# Patient Record
Sex: Male | Born: 1945
Health system: Southern US, Community
[De-identification: ages and names within clinical notes are randomized; demographics above are authoritative.]

## PROBLEM LIST (undated history)

## (undated) DIAGNOSIS — E119 Type 2 diabetes mellitus without complications: Secondary | ICD-10-CM

## (undated) DIAGNOSIS — J449 Chronic obstructive pulmonary disease, unspecified: Secondary | ICD-10-CM

## (undated) DIAGNOSIS — E1142 Type 2 diabetes mellitus with diabetic polyneuropathy: Secondary | ICD-10-CM

## (undated) DIAGNOSIS — Z959 Presence of cardiac and vascular implant and graft, unspecified: Secondary | ICD-10-CM

## (undated) DIAGNOSIS — I209 Angina pectoris, unspecified: Secondary | ICD-10-CM

## (undated) DIAGNOSIS — I340 Nonrheumatic mitral (valve) insufficiency: Secondary | ICD-10-CM

## (undated) DIAGNOSIS — I739 Peripheral vascular disease, unspecified: Secondary | ICD-10-CM

## (undated) DIAGNOSIS — R011 Cardiac murmur, unspecified: Secondary | ICD-10-CM

## (undated) DIAGNOSIS — I219 Acute myocardial infarction, unspecified: Secondary | ICD-10-CM

## (undated) DIAGNOSIS — H269 Unspecified cataract: Secondary | ICD-10-CM

## (undated) DIAGNOSIS — E785 Hyperlipidemia, unspecified: Secondary | ICD-10-CM

## (undated) DIAGNOSIS — I2510 Atherosclerotic heart disease of native coronary artery without angina pectoris: Secondary | ICD-10-CM

## (undated) DIAGNOSIS — K219 Gastro-esophageal reflux disease without esophagitis: Secondary | ICD-10-CM

## (undated) DIAGNOSIS — N183 Chronic kidney disease, stage 3 (moderate): Secondary | ICD-10-CM

## (undated) DIAGNOSIS — I10 Essential (primary) hypertension: Secondary | ICD-10-CM

## (undated) DIAGNOSIS — B029 Zoster without complications: Secondary | ICD-10-CM

## (undated) HISTORY — DX: Nonrheumatic mitral (valve) insufficiency: I34.0

## (undated) HISTORY — PX: ANTERIOR CERVICAL DECOMP/DISCECTOMY FUSION: SHX1161

## (undated) HISTORY — DX: Type 2 diabetes mellitus with diabetic polyneuropathy: E11.42

## (undated) HISTORY — DX: Atherosclerotic heart disease of native coronary artery without angina pectoris: I25.10

## (undated) HISTORY — DX: Chronic obstructive pulmonary disease, unspecified: J44.9

## (undated) HISTORY — PX: CHOLECYSTECTOMY: SHX55

## (undated) HISTORY — PX: BACK SURGERY: SHX140

## (undated) HISTORY — DX: Zoster without complications: B02.9

## (undated) HISTORY — DX: Unspecified cataract: H26.9

## (undated) HISTORY — DX: Peripheral vascular disease, unspecified: I73.9

## (undated) HISTORY — DX: Hyperlipidemia, unspecified: E78.5

## (undated) HISTORY — DX: Chronic kidney disease, stage 3 (moderate): N18.3

---

## 1999-08-12 ENCOUNTER — Inpatient Hospital Stay (HOSPITAL_COMMUNITY): Admission: RE | Admit: 1999-08-12 | Discharge: 1999-08-15 | Payer: Self-pay | Admitting: Neurosurgery

## 1999-10-19 ENCOUNTER — Encounter: Admission: RE | Admit: 1999-10-19 | Discharge: 1999-10-19 | Payer: Self-pay | Admitting: Neurosurgery

## 1999-12-29 ENCOUNTER — Encounter: Payer: Self-pay | Admitting: Internal Medicine

## 1999-12-29 ENCOUNTER — Encounter: Admission: RE | Admit: 1999-12-29 | Discharge: 1999-12-29 | Payer: Self-pay | Admitting: Internal Medicine

## 2000-05-23 ENCOUNTER — Encounter: Admission: RE | Admit: 2000-05-23 | Discharge: 2000-05-23 | Payer: Self-pay | Admitting: Neurosurgery

## 2000-09-01 ENCOUNTER — Encounter: Admission: RE | Admit: 2000-09-01 | Discharge: 2000-09-01 | Payer: Self-pay | Admitting: Neurosurgery

## 2000-12-05 HISTORY — PX: LUMBAR FUSION: SHX111

## 2002-11-19 ENCOUNTER — Encounter: Payer: Self-pay | Admitting: Unknown Physician Specialty

## 2002-11-19 ENCOUNTER — Ambulatory Visit (HOSPITAL_COMMUNITY): Admission: RE | Admit: 2002-11-19 | Discharge: 2002-11-19 | Payer: Self-pay | Admitting: Unknown Physician Specialty

## 2002-12-05 HISTORY — PX: POSTERIOR FUSION CERVICAL SPINE: SUR628

## 2003-02-06 ENCOUNTER — Inpatient Hospital Stay (HOSPITAL_COMMUNITY): Admission: RE | Admit: 2003-02-06 | Discharge: 2003-02-08 | Payer: Self-pay | Admitting: Neurosurgery

## 2003-02-06 ENCOUNTER — Encounter: Payer: Self-pay | Admitting: General Surgery

## 2003-03-10 ENCOUNTER — Inpatient Hospital Stay (HOSPITAL_COMMUNITY): Admission: RE | Admit: 2003-03-10 | Discharge: 2003-04-02 | Payer: Self-pay | Admitting: Neurosurgery

## 2003-03-30 ENCOUNTER — Encounter: Payer: Self-pay | Admitting: Gastroenterology

## 2003-04-08 ENCOUNTER — Ambulatory Visit (HOSPITAL_COMMUNITY): Admission: RE | Admit: 2003-04-08 | Discharge: 2003-04-08 | Payer: Self-pay | Admitting: Neurosurgery

## 2003-05-07 ENCOUNTER — Ambulatory Visit (HOSPITAL_COMMUNITY): Admission: RE | Admit: 2003-05-07 | Discharge: 2003-05-07 | Payer: Self-pay | Admitting: Gastroenterology

## 2003-05-07 ENCOUNTER — Encounter: Payer: Self-pay | Admitting: Gastroenterology

## 2003-08-07 ENCOUNTER — Ambulatory Visit (HOSPITAL_COMMUNITY): Admission: RE | Admit: 2003-08-07 | Discharge: 2003-08-07 | Payer: Self-pay | Admitting: Gastroenterology

## 2003-08-07 ENCOUNTER — Encounter: Payer: Self-pay | Admitting: Gastroenterology

## 2005-03-14 ENCOUNTER — Encounter: Admission: RE | Admit: 2005-03-14 | Discharge: 2005-06-12 | Payer: Self-pay | Admitting: *Deleted

## 2006-02-06 ENCOUNTER — Emergency Department (HOSPITAL_COMMUNITY): Admission: EM | Admit: 2006-02-06 | Discharge: 2006-02-06 | Payer: Self-pay | Admitting: Emergency Medicine

## 2008-12-05 DIAGNOSIS — I219 Acute myocardial infarction, unspecified: Secondary | ICD-10-CM

## 2008-12-05 HISTORY — DX: Acute myocardial infarction, unspecified: I21.9

## 2009-04-04 HISTORY — PX: CORONARY ANGIOPLASTY WITH STENT PLACEMENT: SHX49

## 2009-04-11 ENCOUNTER — Ambulatory Visit: Payer: Self-pay | Admitting: Cardiology

## 2009-04-11 ENCOUNTER — Inpatient Hospital Stay (HOSPITAL_COMMUNITY): Admission: EM | Admit: 2009-04-11 | Discharge: 2009-04-14 | Payer: Self-pay | Admitting: Cardiology

## 2009-04-14 ENCOUNTER — Encounter: Payer: Self-pay | Admitting: Cardiology

## 2009-04-14 ENCOUNTER — Ambulatory Visit: Payer: Self-pay | Admitting: Vascular Surgery

## 2009-04-29 DIAGNOSIS — J449 Chronic obstructive pulmonary disease, unspecified: Secondary | ICD-10-CM

## 2009-04-29 DIAGNOSIS — J4489 Other specified chronic obstructive pulmonary disease: Secondary | ICD-10-CM | POA: Insufficient documentation

## 2009-04-30 ENCOUNTER — Ambulatory Visit: Payer: Self-pay | Admitting: Cardiovascular Disease

## 2009-04-30 ENCOUNTER — Encounter: Payer: Self-pay | Admitting: Nurse Practitioner

## 2009-04-30 DIAGNOSIS — Z789 Other specified health status: Secondary | ICD-10-CM

## 2009-04-30 DIAGNOSIS — F17200 Nicotine dependence, unspecified, uncomplicated: Secondary | ICD-10-CM

## 2009-04-30 DIAGNOSIS — I214 Non-ST elevation (NSTEMI) myocardial infarction: Secondary | ICD-10-CM | POA: Insufficient documentation

## 2009-05-06 ENCOUNTER — Ambulatory Visit: Payer: Self-pay | Admitting: Cardiology

## 2009-07-28 ENCOUNTER — Ambulatory Visit: Payer: Self-pay | Admitting: Cardiology

## 2009-08-04 ENCOUNTER — Ambulatory Visit: Payer: Self-pay | Admitting: Cardiology

## 2009-10-21 ENCOUNTER — Encounter (INDEPENDENT_AMBULATORY_CARE_PROVIDER_SITE_OTHER): Payer: Self-pay | Admitting: *Deleted

## 2009-11-06 ENCOUNTER — Encounter (INDEPENDENT_AMBULATORY_CARE_PROVIDER_SITE_OTHER): Payer: Self-pay | Admitting: *Deleted

## 2009-12-01 ENCOUNTER — Ambulatory Visit: Payer: Self-pay | Admitting: Cardiology

## 2009-12-11 ENCOUNTER — Ambulatory Visit: Payer: Self-pay | Admitting: Cardiology

## 2009-12-17 ENCOUNTER — Ambulatory Visit: Payer: Self-pay | Admitting: Cardiology

## 2009-12-22 ENCOUNTER — Encounter: Payer: Self-pay | Admitting: Cardiology

## 2009-12-22 LAB — CONVERTED CEMR LAB
ALT: 22 units/L (ref 0–53)
AST: 17 units/L (ref 0–37)
Albumin: 4.1 g/dL (ref 3.5–5.2)
Alkaline Phosphatase: 55 units/L (ref 39–117)
Cholesterol: 133 mg/dL (ref 0–200)
HDL: 35.9 mg/dL — ABNORMAL LOW (ref >39.00)
Total CHOL/HDL Ratio: 4
Triglycerides: 94 mg/dL (ref 0.0–149.0)

## 2010-01-05 HISTORY — PX: OTHER SURGICAL HISTORY: SHX169

## 2010-01-07 ENCOUNTER — Ambulatory Visit: Payer: Self-pay | Admitting: Cardiology

## 2010-01-07 ENCOUNTER — Encounter: Payer: Self-pay | Admitting: Cardiology

## 2010-03-02 ENCOUNTER — Encounter (INDEPENDENT_AMBULATORY_CARE_PROVIDER_SITE_OTHER): Payer: Self-pay | Admitting: *Deleted

## 2010-03-15 ENCOUNTER — Encounter (INDEPENDENT_AMBULATORY_CARE_PROVIDER_SITE_OTHER): Payer: Self-pay | Admitting: *Deleted

## 2010-06-15 ENCOUNTER — Encounter: Payer: Self-pay | Admitting: Cardiology

## 2010-11-04 HISTORY — PX: CARDIOVASCULAR STRESS TEST: SHX262

## 2010-11-08 ENCOUNTER — Ambulatory Visit: Payer: Self-pay | Admitting: Family Medicine

## 2010-11-08 DIAGNOSIS — J209 Acute bronchitis, unspecified: Secondary | ICD-10-CM | POA: Insufficient documentation

## 2010-11-08 DIAGNOSIS — M79609 Pain in unspecified limb: Secondary | ICD-10-CM | POA: Insufficient documentation

## 2010-11-09 ENCOUNTER — Encounter: Payer: Self-pay | Admitting: Gastroenterology

## 2010-11-09 ENCOUNTER — Telehealth (INDEPENDENT_AMBULATORY_CARE_PROVIDER_SITE_OTHER): Payer: Self-pay | Admitting: *Deleted

## 2010-11-09 ENCOUNTER — Encounter: Payer: Self-pay | Admitting: Family Medicine

## 2010-11-15 ENCOUNTER — Telehealth: Payer: Self-pay | Admitting: Cardiology

## 2010-11-15 ENCOUNTER — Telehealth: Payer: Self-pay | Admitting: Family Medicine

## 2010-11-19 ENCOUNTER — Ambulatory Visit: Payer: Self-pay | Admitting: Cardiology

## 2010-11-19 ENCOUNTER — Encounter: Payer: Self-pay | Admitting: Cardiology

## 2010-11-25 ENCOUNTER — Telehealth (INDEPENDENT_AMBULATORY_CARE_PROVIDER_SITE_OTHER): Payer: Self-pay | Admitting: Radiology

## 2010-11-30 ENCOUNTER — Encounter: Payer: Self-pay | Admitting: Cardiology

## 2010-11-30 ENCOUNTER — Encounter (HOSPITAL_COMMUNITY)
Admission: RE | Admit: 2010-11-30 | Discharge: 2011-01-04 | Payer: Self-pay | Source: Home / Self Care | Attending: Cardiology | Admitting: Cardiology

## 2010-11-30 ENCOUNTER — Ambulatory Visit: Payer: Self-pay

## 2010-12-05 HISTORY — PX: COLONOSCOPY: SHX174

## 2010-12-07 ENCOUNTER — Telehealth: Payer: Self-pay | Admitting: Family Medicine

## 2010-12-10 LAB — CONVERTED CEMR LAB
ALT: 15 units/L (ref 0–53)
Albumin: 4.2 g/dL (ref 3.5–5.2)
Cholesterol: 192 mg/dL (ref 0–200)
Eosinophils Relative: 1.9 % (ref 0.0–5.0)
GFR calc non Af Amer: 69.92 mL/min (ref >60.00)
HCT: 40.6 % (ref 39.0–52.0)
Hemoglobin: 13.8 g/dL (ref 13.0–17.0)
Hgb A1c MFr Bld: 8.5 % — ABNORMAL HIGH (ref 4.6–6.5)
LDL Cholesterol: 124 mg/dL — ABNORMAL HIGH (ref 0–99)
Lymphs Abs: 2 10*3/uL (ref 0.7–4.0)
Monocytes Relative: 7.1 % (ref 3.0–12.0)
Neutro Abs: 5 10*3/uL (ref 1.4–7.7)
Potassium: 4.9 meq/L (ref 3.5–5.1)
Sodium: 138 meq/L (ref 135–145)
Total Bilirubin: 1 mg/dL (ref 0.3–1.2)
VLDL: 33.6 mg/dL (ref 0.0–40.0)
WBC: 7.8 10*3/uL (ref 4.5–10.5)

## 2010-12-13 ENCOUNTER — Encounter (INDEPENDENT_AMBULATORY_CARE_PROVIDER_SITE_OTHER): Payer: Self-pay

## 2010-12-14 ENCOUNTER — Ambulatory Visit
Admission: RE | Admit: 2010-12-14 | Discharge: 2010-12-14 | Payer: Self-pay | Source: Home / Self Care | Attending: Gastroenterology | Admitting: Gastroenterology

## 2010-12-17 ENCOUNTER — Encounter: Payer: Self-pay | Admitting: Family Medicine

## 2010-12-28 ENCOUNTER — Ambulatory Visit
Admission: RE | Admit: 2010-12-28 | Discharge: 2010-12-28 | Payer: Self-pay | Source: Home / Self Care | Attending: Gastroenterology | Admitting: Gastroenterology

## 2010-12-28 ENCOUNTER — Encounter: Payer: Self-pay | Admitting: Gastroenterology

## 2010-12-28 LAB — HM COLONOSCOPY

## 2010-12-29 LAB — GLUCOSE, CAPILLARY
Glucose-Capillary: 154 mg/dL — ABNORMAL HIGH (ref 70–99)
Glucose-Capillary: 156 mg/dL — ABNORMAL HIGH (ref 70–99)

## 2011-01-04 NOTE — Progress Notes (Signed)
  Phone Note Other Incoming   Summary of Call: Please notify pt that his urine protein test yesterday was normal.  Also, tell him that I saw his labs from 10/14/10 and I recommend he try another cholesterol med since he couldn't tolerate his zocor.  I'll send rx for Welchol to his pharmacy and he should take it every day. Thanks Initial call taken by: Michell Heinrich M.D.,  November 09, 2010 1:39 PM  Follow-up for Phone Call        Will notify pt through a letter. Only have work number. Follow-up by: Josph Macho RMA,  November 09, 2010 2:05 PM

## 2011-01-04 NOTE — Miscellaneous (Signed)
  Clinical Lists Changes  Observations: Added new observation of RS STUDY: TRACER - study completion 01/07/10  (03/02/2010 12:15)      Research Study Name: TRACER - study completion 01/07/10

## 2011-01-04 NOTE — Letter (Signed)
Summary: Generic Letter  [facility] [address]   [place], [place] [address]   Phone: [phone]  Fax: [phone]     11/09/2010   Derrick [name] [address] [place], [place]  [address]  [place]  Dear [name],  We have tried to contact you but have been unsucessful. We would like you to know that your urine protein test yesterday was normal. Dr Milinda Cave would also like you to know that he saw your labs from 10/05/10 and would recommend you try another cholesterol medication since you couldn't tolerate the Zocor. He sent in an RX for Welchol to your pharmacy. You will need to take this everyday. I'm also sending you some information about the Welchol along with a savings card.  If you have any questions please don't hesitate to contact us at [phone]. Also please contact us with a current number to contact you.       Sincerely,   Josph Macho RMA

## 2011-01-04 NOTE — Letter (Signed)
Summary: lipid reminder  [facility], Main Office  [address]   [place], [place] [address]   Phone: [phone]  Fax: [phone]        June 15, 2010 MRN: [id number]    KIRT CHEW [address] [place], [place]  [address]    Dear Mr. COSENS,  Our records indicate it is time to check your cholesterol.  Please call our office to schedule an appt for labwork.  Please remember it is a fasting lab.      Sincerely,  Meredith Staggers, RN Willa Rough, MD This letter has been electronically signed by your physician.

## 2011-01-04 NOTE — Assessment & Plan Note (Signed)
Summary: New pt Medicare/dt   Vital Signs:  Patient profile:   65 year old male Height:      66 inches (167.64 cm) Weight:      128.75 pounds (58.52 kg) BMI:     20.86 O2 Sat:      95 % on Room air Temp:     98.7 degrees F (37.06 degrees C) oral Pulse rate:   89 / minute BP sitting:   111 / 74  (right arm) Cuff size:   regular  Vitals Entered By: Josph Macho RMA (November 08, 2010 1:01 PM)  O2 Flow:  Room air CC: Establish new patient/ CF Is Patient Diabetic? Yes   History of Present Illness: 65 y/o WM, here to establish care.   Last primary care f/u visit was nearly a year ago per his report.  His MD closed his office about 32mo ago (Dr. Day in [place], [place]) and he has not had some of his meds for months.  Of note, he self d/c'd his simvastatin due to myalgias.  These resolved with d/c of med. He's not checking glucoses at all but wife says he has a glucometer. Denies CP, SOB, or LE edema.  Had been on TRACER study drug with [facility] cardiology but now is finished with this.  Stopped plavix sometime after last cardiology f/u due to cost.  Has about 3 wks of URI, cough, describes "double sickening".  Some mild tightness in chest but no wheezing.  No fevers.  He feels like he's just not getting any better.  Still smoking 1 ppd of marlboro but wants to quit.  He was strongly encouraged to do so today while here.  Has chronic burning/aching pain in all of his toes for at least the last 5 yrs.  Apparently w/u at [facility] was unrevealing and he says 1/2 of a vicodin tab every evening takes the pain away.    Has a history of cervical and lumbar DDD and has had multiple surgeries on both areas, with stabilization hardware in c-spine.  Denies any neck, arm, leg, or back pain.  Med list reviewed: not taking naproxen, plavix, or simvastatin.  Lisinopril 2.5mg  is not on the list --his former primary MD added this since last update.   Medications Prior to Update: 1)  Metformin Hcl 500 Mg  Tabs (Metformin Hcl) .... Two Times A Day 2)  Glipizide 10 Mg Tabs (Glipizide) .... Daily 3)  Naprosyn 250 Mg Tabs (Naproxen) .... As Needed 4)  Vicodin 5-500 Mg Tabs (Hydrocodone-Acetaminophen) .... As Needed 5)  Tracer Study Drug 6)  Simvastatin 40 Mg Tabs (Simvastatin) .... Daily 7)  Plavix 75 Mg Tabs (Clopidogrel Bisulfate) .... Daily 8)  Aspirin 325 Mg  Tabs (Aspirin) .... Daily 9)  Nitrostat 0.4 Mg Subl (Nitroglycerin) .... As Needed  Current Medications (verified): 1)  Metformin Hcl 500 Mg Tabs (Metformin Hcl) .... Two Times A Day 2)  Glipizide 10 Mg Tabs (Glipizide) .... Daily 3)  Vicodin 5-500 Mg Tabs (Hydrocodone-Acetaminophen) .[name].. 1-2 Q 4 Hours As Needed 4)  Aspirin 325 Mg  Tabs (Aspirin) .... Daily 5)  Nitrostat 0.4 Mg Subl (Nitroglycerin) .... As Needed 6)  Lisinopril 2.5 Mg Tabs (Lisinopril) .... Once Daily 7)  Zithromax Z-Pak 250 Mg Tabs (Azithromycin) .... As Directed  Allergies (verified): No Known Drug Allergies  Past History:  Past Surgical History: L-spine surgery 2000 (Dr. Lovell Sheehan) C-spine surgery 2004 (Dr. Lovell Sheehan) --transient dysphagia requiring tube feeding after this procedure.  Family History: Reviewed  history from 04/29/2009 and no changes required. Family History of Coronary Artery Disease:   Social History: Reviewed history from 04/29/2009 and no changes required. Tobacco Use - Yes.  Alcohol Use - no Drug Use - no Married, has 3 grown children.  Works at [employer] in [place], [place].  Review of Systems  The patient denies anorexia, fever, weight loss, weight gain, vision loss, decreased hearing, hoarseness, chest pain, syncope, dyspnea on exertion, peripheral edema, prolonged cough, headaches, hemoptysis, abdominal pain, melena, hematochezia, severe indigestion/heartburn, hematuria, incontinence, genital sores, muscle weakness, suspicious skin lesions, transient blindness, difficulty walking, depression, unusual weight change,  abnormal bleeding, enlarged lymph nodes, angioedema, breast masses, and testicular masses.    Physical Exam  General:  VS: noted, all normal. Gen: Alert, well appearing, oriented x 4. HEENT: Scalp without lesions or hair loss.  Ears: EACs clear, normal epithelium.  TMs with good light reflex and landmarks bilaterally.  Eyes: no injection, icteris, swelling, or exudate.  EOMI, PERRLA. Nose: no drainage but mild mucosal injection noted.   Mouth: lips without lesion/swelling.  Oral mucosa pink and moist.  Dentition intact and without obvious caries or gingival swelling.  Oropharynx without erythema, exudate, or swelling.  Chest: symmetric expansion, with nonlabored respirations.  Clear and equal breath sounds in all lung fields.   Chest: symmetric expansion, with nonlabored respirations.  Clear and equal breath sounds in all lung fields.   Some post-exhalation coughing is noted.  CV: RRR, no m/r/g.  Peripheral pulses 2+/symmetric.    Impression & Recommendations:  Problem # 1:  BRONCHITIS, ACUTE (ICD-466.0) New patient. Has prolonged URI symptoms plus some bronchospasm, ongoing tobacco dependence. Encouraged smoking cessation, discussed nicotine replacement options today. Rx'd zithromax and gave ventolin HFA sample, 1-2 puffs q4h as needed.  His updated medication list for this problem includes:    Zithromax Z-pak 250 Mg Tabs (Azithromycin) .[name]... As directed  Problem # 2:  CORONARY ATHEROSCLEROSIS NATIVE CORONARY ARTERY (ICD-414.01) Doing well. Continue ASA 81-325mg  once daily. He'll f/u with his cardiologist soon and they'll decide on any need to restart plavix.  The following medications were removed from the medication list:    Plavix 75 Mg Tabs (Clopidogrel bisulfate) .[name]... Daily His updated medication list for this problem includes:    Aspirin 325 Mg Tabs (Aspirin) .[name]... Daily    Nitrostat 0.4 Mg Subl (Nitroglycerin) .[name]... As needed    Lisinopril 2.5 Mg Tabs (Lisinopril) .....  Once daily  Orders: T-Comprehensive Metabolic Panel [id number]) T-Lipid Profile [id number]) T-CBC w/Diff [id number]) T- Hemoglobin A1C ([id number])  Problem # 3:  DIABETES MELLITUS (ICD-250.00) Discussed need to monitor glucoses, recommended at least once daily fasting checks, bring in log of these to next visit.   In the meantime, will repeat urine protein-creatinine ratio today and gave order for him to get fasting lipids, CBC, CMET, and HbA1c at [facility] at his earliest convenience.  If lipids in need of med treatment, in light of his myalgias with simvastatin, will consider welchol. He is due for routine diabetic retinopathy screening so he'll call his eye MD to make appt. We did discuss the need for colon cancer screening, so we'll facilitate this referral. Of note, pt declined flu vaccine today.  His updated medication list for this problem includes:    Metformin Hcl 500 Mg Tabs (Metformin hcl) .[name]..[name] Two times a day    Glipizide 10 Mg Tabs (Glipizide) .[name]... Daily    Aspirin 325 Mg Tabs (Aspirin) .[name]... Daily    Lisinopril 2.5  Mg Tabs (Lisinopril) ..... Once daily  Orders: T-Urine Microalbumin w/creat. ratio [id number]) T-Comprehensive Metabolic Panel [id number]) T-Lipid Profile [id number]) T-CBC w/Diff [id number]) T- Hemoglobin A1C ([id number])  Problem # 4:  PAIN IN SOFT TISSUES OF LIMB (ICD-729.5) Suspicous for diabetic neuropathy.   Per pt report, w/u unrevealing at [facility].  Since 1/2 of vicodin 5/500 once daily is very effective for him we'll continue this.  Complete Medication List: 1)  Metformin Hcl 500 Mg Tabs (Metformin hcl) .... Two times a day 2)  Glipizide 10 Mg Tabs (Glipizide) .... Daily 3)  Vicodin 5-500 Mg Tabs (Hydrocodone-acetaminophen) .[name].. 1-2 q 4 hours as needed 4)  Aspirin 325 Mg Tabs (Aspirin) .... Daily 5)  Nitrostat 0.4 Mg Subl (Nitroglycerin) .... As needed 6)  Lisinopril 2.5 Mg Tabs (Lisinopril) .... Once daily 7)  Zithromax  Z-pak 250 Mg Tabs (Azithromycin) .... As directed  Patient Instructions: 1)  Please schedule a follow-up appointment in 4 months .  2)  Check glucose at least once daily in the morning before eating. Prescriptions: LISINOPRIL 2.5 MG TABS (LISINOPRIL) once daily  #30 x 5   Entered and Authorized by:   Michell Heinrich M.D.   Signed by:   Michell Heinrich M.D. on 11/08/2010   Method used:   Electronically to        [facility] [address] (retail)       [address]       [place], [place]  [address]       Ph: [phone]       Fax: [phone]   RxID:   [id number] GLIPIZIDE 10 MG TABS (GLIPIZIDE) daily  #30 x 5   Entered and Authorized by:   Michell Heinrich M.D.   Signed by:   Michell Heinrich M.D. on 11/08/2010   Method used:   Electronically to        [facility] [address] (retail)       [address]       [place], [place]  [address]       Ph: [phone]       Fax: [phone]   RxID:   [id number] METFORMIN HCL 500 MG TABS (METFORMIN HCL) two times a day  #60 x 5   Entered and Authorized by:   Michell Heinrich M.D.   Signed by:   Michell Heinrich M.D. on 11/08/2010   Method used:   Electronically to        [facility] [address] (retail)       [address]       [place], [place]  [address]       Ph: [phone]       Fax: [phone]   RxID:   [id number] ZITHROMAX Z-PAK 250 MG TABS (AZITHROMYCIN) as directed  #1 pack x 0   Entered and Authorized by:   Michell Heinrich M.D.   Signed by:   Michell Heinrich M.D. on 11/08/2010   Method used:   Faxed to ...       [facility] [address] (retail)       [address]       [place], [place]  [address]       Ph: [phone]  Fax: [phone]   RxID:   [id number] VICODIN 5-500 MG TABS (HYDROCODONE-ACETAMINOPHEN) as needed  #60 x 1   Entered and Authorized by:   Michell Heinrich M.D.   Signed by:   Michell Heinrich M.D. on 11/08/2010   Method used:   Print then Give to Patient   RxID:   [id number]    Orders Added: 1)  T-Urine Microalbumin w/creat. ratio [[id number]] 2)  T-Comprehensive Metabolic Panel [[id number]] 3)  T-Lipid Profile [[id number]] 4)  T-CBC w/Diff [[id number]] 5)  T- Hemoglobin A1C [83036-23375]     Appended Document: New pt Medicare/dt    Clinical Lists Changes  Problems: Added new problem of SCREENING, COLON CANCER (ICD-V76.51) Orders: Added new Referral order of Gastroenterology Referral (GI) - Signed Added new Service order of New Patient Level IV ([address]) - Signed

## 2011-01-04 NOTE — Assessment & Plan Note (Signed)
Summary: PER CHECK OUT/SF   Visit Type:  Follow-up Primary Provider:  Alfredia Client, MD  CC:  CAD.  History of Present Illness: Patient is seen for followup of coronary artery disease.  He had a non-STEMI May, 2010.  He is done well since then.  He has very slight chest discomfort which does not sound like angina.  Unfortunately he continues to smoke.  He said he has cut back to one half pack per day.  I formally counseled him about this and again incurred to stop smoking.    Current Medications (verified): 1)  Metformin Hcl 500 Mg Tabs (Metformin Hcl) .... Two Times A Day 2)  Glipizide 10 Mg Tabs (Glipizide) .... Daily 3)  Naprosyn 250 Mg Tabs (Naproxen) .... As Needed 4)  Vicodin 5-500 Mg Tabs (Hydrocodone-Acetaminophen) .... As Needed 5)  Tracer Study Drug 6)  Simvastatin 40 Mg Tabs (Simvastatin) .... Daily 7)  Plavix 75 Mg Tabs (Clopidogrel Bisulfate) .... Daily 8)  Aspirin 325 Mg  Tabs (Aspirin) .... Daily 9)  Nitrostat 0.4 Mg Subl (Nitroglycerin) .... As Needed  Allergies (verified): No Known Drug Allergies  Past History:  Past Medical History: Last updated: 04/30/2009 HYPERLIPIDEMIA (ICD-272.4) TOBACCO ABUSE (ICD-305.1) CORONARY ATHEROSCLEROSIS NATIVE CORONARY ARTERY (ICD-414.01)      A. S/P NSTEMI 04/11/2009      B. S/P CATH/PCI/DES (XIENCE) TO THE LAD. COPD (ICD-496) DIABETES MELLITUS (ICD-250.00)  Review of Systems       Patient denies fever, chills, headache, sweats, rash, change in vision, change in hearing, shortness of breath, nausea vomiting, urinary symptoms, musculoskeletal problems.  All other systems are reviewed and are negative.  Vital Signs:  Patient profile:   65 year old male Height:      66 inches Weight:      135 pounds BMI:     21.87 Pulse rate:   75 / minute BP sitting:   142 / 72  (left arm) Cuff size:   regular  Vitals Entered By: Hardin Negus, RMA (December 11, 2009 9:30 AM)  Physical Exam  General:  patient is stable in  general. Eyes:  no xanthelasma. Neck:  no jugular venous distention. Lungs:  lungs reveal a few end expiratory wheezes. Heart:  cardiac exam reveals S1 and S2.  No clicks or significant murmurs. Abdomen:  abdomen is soft. Extremities:  no peripheral edema. Psych:  patient is oriented to person time and place.  Affect is normal.   Impression & Recommendations:  Problem # 1:  HYPERLIPIDEMIA (ICD-272.4)  His updated medication list for this problem includes:    Simvastatin 40 Mg Tabs (Simvastatin) .[name]... Daily Lipids are treated.  No change in therapy.  We'll be sure that he has followup labs.  Problem # 2:  TOBACCO ABUSE (ICD-305.1) I counseled the patient to stop smoking.  He says he is trying.  Problem # 3:  CORONARY ATHEROSCLEROSIS NATIVE CORONARY ARTERY (ICD-414.01)  His updated medication list for this problem includes:    Plavix 75 Mg Tabs (Clopidogrel bisulfate) .[name]... Daily    Aspirin 325 Mg Tabs (Aspirin) .[name]... Daily    Nitrostat 0.4 Mg Subl (Nitroglycerin) .[name]... As needed  Orders: EKG w/ Interpretation (93[address])  Patient is not having significant ischemic pain.  EKG is reviewed by me.  It is done today.  There is normal sinus rhythm with no acute changes.Follow up will be in several months to reconsider the ongoing use of Plavix at that time.  Patient Instructions: 1)  Your physician recommends that  you return for a FASTING lipid and liver profile: next week, dx 414.01 2)  Follow up in June

## 2011-01-04 NOTE — Letter (Signed)
Summary: Pre Visit Letter Revised  [facility]  [address] [place], [place] [address]   Phone: [phone]  Fax: [phone]        11/09/2010 MRN: [id number]  Derrick [name] [address] [place], [place]  [address]  [place]             Procedure Date:  12-28-10 8:30am           Dr Russella Dar - Recall Colon   Welcome to the Gastroenterology Division at [facility].    You are scheduled to see a nurse for your pre-procedure visit on 12-14-09 at 1pm on the 3rd floor at [facility], [address]  We ask that you try to arrive at our office 15 minutes prior to your appointment time to allow for check-in.  Please take a minute to review the attached form.  If you answer "Yes" to one or more of the questions on the first page, we ask that you call the person listed at your earliest opportunity.  If you answer "No" to all of the questions, please complete the rest of the form and bring it to your appointment.    Your nurse visit will consist of discussing your medical and surgical history, your immediate family medical history, and your medications.   If you are unable to list all of your medications on the form, please bring the medication bottles to your appointment and we will list them.  We will need to be aware of both prescribed and over the counter drugs.  We will need to know exact dosage information as well.    Please be prepared to read and sign documents such as consent forms, a financial agreement, and acknowledgement forms.  If necessary, and with your consent, a friend or relative is welcome to sit-in on the nurse visit with you.  Please bring your insurance card so that we may make a copy of it.  If your insurance requires a referral to see a specialist, please bring your referral form from your primary care physician.  No co-pay is required for this nurse visit.     If you cannot keep your appointment, please call [phone] to cancel or reschedule prior to  your appointment date.  This allows [place] the opportunity to schedule an appointment for another patient in need of care.    Thank you for choosing [facility] for your medical needs.  We appreciate the opportunity to care for you.  Please visit [place] at our website  to learn more about our practice.  Sincerely, The Gastroenterology Division

## 2011-01-04 NOTE — Miscellaneous (Signed)
  Clinical Lists Changes  Medications: Added new medication of WELCHOL 625 MG TABS (COLESEVELAM HCL) 3 tabs by mouth two times a day with a meal - Signed Rx of WELCHOL 625 MG TABS (COLESEVELAM HCL) 3 tabs by mouth two times a day with a meal;  #180 x 6;  Signed;  Entered by: Michell Heinrich M.D.;  Authorized by: Michell Heinrich M.D.;  Method used: Electronically to [facility] [address], [place], [place], [place]  [address], Ph: [phone], Fax: [phone]    Prescriptions: WELCHOL 625 MG TABS (COLESEVELAM HCL) 3 tabs by mouth two times a day with a meal  #180 x 6   Entered and Authorized by:   Michell Heinrich M.D.   Signed by:   Michell Heinrich M.D. on 11/09/2010   Method used:   Electronically to        [facility] [address] (retail)       [address]       [place], [place]  [address]       Ph: [phone]       Fax: [phone]   RxID:   [id number]

## 2011-01-04 NOTE — Letter (Signed)
Summary: Custom - Lipid  [facility], Main Office  [address]   [place], [place] [address]   Phone: [phone]  Fax: [phone]     December 22, 2009 MRN: [id number]   Derrick [name] [address] [place], [place]  [address]   Dear Derrick [name],  We have reviewed your cholesterol results.  They are as follows:     Total Cholesterol:    133 (Desirable: less than 200)       HDL  Cholesterol:     35.90  (Desirable: greater than 40 for men and 50 for women)       LDL Cholesterol:       78  (Desirable: less than 100 for low risk and less than 70 for moderate to high risk)       Triglycerides:       94.0  (Desirable: less than 150)  Our recommendations include:  Looks good, continue current medications   Call our office at the number listed above if you have any questions.  Lowering your LDL cholesterol is important, but it is only one of a large number of "risk factors" that may indicate that you are at risk for heart disease, stroke or other complications of hardening of the arteries.  Other risk factors include:   A.  Cigarette Smoking* B.  High Blood Pressure* C.  Obesity* D.   Low HDL Cholesterol (see yours above)* E.   Diabetes Mellitus (higher risk if your is uncontrolled) F.  Family history of premature heart disease G.  Previous history of stroke or cardiovascular disease    *These are risk factors YOU HAVE CONTROL OVER.  For more information, visit .  There is now evidence that lowering the TOTAL CHOLESTEROL AND LDL CHOLESTEROL can reduce the risk of heart disease.  The American Heart Association recommends the following guidelines for the treatment of elevated cholesterol:  1.  If there is now current heart disease and less than two risk factors, TOTAL CHOLESTEROL should be less than 200 and LDL CHOLESTEROL should be less than 100. 2.  If there is current heart disease or two or more risk factors, TOTAL CHOLESTEROL should be less than 200 and LDL  CHOLESTEROL should be less than 70.  A diet low in cholesterol, saturated fat, and calories is the cornerstone of treatment for elevated cholesterol.  Cessation of smoking and exercise are also important in the management of elevated cholesterol and preventing vascular disease.  Studies have shown that 30 to 60 minutes of physical activity most days can help lower blood pressure, lower cholesterol, and keep your weight at a healthy level.  Drug therapy is used when cholesterol levels do not respond to therapeutic lifestyle changes (smoking cessation, diet, and exercise) and remains unacceptably high.  If medication is started, it is important to have you levels checked periodically to evaluate the need for further treatment options.  Thank you,    [facility] Team

## 2011-01-04 NOTE — Miscellaneous (Signed)
  Clinical Lists Changes  Observations: Added new observation of PAST SURG HX: L-spine surgery 2000 (Dr. Lovell Sheehan) C-spine surgery 2004 (Dr. Lovell Sheehan) --transient dysphagia requiring tube feeding after this procedure. 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 Lovell Sheehan) C-spine surgery 2004 (Dr. Lovell Sheehan) --transient dysphagia requiring tube feeding after this procedure. 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

## 2011-01-04 NOTE — Letter (Signed)
Summary: Appointment - Reminder 2  [facility], Main Office  [address]   [place], [place] [address]   Phone: [phone]  Fax: [phone]     March 15, 2010 MRN: [id number]   Derrick [name] [address] [place], [place]  [address]   Dear [name],  Our records indicate that it is time to schedule a follow-up appointment with Dr. Myrtis Ser. It is very important that we reach you to schedule this appointment. We look forward to participating in your health care needs. Please contact us at the number listed above at your earliest convenience to schedule your appointment.  If you are unable to make an appointment at this time, give [place] a call so we can update our records.     Sincerely,   Glass blower/designer

## 2011-01-05 ENCOUNTER — Telehealth: Payer: Self-pay | Admitting: Family Medicine

## 2011-01-06 NOTE — Medication Information (Signed)
Summary: [employer]  [employer]   Imported By: Lester [place] 12/02/2010 08:00:21  _____________________________________________________________________  External Attachment:    Type:   Image     Comment:   External Document

## 2011-01-06 NOTE — Progress Notes (Signed)
Summary: tal to nurse (chest pain)  Phone Note From Other Clinic Call back at Home Phone [phone]   Caller: nurse Erie Noe Summary of Call: Per Erie Noe from [place] [facility]. States pt has been having chest pain off and on since Thanksgiving. Pt is at home now and called and wanted to see Primary Care dr. PCP feels pt needs to see cardiologist. pain was in center of chest but now is to the side. Please call pt and discuss what is going on with pt and to see if he needs to be seen or sent to ER.  Initial call taken by: Edman Circle,  November 15, 2010 9:30 AM  Follow-up for Phone Call        only have work # listed for pt, they state he is gone to lunch, will attempt to call him back later Meredith Staggers, RN  November 15, 2010 11:32 AM   pt home # was added have left mess there for pt to call back, have also contacted his work again and they state he is gone for the day Meredith Staggers, RN  November 15, 2010 5:01 PM   Additional Follow-up for Phone Call Additional follow up Details #1::        spoke w/pts wife appt sch for Fri w/Dr Boykin Nearing, RN  November 16, 2010 9:44 AM

## 2011-01-06 NOTE — Progress Notes (Signed)
Summary: HGB A1C results  Phone Note Other Incoming   Summary of Call: Please notify pt that his HbA1c was up (8.5%).  I recommend he increase his metformin to 1000 mg twice daily.  Finish up the 500 mg tabs by taking 2 tabs two times a day.  I'll send eRx for the 1000 mg tabs to his pharmacy.  Continue taking all other meds.  F/u as scheduled (should be 3-4 months from now) Initial call taken by: Michell Heinrich M.D.,  December 07, 2010 8:21 AM  Follow-up for Phone Call        I spoke to pt and notified to begin taking 1000mg  two times a day.  Pt agreeable.  Pt states that he feels "run down" and "dizzy-headed" for about one hour after taking metformin.  Pt takes ASA, but no other meds with metformin.  Unsure if pt is taking on full or empty stomach.  Pt would like to know if there is anything he can do to prevent these symptoms after taking metformin.  Please advise. Follow-up by: Francee Piccolo CMA Duncan Dull),  December 07, 2010 10:57 AM  Additional Follow-up for Phone Call Additional follow up Details #1::        Try taking with food every time, and he's still having the symptoms or if the increased dose worsens the symptoms, then he should go 2-3 days without metformin at all.  If the symptoms go away when he stops it, we'll rx a different med altogether. Additional Follow-up by: Michell Heinrich M.D.,  December 07, 2010 11:30 AM    Additional Follow-up for Phone Call Additional follow up Details #2::    pt notified of above and is agreeable. Follow-up by: Francee Piccolo CMA Duncan Dull),  December 07, 2010 2:24 PM

## 2011-01-06 NOTE — Procedures (Signed)
Summary: Colonoscopy  Patient: Derrick [name] Note: All result statuses are Final unless otherwise noted.  Tests: (1) Colonoscopy (COL)   COL Colonoscopy           DONE     [facility]     [address]     [place], [place]  [address]           COLONOSCOPY PROCEDURE REPORT     PATIENT:  Derrick, [name]  MR#:  [id number]     BIRTHDATE:  [birth year], 64 yrs. old  GENDER:  male     ENDOSCOPIST:  Judie Petit T. Russella Dar, MD, [facility]           PROCEDURE DATE:  12/28/2010     PROCEDURE:  Colonoscopy 84696     ASA CLASS:  Class II     INDICATIONS:  1) Routine Risk Screening     MEDICATIONS:   Fentanyl 50 mcg IV, Versed 5 mg IV     DESCRIPTION OF PROCEDURE:   After the risks benefits and     alternatives of the procedure were thoroughly explained, informed     consent was obtained.  Digital rectal exam was performed and     revealed no abnormalities.   The LB [id number] endoscope     was introduced through the anus and advanced to the cecum, which     was identified by both the appendix and ileocecal valve, without     limitations.  The quality of the prep was excellent, using     MoviPrep.  The instrument was then slowly withdrawn as the colon     was fully examined.     <<PROCEDUREIMAGES>>     FINDINGS:  A normal appearing cecum, ileocecal valve, and     appendiceal orifice were identified. The ascending, hepatic     flexure, transverse, splenic flexure, descending, sigmoid colon,     and rectum appeared unremarkable.   Retroflexed views in the     rectum revealed no abnormalities. The time to cecum =  2  minutes.     The scope was then withdrawn (time =  9.67  min) from the patient     and the procedure completed.     COMPLICATIONS:  None           ENDOSCOPIC IMPRESSION:     1) Normal colon           RECOMMENDATIONS:     1) Continue current colorectal screening for "routine risk"     patients with a colonoscopy in 10 years.           Venita Lick. Russella Dar, MD, [facility]         CC: Nicoletta Ba, MD           n.     Rosalie DoctorVenita Lick. Tiffney Haughton at 12/28/2010 08:53 AM           Ronny Flurry, [id number]  Note: An exclamation mark (!) indicates a result that was not dispersed into the flowsheet. Document Creation Date: 12/28/2010 8:54 AM _______________________________________________________________________  (1) Order result status: Final Collection or observation date-time: 12/28/2010 08:50 Requested date-time:  Receipt date-time:  Reported date-time:  Referring Physician:   Ordering Physician: Claudette Head [phone]) Specimen Source:  Source: Launa Grill Order Number: [id number] Lab site:   Appended Document: Colonoscopy    Clinical Lists Changes  Observations: Added new observation of COLONNXTDUE: 12/2020 (12/28/2010 15:09)

## 2011-01-06 NOTE — Progress Notes (Signed)
Summary: Chest Pain  ---- Converted from flag ---- ---- 11/15/2010 9:31 AM, Elizebeth Brooking McGowen M.D. wrote: Needs to f/u with Dr. Myrtis Ser ASAP.  He also has the option of coming in to see me in the meantime, and remind him that he can ALWAYS go to the ED if his chest pain is getting worse.  If his wife wants me to I'll call and talk to him.  Let me know.  ---- 11/15/2010 9:20 AM, Georga Bora wrote: Patient has had chest pains on and off since Thanksgiving. ------------------------------  Phone Note Other Incoming   Summary of Call: I called [facility] to schedule patient appointment. They will call patient today. Patient's wife was informed that Cardiology will call her home to speak with her husband (Patient). Initial call taken by: Georga Bora,  November 15, 2010 11:01 AM

## 2011-01-06 NOTE — Progress Notes (Signed)
Summary: nuc pre-procedure  Phone Note Outgoing Call   Call placed by: Domenic Polite, CNMT,  November 25, 2010 2:57 PM Call placed to: Patient Reason for Call: Confirm/change Appt Summary of Call: Reviewed information on Myoview Information Sheet (see scanned document for further details).  Spoke with patient.      Nuclear Med Background Indications for Stress Test: Evaluation for Ischemia, Stent Patency   History: COPD, Heart Catheterization, Myocardial Infarction, Stents  History Comments: 5/10 MI -nstemi /cath /stent LAD  Symptoms: Chest Pain    Nuclear Pre-Procedure Cardiac Risk Factors: Lipids, NIDDM, Smoker Height (in): 66

## 2011-01-06 NOTE — Assessment & Plan Note (Signed)
Summary: per spouse call pt having CP off and on/lg   Visit Type:  Follow-up Primary Provider:  Michell Heinrich M.D.  CC:  CAD.  History of Present Illness: the patient is seen for follow up of coronary artery disease.  I saw him last January, 2011.  He received daily drug-eluting stent to the LAD in May, 2010.  He is well.  He has some localized left anterior chest pain that is random.  This is not like his original severe sharp anterior chest pain with his MI.  He is active.  Is not having shortness of breath.  Current Medications (verified): 1)  Metformin Hcl 500 Mg Tabs (Metformin Hcl) .... Two Times A Day 2)  Glipizide 10 Mg Tabs (Glipizide) .... Daily 3)  Vicodin 5-500 Mg Tabs (Hydrocodone-Acetaminophen) .[name].. 1-2 Q 4 Hours As Needed 4)  Aspirin 325 Mg  Tabs (Aspirin) .... Daily 5)  Nitrostat 0.4 Mg Subl (Nitroglycerin) .... As Needed 6)  Lisinopril 2.5 Mg Tabs (Lisinopril) .... Once Daily 7)  Welchol 625 Mg Tabs (Colesevelam Hcl) .... 3 Tabs By Mouth Two Times A Day With A Meal  Allergies (verified): 1)  ! Simvastatin  Past History:  Past Medical History: HYPERLIPIDEMIA (ICD-272.4) TOBACCO ABUSE (ICD-305.1) CORONARY ATHEROSCLEROSIS NATIVE CORONARY ARTERY (ICD-414.01)      A. S/P NSTEMI 04/11/2009      B. S/P CATH/PCI/DES (XIENCE) TO THE LAD. COPD (ICD-496) DIABETES MELLITUS (ICD-250.00) Hip and calf pain with simvastatin..... improved when he stopped  Review of Systems       Patient denies fever, chills, headache, sweats, rash, change in vision, change in hearing, chest pain, cough, nausea vomiting, urinary symptoms.  All other systems are reviewed and are negative  Vital Signs:  Patient profile:   65 year old male Height:      66 inches Weight:      129 pounds BMI:     20.90 Pulse rate:   62 / minute BP sitting:   106 / 82  (left arm) Cuff size:   regular  Vitals Entered By: Hardin Negus, RMA (November 19, 2010 11:45 AM)  Physical Exam  General:   Patient is stable today. Head:  head is atraumatic. Eyes:  no xanthelasma. Neck:  no jugular venous distention. Chest Wall:  no chest wall tenderness. Lungs:  lungs are clear.  Respiratory effort is nonlabored. Heart:  cardiac exam reveals S1-S2.  No clicks or significant murmurs. Abdomen:  abdomen is soft. Msk:  no musculoskeletal deformities. Extremities:  no peripheral edema. Skin:  no skin rashes. Psych:  patient is oriented to person time and place.  Affect is normal.  He is here with his wife today.   Impression & Recommendations:  Problem # 1:  CORONARY ATHEROSCLEROSIS NATIVE CORONARY ARTERY (ICD-414.01)  His updated medication list for this problem includes:    Aspirin 325 Mg Tabs (Aspirin) .[name]... Daily    Nitrostat 0.4 Mg Subl (Nitroglycerin) .[name]... As needed    Lisinopril 2.5 Mg Tabs (Lisinopril) ..... Once daily  Orders: EKG w/ Interpretation (93000) Nuclear Stress Test (Nuc Stress Test) The patient has had some chest discomfort.  I am not completely convinced that it is ischemic in origin but he had an intervention in May, 2010.  We will proceed with an exercise nuclear test. EKG is done today and reviewed by me.  There is normal sinus rhythm.  There no acute changes.  Problem # 2:  PAIN IN SOFT TISSUES OF LIMB (ICD-729.5) Is thought that this  was from simvastatin.  This is improved.  It is appropriate to try a different statin carefully  Problem # 3:  HYPERLIPIDEMIA (ICD-272.4)  The following medications were removed from the medication list:    Welchol 625 Mg Tabs (Colesevelam hcl) .Marland KitchenMarland KitchenMarland KitchenMarland Kitchen 3 tabs by mouth two times a day with a meal His updated medication list for this problem includes:    Pravachol 20 Mg Tabs (Pravastatin sodium) .[name]... Take 1 tablet every evening The patient had symptoms from simvastatin.  I feel it is appropriate to give him a trial of Pravachol to see how he feels.  If he does not do well with others statins, then other therapies can be  considered.  Orders: TLB-BMP (Basic Metabolic Panel-BMET) (80048-METABOL) TLB-CBC Platelet - w/Differential (85025-CBCD) TLB-Lipid Panel (80061-LIPID) TLB-A1C / Hgb A1C (Glycohemoglobin) (83036-A1C) TLB-Hepatic/Liver Function Pnl (80076-HEPATIC)  Problem # 4:  TOBACCO ABUSE (ICD-305.1) Patient continues to smoke.  He is counseled to stop.  Patient Instructions: 1)  Your physician recommends that you schedule a follow-up appointment in: 8 weeks 2)  Your physician has requested that you have a dobutamine myoview.  For further information please visit [email or web address]  Please follow instruction sheet, as given. 3)  Your physician has requested that you have an exercise stress myoview.  For further information please visit [email or web address]  Please follow instruction sheet, as given. Prescriptions: PRAVACHOL 20 MG TABS (PRAVASTATIN SODIUM) take 1 tablet every evening  #90 x 3   Entered by:   Ledon Snare, RN   Authorized by:   Talitha Givens, MD, [facility]   Signed by:   Ledon Snare, RN on 11/19/2010   Method used:   Electronically to        [facility]  [address] (retail)       [address]       [place], [place]  [address]       Ph: [phone]       Fax: [phone]   RxID:   [id number]   Appended Document: per spouse call pt having CP off and on/lg He should not be taking Welchol while he is trying Pravachol.  Appended Document: per spouse call pt having CP off and on/lg pt has stopped his Welchol

## 2011-01-06 NOTE — Letter (Signed)
Summary: Moviprep Instructions  [facility]  [address]   [place], [place] [address]   Phone: [phone]  Fax: [phone]       Derrick [name]    02/04/1946    MRN: [id number]        Procedure Day [name]: Tuesday, 12-28-10     Arrival Time: 7:30 a.m.     Procedure Time: 8:30 a.m.     Location of Procedure:                    x  [facility] (4th Floor)                        PREPARATION FOR COLONOSCOPY WITH MOVIPREP   Starting 5 days prior to your procedure 12-23-10 do not eat nuts, seeds, popcorn, corn, beans, peas,  salads, or any raw vegetables.  Do not take any fiber supplements (e.g. Metamucil, Citrucel, and Benefiber).  THE DAY BEFORE YOUR PROCEDURE         DATE: 12-27-10  DAY: Monday  1.  Drink clear liquids the entire day-NO SOLID FOOD  2.  Do not drink anything colored red or purple.  Avoid juices with pulp.  No orange juice.  3.  Drink at least 64 oz. (8 glasses) of fluid/clear liquids during the day to prevent dehydration and help the prep work efficiently.  CLEAR LIQUIDS INCLUDE: Water Jello Ice Popsicles Tea (sugar ok, no milk/cream) Powdered fruit flavored drinks Coffee (sugar ok, no milk/cream) Gatorade Juice: apple, white grape, white cranberry  Lemonade Clear bullion, consomm, broth Carbonated beverages (any kind) Strained chicken noodle soup Hard Candy                             4.  In the morning, mix first dose of MoviPrep solution:    Empty 1 Pouch A and 1 Pouch B into the disposable container    Add lukewarm drinking water to the top line of the container. Mix to dissolve    Refrigerate (mixed solution should be used within 24 hrs)  5.  Begin drinking the prep at 5:00 p.m. The MoviPrep container is divided by 4 marks.   Every 15 minutes drink the solution down to the next mark (approximately 8 oz) until the full liter is complete.   6.  Follow completed prep with 16 oz of clear liquid of your choice  (Nothing red or purple).  Continue to drink clear liquids until bedtime.  7.  Before going to bed, mix second dose of MoviPrep solution:    Empty 1 Pouch A and 1 Pouch B into the disposable container    Add lukewarm drinking water to the top line of the container. Mix to dissolve    Refrigerate  THE DAY OF YOUR PROCEDURE      DATE: 12-28-10 DAY: Tuesday  Beginning at 3:30 a.m. (5 hours before procedure):         1. Every 15 minutes, drink the solution down to the next mark (approx 8 oz) until the full liter is complete.  2. Follow completed prep with 16 oz. of clear liquid of your choice.    3. You may drink clear liquids until 6:30 a.m. (2 HOURS BEFORE PROCEDURE).   MEDICATION INSTRUCTIONS  Unless otherwise instructed, you should take regular prescription medications with a small sip of water   as early as possible the morning of your  procedure.        OTHER INSTRUCTIONS  You will need a responsible adult at least 66 years of age to accompany you and drive you home.   This person must remain in the waiting room during your procedure.  Wear loose fitting clothing that is easily removed.  Leave jewelry and other valuables at home.  However, you may wish to bring a book to read or  an iPod/MP3 player to listen to music as you wait for your procedure to start.  Remove all body piercing jewelry and leave at home.  Total time from sign-in until discharge is approximately 2-3 hours.  You should go home directly after your procedure and rest.  You can resume normal activities the  day after your procedure.  The day of your procedure you should not:   Drive   Make legal decisions   Operate machinery   Drink alcohol   Return to work  You will receive specific instructions about eating, activities and medications before you leave.    The above instructions have been reviewed and explained to me by   Doristine Church RN II  December 14, 2010 1:19 PM     I fully  understand and can verbalize these instructions _____________________________ Date _________

## 2011-01-06 NOTE — Assessment & Plan Note (Signed)
Summary: Cardiology Nuclear Testing  Nuclear Med Background Indications for Stress Test: Evaluation for Ischemia, Stent Patency   History: COPD, Heart Catheterization, Myocardial Infarction, Stents  History Comments: 5/10 NSTEMI>stent-LAD, with residual moderate CAD  Symptoms: Chest Pain, Chest Pressure, DOE  Symptoms Comments: Last episode of CP:2 days ago.   Nuclear Pre-Procedure Cardiac Risk Factors: Family History - CAD, Lipids, NIDDM, Smoker Caffeine/Decaff Intake: none NPO After: 8:00 PM Lungs: Clear.  O2 Sat 98% on RA. IV 0.9% NS with Angio Cath: 22g     IV Site: R Forearm IV Started by: Cathlyn Parsons, RN Chest Size (in) 32     Height (in): 66 Weight (lb): 128 BMI: 20.73  Nuclear Med Study 1 or 2 day study:  1 day     Stress Test Type:  Treadmill/Lexiscan Reading MD:  Willa Rough, MD     Referring MD:  Zackery Barefoot, MD Resting Radionuclide:  Technetium 3m Tetrofosmin     Resting Radionuclide Dose:  11 mCi  Stress Radionuclide:  Technetium 19m Tetrofosmin     Stress Radionuclide Dose:  33 mCi   Stress Protocol Exercise Time (min):  2:00 min     Max HR:  114 bpm     Predicted Max HR:  155 bpm  Max Systolic BP: 124 mm Hg     Percent Max HR:  73.55 %Rate Pressure Product:  [address]  Lexiscan: 0.4 mg   Stress Test Technologist:  Rea College, CMA-N     Nuclear Technologist:  Doyne Keel, CNMT  Rest Procedure  Myocardial perfusion imaging was performed at rest 45 minutes following the intravenous administration of Technetium 3m Tetrofosmin.  Stress Procedure  The patient received IV Lexiscan 0.4 mg over 15-seconds with concurrent low level exercise and then Technetium 25m Tetrofosmin was injected at 30-seconds.  There were nonspecific ST-T wave changes and occasional PVC's with infusion.  Quantitative spect images were obtained after a 45 minute delay.  QPS Raw Data Images:  Patient motion noted; appropriate software correction applied. Stress Images:  Normal  homogeneous uptake in all areas of the myocardium. Rest Images:  Normal homogeneous uptake in all areas of the myocardium. Subtraction (SDS):  No evidence of ischemia. Transient Ischemic Dilatation:  1.15  (Normal <1.22)  Lung/Heart Ratio:  0.26  (Normal <0.45)  Quantitative Gated Spect Images QGS EDV:  86 ml QGS ESV:  35 ml QGS EF:  60 % QGS cine images:  Normal motion  Findings Normal nuclear study      Overall Impression  Exercise Capacity: Lexiscan with no exercise. BP Response: Normal blood pressure response. Clinical Symptoms: SOB ECG Impression: No significant ST segment change suggestive of ischemia. Overall Impression: Normal stress nuclear study.  Appended Document: Cardiology Nuclear Testing Good  Appended Document: Cardiology Nuclear Testing left message of normal results

## 2011-01-06 NOTE — Miscellaneous (Signed)
Summary: [facility]  Clinical Lists Changes  Medications: Added new medication of MOVIPREP 100 GM  SOLR (PEG-KCL-NACL-NASULF-NA ASC-C) As per prep instructions. - Signed Rx of MOVIPREP 100 GM  SOLR (PEG-KCL-NACL-NASULF-NA ASC-C) As per prep instructions.;  #1 x 0;  Signed;  Entered by: Doristine Church RN II;  Authorized by: Meryl Dare MD [facility];  Method used: Electronically to [facility] [address], [place], [place], [place]  [address], Ph: [phone], Fax: [phone] Observations: Added new observation of ALLERGY REV: Done (12/14/2010 12:48)    Prescriptions: MOVIPREP 100 GM  SOLR (PEG-KCL-NACL-NASULF-NA ASC-C) As per prep instructions.  #1 x 0   Entered by:   Doristine Church RN II   Authorized by:   Meryl Dare MD [facility]   Signed by:   Doristine Church RN II on 12/14/2010   Method used:   Electronically to        [facility] (retail)       [address]       [place], [place]  [address]       Ph: [phone]       Fax: [phone]   RxID:   [id number]

## 2011-01-06 NOTE — Letter (Signed)
Summary: Diabetic Instructions  [facility]  [address]   [place], [place] [address]   Phone: [phone]  Fax: [phone]    Derrick [name] 1946/03/02 MRN: [id number]   x    ORAL DIABETIC MEDICATION INSTRUCTIONS  The day before your procedure:   Take your diabetic pill as you do normally  The day of your procedure:   Do not take your diabetic pill    We will check your blood sugar levels during the admission process and again in Recovery before discharging you home  ________________________________________________________________________       These instructions were reviewed with me by Doristine Church, RN on 12-14-10 @ [phone]

## 2011-01-12 NOTE — Progress Notes (Signed)
Summary: Metformin Rx  Phone Note Refill Request Call back at Home Phone [phone] Message from:  Patient on January 05, 2011 2:53 PM  Refills Requested: Medication #1:  METFORMIN HCL 500 MG TABS Take 2 tabs two times a day   Dosage confirmed as above?Dosage Confirmed   Brand Name Necessary? No   Supply Requested: 1 month  Method Requested: Electronic Initial call taken by: Lannette Donath,  January 05, 2011 2:55 PM  Follow-up for Phone Call        New rx for 1000mg  metformin tabs, take 1 two times a day ---eRx sent to pharmacy today. Follow-up by: Michell Heinrich M.D.,  January 06, 2011 8:42 AM  Additional Follow-up for Phone Call Additional follow up Details #1::        Pt advised Additional Follow-up by: Lannette Donath,  January 06, 2011 10:34 AM    New/Updated Medications: METFORMIN HCL 1000 MG TABS (METFORMIN HCL) 1 tab by mouth bid Prescriptions: METFORMIN HCL 1000 MG TABS (METFORMIN HCL) 1 tab by mouth bid  #60 x 6   Entered and Authorized by:   Michell Heinrich M.D.   Signed by:   Michell Heinrich M.D. on 01/06/2011   Method used:   Electronically to        [facility] [address] (retail)       [address]       [place], [place]  [address]       Ph: [phone]       Fax: [phone]   RxID:   [id number]

## 2011-01-25 ENCOUNTER — Encounter: Payer: Self-pay | Admitting: Cardiology

## 2011-01-25 DIAGNOSIS — I2510 Atherosclerotic heart disease of native coronary artery without angina pectoris: Secondary | ICD-10-CM | POA: Insufficient documentation

## 2011-01-26 ENCOUNTER — Ambulatory Visit (INDEPENDENT_AMBULATORY_CARE_PROVIDER_SITE_OTHER): Payer: Medicare Other | Admitting: Cardiology

## 2011-01-26 ENCOUNTER — Encounter: Payer: Self-pay | Admitting: Cardiology

## 2011-01-26 DIAGNOSIS — I2510 Atherosclerotic heart disease of native coronary artery without angina pectoris: Secondary | ICD-10-CM

## 2011-01-26 DIAGNOSIS — E782 Mixed hyperlipidemia: Secondary | ICD-10-CM

## 2011-02-01 NOTE — Assessment & Plan Note (Signed)
Summary: rov/sl/kl   Visit Type:  Follow-up Primary Provider:  Michell Heinrich M.D.  CC:  CAD.  History of Present Illness: The patient is seen for follow up of coronary artery disease.  We are pleased that he stop smoking.  He is using patches.  He seems motivated.  Is not having any further significant chest pain.  I saw him last November 19, 2010.  Decision that day was made to proceed with nuclear exercise testing.  This has been done and is normal.  Current Medications (verified): 1)  Metformin Hcl 1000 Mg Tabs (Metformin Hcl) .[name].. 1 Tab By Mouth Bid 2)  Glipizide 10 Mg Tabs (Glipizide) .... Daily 3)  Vicodin 5-500 Mg Tabs (Hydrocodone-Acetaminophen) .[name].. 1-2 Q 4 Hours As Needed 4)  Aspirin 325 Mg  Tabs (Aspirin) .... Daily 5)  Nitrostat 0.4 Mg Subl (Nitroglycerin) .... As Needed 6)  Lisinopril 2.5 Mg Tabs (Lisinopril) .... Once Daily 7)  Pravachol 20 Mg Tabs (Pravastatin Sodium) .... Take 1 Tablet Every Evening  Allergies (verified): 1)  ! Simvastatin  Past History:  Past Medical History: HYPERLIPIDEMIA (ICD-272.4) TOBACCO ABUSE (ICD-305.1)  stopped while using patches February, 2012 CAD   NSTEMI  May, 2010... DES to the LAD  /  nuclear December, 2011.. EF 60%.... no scar or ischemia EF 60%   nuclear December, 2011..( no echo data as of February, 2012) COPD (ICD-496) DIABETES MELLITUS (ICD-250.00) Hip and calf pain with simvastatin..... improved when he stopped  Review of Systems       Patient denies fever, chills, headache, sweats, rash, change in vision, change in hearing, chest pain, cough, nausea vomiting, urinary symptoms.  All other systems are reviewed and are negative.  Vital Signs:  Patient profile:   65 year old male Height:      66 inches Weight:      131 pounds BMI:     21.22 Pulse rate:   64 / minute BP sitting:   110 / 80  (left arm)  Vitals Entered By: Laurance Flatten CMA (January 26, 2011 8:25 AM)  Physical Exam  General:  patient is  quite stable. Eyes:    No xanthelasma. Neck:  no jugular venous extension. Lungs:  lungs are clear.  Respiratory effort is not labored. Heart:  cardiac exam reveals an S1-S2.  No clicks or significant murmurs. Abdomen:  abdomen is soft. Extremities:  no peripheral edema. Psych:  patient is oriented to person time and place.  Affect is normal.   Impression & Recommendations:  Problem # 1:  CAD (ICD-414.00) Coronary disease is stable.  Nuclear exercise study November 30, 2010 showed no scar or ischemia.  Ejection fraction was normal.  No further workup needed.  Problem # 2:  HYPERLIPIDEMIA (ICD-272.4) I'm pleased to see that the patient is tolerating low-dose Pravachol.  He seemed not to tolerate simvastatin.  I reminded him that Lipitor is now generic.  At some point this could be considered for more effective in lowering of his LDL.  He will review this with his primary physician.  Problem # 3:  TOBACCO ABUSE (ICD-305.1) The patient stop smoking January 06, 2011.  He is using patches.  Problem # 4:  DIABETES MELLITUS (ICD-250.00) He and his wife asked me questions about his diabetes.  I encouraged him to check his morning glucose and gather information about his levels before meals and review this with his primary physician.  Patient Instructions: 1)  Your physician recommends that you schedule a follow-up appointment  in: 1 year with Dr. Myrtis Ser 2)  Your physician recommends that you continue on your current medications as directed. Please refer to the Current Medication list given to you today.

## 2011-02-01 NOTE — Miscellaneous (Signed)
  Clinical Lists Changes  Problems: Removed problem of CHEST PAIN UNSPECIFIED (ICD-786.50) Removed problem of CORONARY ATHEROSCLEROSIS NATIVE CORONARY ARTERY (ICD-414.01) Added new problem of CAD (ICD-414.00) Observations: Added new observation of PAST MED HX: HYPERLIPIDEMIA (ICD-272.4) TOBACCO ABUSE (ICD-305.1) CAD   NSTEMI  May, 2010... DES to the LAD  /  nuclear December, 2011.. EF 60%.... no scar or ischemia EF 60%   nuclear December, 2011..( no echo data as of February, 2012) COPD (ICD-496) DIABETES MELLITUS (ICD-250.00) Hip and calf pain with simvastatin..... improved when he stopped   (01/25/2011 8:22) Added new observation of PRIMARY MD: Michell Heinrich M.D. (01/25/2011 8:22)       Past History:  Past Medical History: HYPERLIPIDEMIA (ICD-272.4) TOBACCO ABUSE (ICD-305.1) CAD   NSTEMI  May, 2010... DES to the LAD  /  nuclear December, 2011.. EF 60%.... no scar or ischemia EF 60%   nuclear December, 2011..( no echo data as of February, 2012) COPD (ICD-496) DIABETES MELLITUS (ICD-250.00) Hip and calf pain with simvastatin..... improved when he stopped

## 2011-02-03 HISTORY — PX: OTHER SURGICAL HISTORY: SHX169

## 2011-02-27 ENCOUNTER — Encounter: Payer: Self-pay | Admitting: Family Medicine

## 2011-03-09 ENCOUNTER — Encounter: Payer: Self-pay | Admitting: Family Medicine

## 2011-03-09 ENCOUNTER — Ambulatory Visit (INDEPENDENT_AMBULATORY_CARE_PROVIDER_SITE_OTHER): Payer: Medicare Other | Admitting: Family Medicine

## 2011-03-09 DIAGNOSIS — E785 Hyperlipidemia, unspecified: Secondary | ICD-10-CM

## 2011-03-09 DIAGNOSIS — E119 Type 2 diabetes mellitus without complications: Secondary | ICD-10-CM

## 2011-03-09 DIAGNOSIS — L570 Actinic keratosis: Secondary | ICD-10-CM

## 2011-03-09 DIAGNOSIS — Z23 Encounter for immunization: Secondary | ICD-10-CM

## 2011-03-09 MED ORDER — PRAVASTATIN SODIUM 20 MG PO TABS: 20.0000 mg | ORAL_TABLET | Freq: Every evening | ORAL | Status: DC

## 2011-03-09 MED ORDER — TETANUS-DIPHTH-ACELL PERTUSSIS 5-2.5-18.5 LF-MCG/0.5 IM SUSP
0.5000 mL | Freq: Once | INTRAMUSCULAR | Status: AC
  Administered 2011-03-09: 0.5 mL via INTRAMUSCULAR

## 2011-03-09 MED ORDER — METFORMIN HCL 500 MG PO TABS: 1000.0000 mg | ORAL_TABLET | Freq: Two times a day (BID) | ORAL | Status: DC

## 2011-03-09 MED ORDER — HYDROCODONE-ACETAMINOPHEN 5-500 MG PO TABS: 1.0000 | ORAL_TABLET | ORAL | Status: DC | PRN

## 2011-03-09 MED ORDER — DICLOFENAC SODIUM 3 % TD GEL: TRANSDERMAL | Status: DC

## 2011-03-09 NOTE — Progress Notes (Signed)
Subjective:    Patient ID: Derrick [name], male    DOB: 21-Apr-1946, 65 y.o.   MRN: [id number] Of note, PATIENT HAS QUIT SMOKING (NO CIGS IN OVER !)  Diabetes He presents for his follow-up diabetic visit. He has type 2 diabetes mellitus. There are no hypoglycemic associated symptoms. Pertinent negatives for hypoglycemia include no headaches. Associated symptoms include chest pain (Still with mild central NONEXERTIONAL CP, usually gets better when he keeps busy.  Stress testing neg .  No cough or dyspnea.). Pertinent negatives for diabetes include no fatigue and no weakness. Diabetic complications include peripheral neuropathy (has chronic/constant burning pain in all toes). Current diabetic treatment includes oral agent (monotherapy). He is compliant with treatment all of the time. Weight trend: up 3-5 lbs in last few months. He is following a generally unhealthy diet. He has not had a previous visit with a dietician. He never participates in exercise. There is no compliance with monitoring of blood glucose. An ACE inhibitor/angiotensin II receptor blocker is being taken. He does not see a podiatrist.Eye exam is current (last week: no D.R ([name] in [place])).  Hyperlipidemia This is a chronic problem. The problem is uncontrolled. Recent lipid tests were reviewed and are high. Exacerbating diseases include diabetes. Associated symptoms include chest pain (Still with mild central NONEXERTIONAL CP, usually gets better when he keeps busy.  Stress testing neg .  No cough or dyspnea.). Current antihyperlipidemic treatment includes statins (Pt. changed to pravastatin about 42mo ago b/c of intolerance to zocor and LDL up/HDL low.). There are no compliance problems (Tolerating pravastatin fine.  However, he is NOT eating a low chol/low fat diet.).    Also c/o spot on nose for > 48mo, says it was actually bigger at one point ?years? Ago and his MD at that time did cryotherapy to it.  It shrunk but never  completely went away.  It does not bleed or hurt.  Past Medical History  Diagnosis Date  . Hyperlipemia   . COPD (chronic obstructive pulmonary disease)   . Diabetes mellitus   . Coronary atherosclerosis of native coronary vessel     S/P NSTEMI 04/11/2009--DEL stent to LAD.  Nuclear stress test 11/2010 NEG, EF normal   Past Surgical History  Procedure Date  . Spine surgery     L-Spine 2000, C-Spine 2004  . Cryotherapy to ak lesion on nose 2/11  . Colonoscopy 12/2010    Normal screening colonoscopy; rpt 10 yrs   Outpatient Prescriptions Prior to Visit  Medication Sig Dispense Refill  . aspirin 81 MG EC tablet Take 81 mg by mouth daily.        [name] glipiZIDE (GLUCOTROL) 10 MG tablet Take 10 mg by mouth daily.        [name] HYDROcodone-acetaminophen (VICODIN) 5-500 MG per tablet Take 1-2 tablets by mouth every 4 (four) hours as needed.        [name] lisinopril (PRINIVIL,ZESTRIL) 2.5 MG tablet Take 2.5 mg by mouth daily.        . metFORMIN (GLUCOPHAGE) 500 MG tablet Take 1,000 mg by mouth 2 (two) times daily.        . nitroGLYCERIN (NITROSTAT) 0.4 MG SL tablet Place 0.4 mg under the tongue every 5 (five) minutes as needed.        . pravastatin (PRAVACHOL) 20 MG tablet Take 20 mg by mouth every evening.         Allergies  Allergen Reactions  . Simvastatin  Review of Systems  Constitutional: Negative for fever and fatigue.  HENT: Negative for congestion and sore throat.   Eyes: Negative for visual disturbance.  Respiratory: Negative for cough.   Cardiovascular: Positive for chest pain (Still with mild central NONEXERTIONAL CP, usually gets better when he keeps busy.  Stress testing neg .  No cough or dyspnea.).  Gastrointestinal: Negative for nausea and abdominal pain.  Genitourinary: Negative for dysuria.  Musculoskeletal: Negative for back pain and joint swelling.  Skin: Negative for rash.  Neurological: Negative for weakness and headaches.  Hematological: Negative for adenopathy.        Objective:   Physical Exam Blood pressure 117/75, pulse 73, temperature 97.6 F (36.4 C), temperature source Oral, height 5\' 6"  (1.676 m), weight 132 lb 12.8 oz (60.238 kg), SpO2 98.00%. Gen: Alert, well appearing, oriented x 4. Chest: symmetric expansion, nonlabored respirations.  Clear and equal breath sounds in all lung fields.   CV: RRR, no m/r/g.  Peripheral pulses 2+ and symmetric. EXT: no clubbing, cyanosis, or edema.  SKIN: bridge of nose with 2mm crusty pink, irregular papule.  No surrounding erythema, no tenderness.  No drainage. Diabetic foot exam done today: see charting for results (normal).  LABS:  Lab Results  Component Value Date   HGBA1C 8.5* 11/19/2010         Assessment & Plan:   DIABETES MELLITUS Needs to start monitoring!--Check fasting qAM and one 2h PP each day and bring to next visit in 17mo. Check HbA1c today to see how our last dose increase of metformin did (from 500bid to 1000 bid). Urine microalb is UTD, eye exam UTD, foot exam done today.  Referred for dietitian today for MNT--he has never been and needs to start doing dietary management for his medical issues. F/u 3 mo.  HYPERLIPIDEMIA Check FLP ASAP (he's not fasting today). Referred for MNT. Continue pravastatin, adjust as necessary based on labs.  Actinic keratosis On bridge of nose.  Discussed options, will do trial of solaraze for 90 days.  Therapeutic expectations and side effect profile of medication discussed today.  Patient's questions answered.     F/u 3 months.

## 2011-03-09 NOTE — Assessment & Plan Note (Signed)
On bridge of nose.  Discussed options, will do trial of solaraze for 90 days.  Therapeutic expectations and side effect profile of medication discussed today.  Patient's questions answered.

## 2011-03-09 NOTE — Assessment & Plan Note (Signed)
Check FLP ASAP (he's not fasting today). Referred for MNT. Continue pravastatin, adjust as necessary based on labs.

## 2011-03-09 NOTE — Patient Instructions (Signed)
Make appt for lab work here ASAP (fasting).

## 2011-03-09 NOTE — Assessment & Plan Note (Signed)
Needs to start monitoring!--Check fasting qAM and one 2h PP each day and bring to next visit in 51mo. Check HbA1c today to see how our last dose increase of metformin did (from 500bid to 1000 bid). Urine microalb is UTD, eye exam UTD, foot exam done today.  Referred for dietitian today for MNT--he has never been and needs to start doing dietary management for his medical issues. F/u 3 mo.

## 2011-03-10 ENCOUNTER — Encounter: Payer: Self-pay | Admitting: Family Medicine

## 2011-03-10 ENCOUNTER — Other Ambulatory Visit (INDEPENDENT_AMBULATORY_CARE_PROVIDER_SITE_OTHER): Payer: Medicare Other | Admitting: Family Medicine

## 2011-03-10 ENCOUNTER — Other Ambulatory Visit (INDEPENDENT_AMBULATORY_CARE_PROVIDER_SITE_OTHER): Payer: Medicare Other

## 2011-03-10 DIAGNOSIS — E785 Hyperlipidemia, unspecified: Secondary | ICD-10-CM

## 2011-03-10 DIAGNOSIS — E119 Type 2 diabetes mellitus without complications: Secondary | ICD-10-CM

## 2011-03-10 LAB — LIPID PANEL
HDL: 37.8 mg/dL — ABNORMAL LOW (ref >39.00)
Total CHOL/HDL Ratio: 6
Triglycerides: 134 mg/dL (ref 0.0–149.0)
VLDL: 26.8 mg/dL (ref 0.0–40.0)

## 2011-03-10 LAB — COMPREHENSIVE METABOLIC PANEL
ALT: 28 U/L (ref 0–53)
AST: 22 U/L (ref 0–37)
Alkaline Phosphatase: 58 U/L (ref 39–117)
BUN: 14 mg/dL (ref 6–23)
Calcium: 9.1 mg/dL (ref 8.4–10.5)
Chloride: 102 mEq/L (ref 96–112)
Creatinine, Ser: 1.2 mg/dL (ref 0.4–1.5)

## 2011-03-10 LAB — HEMOGLOBIN A1C: Hgb A1c MFr Bld: 7.9 % — ABNORMAL HIGH (ref 4.6–6.5)

## 2011-03-10 LAB — LDL CHOLESTEROL, DIRECT: Direct LDL: 140.4 mg/dL

## 2011-03-14 ENCOUNTER — Telehealth: Payer: Self-pay | Admitting: *Deleted

## 2011-03-14 ENCOUNTER — Other Ambulatory Visit: Payer: Self-pay | Admitting: Family Medicine

## 2011-03-14 MED ORDER — PIOGLITAZONE HCL 15 MG PO TABS: 15.0000 mg | ORAL_TABLET | Freq: Every day | ORAL | Status: DC

## 2011-03-14 MED ORDER — ATORVASTATIN CALCIUM 10 MG PO TABS: 10.0000 mg | ORAL_TABLET | Freq: Every day | ORAL | Status: DC

## 2011-03-14 NOTE — Telephone Encounter (Signed)
Notified wife of lab results and new prescriptions.  She voices good understanding and is agreeable.

## 2011-03-14 NOTE — Telephone Encounter (Signed)
Message copied by Francee Piccolo on Mon Mar 14, 2011  1:58 PM ------      Message from: Nicoletta Ba      Created: Mon Mar 14, 2011 10:16 AM       Pls notify: his HbA1c was improved, but I want to add another diabetes pill.  I'll do eRx for actos 15mg , 1 tab qd.        Also, his pravachol is not helping his "bad" cholesterol.  In fact, it got worse since switching to this med, and I recommend---as per Dr. Myrtis Ser' suggestion---that we switch to trial of generic lipitor.  I'll eRx 10mg  lipitor to take 1 per day INSTEAD of his pravachol.      We'll recheck cholesterol and HbA1c at next o/v.  Thx.

## 2011-03-15 ENCOUNTER — Telehealth: Payer: Self-pay | Admitting: *Deleted

## 2011-03-15 ENCOUNTER — Other Ambulatory Visit: Payer: Self-pay | Admitting: Family Medicine

## 2011-03-15 LAB — GLUCOSE, CAPILLARY
Glucose-Capillary: 125 mg/dL — ABNORMAL HIGH (ref 70–99)
Glucose-Capillary: 132 mg/dL — ABNORMAL HIGH (ref 70–99)
Glucose-Capillary: 151 mg/dL — ABNORMAL HIGH (ref 70–99)
Glucose-Capillary: 154 mg/dL — ABNORMAL HIGH (ref 70–99)
Glucose-Capillary: 156 mg/dL — ABNORMAL HIGH (ref 70–99)
Glucose-Capillary: 172 mg/dL — ABNORMAL HIGH (ref 70–99)
Glucose-Capillary: 184 mg/dL — ABNORMAL HIGH (ref 70–99)

## 2011-03-15 LAB — BASIC METABOLIC PANEL
Calcium: 9.2 mg/dL (ref 8.4–10.5)
GFR calc Af Amer: >60 mL/min (ref >60)
GFR calc non Af Amer: >60 mL/min (ref >60)
Sodium: 139 mEq/L (ref 135–145)

## 2011-03-15 LAB — CARDIAC PANEL(CRET KIN+CKTOT+MB+TROPI)
CK, MB: 1 ng/mL (ref 0.3–4.0)
CK, MB: 1.2 ng/mL (ref 0.3–4.0)
CK, MB: 2 ng/mL (ref 0.3–4.0)
Relative Index: INVALID (ref 0.0–2.5)
Relative Index: INVALID (ref 0.0–2.5)
Relative Index: INVALID (ref 0.0–2.5)
Total CK: 56 U/L (ref 7–232)
Total CK: 68 U/L (ref 7–232)
Troponin I: 0.67 ng/mL (ref 0.00–0.06)

## 2011-03-15 LAB — DIFFERENTIAL
Basophils Absolute: 0 10*3/uL (ref 0.0–0.1)
Basophils Relative: 0 % (ref 0–1)
Eosinophils Absolute: 0.1 10*3/uL (ref 0.0–0.7)
Monocytes Absolute: 0.4 10*3/uL (ref 0.1–1.0)
Neutro Abs: 6.8 10*3/uL (ref 1.7–7.7)
Neutrophils Relative %: 84 % — ABNORMAL HIGH (ref 43–77)

## 2011-03-15 LAB — CBC
Hemoglobin: 13.3 g/dL (ref 13.0–17.0)
Hemoglobin: 14 g/dL (ref 13.0–17.0)
MCHC: 34.6 g/dL (ref 30.0–36.0)
MCHC: 35.2 g/dL (ref 30.0–36.0)
MCV: 91.9 fL (ref 78.0–100.0)
Platelets: 179 10*3/uL (ref 150–400)
RDW: 13.3 % (ref 11.5–15.5)
RDW: 13.5 % (ref 11.5–15.5)
RDW: 13.5 % (ref 11.5–15.5)

## 2011-03-15 LAB — TSH: TSH: 2 u[IU]/mL (ref 0.350–4.500)

## 2011-03-15 LAB — LIPID PANEL
Cholesterol: 204 mg/dL — ABNORMAL HIGH (ref 0–200)
LDL Cholesterol: 124 mg/dL — ABNORMAL HIGH (ref 0–99)
Total CHOL/HDL Ratio: 7.3 RATIO

## 2011-03-15 LAB — HEPARIN LEVEL (UNFRACTIONATED)
Heparin Unfractionated: 0.36 IU/mL (ref 0.30–0.70)
Heparin Unfractionated: 0.49 IU/mL (ref 0.30–0.70)

## 2011-03-15 MED ORDER — SITAGLIPTIN PHOS-METFORMIN HCL 50-1000 MG PO TABS: 1.0000 | ORAL_TABLET | Freq: Two times a day (BID) | ORAL | Status: DC

## 2011-03-15 NOTE — Telephone Encounter (Addendum)
PC from wife, Steward Drone, stating meds are too expensive.  Copay of $45 on Actos and Lipitor.  Per Martie Lee at pharmacy lipitor is unpreferred.  I spoke to [place] and [facility] who initiated an exception for atorvastatin to get a cheaper co-pay.  They will fax decision within 24 hours.

## 2011-03-15 NOTE — Telephone Encounter (Signed)
Pt wife notified of below.  We will supply samples of Januvia 100, once daily.  He will continue his metformin 100, twice daily that he has just picked up from pharmacy.  Steward Drone states that the patient has never taken Lovastatin.  Advised her we will call this in to [facility] as it is one of their $4 medications.  Pt and wife agreeable to plan.

## 2011-03-15 NOTE — Telephone Encounter (Signed)
I sent eRx for janumet 50/1000 generic.  Tell patient this is metformin and a new med for diabetes combined----one pill/one copay---so if this ends up being something they can afford he needs to STOP his metformin that he is currently taking.

## 2011-03-16 MED ORDER — LOVASTATIN 20 MG PO TABS: 20.0000 mg | ORAL_TABLET | Freq: Every day | ORAL | Status: DC

## 2011-03-16 NOTE — Telephone Encounter (Signed)
Lovastatin 20mg  qd, #30, RF x 5--eRx'd to [facility] pharmacy today.

## 2011-03-28 ENCOUNTER — Ambulatory Visit: Payer: Medicare Other | Admitting: Dietician

## 2011-04-04 ENCOUNTER — Ambulatory Visit: Payer: Medicare Other | Admitting: Dietician

## 2011-04-19 NOTE — Discharge Summary (Signed)
Derrick [name], Derrick [name]               ACCOUNT NO.:  [id number]   MEDICAL RECORD NO.:  [id number]          PATIENT TYPE:  INP   LOCATION:  2507                         FACILITY:  [facility]   PHYSICIAN:  Everardo Beals. Juanda Chance, MD, FACCDATE OF BIRTH:  08-08-1946   DATE OF ADMISSION:  04/11/2009  DATE OF DISCHARGE:  04/14/2009                               DISCHARGE SUMMARY   PRIMARY CARDIOLOGIST:  Luis Abed, MD, [facility].   PRIMARY CARE PHYSICIAN:  Molly Maduro Day, MD.   DISCHARGE DIAGNOSIS:  Non-ST elevation myocardial infarction.   SECONDARY DIAGNOSES:  1. Chronic obstructive pulmonary disease.  2. Ongoing tobacco disorder.  3. Diabetes mellitus type 2.  4. History of back surgery.   ALLERGIES:  NKDA.   PROCEDURES PERFORMED DURING THIS HOSPITALIZATION:  1. EKG performed Apr 11, 2009, showing a normal sinus rhythm with      inverted anterior T-waves, compatible with anterior ischemia.  2. Chest x-ray completed Apr 11, 2009, showing chronic obstructive      pulmonary disease.  No acute cardiopulmonary disease.  3. EKG showing sinus bradycardia with a rate of 56 bpm, symmetrical      deep T-wave inversion in V1 through V4, minimal T-wave inversion in      V5 and T-wave flattening in V6 as well as I and aVL.  PR 146,      prolonged QT 484, QRS 92.  EKG performed on Apr 13, 2009, showing      sinus bradycardia at 58 bpm.  No significant changes from tracing      completed on Apr 12, 2009.  4. Cardiac catheterization performed on Apr 13, 2009, revealing 90%      proximal and osteal stenosis in the left anterior descending, 40%      narrowing in the distal left anterior descending, 80% narrowing in      the distal circumflex artery and 70-80% narrowing in the proximal      to mid right coronary artery with normal left ventricular function.      Successful percutaneous coronary intervention of lesion in the      proximal and ostium of left anterior descending using XIENCE-drug      eluting stent and  IVUS guidance with improvement in center      narrowing from 90% to 0.  5. EKG performed Apr 14, 2009, with T-wave flattening in V1, V6, I,      and aVL, as well as in inferior leads, minimal T-wave inversion in      V2 and V5 with a deeper T-wave inversion in V3 and V4.  Otherwise,      sinus bradycardia with 56 bpm and no other significant changes from      prior tracing.  6. Left groin ultrasound.  No evidence of pseudoaneurysm or      arteriovenous fistula.  Left femoral artery demonstrates some P      waveform in the proximal midportions, appears to occlude in the mid-      distal portion with vasoconstriction via collateral.   HISTORY OF PRESENT ILLNESS:  Derrick [name] is a 65 year old  male with  known history of diabetes and surgery of cervical spine.  He has no  previously documented history of CAD.  Over the past several weeks, he  has noticed significant chest discomfort with activity.  Over the past  several days, this has become more frequent with increased severity with  exertion.  Resolution of symptoms after 10-30 minutes of rest.  He  originally went to [facility] [place]  [place] and was noted to have abnormal EKG and elevated troponin.  It  was thought he had acute coronary syndrome and was transferred to [facility] for further evaluation.  His chest pain has been  intermittent and definitely exertional.  No nausea or vomiting.  No  diaphoreses.  It is a dull, pressure tightness sensation across his left  chest.  He reports pain in left arm at times as well.   HOSPITAL COURSE:  The patient was admitted and underwent procedures as  described above.  He tolerated them well without significant  complications.  The patient enrolled in TRACER clinical trial on Apr 13, 2009, given all risks and benefits of study participation prior to  signing informed consent form.  The patient received a copy of signed  consent form prior to any  studies related to procedures being completed.  The patient received a smoking cessation education and information from  [facility] and began phase I cardiac rehab while inpatient.  Received referral for phase II in [place].  Postcath, the patient noted to  have left groin bruit, however, no evidence of pseudoaneurysm or AV  fistula on left groin ultrasound.  Vital signs remained stable during  brief hospital course.  Most recent vital signs on the date of  discharge, temperature 97.6 degrees Fahrenheit, BP 116/72, pulse 70,  respirations 18, O2 saturation 100% on room air.  The patient will be  discharged on home medications with instructions to restart metformin on  Apr 15, 2009, secondary to concern regarding potential renal toxicity  postcath.  The patient will receive new medication list including  Plavix, aspirin, and simvastatin as well as nitroglycerin with new  prescriptions at the time of discharge.  Note, no beta-blocker secondary  to the patient's history of COPD and minimal wheezing on exam.  The  patient will also receive his followup instructions and postcath  instructions at the time of discharge.  At that time, he should have no  questions or concerns that have not been addressed.   DISCHARGE LABORATORY DATA:  WBC 8.1, HGB 14.0, HCT 40.5, PLT count 179.  Sodium 139, potassium 4.5, chloride 102, CO2 of 28, BUN 10, and  creatinine 1.17.  Glucose 215, calcium 9.2, troponin downtrending from  peak of 0.72 to 0.22 on date of discharge and total cholesterol 204,  triglycerides 262, HDL 28, LDL 124, VLDL 52, cholesterol/HDL ratio 7.3,  TSH 2.0.   FOLLOWUP PLANS AND APPOINTMENTS:  1. The patient is instructed to follow up with Dr. Morrie Sheldon in 1-2 weeks.  2. Followup with Nicolasa Ducking, ANP, at [facility] on Apr 30, 2009 at 3:45 p.m.   DISCHARGE MEDICATIONS:  1. Metformin 500 mg p.o. b.i.d. (restart Apr 15, 2009).  2. Glipizide 10 mg p.o. daily.  3. Naprosyn  as previously prescribed p.r.n.  4. Vicodin 5/500 mg p.o. b.i.d. p.r.n. for pain.  5. TRACER study medication p.o. daily.  6. Home inhalers, as previously prescribed.  7. Simvastatin 40 mg p.o.  daily.  8. Enteric-coated aspirin 325 mg p.o. daily.  9. Plavix 75 mg p.o. daily.  10.Nitroglycerin 0.4 mg sublingual p.r.n. for chest pain.   Duration of discharge encounter including physician time was 40 minutes.      Jarrett Ables, PAC      Bruce R. Juanda Chance, MD, [facility]  Electronically Signed    MS/MEDQ  D:  04/14/2009  T:  04/15/2009  Job:  [id number]   cc:   Alfredia Client, MD  Luis Abed, MD, [facility]  Nicolasa Ducking, ANP

## 2011-04-19 NOTE — Cardiovascular Report (Signed)
NAMEOMARE, BILOTTA               ACCOUNT NO.:  [id number]   MEDICAL RECORD NO.:  [id number]          PATIENT TYPE:  INP   LOCATION:  2507                         FACILITY:  [facility]   PHYSICIAN:  Everardo Beals. Juanda Chance, MD, FACCDATE OF BIRTH:  04-Mar-1946   DATE OF PROCEDURE:  04/13/2009  DATE OF DISCHARGE:                            CARDIAC CATHETERIZATION   PROCEDURE:  Cardiac catheterization and percutaneous coronary  intervention.   CLINICAL HISTORY:  [name] is 65 year old and works at [employer]  He  has diabetes but no prior known history of heart disease.  He recently  came to [facility] in [place] with chest pain and was found to have  anterior T-wave changes and positive enzymes consistent with a non-ST-  elevation MI.  He was seen by Dr. Myrtis Ser and transferred to [place] for  evaluation with angiography.   PROCEDURE IN DETAIL:  The procedure was performed via the right femoral  artery using an arterial sheath and 6-French preformed coronary  catheters.  A front wall arterial puncture was performed, and Omnipaque  contrast was used.  The patient was given 600 mg of Plavix load and then  previously been given 4 chewable aspirin.  He was also enrolled in the  tracer trial.   We used a Q-3.5 guiding catheter with side holes.  We passed a Prowater  wire down the LAD across the lesion without difficulty.  We predilated  with a 2.25 x 20-mm balloon performing 2 inflations up to 8 atmospheres  for 30 seconds.  We then passed an Atlantis catheter across the lesion  at automatic pullback.  It appeared that there may be some thrombus in  the lesion, so we next went in with a Fetch catheter and performed 2  runs and aspiration of a small amount of thrombus.  We then decided to  deploy a 2.75 x 23-mm Xience stent positioning the proximal edge of the  stent just outside the ostium of the LAD.  We deployed this with one  inflation of 8 atmospheres for 30 seconds.  We postdilated with a 3.25 x  15-mm [place] apex performing 3 inflations up to 17 atmospheres for 30  seconds.  We then performed a final IVUS run.  Final diagnostics were  then performed through the guiding catheter.  The patient tolerated the  procedure well and left the laboratory in satisfactory condition.   RESULTS:  The left main coronary artery:  The left main coronary artery  is free of disease.   Left anterior descending artery:  The left anterior descending artery  gave rise to a septal perforator and diagonal branch and then several  small septal perforators and diagonal branches.  The LAD was diffusely  diseased in the proximal portion crossing the first small diagonal  branch and septal perforator.  There was a 90% focal stenosis near the  ostium.   The circumflex artery:  The circumflex artery gave rise to a ramus  branch, a marginal branch, an atrial branch, and posterolateral branch.  There was 80% narrowing in the distal AV circumflex after the marginal  branch.  The right coronary artery:  The right coronary artery was a small  caliber vessel that was diffusely diseased in its proximal and  midportion.  There was an area of long 70% stenosis and irregularity  extending from the proximal to mid vessel to the mid to distal vessel.   The left ventriculogram:  The left ventriculogram performed in the RAO  projection showed good wall motion with no areas of hypokinesis.   Following stenting the lesion in the proximal and ostium of the LAD,  stenosis improved from 90% to 0%.   The IVUS run post intervention showed fairly good expansion of the  minimal lumen narrowing with a little less than 3 despite using a  posterior balloon of 3.25.  There was good apposition.  Proximal edge of  the stent was extended about 3 mm across the circumflex.   CONCLUSION:  1. Non-ST-elevation myocardial infarction with coronary artery disease      with 90% proximal and ostial stenosis in the LAD, 40% narrowing in       the distal LAD, 80% narrowing in the distal circumflex artery, and      70-80% narrowing in the proximal to mid right coronary artery with      normal LV function.  2. Successful PCI of the lesion in the proximal and ostium of the LAD      using a Xience drug-eluting stent and IVUS guidance with      improvement in center narrowing from 90% to 0%.   DISPOSITION:  The patient returned to [facility] room for further  observation.  He will require intensive secondary risk factor  modification.  He may be able to go home tomorrow.      Bruce Elvera Lennox Juanda Chance, MD, [facility]  Electronically Signed     BRB/MEDQ  D:  04/13/2009  T:  04/14/2009  Job:  [id number]   cc:   Luis Abed, MD, [facility]  Alfredia Client, MD

## 2011-04-19 NOTE — H&P (Signed)
NAME:  Derrick [name], Derrick [name] NO.:  [id number]   MEDICAL RECORD NO.:  [id number]          PATIENT TYPE:  INP   LOCATION:  3736                         FACILITY:  [facility]   PHYSICIAN:  Derrick Abed, MD, FACCDATE OF BIRTH:  [birth year]   DATE OF ADMISSION:  04/11/2009  DATE OF DISCHARGE:                              HISTORY & PHYSICAL   Derrick [name] has diabetes and has had surgery and problems with his  cervical spine.  He has not previously had any documented coronary  disease.  He is on glipizide.  Over the past several weeks he has  noticed significant chest discomfort when trying to do certain  activities.  Over the past few days this has become recurrent and each  time he tries to do a physical activity he has significant chest pain  and has to sit down for anywhere from 10-30 minutes.  He went to the  [facility] in [place].  He was seen  there by Dr. Florian Buff.  It was noted that his EKG was abnormal and he had  an elevated troponin.  It was felt that he did have acute coronary  syndrome and he was transferred to [place] for further evaluation.   The chest pain has been intermittent as noted but it is definitely  exertional.  There is no nausea or vomiting.  There is no diaphoresis.  It is a dull, pressure tightness sensation that he has across the left  chest.  He does feel it in his left arm at times.   ALLERGIES:  No known drug allergies.   MEDICATIONS:  Glipizide and Vicodin.   OTHER MEDICAL PROBLEMS:  See the complete list below.   SOCIAL HISTORY:  The patient does smoke one pack per day.  He does not  drink alcohol or use other drugs.   FAMILY HISTORY:  There is a family history of coronary artery disease.   REVIEW OF SYSTEMS:  At this time he has no fevers or chills.  There is  no skin rash.  He has no headaches.  There has been no change in his  vision or his hearing.  He does not have a cough.  He has no GI or GU  symptoms.   His musculoskeletal symptoms are under control with his  discomfort from his spine disease.  He has no swelling.  All other  systems are reviewed and are negative.   PHYSICAL EXAMINATION:  VITAL SIGNS:  Blood pressure is 147/85.  Pulse is  80.  Respirations 18.  GENERAL:  The patient is oriented to person, time and place.  Affect is  normal.  HEENT:  Reveals no xanthelasma.  He has normal extraocular motion.  NECK:  There are no carotid bruits.  There is no jugular venous  distention.  LUNGS:  Clear.  Respiratory effort is not labored.  CARDIAC:  Reveals S1-S2.  There are no clicks or significant murmurs.  ABDOMEN:  The abdomen is soft.  There are no masses or bruits.  He has  no peripheral edema.  MUSCULOSKELETAL:  There are  no major musculoskeletal deformities.   His first labs show that his CPK is 90 with an MB of 2.6 and a troponin  of 0.47.  Hemoglobin is 14.6.  Platelet count is 204,000.  INR is 1.0.  I do not have chest x-ray results at this time.  EKG reveals normal  sinus rhythm.  He has inverted anterior T-waves.  This is compatible  with anterior ischemia.   PROBLEMS:  1. Diabetes.  2. History of back surgery.  3. Smoking history.  4. Intermittent chest discomfort and now presentation with abnormal      EKG and elevated troponin.   The patient's presentation is compatible with an acute coronary  syndrome.  He will be treated with aspirin, beta-blocker, IV heparin and  a statin.  Because we are proceeding to catheterization I have chosen  not to start Plavix at this time.  We will obtain a smoking cessation  consult.  I have discussed all this with the patient and he is in  agreement.  He will undergo cardiac catheterization for further  evaluation.      Derrick Abed, MD, [facility]  Electronically Signed     JDK/MEDQ  D:  04/11/2009  T:  04/11/2009  Job:  [id number]   cc:   Dr Day  Dr Myrtis Ser

## 2011-04-22 NOTE — Op Note (Signed)
NAME:  Derrick [name], Derrick [name]                         ACCOUNT NO.:  [id number]   MEDICAL RECORD NO.:  [id number]                   PATIENT TYPE:  INP   LOCATION:  3004                                 FACILITY:  [facility]   PHYSICIAN:  Cristi Loron, M.D.            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   PROCEDURES:  1. C4 and C5 anterior corpectomy using microdissection.  2. C3-4, C4-5, and C5-6 anterior interbody iliac crest strut graft allograft     arthrodesis.  3. Anterior cervical plating, C3 to C6, using Primer titanium plate and     screws.   SURGEON:  Cristi Loron, M.D.   ASSISTANT:  Clydene Fake, M.D.   ANESTHESIA:  General endotracheal.   ESTIMATED BLOOD LOSS:  300 mL.   SPECIMENS:  None.   DRAINS:  None.   BRIEF HISTORY:  The patient is a 65 year old white male who has suffered  from neck and right shoulder pain.  He was worked up with a cervical MRI,  which demonstrated ossification of the posterior longitudinal ligament at C3-  4, C4-5, C5-6, and to a lesser extent at C6-7.  I discussed the various  treatment options with him and recommended that he consider surgery.  The  patient weighed the risks, benefits, and alternatives of surgery and decided  to proceed with a cervical decompression and fusion.   DESCRIPTION OF PROCEDURE:  The patient was brought to the operating room by  the anesthesia team.  General endotracheal anesthesia was induced.  The  patient remained in supine position and a roll was placed underneath his  shoulders to  place his neck in slight extension.  His anterior cervical  region was then prepared with Betadine scrub and Betadine solution, and  sterile drapes were applied.  I then injected the area to be incised with  Marcaine with epinephrine solution.  I then used a scalpel to make a  transverse incision in the patient's left anterior neck.  I then used the  Metzenbaum scissors to divide the patient's platysma muscle and then  dissected medial to the sternocleidomastoid muscle, jugular vein, and  carotid artery.  I bluntly dissected down toward the anterior cervical spine  by identifying the esophagus and retracting it medially.  I cleared the soft  tissue off the anterior cervical spine using Kitner swabs and then inserted  a bent spinal needle into an exposed interspace to confirm our location.   We then used electrocautery to detach the medial border of the longus colli  muscle bilaterally at C3-4, C4-5,  and C5-6 intervertebral disk space and  inserted the Caspar self-retaining retractor for exposure.  We then incised  the C3-4, C4-5, and C5-6 intervertebral disk and performed a partial  diskectomy using the pituitary forceps.   We then brought the operating microscope into the field and under its  magnification and illumination completed the decompression/microdissection.  We used a high-speed drill to perform a C4 and C5 vertebral corpectomy,  i.e., drill away the vertebral body until we encountered the posterior  longitudinal ligament.  We carefully dissected through the posterior  longitudinal ligament with the micro-nerve hook and arachnoid knife and then  incised the posterior longitudinal ligament with a ___ knife.  We then  removed the ligament.  We began at C3-4 and decompressed the C3-4 interspace  and thecal sac and worked our way caudally.  There was clearly ossification  of the posterior longitudinal ligament with a more or less continuous ridge  of bone running down the center of  the dura.  We carefully dissected the  spondylosis from the dura and removed it with the Kerrison punch,  decompressing the thecal sac from C3-4 all the way down to C5-6.  At the  caudal aspect of the decompression, the dura was thinned and in the process  of removing the ossification from the dura, a durotomy was created.  This  was closed using two interrupted 6-0 Prolene stitches, and at this point we  had anesthesia Valsalva the patient and there was no evidence of a CSF  fistula.  We then completed the decompression by undercutting the vertebral  end plates and removing spondylosis from the C6 as well as the C3 vertebral  body.  We then performed foraminotomies about the bilateral C4, C5, and C6  nerve roots.  The worst was C5 on the right, but we got a good decompression  on each nerve root.   Having completed the decompression, we now turned our attention to the  arthrodesis.  Because of the prior durotomy, we obtained some Tisseel and  placed it in the epidural space to guard against a CSF leak.  We then  prepared the vertebral end plates at C6 and C3 by carefully using the Karlin  curettes to clear all the soft tissue from the vertebral end plates.  We  then prepared the vertebral end plates with a high-speed drill, slightly  decorticating it but being careful not to weaken the structure of the bone  over too far or getting to the clearly cancellous bone.  We then obtained  the iliac crest tricortical allograft bone graft and fashioned it to the  appropriate dimensions.  We then placed distraction screws at C3 and C6,  distracted the corpectomy site, and then placed the bone graft into the  corpectomy site.  Unfortunately, the bone graft was a little too small and  we had to prepare a second bone graft, which we tapped into place and it fit  nicely and it had a good, snug fit with good bone apposition to the C3 and  C6 vertebral bodies.  We now turned our attention to the  anterior spinal instrumentation.  We  obtained the appropriate-length Premier anterior cervical plate and laid it  along the anterior aspect of the vertebral bodies from C3 to C5.  We drilled  holes at C3 and two at C5, tapped the holes, and then secured the plate to  the intervertebral bodies by placing two 14 mm screws at C6 and two 13 mm  screws at C3.  We then drilled another hole in the bone graft and secured  the bone graft to the plate by placing a 13 mm screw through the bone graft.  We then obtained the intraoperative radiograph, which demonstrated good  position of the upper plate, screws, and interbody graft.  We could not see  the lower screws very well because of the patient's body habitus, but they  looked good in vivo.  We secured these screws to the plate by sliding the  locking mechanism over a screw and securing the locking mechanism with the  other screws.  We then obtained stringent hemostasis using bipolar  electrocautery and Gelfoam.  We copiously irrigated the wound out with  bacitracin solution and removed the solution and then removed the Caspar  self-retaining retractor.  We inspected the esophagus for any damage.  There  was none apparent.  We then reapproximated the patient's platysma muscle  with interrupted 3-0 Vicryl suture, the subcutaneous tissue with interrupted  3-0 Vicryl suture, and the skin with Steri-Strips and Benzoin.  The wound  was then coated with bacitracin ointment, a sterile dressing was applied,  the drapes were removed.  The patient was subsequently extubated by the  anesthesia team and transported to the postanesthesia care unit in stable  condition.  All sponge, instrument, and needle counts were correct at the  end of this case.                                               Cristi Loron, M.D.    JDJ/MEDQ  D:  02/06/2003  T:  02/07/2003  Job:  [id number]

## 2011-04-22 NOTE — Op Note (Signed)
NAME:  Derrick [name], Derrick [name]                         ACCOUNT NO.:  [id number]   MEDICAL RECORD NO.:  [id number]                   PATIENT TYPE:  INP   LOCATION:  3305                                 FACILITY:  [facility]   PHYSICIAN:  Cristi Loron, M.D.            DATE OF BIRTH:  07/27/1946   DATE OF PROCEDURE:  03/10/2003  DATE OF DISCHARGE:                                 OPERATIVE REPORT   PREOPERATIVE DIAGNOSIS:  Cervical pseudoarthrosis.   POSTOPERATIVE DIAGNOSIS:  Cervical pseudoarthrosis.   PROCEDURES:  1. Exploration of cervical fusion.  2. Removal of anterior cervical instrumentation.  3. C3 corpectomy.  4. Anterior interbody fusion, C2 to C6, with insertion of Cornerstone PSR     interbody prosthesis.  5. Anterior cervical instrumentation, C2 to C6, with Codman titanium plate     and screws.   SURGEON:  Cristi Loron, M.D.   ASSISTANT:  Hilda Lias, M.D.   ANESTHESIA:  General endotracheal.   ESTIMATED BLOOD LOSS:  250 mL.   SPECIMENS:  None.   DRAINS:  None.   COMPLICATIONS:  None.   BRIEF HISTORY:  The patient is a 65 year old white male on whom I performed  a C4 and C5 corpectomy, fusion, and plating approximately one month ago.  He  did relatively well but on a postoperative follow-up visit we obtained a  cervical x-ray, which demonstrated that the bone graft had telescoped into  the C3 vertebral body and that the screws of the anterior cervical plate had  pulled out somewhat.  I discussed the various treatment options with him and  recommended he undergo a revision of his fusion.  The patient is aware of  the risks, benefits, and alternatives of surgery and decided to proceed with  the operation.   DESCRIPTION OF PROCEDURE:  The patient was brought to the operating room by  the anesthesia team.  General endotracheal anesthesia was induced, and the  patient was carefully intubated.  His anterior cervical region was then  shaved and prepared with  Betadine scrub and Betadine solution.  Sterile  drapes were applied.  I then injected the area to be incised with Marcaine  with epinephrine solution.  I used a scalpel to make a transverse incision  through previous surgical incision.  I used the Metzenbaum scissors to  divide the platysma muscle and then to dissect medial to the  sternocleidomastoid muscle, jugular vein, and carotid artery, down toward  the anterior cervical spine.  There was a whole lot of scarring there, and  the tissues were carefully dissected.  I identified the patient's esophagus  and carefully retracted it medially.  We cleared the soft tissue from the  anterior cervical spine using Kitner swabs and exposed the anterior cervical  plate.  We then inserted the Caspar self-retaining retractor for exposure  and then unlocked the locking mechanism on the Premier plate and then  removed the screws,  the plate, and then the interbody bone graft.  We then  inspected the prior corpectomy site.  The vertebral body of C6 looked fine  but at C3, the bone graft had telescoped into the C3 vertebral body.  We  inspected the vertebral body.  It became clear that there was not enough of  the vertebral body left to accommodate more screws, not to mention the fact  that the vertebral body was soft.  We therefore used the high-speed drill to  complete the C3 corpectomy and then perform diskectomy at C2-3.   We then carefully cleared the soft tissue/intervertebral disk from the C2  vertebral end plate using the Karlin curettes and pituitary forceps, being  very careful not to disrupt the cortical margins.   We then obtained and assembled a 54 mm Cornerstone PSR interbody prosthesis  and then placed it into the corpectomy site from C2 down to C6 after  anesthesia gave some gentle retraction on the patient's head.  The  prosthesis was put in under fluoroscopic guidance and it in well with a  good, snug fit.   We now turned our  attention to the anterior spinal instrumentation.  We  cleared the soft tissue from the anterior cervical spine using  electrocautery and the osteophyte tool and then selected the appropriate  length Codman anterior cervical plate, laid it along the anterior aspect of  the vertebral bodies from C2 down to C6, drilled two holes at C2, two at C6,  tapped the holes, and then secured the plate to the vertebral bodies by  placing two 15 mm screws at C2 and two at C6.  We had good screw purchase at  both levels.  We then obtained a final intraoperative radiograph that  demonstrated good position of the plate, screws, and interbody prosthesis  from C2 to C6.  We then achieved stringent hemostasis using bipolar  electrocautery and Gelfoam.  We then copiously irrigated the wound out with  bacitracin solution and removed the solution and then removed the McCullough  self-retaining retractor.  We inspected the esophagus for any damage.  There  was none apparent.  We then reapproximated the patient's platysma muscle  with interrupted 3-0 Vicryl suture and the skin with interrupted 3-0 Vicryl  suture.  The wound was then covered with bacitracin ointment.  A sterile  dressing was applied, the drapes were removed, and the patient was  subsequently extubated by the anesthesia team and transported to the  postanesthesia care unit in stable condition.  All sponge, instrument, and  needle counts were correct at the end of this case.                                                Cristi Loron, M.D.    JDJ/MEDQ  D:  03/10/2003  T:  03/11/2003  Job:  [id number]

## 2011-04-22 NOTE — Op Note (Signed)
   NAME:  MARKELLE, NAJARIAN                         ACCOUNT NO.:  [id number]   MEDICAL RECORD NO.:  [id number]                   PATIENT TYPE:  INP   LOCATION:  3021                                 FACILITY:  [facility]   PHYSICIAN:  Cristi Loron, M.D.            DATE OF BIRTH:  Sep 28, 1946   DATE OF PROCEDURE:  03/10/2003  DATE OF DISCHARGE:  04/02/2003                                 OPERATIVE REPORT   This note is to clarify the patient's situation.  The surgery of March 10, 2003, was to explore and revise the cervical fusion.  During that surgery,  it became apparent that the patient would need additional posterior  stabilization, instrumentation, and fusion. Therefore, a third surgery was  planned/staged for March 12, 2003.                                               Cristi Loron, M.D.    JDJ/MEDQ  D:  04/04/2003  T:  04/04/2003  Job:  [id number]

## 2011-04-22 NOTE — Consult Note (Signed)
NAME:  TAKEEM, KROTZER                         ACCOUNT NO.:  [id number]   MEDICAL RECORD NO.:  [id number]                   PATIENT TYPE:  INP   LOCATION:  3305                                 FACILITY:  [facility]   PHYSICIAN:  Jonelle Sidle, M.D. [facility]        DATE OF BIRTH:  04/10/1946   DATE OF CONSULTATION:  03/14/2003  DATE OF DISCHARGE:                                   CONSULTATION   REASON FOR CONSULTATION:  Bradycardia.   HISTORY OF PRESENT ILLNESS:  The patient is a pleasant 65 year old gentleman  with a history of L5-S1 fusion as well as C4 and C5 corpectomy, fusion, and  plating.  He is recently status post exploration of cervical fusion with  removal of anterior cervical instrumentation, C3 corpectomy, anterior  interbody fusion, C2 to C6, with insertion of a cornerstone TSR interbody  prosthesis and anterior cervical instrumentation from C2 to C6 with a Codman  titanium plate and screws.  Subsequent to this, he underwent addition  posterior cervical fusion with instrumentation and bone scaffolding.  This  was done under general anesthesia with minor blood loss of 150 ml.  The  procedure was performed on seventh day of this month.  He has been  recuperating reasonably well.  Telemetry, over the last 24 to 48 hours has  shown episodic bradycardia with rates down into the 30s also associated with  significant sinus pauses of 2 to 4 seconds.  These episodes seem to occur  mostly either during the evening or when the patient is more sedated.  He is  in a C collar and has had some swelling in the cervical area. He denies any  chest discomfort.   In terms of past medical history he has no known history of hypertension,  diabetes mellitus, coronary artery disease, or myocardial infarction.  We  are asked to evaluate further.   ALLERGIES:  No known drug allergies.   CURRENT MEDICATIONS:  Morphine PCA pump, Colace, Percocet, Tylenol, Valium,  Zofran, Ambien, Benadryl, Reglan,  Compazine, and Toradol.   PAST MEDICAL HISTORY:  Significant lumbar and cervical disk disease as  outline with multiple surgeries over the last four years.   No clear history of coronary artery disease, myocardial infarction.  No  known history of dysrhythmia, conduction system disease, or syncope.   SOCIAL HISTORY:  The patient is married and has three children.  He is  Production designer, theatre/television/film of an Furniture conservator/restorer and lives in [place].  He has a 35+-  year history of tobacco use.  He denies alcohol use.   FAMILY HISTORY:  Noncontributory for premature coronary artery disease.  The  patient's father underwent bypass grafting in his 9s.  There is no history  of conduction system disease.   REVIEW OF SYSTEMS:  As discussed in History of Present Illness.   PHYSICAL EXAMINATION:  VITAL SIGNS:  Blood pressure presently 104/60, heart  rate in the 70s and regular,  respirations 22.  The patient is afebrile.  T-  max over the last 24 hours was 100.9 degrees.  Oxygen saturation 92% on 2  liters nasal cannula.  GENERAL:  Well-nourished male in a cervical collar in no acute distress.  He  is somewhat somnolent.  HEENT:  Conjunctivae and lids normal. Oropharynx clear.  NECK:  Not examined.  LUNGS: Clear to auscultation.  CARDIAC:  Regular rate and rhythm without significant murmur or gallop.  ABDOMEN:  Soft, nontender without organomegaly or bruits.  EXTREMITIES:  No edema.  Peripheral pulses 2+.  SKIN:  No ulcer changes noted.  MUSCULOSKELETAL:  No kyphosis noted.  NEUROPSYCHIATRIC:  The patient was alert and oriented x 3.   LABORATORY DATA:  WBC 6.0, hemoglobin 12.6, hematocrit 204.  Initial CK 562  with MB 2.1, relative index 0.4, troponin I 0.01.   A 12-lead electrocardiogram today shows sinus bradycardia at 49 beats per  minute with sinus arrhythmia and nonspecific T wave flattening.  Motion  artifact is also noted.   IMPRESSION:  1. Episodic sinus bradycardia with significant sinus  pauses of 2 to 4     seconds in a 65 year old male with no prior known cardiac history or     history of dysrhythmia.  He has had no syncope and has essentially been     asymptomatic.  He had one episode where he felt his vision whitening     during bradycardia.  Given the recent cervical surgery, one wonders if     cervical swelling with impingement of the vagus nerve could be a     potential explanation.  Will need to follow telemetry closely.  He is not     on any arteriovenous nodal blocking drugs.  He has been on a number of     sedatives.  2. Recent cervical surgery as outlined.   RECOMMENDATIONS:  1. Make sure that electrolytes are within normal limits.  2. Continue telemetry and observation.  3. Will ask electrophysiology to evaluate formally, particularly if episodes     continue despite improvement in post surgical status/swelling.                                               Jonelle Sidle, M.D. [facility]    SGM/MEDQ  D:  03/14/2003  T:  03/14/2003  Job:  [id number]

## 2011-04-22 NOTE — Discharge Summary (Signed)
NAME:  Derrick [name], Derrick [name]                         ACCOUNT NO.:  [id number]   MEDICAL RECORD NO.:  [id number]                   PATIENT TYPE:  INP   LOCATION:  3021                                 FACILITY:  [facility]   PHYSICIAN:  Cristi Loron, M.D.            DATE OF BIRTH:  [birth year]   DATE OF ADMISSION:  03/10/2003  DATE OF DISCHARGE:  04/02/2003                                 DISCHARGE SUMMARY   For full details of this admission, please refer to History and Physical.   BRIEF HISTORY:  The patient is a 65 year old white male who was presented  with neck and arm pain.  He underwent a corpectomy at C4 and C5 about a  month prior to this admission.  On a postop visit, he was found to have  collapsed his graft into the C3 vertebral body. I recommended a redo  anterior surgery with a staged posterior procedure. The patient and his wife  weighed the risks, benefits, and alternatives to surgery and decided to  proceed with the operations.   HOSPITAL COURSE:  Admitted the patient to [facility] 03/10/2003 with  diagnosis of a cervical pseudoarthrosis.  On day of admission, I performed  an exploration of his anterior cervical fusion, removal of the  instrumentation, C3 corpectomy, and the redo fusion from C2 to C6 had  anterior cervical plating.  Surgery went well without complications.  (For  full details, refer to the operative note in place.)   Postoperative Course:  The patient's postoperative course was initially  unremarkable. I discussed the posterior surgery with the patient and advised  recommended he undergo a posterior stabilization with instrumentation.  The  patient weighed the risks, benefits, and alternatives to this surgery and  decided to proceed with the operation.  On 03/12/2003, I performed a C2 to C6  posterior cervical instrumentation and fusion.  That surgery went well.  (For full details of this operation, please refer to typed operative note.)   The  remainder of the patient's postoperative course was as follows.  He  neurologically did well.  He did have some persistent trouble with  dysphagia.  We had a swallowing evaluation.  They recommended placement of  feeding tube as he was aspirating.  We did this.  He remained in hospital  until 03/25/2003.  His swallowing slowly improved but not to the point where  he could take p.o.'s, and he was, therefore, discharged to home with an oral  feeding tube and followup with speech pathologist for recheck.   The other event of significance during this hospitalization was that he did  have some profound bradycardia during several occasions.  We had the  cardiologist see him.  They followed him along and had cardiology consult.  They followed him along as well.  His bradycardia resolved, and they did not  think they needed to do anything more about this.   Because  of the patient's persistent dysphagia, we did have a GI consult, and  the patient was seen by Dr. Russella Dar.  He made some recommendations.  He  recommended the patient go home with a feeding tube and arranged for  followup.   By March 25, 2003, the patient was neurologically normal.  He had good motor  strength.  His wounds were healing well without signs of infection.  He was  afebrile.  His vital signs were stable, and he was requesting discharge  home.  He was, therefore, discharged to home with an enteral feeding tube  and arrangements to follow up with speech pathologist and Dr. Russella Dar as well  as to follow up with me in two weeks.   FINAL DIAGNOSES:  1. Pseudoarthrosis.  2. Dysphagia.  3. Bradycardia.   PROCEDURES:  1. Exploration of cervical fusion and removal of anterior instrumentation.  2. C4 corpectomy, anterior interbody fusion C2 to C6 with insertion of     Cornerstone peak interbody prosthesis, anterior cervical instrumentation     C3 to C6 with carbon titanium plate and screws, 03/10/2003.  3. C2-3, C3-4, C4-5, C5-6  posterior cervical fusion; C2 to C6 posterior     cervical instrumentation with Vitoss bone scaffolding on 03/12/2003.   DISCHARGE INSTRUCTIONS:  The patient was instructed to follow up with me in  two weeks.   DISCHARGE PRESCRIPTIONS:  1. Valium 5 mg, #50, 1 p.o. q.6h. p.r.n. pain, 1 refill.  2. Lortab elixir 1 pint p.r.n. for pain, 1 refill.  3. Protonix 40 mg 1 p.o. b.i.d., 1 refill.  4. Reglan solution 16 ounce, 1 refill.  5. Elavil 25 mg, #30, 1 refill.                                               Cristi Loron, M.D.    JDJ/MEDQ  D:  05/22/2003  T:  05/23/2003  Job:  [id number]   cc:   Jonelle Sidle, M.D. [facility] T. Russella Dar, M.D. [facility]    cc:   Jonelle Sidle, M.D. [facility] T. Russella Dar, M.D. [facility]

## 2011-04-22 NOTE — Op Note (Signed)
NAME:  Derrick [name], Derrick [name]                         ACCOUNT NO.:  [id number]   MEDICAL RECORD NO.:  [id number]                   PATIENT TYPE:  INP   LOCATION:  3305                                 FACILITY:  [facility]   PHYSICIAN:  Cristi Loron, M.D.            DATE OF BIRTH:  11-13-1946   DATE OF PROCEDURE:  03/12/2003  DATE OF DISCHARGE:                                 OPERATIVE REPORT   BRIEF HISTORY:  The patient is a 65 year old white male whom I performed a  C4 and C5 corpectomy fusion and plating.  He developed a pseudoarthrosis and  instrumentation failure.  I subsequently revised his surgery and performed a  C3 corpectomy with fusion from C2 to C6 with anterior cervical plating.  I  recommended that he undergo a posterior stabilization and fusion procedure  because I felt his neck could be unstable.  I described the surgery to him  and discussed the risks of surgery.  The patient weighed the risks,  benefits, and alternatives of surgery and decided to proceed with the  posterior surgery.   PREOPERATIVE DIAGNOSIS:  Cervical instability.   POSTOPERATIVE DIAGNOSIS:  Cervical instability.   PROCEDURE:  C2-C3, C3-C4, C4-C5, C5-C6 posterior cervical fusion; C2 to C6  posterior cervical instrumentation; with bone scaffolding.   SURGEON:  Cristi Loron, M.D.   ASSISTANT:  Clydene Fake, M.D.   ANESTHESIA:  General endotracheal anesthesia.   ESTIMATED BLOOD LOSS:  150 cc.   SPECIMENS:  None.   DRAINS:  None.   COMPLICATIONS:  None.   DESCRIPTION OF PROCEDURE:  The patient was brought to the operating room by  the anesthesia team.  General endotracheal anesthesia was induced.  The  Mayfield three point headrest was applied to the patient's calvarium and the  patient was carefully turned to the prone position.  His suboccipital region  was then shaved and his neck was prepared with Betadine scrub and Betadine  solution.  Sterile drapes were applied.  I then  injected the area to be  incised with Marcaine with epinephrine solution.  I used a scalpel to make a  linear midline incision from approximately C2 down to C6.  I used  electrocautery to dissect down to the cervical fascia, divide the fascia  bilaterally, performed a bilateral subperiosteal dissection, stripped the  paraspinous musculature from the spinous process of lamina from C2 down to  C6 and inserted the cerebellar retractor for exposure.  I then, under  fluoroscopic guidance, used the awl to decorticate the screw entry sites  from C2 to C6.  This was done under fluoroscopic guidance.  I then used the  14 mm drill and aimed approximately 20 degrees lateral and 20 degrees in a  step out direction from the midline of the lateral masses from C3 down to C6  and then tapped this trajectory and then placed a 3.5 by 16 mm lateral mass  screw from C3 to C6 bilaterally.  At C2, a similar procedure was performed,  but the screw was aimed 20 degrees cephalad and 20 degrees medial.  Then,  this was tapped with a 4 mm tap and a 4 by 16 mm variable angle titanium  screw was placed at C2 bilaterally under fluoroscopic guidance.  We then  contoured the length of rod and connected the unilateral screws and  tightened the locking caps appropriately completing the instrumentation.   We then used the high speed drill to remove the synovium and to corticate  the bilateral facet joints at C2-C3, C3-C4, C4-C5, and C5-C6, and then we  filled in the decorticated facets as well as the decorticated lateral masses  with bone scaffolding and laid more bone scaffolding over the decorticated  lateral masses completing the arthrodesis.  We then obtained stringent  hemostasis using bipolar electrocautery and then irrigated the wound out  with antibiotic solution.  We then removed the cerebellar retractors and  reapproximated the cervical fascia with interrupted 0 Vicryl suture, the  subcutaneous tissue with  interrupted 2-0 Vicryl suture, and the skin with  Steri-Strips and Benzoin.  The wound was then coated with Bacitracin  ointment.  A sterile dressing was applied, the drapes were removed.  The  patient was subsequently returned to the supine position where the Mayfield  three point headrest was removed and the patient was subsequently extubated  by the anesthesia team and transported to the post anesthesia care unit in  stable condition.  All sponge, instrument and needle counts were correct at  the end of the case.                                               Cristi Loron, M.D.    JDJ/MEDQ  D:  03/12/2003  T:  03/13/2003  Job:  [id number]

## 2011-04-22 NOTE — Op Note (Signed)
   NAME:  Derrick [name], Derrick [name]                         ACCOUNT NO.:  [id number]   MEDICAL RECORD NO.:  [id number]                   PATIENT TYPE:  INP   LOCATION:  3021                                 FACILITY:  [facility]   PHYSICIAN:  Cristi Loron, M.D.            DATE OF BIRTH:  1946-11-22   DATE OF PROCEDURE:  03/12/2003  DATE OF DISCHARGE:  04/02/2003                                 OPERATIVE REPORT   ADDENDUM:  This is to clarify the situation on March 12, 2003.  This  procedure is a planned staged procedure which was planned on March 09, 2003,  and done on March 12, 2003.                                               Cristi Loron, M.D.    JDJ/MEDQ  D:  04/04/2003  T:  04/04/2003  Job:  [id number]

## 2011-04-22 NOTE — Op Note (Signed)
   NAME:  DENMAN, PICHARDO                         ACCOUNT NO.:  [id number]   MEDICAL RECORD NO.:  [id number]                   PATIENT TYPE:  INP   LOCATION:  3021                                 FACILITY:  [facility]   PHYSICIAN:  Saheed Carrington DICTATOR                    DATE OF BIRTH:  [birth year]   DATE OF PROCEDURE:  DATE OF DISCHARGE:                                 OPERATIVE REPORT   ADDENDUM   This is to clarify that the patient's posterior cervical                                               Skylie Hiott DICTATOR    DD/MEDQ  D:  04/01/2003  T:  04/02/2003  Job:  [id number]

## 2011-04-22 NOTE — Discharge Summary (Signed)
   NAME:  FRANCISCOJAVIER, WRONSKI                         ACCOUNT NO.:  [id number]   MEDICAL RECORD NO.:  [id number]                   PATIENT TYPE:  INP   LOCATION:  3004                                 FACILITY:  [facility]   PHYSICIAN:  Stefani Dama, M.D.               DATE OF BIRTH:  06-10-1946   DATE OF ADMISSION:  02/06/2003  DATE OF DISCHARGE:  02/08/2003                                 DISCHARGE SUMMARY   ADMISSION DIAGNOSES:  1. Cervical spondylosis C3-4, C4-5 and C6-7 secondary to ossification of the     posterior longitudinal ligament.  2. Cervical radiculopathy.  3. Myelopathy.   DISCHARGE AND FINAL DIAGNOSES:  1. Cervical spondylosis C3-4, C4-5, and C-7 secondary to ossification of the     posterior longitudinal ligament.  2. Cervical radiculopathy.  3. Myelopathy.   OPERATION:  Cervical corpectomy C4 and C5, anterior reconstruction with  iliac crest bone graft, cervical plating C3 to C6  with Premier Titanium plate and screws on 02/06/03.   CONDITION ON DISCHARGE:  Improving.   HOSPITAL COURSE:  Mr. Emelio Schneller is a 65 year old individual who has had  significant neck, shoulder and arm pain.  He was found to have severe  spondylitic disease with evidence of spinal cord compression.  He was  advised regarding surgical decompression.  The decompression was performed  without incident, however, at the time of surgery he was found to have some  drainage of cerebral spinal fluid, this was closed primarily.  Postoperatively there was some fullness in the incision and the patient  complained of some headache.  She was observed in the hospital for a full 24  hours postoperatively.  His incision has subsequently remained dry, is  clean, he is not having any further headaches, he has not had any fever.  Neurologically he is intact.   DISPOSITION:  He has been advised regarding postoperative activities and is  discharged home at this time with a prescription for Vicodin #40  without  refills, Valium 5 mg #20 without refills.  He will be seen in the office in  two week's time for further follow up.                                                Stefani Dama, M.D.    Merla Riches  D:  02/08/2003  T:  02/10/2003  Job:  [id number]

## 2011-06-09 ENCOUNTER — Telehealth: Payer: Self-pay | Admitting: *Deleted

## 2011-06-09 ENCOUNTER — Ambulatory Visit: Payer: Medicare Other | Admitting: Family Medicine

## 2011-06-09 ENCOUNTER — Ambulatory Visit: Payer: Medicare Other | Admitting: Internal Medicine

## 2011-06-09 NOTE — Telephone Encounter (Signed)
Office Message from Date: 06/08/2011 12:00:00 AM Time of Call: [phone] Faxed To: [place] [facility] Fax Number: [phone] Facility: N/A Patient: [name], Derrick DOB: Jun 02, 1946 Phone: [phone] Provider: Other Message: See info below Regarding Appointment: Yes Appt Date: 06/09/2011 Appt Time: 8:30:00 AM Provider: Other Reason: Details: no money for the co pay Outcome: Instructed patient to call back on the next business day. Message Taken by: Crissie Figures, CSR [facility]  [address] [place], [place] [address]  P: [phone]  F: [phone]    Appointment cancelled

## 2011-07-19 ENCOUNTER — Other Ambulatory Visit: Payer: Self-pay | Admitting: Family Medicine

## 2011-07-19 NOTE — Telephone Encounter (Signed)
Last seen 03/09/11, cancelled followup for July 2012.  Must have OV for more refills.

## 2011-08-18 ENCOUNTER — Other Ambulatory Visit: Payer: Self-pay | Admitting: Family Medicine

## 2011-08-18 NOTE — Telephone Encounter (Signed)
I have attempted to contact this patient by phone with the following results: left message to return my call on answering machine (home).  

## 2011-08-24 NOTE — Telephone Encounter (Signed)
No return call from patient.  Pt will need to call office for appt before more refills can be given.  Follow up ended.

## 2011-09-14 ENCOUNTER — Other Ambulatory Visit: Payer: Self-pay | Admitting: Family Medicine

## 2011-09-14 NOTE — Telephone Encounter (Signed)
I did a one time RF for #30 tabs.  He must f/u in office before any further RFs of this med--final warning.

## 2011-09-14 NOTE — Telephone Encounter (Signed)
I have attempted to contact this patient by phone with the following results: left message to return my call on answering machine (home).  

## 2011-09-14 NOTE — Telephone Encounter (Signed)
Last seen on 03/09/11 and advised to follow up in 3 months.  Cancelled appt on 7/5 stating no money for co-pay.  No follow up appt in computer.  I have tried contacting patient on 9/13 to advise no further refills on meds until he has office visit.  Pt did not return my call.  Is this OK to refill?

## 2011-09-16 NOTE — Telephone Encounter (Signed)
I have attempted to contact this patient by phone with the following results: left message to return my call on answering machine (home).  

## 2011-09-16 NOTE — Telephone Encounter (Signed)
RC from wife.  appt scheduled for 10/22 @8 :30am.

## 2011-09-20 ENCOUNTER — Other Ambulatory Visit: Payer: Self-pay | Admitting: Family Medicine

## 2011-09-20 DIAGNOSIS — E119 Type 2 diabetes mellitus without complications: Secondary | ICD-10-CM

## 2011-09-20 DIAGNOSIS — E785 Hyperlipidemia, unspecified: Secondary | ICD-10-CM

## 2011-09-20 NOTE — Telephone Encounter (Signed)
Pt has follow up appt on 09/26/11.  30 day supply given.  No more refills if pt does not keep that appt.

## 2011-09-26 ENCOUNTER — Ambulatory Visit (INDEPENDENT_AMBULATORY_CARE_PROVIDER_SITE_OTHER): Payer: Medicare Other | Admitting: Family Medicine

## 2011-09-26 ENCOUNTER — Encounter: Payer: Self-pay | Admitting: Family Medicine

## 2011-09-26 DIAGNOSIS — E119 Type 2 diabetes mellitus without complications: Secondary | ICD-10-CM

## 2011-09-26 DIAGNOSIS — IMO0002 Reserved for concepts with insufficient information to code with codable children: Secondary | ICD-10-CM

## 2011-09-26 DIAGNOSIS — S46911A Strain of unspecified muscle, fascia and tendon at shoulder and upper arm level, right arm, initial encounter: Secondary | ICD-10-CM

## 2011-09-26 DIAGNOSIS — E785 Hyperlipidemia, unspecified: Secondary | ICD-10-CM

## 2011-09-26 DIAGNOSIS — L570 Actinic keratosis: Secondary | ICD-10-CM

## 2011-09-26 LAB — MICROALBUMIN / CREATININE URINE RATIO
Creatinine,U: 33.1 mg/dL
Microalb Creat Ratio: 1.2 mg/g (ref 0.0–30.0)
Microalb, Ur: 0.4 mg/dL (ref 0.0–1.9)

## 2011-09-26 LAB — LIPID PANEL
Cholesterol: 172 mg/dL (ref 0–200)
LDL Cholesterol: 88 mg/dL (ref 0–99)

## 2011-09-26 MED ORDER — DICLOFENAC SODIUM 75 MG PO TBEC: DELAYED_RELEASE_TABLET | ORAL | Status: DC

## 2011-09-26 NOTE — Assessment & Plan Note (Signed)
Muscular strain, possibly mild rotator cuff tendinopathy. Recommended diclofenac 75mg  bid x 10d, then bid prn. Home stretches discussed.

## 2011-09-26 NOTE — Assessment & Plan Note (Signed)
FLP today. Continue statin. 

## 2011-09-26 NOTE — Progress Notes (Signed)
OFFICE VISIT  09/26/2011   CC:  Chief Complaint  Patient presents with  . Follow-up    Diabetes, HTN, Shoulder pain x 2 months, place on nose     HPI:  presents   Patient is a 65 y.o. Caucasian male who for DM2 and hyperlipidemia f/u.  Also wants to discuss lesion on nose and right shoulder pain. Not monitoring glucoses at all, says he can't get in the habit. Did not go to MNT.  Says he eats whatever he wants. Janumet and actos were not started/filled b/c of cost.  He took the [place] samples we gave him for a month or so and did not fill rx when he ran out b/c of cost.  He's been on metformin and glipizide daily. He has been taking his ACE-I, statin, and ASA.  He did not fill the solaraze gel I rx'd for his nasal AK, too expensive. He asks that I freeze the lesion like had been done by his prior MD in the past.  Also has > 67mo hx of right shoulder pain.  Recalls reaching over/back behind her and felt a "pull".  No neck pain.  No tingling in arm, no weakness. Aches in upper back, right shoulder region at night.  Past Medical History  Diagnosis Date  . Hyperlipidemia   . COPD (chronic obstructive pulmonary disease)   . Diabetes mellitus   . Coronary atherosclerosis of native coronary vessel     S/P NSTEMI 04/11/2009--DEL stent to LAD.  Nuclear stress test 11/2010 NEG, EF normal    Past Surgical History  Procedure Date  . Spine surgery     L-Spine 2000, C-Spine 2004  . Cryotherapy to ak lesion on nose 2/11  . Colonoscopy 12/2010    Normal screening colonoscopy; rpt 10 yrs  . Eye exam,diabetic 02/2011    Normal diabetic retinopathy screening exam at [facility] in [place], .  . Cardiovascular stress test 11/2010    No ischemia/normal EF  . Coronary stent placement 04/2009    DES to LAD    Outpatient Prescriptions Prior to Visit  Medication Sig Dispense Refill  . aspirin 81 MG EC tablet Take 81 mg by mouth daily.        [name] glipiZIDE (GLUCOTROL) 10 MG tablet Take 10 mg by  mouth daily.        [name] HYDROcodone-acetaminophen (VICODIN) 5-500 MG per tablet TAKE ONE TO TWO TABLETS BY MOUTH EVERY 4 HOURS AS NEEDED  30 tablet  0  . lisinopril (PRINIVIL,ZESTRIL) 2.5 MG tablet TAKE ONE TABLET BY MOUTH EVERY DAY  30 tablet  0  . lovastatin (MEVACOR) 20 MG tablet TAKE ONE TABLET BY MOUTH AT BEDTIME  30 tablet  0  . metFORMIN (GLUCOPHAGE) 1000 MG tablet TAKE ONE TABLET BY MOUTH TWICE DAILY  60 tablet  0  . nitroGLYCERIN (NITROSTAT) 0.4 MG SL tablet Place 0.4 mg under the tongue every 5 (five) minutes as needed.        . Diclofenac Sodium (SOLARAZE) 3 % GEL Apply pea sized portion to lesion on nose twice daily for 90 days  100 g  5  . sitaGLIPtan-metformin (JANUMET) 50-1000 MG per tablet Take 1 tablet by mouth 2 (two) times daily with a meal.  60 tablet  5    Allergies  Allergen Reactions  . Simvastatin Other (See Comments)    Muscle pain    ROS As per HPI  PE: Blood pressure 112/76, pulse 65, temperature 97.7 F (36.5 C), temperature  source Oral, height 5\' 6"  (1.676 m), weight 133 lb (60.328 kg), SpO2 94.00%. Gen: Alert, well appearing.  Patient is oriented to person, place, time, and situation. Nose: bridge of nose with 2-52mm pink, mildly crusty papular lesion.   Chest: symmetric expansion, nonlabored respirations.  Clear and equal breath sounds in all lung fields.   CV: RRR, no m/r/g.   LUNGS: CTA bilat, nonlabored. Right shoulder: pain with IR and abduction, TTP in right upper trapezius extending somewhat into right subacromial area. Strength 5/5 prox and dist.  DTRs 1+ in biceps, triceps, and brachioradialis bilat.  LABS:  None today  IMPRESSION AND PLAN:  Right shoulder strain Muscular strain, possibly mild rotator cuff tendinopathy. Recommended diclofenac 75mg  bid x 10d, then bid prn. Home stretches discussed.  Actinic keratosis Cryotherapy done today x 15 seconds to entire lesion.    DIABETES MELLITUS Noncompliant with monitoring, diet, and  exercise. Financial difficulties make med adjustments very difficult. Check HbA1c today, urine microalb/cr, FLP. Continue current meds.  HYPERLIPIDEMIA FLP today. Continue statin.   Pt declined flu and pneumovax vaccines today.  FOLLOW UP: Return in about 4 months (around 01/27/2012) for f/u DM and hyperlipidemia.

## 2011-09-26 NOTE — Assessment & Plan Note (Signed)
Noncompliant with monitoring, diet, and exercise. Financial difficulties make med adjustments very difficult. Check HbA1c today, urine microalb/cr, FLP. Continue current meds.

## 2011-09-26 NOTE — Assessment & Plan Note (Signed)
Cryotherapy done today x 15 seconds to entire lesion.

## 2011-09-30 ENCOUNTER — Telehealth: Payer: Self-pay | Admitting: Family Medicine

## 2011-09-30 MED ORDER — GLIPIZIDE 5 MG PO TABS: ORAL_TABLET | ORAL | Status: DC

## 2011-09-30 NOTE — Telephone Encounter (Signed)
Pt returning call documented in "result note".  Conversation documented in result note.

## 2011-09-30 NOTE — Progress Notes (Signed)
Pt notified of results.  Pt voices understanding and is agreeable with plan. [facility] [place].

## 2011-09-30 NOTE — Telephone Encounter (Signed)
Please contact patient with lab results.

## 2011-09-30 NOTE — Telephone Encounter (Signed)
Orders only encounter--PM 

## 2011-10-21 ENCOUNTER — Other Ambulatory Visit: Payer: Self-pay | Admitting: Family Medicine

## 2011-10-21 ENCOUNTER — Encounter: Payer: Self-pay | Admitting: Family Medicine

## 2011-10-21 DIAGNOSIS — E1142 Type 2 diabetes mellitus with diabetic polyneuropathy: Secondary | ICD-10-CM

## 2011-10-21 NOTE — Telephone Encounter (Signed)
Last seen on 10/22, follow up 4 months scheduled for 01/26/12.  Refills sent through that time for metformin, lisinopril and lovastatin.  Please advise for hydrocodone.

## 2011-10-21 NOTE — Telephone Encounter (Signed)
Hydrocodone RX faxed.

## 2011-12-06 DIAGNOSIS — N183 Chronic kidney disease, stage 3 unspecified: Secondary | ICD-10-CM

## 2011-12-06 HISTORY — DX: Chronic kidney disease, stage 3 unspecified: N18.30

## 2012-01-26 ENCOUNTER — Ambulatory Visit: Payer: Medicare Other | Admitting: Family Medicine

## 2012-02-21 ENCOUNTER — Other Ambulatory Visit: Payer: Self-pay | Admitting: Family Medicine

## 2012-02-21 NOTE — Telephone Encounter (Signed)
eScribe request for refill on Metformin Last seen on 09/26/11 Follow up cancelled on 01/26/12 RX sent for 30 day supply.  Message left on home answering machine to return call.

## 2012-02-22 NOTE — Telephone Encounter (Signed)
RC from pt.  appt scheduled for 02/27/12.

## 2012-02-27 ENCOUNTER — Ambulatory Visit: Payer: Medicare Other | Admitting: Family Medicine

## 2012-03-06 ENCOUNTER — Ambulatory Visit: Payer: Medicare Other | Admitting: Family Medicine

## 2012-03-08 ENCOUNTER — Other Ambulatory Visit: Payer: Self-pay | Admitting: Family Medicine

## 2012-03-08 NOTE — Telephone Encounter (Signed)
eScribe request for refill on lisinopril Last filled 10/21/11, 30 x 3  eScribe request for refill on lovastatin Last filled 10/21/11, 30 x 3  eScribe request for refill on Metformin Last filled 02/22/12, 30 x 0  Last seen on 09/26/11 Follow up needed 01/2012, scheduled for 03/14/12 RX sent for 30 days only.  Pt needs appt.

## 2012-03-14 ENCOUNTER — Encounter: Payer: Self-pay | Admitting: Family Medicine

## 2012-03-14 ENCOUNTER — Ambulatory Visit (INDEPENDENT_AMBULATORY_CARE_PROVIDER_SITE_OTHER): Payer: Medicare Other | Admitting: Family Medicine

## 2012-03-14 VITALS — BP 110/70 | HR 64 | Temp 98.0°F | Ht 66.0 in | Wt 132.0 lb

## 2012-03-14 DIAGNOSIS — S46911A Strain of unspecified muscle, fascia and tendon at shoulder and upper arm level, right arm, initial encounter: Secondary | ICD-10-CM

## 2012-03-14 DIAGNOSIS — E785 Hyperlipidemia, unspecified: Secondary | ICD-10-CM

## 2012-03-14 DIAGNOSIS — Z125 Encounter for screening for malignant neoplasm of prostate: Secondary | ICD-10-CM | POA: Insufficient documentation

## 2012-03-14 DIAGNOSIS — IMO0002 Reserved for concepts with insufficient information to code with codable children: Secondary | ICD-10-CM

## 2012-03-14 DIAGNOSIS — E119 Type 2 diabetes mellitus without complications: Secondary | ICD-10-CM

## 2012-03-14 LAB — COMPREHENSIVE METABOLIC PANEL
Albumin: 4.7 g/dL (ref 3.5–5.2)
BUN: 18 mg/dL (ref 6–23)
CO2: 26 mEq/L (ref 19–32)
Calcium: 9.8 mg/dL (ref 8.4–10.5)
Chloride: 104 mEq/L (ref 96–112)
GFR: 55.19 mL/min — ABNORMAL LOW (ref >60.00)
Glucose, Bld: 139 mg/dL — ABNORMAL HIGH (ref 70–99)
Potassium: 5.1 mEq/L (ref 3.5–5.1)
Sodium: 144 mEq/L (ref 135–145)
Total Protein: 7.4 g/dL (ref 6.0–8.3)

## 2012-03-14 LAB — LIPID PANEL: HDL: 35.3 mg/dL — ABNORMAL LOW (ref >39.00)

## 2012-03-14 LAB — PSA: PSA: 0.51 ng/mL (ref 0.10–4.00)

## 2012-03-14 MED ORDER — GLIPIZIDE 5 MG PO TABS: ORAL_TABLET | ORAL | Status: DC

## 2012-03-14 MED ORDER — METFORMIN HCL 1000 MG PO TABS: 1000.0000 mg | ORAL_TABLET | Freq: Two times a day (BID) | ORAL | Status: DC

## 2012-03-14 MED ORDER — LOVASTATIN 20 MG PO TABS: 20.0000 mg | ORAL_TABLET | Freq: Every day | ORAL | Status: DC

## 2012-03-14 MED ORDER — LISINOPRIL 2.5 MG PO TABS: 2.5000 mg | ORAL_TABLET | Freq: Every day | ORAL | Status: DC

## 2012-03-14 NOTE — Assessment & Plan Note (Signed)
Noncompliant with monitoring and diet as per his usual.  Takes meds fine. Check HbA1c today, foot exam normal today but he does have painful sx's of peripheral neuropathy. Due for D.R screening exam: he'll call and make appt with his opthalmologist. Urine microalb/cr UTD. Continue current meds for now.  If HbA1c rising again he'll either have to start aggressive diabetic diet or insulin.

## 2012-03-14 NOTE — Assessment & Plan Note (Signed)
Mild but responsive to diclofenac.  However he couldn't tolerate the nausea and diarrhea that diclofenac was causing. Will try topical tx: compounded diclofenac, bupivicaine, baclofen, and gabapentin through dermatop.

## 2012-03-14 NOTE — Progress Notes (Signed)
OFFICE VISIT  03/14/2012   CC:  Chief Complaint  Patient presents with  . Follow-up    DM, HTN, Hyperlipidemia     HPI:    Patient is a 66 y.o. Caucasian male who presents for 6 mo f/u DM 2. Noncompliant with diet and monitoring as per his usual. Compliant with all meds. Denies polyuria or polydipsia. He is active in his yard, also works part-time at [employer].  Right shoulder still painful most days, esp with reaching behind him or overhead repetitively.  Also hurts when he lies on right shoulder in bed, often has to get up out of bed.  No neck pain.  No paresthesias.  No arm weakness.  He responded well to the diclofenac rx'd last visit BUT says he stopped it b/c he had nausea and diarrhea on the med.  Has chronic feeling of "irritating pain" in all of his toes that goes away with 1/2 of a vicodin qd, rarely takes a whole one.    ROS: no cp, no sob, no vision changes, no palpitations.  Past Medical History  Diagnosis Date  . Hyperlipidemia   . COPD (chronic obstructive pulmonary disease)   . Diabetes mellitus   . Coronary atherosclerosis of native coronary vessel     S/P NSTEMI 04/11/2009--DEL stent to LAD.  Nuclear stress test 11/2010 NEG, EF normal  . Diabetic peripheral neuropathy     Painful; 1 vicodin bid very helpful (pt resistant to alternative tx's)    Past Surgical History  Procedure Date  . Spine surgery     L-Spine 2000, C-Spine 2004  . Cryotherapy to ak lesion on nose 2/11  . Colonoscopy 12/2010    Normal screening colonoscopy; rpt 10 yrs  . Eye exam,diabetic 02/2011    Normal diabetic retinopathy screening exam at [facility] in [place], [place].  . Cardiovascular stress test 11/2010    No ischemia/normal EF  . Coronary stent placement 04/2009    DES to LAD    Outpatient Prescriptions Prior to Visit  Medication Sig Dispense Refill  . aspirin 81 MG EC tablet Take 81 mg by mouth daily.        [name] HYDROcodone-acetaminophen (VICODIN) 5-500 MG per  tablet 1 tab po bid for neuropathic pain in feet  60 tablet  3  . nitroGLYCERIN (NITROSTAT) 0.4 MG SL tablet Place 0.4 mg under the tongue every 5 (five) minutes as needed.        . diclofenac (VOLTAREN) 75 MG EC tablet 1 tab po bid x 10d, then 1 tab po bid prn shoulder pain  60 tablet  1  . glipiZIDE (GLUCOTROL) 5 MG tablet 2 tabs po qAM and 1 tab po with evening meal  270 tablet  1  . lisinopril (PRINIVIL,ZESTRIL) 2.5 MG tablet TAKE ONE TABLET BY MOUTH EVERY DAY  30 tablet  0  . lovastatin (MEVACOR) 20 MG tablet TAKE ONE TABLET BY MOUTH EVERY DAY AT BEDTIME  30 tablet  0  . metFORMIN (GLUCOPHAGE) 1000 MG tablet TAKE ONE TABLET BY MOUTH TWICE DAILY  60 tablet  0  *Also takes omeprazole 20mg  qd (OTC)  Allergies  Allergen Reactions  . Simvastatin Other (See Comments)    Muscle pain    ROS As per HPI  PE: Blood pressure 110/70, pulse 64, temperature 98 F (36.7 C), temperature source Temporal, height 5\' 6"  (1.676 m), weight 132 lb (59.875 kg). Gen: Alert, well appearing.  Patient is oriented to person, place, time, and situation.  Neck - No masses or thyromegaly or limitation in range of motion.  Carotids 2+ bilat, without bruit. CV: RRR, no m/r/g.   LUNGS: CTA bilat, nonlabored resps, good aeration in all lung fields. EXT: no clubbing, cyanosis, or edema.  FEET: DP and PT pulses 2+ bilat, no edema or skin breakdown or calluses.  No rash.  Sensation intact to monofilament testing in all areas. Right shoulder: mild TTP in subacromial region, otherwise nontender.  +Impingement pain and pain with IR and aBduction, negative drop sign testing.    LABS:  None today  IMPRESSION AND PLAN:  DIABETES MELLITUS Noncompliant with monitoring and diet as per his usual.  Takes meds fine. Check HbA1c today, foot exam normal today but he does have painful sx's of peripheral neuropathy. Due for D.R screening exam: he'll call and make appt with his opthalmologist. Urine microalb/cr UTD. Continue  current meds for now.  If HbA1c rising again he'll either have to start aggressive diabetic diet or insulin.  Right shoulder strain Mild but responsive to diclofenac.  However he couldn't tolerate the nausea and diarrhea that diclofenac was causing. Will try topical tx: compounded diclofenac, bupivicaine, baclofen, and gabapentin through dermatop.  HYPERLIPIDEMIA Repeat FLP today.  If similar to last check will need increase in atorvastatin.  Prostate cancer screening Says he has not had this before. DRE today normal. PSA ordered.     FOLLOW UP: Return in about 4 months (around 07/14/2012) for f/u DM, shoulder strain.

## 2012-03-14 NOTE — Assessment & Plan Note (Signed)
Says he has not had this before. DRE today normal. PSA ordered.

## 2012-03-14 NOTE — Assessment & Plan Note (Signed)
Repeat FLP today.  If similar to last check will need increase in atorvastatin.

## 2012-03-16 LAB — HEMOGLOBIN A1C: Hgb A1c MFr Bld: 8.1 % — ABNORMAL HIGH (ref 4.6–6.5)

## 2012-04-02 ENCOUNTER — Telehealth: Payer: Self-pay | Admitting: Family Medicine

## 2012-04-02 NOTE — Telephone Encounter (Signed)
Pt notified and is agreeable. 

## 2012-04-02 NOTE — Telephone Encounter (Signed)
Please advise referral?  

## 2012-04-02 NOTE — Telephone Encounter (Signed)
OK, ordered medical nutrition therapy. Needs office f/u and recheck of labs in 3-4 mo.

## 2012-04-12 ENCOUNTER — Encounter: Payer: Self-pay | Admitting: Family Medicine

## 2012-04-12 ENCOUNTER — Ambulatory Visit (INDEPENDENT_AMBULATORY_CARE_PROVIDER_SITE_OTHER): Payer: Medicare Other | Admitting: Family Medicine

## 2012-04-12 VITALS — Ht 66.0 in | Wt 132.6 lb

## 2012-04-12 DIAGNOSIS — E119 Type 2 diabetes mellitus without complications: Secondary | ICD-10-CM

## 2012-04-12 NOTE — Progress Notes (Signed)
Medical Nutrition Therapy:  Appt start time: 1530 end time:  1630.  Assessment:  Primary concerns today: Blood sugar control.  Derrick [name] was accompanied by his wife Derrick [name] today.  He was diagnosed with DM in 2006, but has never been referred to RD for MNT or DSMT.   Usual eating pattern includes 3 meals and 3-4 snacks per day.  He is not checking blood glucose at home, although he has a meter and knows how to use it.  Derrick [name] has had his legs amputated b/c of complications of DM, so he does know the potential consequences of poor glycemic control, although he did not have an understanding of how hyperglycemia increases risk of such complications until today's explanation.   Derrick [name] seems highly motivated to help her husband make the recommended changes.    Everyday foods include ~48 oz diet Dr Reino Kent, coffee w/ Stevia & 1 tbsp [employer]) flavored creamer, 2 c Honey Nut Cheerios, 2 c 2% milk.  Avoided foods include zucchini, non-fried fish.    24-hr recall: B (8 AM)-   3 canned biscuits w/ 1 tsp jelly on each, 16 oz Diet Dr Reino Kent Snk ( AM)-   none L (1:30 PM)-  2 oz BBQ tenderloin pork roast, 1/2 c pinto beans, 1/2 c mashed potatoes, 1/2 c mac & cheese, Diet Dr Reino Kent, 1 c choc ice cream Snk (6 PM)-  1/2 c Choc refrigerator cake (brownie & pudding, whipped crm) Snk (6:30)-  2 bags pork skins, Diet Dr Reino Kent  Snk ( PM)-  none  Usual physical activity includes "not much lately" b/c of right hip pain, for which he plans to follow up with an orthopedist.    Progress Towards Goal(s):  In progress.   Nutritional Diagnosis:  NI-5.8.2 Excessive carbohydrate intake As related to glycemia.  As evidenced by yesterday's intake of exclusively starch breakfast, 3 starches for lunch, and two desserts.    Intervention:  Nutrition education.  Monitoring/Evaluation:  Dietary intake, exercise, FBG, and body weight in 5 weeks.

## 2012-04-12 NOTE — Patient Instructions (Addendum)
-   Usually you can tell when your blood sugar drops, but we don't always have good clues about when it's high until it is extremely high.   - Greens:  Try them sauteed in olive oil and garlic.   - Continue your usual three meals a day plus snacks.   - Eat at least 3 meals and 1-2 snacks per day.  Aim for no more than 5 hours between eating. - Limit starch foods to two per meal and one per snack.    - There are basically 3 types of carbohydrate:  Starch, sugar, and fiber.  Only starch & sugar raise your blood sugar.    - Starch foods:  Bread, potatoes, corn, pasta, rice, cereal, muffins, rolls, biscuits, pretzels, crackers, all bakery foods.    - One starch portion (also called EXCHANGES) =    - 1 slice of bread or its equivalent    - 1/2 cup of a scoopable carb food   - 1 ounce (28 grams) of a starchy snack food (i.e., pretzels)    - 15 grams of total carbohydrate on the food label  - Check your fasting blood sugars daily (before you eat breakfast).   - Record your sugars each day.  On days you have an especially high or low blood sugar, figure out why it's high or low.   - Check out the websites [place].com, [employer] website too.   - Also Google "Lafonda Mosses." - If you find that you are having hypoglycemic events, be sure you make an appointment with Dr. Marvel Plan to follow up.

## 2012-04-24 DIAGNOSIS — H269 Unspecified cataract: Secondary | ICD-10-CM

## 2012-04-24 HISTORY — DX: Unspecified cataract: H26.9

## 2012-04-25 ENCOUNTER — Encounter: Payer: Self-pay | Admitting: Family Medicine

## 2012-05-01 ENCOUNTER — Other Ambulatory Visit: Payer: Self-pay | Admitting: *Deleted

## 2012-05-01 NOTE — Telephone Encounter (Signed)
Voicemail from patient requesting a return call.  RC to patient.  Pt is not at home, but pt wife says she thinks the call was for glucose strips and lancets, but pt has several meters from mail order companies.  Advised I will call patient back in the morning to clarify which meter he would like supplies for.  She is agreeable.

## 2012-05-02 ENCOUNTER — Telehealth: Payer: Self-pay | Admitting: Family Medicine

## 2012-05-02 MED ORDER — PRODIGY LANCETS 28G MISC: Status: DC

## 2012-05-02 MED ORDER — PRODIGY AUTOCODE BLOOD GLUCOSE VI STRP: ORAL_STRIP | Status: DC

## 2012-05-02 NOTE — Telephone Encounter (Signed)
Call questions to be answered in 5/28 refill encounter.

## 2012-05-02 NOTE — Telephone Encounter (Signed)
Verified with patient type of meter, quantity and pharmacy.  RX sent.

## 2012-05-09 ENCOUNTER — Encounter: Payer: Self-pay | Admitting: Family Medicine

## 2012-05-09 ENCOUNTER — Ambulatory Visit (INDEPENDENT_AMBULATORY_CARE_PROVIDER_SITE_OTHER): Payer: Medicare Other | Admitting: Family Medicine

## 2012-05-09 VITALS — BP 109/73 | HR 71 | Temp 97.8°F | Ht 66.0 in | Wt 132.0 lb

## 2012-05-09 DIAGNOSIS — L03019 Cellulitis of unspecified finger: Secondary | ICD-10-CM

## 2012-05-09 DIAGNOSIS — L03012 Cellulitis of left finger: Secondary | ICD-10-CM | POA: Insufficient documentation

## 2012-05-09 MED ORDER — CEPHALEXIN 500 MG PO CAPS: ORAL_CAPSULE | ORAL | Status: DC

## 2012-05-09 NOTE — Progress Notes (Signed)
OFFICE NOTE  05/09/2012  CC:  Chief Complaint  Patient presents with  . infected finger    X 2 weeks- left pointer finger     HPI: Patient is a 66 y.o. Caucasian male who is here for left hand index finger pain, swelling, redness around nail border medially.  Onset spontaneously 2 wks ago, no injury.   No fever or malaise. He is left handed, has been doing some brush painting lately with left hand and says left wrist is sore with flexion/extension.  Pertinent PMH:  Past Medical History  Diagnosis Date  . Hyperlipidemia   . COPD (chronic obstructive pulmonary disease)   . Diabetes mellitus   . Coronary atherosclerosis of native coronary vessel     S/P NSTEMI 04/11/2009--DEL stent to LAD.  Nuclear stress test 11/2010 NEG, EF normal  . Diabetic peripheral neuropathy     Painful; 1 vicodin bid very helpful (pt resistant to alternative tx's)  . Early cataracts, bilateral 04/24/12    [name] in [facility] [place], [place]    MEDS:  Outpatient Prescriptions Prior to Visit  Medication Sig Dispense Refill  . aspirin 81 MG EC tablet Take 81 mg by mouth daily.        [name] glipiZIDE (GLUCOTROL) 5 MG tablet 10 mg. 2 tabs po qAM and 1 tab po with evening meal      . HYDROcodone-acetaminophen (VICODIN) 5-500 MG per tablet 1 tab po bid for neuropathic pain in feet  60 tablet  3  . lisinopril (PRINIVIL,ZESTRIL) 2.5 MG tablet Take 1 tablet (2.5 mg total) by mouth daily.  90 tablet  0  . lovastatin (MEVACOR) 20 MG tablet Take 1 tablet (20 mg total) by mouth at bedtime.  90 tablet  0  . metFORMIN (GLUCOPHAGE) 1000 MG tablet Take 1 tablet (1,000 mg total) by mouth 2 (two) times daily with a meal.  180 tablet  0  . nitroGLYCERIN (NITROSTAT) 0.4 MG SL tablet Place 0.4 mg under the tongue every 5 (five) minutes as needed.        [name] omeprazole (PRILOSEC) 20 MG capsule Take 20 mg by mouth daily.      [name] PRODIGY AUTOCODE TEST test strip Use as instructed to check blood sugar.  DX 250.00  100 each  3  . PRODIGY  LANCETS 28G MISC Use as directed to check blood sugar. DX 250.00  100 each  3    PE: Blood pressure 109/73, pulse 71, temperature 97.8 F (36.6 C), temperature source Temporal, height 5\' 6"  (1.676 m), weight 132 lb (59.875 kg), SpO2 99.00%. Gen: Alert, well appearing.  Patient is oriented to person, place, time, and situation. Left hand index finger with mild erythema, swelling, and tenderness at the medial edge of the nail/border. No streaking.  ROM of digits intact.  Nail appears normal.  IMPRESSION AND PLAN:  Paronychia of finger, left Left hand index finger with mild paronychium. Conservative mgmt first: hot soaks 20 min qd-bid, tylenol for pain, and I'll get him started on keflex 500mg  tid x 10d. If worsening or if not improved in 5-7d then he'll return for incision/drainage.      FOLLOW UP: prn

## 2012-05-09 NOTE — Assessment & Plan Note (Signed)
Left hand index finger with mild paronychium. Conservative mgmt first: hot soaks 20 min qd-bid, tylenol for pain, and I'll get him started on keflex 500mg  tid x 10d. If worsening or if not improved in 5-7d then he'll return for incision/drainage.

## 2012-05-09 NOTE — Patient Instructions (Signed)
Soak your finger in hot water for 20 minutes 1-2 times per day.   Take [phone] mg tylenol every 6 hours as needed for pain. Call or return in 5-7d if not improving, or call or return anytime if your finger is getting worse or you develop temp > 100.5 or start to feel ill/sick.

## 2012-05-14 ENCOUNTER — Other Ambulatory Visit: Payer: Self-pay | Admitting: Family Medicine

## 2012-05-14 ENCOUNTER — Encounter: Payer: Self-pay | Admitting: Family Medicine

## 2012-05-14 ENCOUNTER — Ambulatory Visit (INDEPENDENT_AMBULATORY_CARE_PROVIDER_SITE_OTHER): Payer: Medicare Other | Admitting: Family Medicine

## 2012-05-14 VITALS — Ht 66.0 in | Wt 131.2 lb

## 2012-05-14 DIAGNOSIS — E119 Type 2 diabetes mellitus without complications: Secondary | ICD-10-CM

## 2012-05-14 NOTE — Telephone Encounter (Signed)
eScribe request for refill on hydrocodone Last filled 10/21/11, #60 x 3 Last seen on 03/14/12 Follow up in 4 months on 07/17/12 Please advise refills.

## 2012-05-14 NOTE — Patient Instructions (Addendum)
-   If your fasting blood glucose is above 150, figure out what you last ate, what time it was, and how much.  Make a mental note about the effect of that food on your blood sugar.   - Bananas will raise your blood sugar more than a lot of other fruits.  It's probably a good idea to limit portions to 1/2 banana or junior size.  If you eat a banana sandwich, it's best to include some peanut butter for a source of protein and fat.  The best peanut butter is natural, the one with oil on top.   - As a lower-glycemic starch than rice or white potatoes, try some roasted root veg's such as carrots, parsnips, sweet potatoes, beets, onion, and garlic.   - Continue with the changes you have made, including limiting starch to two portions per meal, and recording daily fasting blood sugars.   FROM LAST APPT:  - Check out the websites [place].com, [employer] website too.  - Also Google "Lafonda Mosses and Sugar."  - If you continue to have a hypoglycemic event more than once a week, make an appointment with Dr. Marvel Plan to follow up.

## 2012-05-14 NOTE — Progress Notes (Signed)
Medical Nutrition Therapy:  Appt start time: 0900 end time:  1000.  Assessment:  Primary concerns today: Blood sugar control.  Derrick [name] was accompanied by his wife Derrick [name] again today.  They were enthusiastic re dietary changes they've made, including a lot more veg's and greens, reduced intake of sweets, and limiting starches to two per meal.  They have been grilling more as well (d/t broken stove, but getting a new one this week).  Derrick [name] is now checking FBG daily, and brought his log book with him today.  FBG have been 91-158, with most in 100-teens to 130s.  Derrick [name] has had two hypoglycemic events, and started skipping AM dose of both metformin and glypizide when FBG is in the 90s, but has consistent with med's otherwise.  He follows up with Dr. Marvel Plan in Aug, and said he will go in sooner if hypoglycemic events continue.     Progress Towards Goal(s):  Some progress.   Nutritional Diagnosis: Progress noted on NI-5.8.2 Excessive carbohydrate intake As related to glycemia.  As evidenced by improved glcemic control as reflected in daily FBG.    Intervention:  Nutrition education.  Monitoring/Evaluation:  Dietary intake, exercise, FBG, and body weight in 5 weeks.

## 2012-05-17 ENCOUNTER — Other Ambulatory Visit: Payer: Self-pay | Admitting: *Deleted

## 2012-05-17 NOTE — Telephone Encounter (Signed)
Faxed

## 2012-05-17 NOTE — Telephone Encounter (Signed)
Encounter opened in error.  Refill encounter opened on 05/14/12.  Waiting approval.

## 2012-06-18 ENCOUNTER — Ambulatory Visit: Payer: Medicare Other | Admitting: Family Medicine

## 2012-07-10 ENCOUNTER — Other Ambulatory Visit: Payer: Self-pay | Admitting: Family Medicine

## 2012-07-10 NOTE — Telephone Encounter (Signed)
eScribe request for refill on Lisinopril Last filled - 03/14/12, #90 x 0 RX sent  eScribe request for refill on metformin Last filled - 03/14/12, #90 x 0 RX sent  eScribe request for refill on lovastatin Last filled - 03/14/12, #90 x 0 Last seen on - 03/14/12 Follow up - 07/26/12 RX sent  Pt has been following up with nutritionist.  RX's sent.

## 2012-07-17 ENCOUNTER — Ambulatory Visit: Payer: Medicare Other | Admitting: Family Medicine

## 2012-07-26 ENCOUNTER — Ambulatory Visit (INDEPENDENT_AMBULATORY_CARE_PROVIDER_SITE_OTHER): Payer: Medicare Other | Admitting: Family Medicine

## 2012-07-26 ENCOUNTER — Encounter: Payer: Self-pay | Admitting: Family Medicine

## 2012-07-26 VITALS — BP 110/70 | HR 71 | Ht 66.0 in | Wt 132.0 lb

## 2012-07-26 DIAGNOSIS — IMO0002 Reserved for concepts with insufficient information to code with codable children: Secondary | ICD-10-CM

## 2012-07-26 DIAGNOSIS — Z125 Encounter for screening for malignant neoplasm of prostate: Secondary | ICD-10-CM

## 2012-07-26 DIAGNOSIS — E119 Type 2 diabetes mellitus without complications: Secondary | ICD-10-CM

## 2012-07-26 DIAGNOSIS — E785 Hyperlipidemia, unspecified: Secondary | ICD-10-CM

## 2012-07-26 DIAGNOSIS — I2510 Atherosclerotic heart disease of native coronary artery without angina pectoris: Secondary | ICD-10-CM

## 2012-07-26 DIAGNOSIS — S46911A Strain of unspecified muscle, fascia and tendon at shoulder and upper arm level, right arm, initial encounter: Secondary | ICD-10-CM

## 2012-07-26 LAB — COMPREHENSIVE METABOLIC PANEL
Alkaline Phosphatase: 47 U/L (ref 39–117)
BUN: 14 mg/dL (ref 6–23)
CO2: 28 mEq/L (ref 19–32)
GFR: 59.63 mL/min — ABNORMAL LOW (ref >60.00)
Glucose, Bld: 139 mg/dL — ABNORMAL HIGH (ref 70–99)
Total Bilirubin: 1.2 mg/dL (ref 0.3–1.2)
Total Protein: 7.3 g/dL (ref 6.0–8.3)

## 2012-07-26 MED ORDER — LOVASTATIN 40 MG PO TABS: 40.0000 mg | ORAL_TABLET | Freq: Every day | ORAL | Status: DC

## 2012-07-26 MED ORDER — NITROGLYCERIN 0.4 MG SL SUBL: 0.4000 mg | SUBLINGUAL_TABLET | SUBLINGUAL | Status: DC | PRN

## 2012-07-26 MED ORDER — METFORMIN HCL 1000 MG PO TABS: ORAL_TABLET | ORAL | Status: DC

## 2012-07-26 NOTE — Progress Notes (Signed)
OFFICE VISIT  07/29/2012   CC:  Chief Complaint  Patient presents with  . Follow-up    DM-has log; shoulder strain-"feeling fine"     HPI:    Patient is a 66 y.o. white male who presents for 4 mo DM 2 f/u and f/u shoulder strain. Has been on compounded anti-inflammitory cream.  Says shoulder is doing all right now and he isn't using cream anymore. Last visit his A1c was up but we were unable to get him to call us back to discuss results/plan. At any rate, he has seen a nutritionist since last visit and has made significant changes in his diet.  No change in activity level. Monitoring glucoses better now: fasting glucose daily is high 80s up to 150s, avg probably 120s. Cholesterol 4 mo ago was not at goal of HDL >40 and LDL <70 (hx of CAD). Discussed increasing mevacor today (had myalgias with atorv in the past). Denies any recent problems with CP or SOB.  Says he accidentally washed his nitroglycerin tabs in washing machine, needs new rx.  Past Medical History  Diagnosis Date  . Hyperlipidemia   . COPD (chronic obstructive pulmonary disease)   . Diabetes mellitus   . Coronary atherosclerosis of native coronary vessel     S/P NSTEMI 04/11/2009--DEL stent to LAD.  Nuclear stress test 11/2010 NEG, EF normal  . Diabetic peripheral neuropathy     Painful; 1 vicodin bid very helpful (pt resistant to alternative tx's)  . Early cataracts, bilateral 04/24/12    [name] in [place], [place]  . Herpes zoster     Two separate outbreaks--one on left axillary/back region, one on right axillary/back region  . Chronic renal insufficiency, stage III (moderate) 2013    CrCl est high 50s (borderline stage II/III     Past Surgical History  Procedure Date  . Spine surgery     L-Spine 2000, C-Spine 2004  . Cryotherapy to ak lesion on nose 2/11  . Colonoscopy 12/2010    Normal screening colonoscopy; rpt 10 yrs  . Eye exam,diabetic 02/2011    Normal diabetic retinopathy screening exam at [facility]  eye care in [place], [place].  . Cardiovascular stress test 11/2010    No ischemia/normal EF  . Coronary stent placement 04/2009    DES to LAD    Outpatient Prescriptions Prior to Visit  Medication Sig Dispense Refill  . aspirin 81 MG EC tablet Take 81 mg by mouth daily.        [name] glipiZIDE (GLUCOTROL) 5 MG tablet 2 tabs po qAM and 1 tab po with evening meal      . HYDROcodone-acetaminophen (VICODIN) 5-500 MG per tablet TAKE ONE TABLET BY MOUTH TWICE DAILY FOR  NEUROPATHIC  PAIN  IN  FEET  60 tablet  3  . lisinopril (PRINIVIL,ZESTRIL) 2.5 MG tablet TAKE ONE TABLET BY MOUTH EVERY DAY  90 tablet  0  . PRODIGY AUTOCODE TEST test strip Use as instructed to check blood sugar.  DX 250.00  100 each  3  . PRODIGY LANCETS 28G MISC Use as directed to check blood sugar. DX 250.00  100 each  3  . lovastatin (MEVACOR) 20 MG tablet TAKE ONE TABLET BY MOUTH AT BEDTIME  90 tablet  0  . metFORMIN (GLUCOPHAGE) 1000 MG tablet TAKE ONE TABLET BY MOUTH TWICE DAILY WITH MEALS  180 tablet  0  . nitroGLYCERIN (NITROSTAT) 0.4 MG SL tablet Place 0.4 mg under the tongue every 5 (five) minutes as  needed.        . cephALEXin (KEFLEX) 500 MG capsule 1 cap po tid x 10d  30 capsule  0  . omeprazole (PRILOSEC) 20 MG capsule Take 20 mg by mouth daily.        Allergies  Allergen Reactions  . Simvastatin Other (See Comments)    Muscle pain    ROS As per HPI  PE: Blood pressure 110/70, pulse 71, height 5\' 6"  (1.676 m), weight 132 lb (59.875 kg). Gen: Alert, well appearing.  Patient is oriented to person, place, time, and situation. AFFECT: pleasant, lucid thought and speech. No further exam today.  LABS:   Lab Results  Component Value Date   WBC 7.8 11/19/2010   HGB 13.8 11/19/2010   HCT 40.6 11/19/2010   MCV 91.8 11/19/2010   PLT 233.0 11/19/2010   Lab Results  Component Value Date   CREATININE 1.3 07/26/2012   BUN 14 07/26/2012   NA 135 07/26/2012   K 5.2* 07/26/2012   CL 100 07/26/2012   CO2 28 07/26/2012   Lab  Results  Component Value Date   ALT 26 07/26/2012   AST 25 07/26/2012   ALKPHOS 47 07/26/2012   BILITOT 1.2 07/26/2012   Lab Results  Component Value Date   CHOL 182 03/14/2012   Lab Results  Component Value Date   HDL 35.30* 03/14/2012   Lab Results  Component Value Date   LDLCALC 88 09/26/2011   Lab Results  Component Value Date   TRIG 216.0* 03/14/2012   Lab Results  Component Value Date   CHOLHDL 5 03/14/2012   Lab Results  Component Value Date   PSA 0.51 03/14/2012   Lab Results  Component Value Date   HGBA1C 7.7* 07/26/2012    IMPRESSION AND PLAN:  DIABETES MELLITUS Improved compliance with monitoring and diet.  HbA1c 10mo ago was up to 8.1% from 7.6%, and we were unable to communicate to him my recommendations. His home monitoring appears a bit better lately. Recheck HbA1c today.    HYPERLIPIDEMIA He didn't get the message after last f/u to start increased dose of mevacor (atorv caused myalgias in the past), so we'll increase him today to 40mg  qd.  CAD Problem stable.  Continue current medications and diet appropriate for this condition.  We have reviewed our general long term plan for this problem and also reviewed symptoms and signs that should prompt the patient to call or return to the office.   Right shoulder strain Resolved.     FOLLOW UP: Return in about 4 months (around 11/25/2012) for f/u DM 2, hyperlipidemia.

## 2012-07-27 ENCOUNTER — Encounter: Payer: Self-pay | Admitting: Family Medicine

## 2012-07-29 NOTE — Assessment & Plan Note (Signed)
DRE and PSA normal 03/2012.

## 2012-07-29 NOTE — Assessment & Plan Note (Addendum)
Improved compliance with monitoring and diet.  HbA1c 100mo ago was up to 8.1% from 7.6%, and we were unable to communicate to him my recommendations. His home monitoring appears a bit better lately. Recheck HbA1c today.

## 2012-07-29 NOTE — Assessment & Plan Note (Signed)
Problem stable.  Continue current medications and diet appropriate for this condition.  We have reviewed our general long term plan for this problem and also reviewed symptoms and signs that should prompt the patient to call or return to the office.  

## 2012-07-29 NOTE — Assessment & Plan Note (Signed)
He didn't get the message after last f/u to start increased dose of mevacor (atorv caused myalgias in the past), so we'll increase him today to 40mg  qd.

## 2012-07-29 NOTE — Assessment & Plan Note (Signed)
Resolved

## 2012-07-31 ENCOUNTER — Telehealth: Payer: Self-pay | Admitting: Family Medicine

## 2012-07-31 MED ORDER — GLIPIZIDE ER 10 MG PO TB24: 10.0000 mg | ORAL_TABLET | Freq: Every day | ORAL | Status: DC

## 2012-07-31 NOTE — Telephone Encounter (Signed)
Pls notify pt that his HbA1c was slightly improved (from 8.1% to 7.6%) but he still is not at the goal of <7%. Unfortunately, his kidneys don't function like when he was younger, and once your kidney function is lower than a certain cutoff then you can't take metformin anymore.  He has reached that point, so I have to recommend that he stop the metformin.  I want him to continue glipizide, but I want him to take a different form of it and different dose: Glipizide XL 10mg  once every morning.  If his fasting glucose is not down into the 100-110 range and his 2 hour postprandial glucose is not in the 140-160 range consistently over the next 2-3 weeks, then we'll need to start an additional injection med (either long acting insulin OR a med like byetta or victoza).  I'll eRx med.  F/u with glucose log in office in 3 wks.---thx

## 2012-08-28 NOTE — Telephone Encounter (Signed)
Patient's wife called back, she said patient will be home tomorrow (08/29/12) morning. Please call patient at home.

## 2012-08-28 NOTE — Telephone Encounter (Signed)
I have attempted to contact this patient by phone with the following results: left message to return my call on answering machine (home).  

## 2012-08-28 NOTE — Telephone Encounter (Signed)
Vm from wife returning call.  Left message to return call.

## 2012-08-29 NOTE — Telephone Encounter (Signed)
Pt notified of results and is agreeable.  He will call back to make appt when he knows his schedule.  [facility] at [facility] will take plain Glipizide RX out of profile.  They will make sure new Rx is ready.

## 2012-11-14 ENCOUNTER — Ambulatory Visit: Payer: Medicare Other | Admitting: Family Medicine

## 2012-11-15 ENCOUNTER — Ambulatory Visit (INDEPENDENT_AMBULATORY_CARE_PROVIDER_SITE_OTHER): Payer: Medicare Other | Admitting: Family Medicine

## 2012-11-15 ENCOUNTER — Encounter: Payer: Self-pay | Admitting: Family Medicine

## 2012-11-15 VITALS — BP 119/77 | HR 69 | Ht 66.0 in | Wt 130.0 lb

## 2012-11-15 DIAGNOSIS — J449 Chronic obstructive pulmonary disease, unspecified: Secondary | ICD-10-CM

## 2012-11-15 DIAGNOSIS — J4489 Other specified chronic obstructive pulmonary disease: Secondary | ICD-10-CM

## 2012-11-15 DIAGNOSIS — E118 Type 2 diabetes mellitus with unspecified complications: Secondary | ICD-10-CM | POA: Insufficient documentation

## 2012-11-15 DIAGNOSIS — E119 Type 2 diabetes mellitus without complications: Secondary | ICD-10-CM

## 2012-11-15 DIAGNOSIS — IMO0001 Reserved for inherently not codable concepts without codable children: Secondary | ICD-10-CM

## 2012-11-15 DIAGNOSIS — E785 Hyperlipidemia, unspecified: Secondary | ICD-10-CM

## 2012-11-15 LAB — HEMOGLOBIN A1C: Hgb A1c MFr Bld: 8.6 % — ABNORMAL HIGH (ref 4.6–6.5)

## 2012-11-15 LAB — COMPREHENSIVE METABOLIC PANEL
ALT: 20 U/L (ref 0–53)
CO2: 27 mEq/L (ref 19–32)
Calcium: 9.2 mg/dL (ref 8.4–10.5)
Chloride: 101 mEq/L (ref 96–112)
Creatinine, Ser: 1.4 mg/dL (ref 0.4–1.5)
GFR: 53.72 mL/min — ABNORMAL LOW (ref >60.00)
Sodium: 135 mEq/L (ref 135–145)
Total Protein: 7.3 g/dL (ref 6.0–8.3)

## 2012-11-15 MED ORDER — LISINOPRIL 2.5 MG PO TABS: 2.5000 mg | ORAL_TABLET | Freq: Every day | ORAL | Status: DC

## 2012-11-15 MED ORDER — LOVASTATIN 40 MG PO TABS: 40.0000 mg | ORAL_TABLET | Freq: Every day | ORAL | Status: DC

## 2012-11-15 MED ORDER — LIRAGLUTIDE 18 MG/3ML ~~LOC~~ SOLN: SUBCUTANEOUS | Status: DC

## 2012-11-15 MED ORDER — HYDROCODONE-ACETAMINOPHEN 5-500 MG PO TABS: 1.0000 | ORAL_TABLET | Freq: Two times a day (BID) | ORAL | Status: DC

## 2012-11-15 NOTE — Assessment & Plan Note (Signed)
Stable   Asymptomatic at this time  Pt to contact our office with name of abx used in past for URI  Also will prescribe pyridium to use prn

## 2012-11-15 NOTE — Assessment & Plan Note (Signed)
Not well controlled, dietary indiscretion and no exercise.  Will abandon sulfonylureas at this time--his pancrease may be empty of insulin. Renal impairment makes him unable to get metformin. Best next option is GLP 1 agonist--Victoza: 0.6mg  SQ qAM x 5d, then increase to 1.2 mg SQ qAM.  Monitor fasting and at least one postprandial glucose every day for the next two weeks and then f/u in office for review in 2 wks. Urine microalbumin/cr and HbA1c today.  Lytes/cr today.

## 2012-11-15 NOTE — Progress Notes (Signed)
OFFICE NOTE  11/15/2012  CC:  Chief Complaint  Patient presents with  . Follow-up    chol, DM; sugars up on new meds     HPI: Patient is a 66 y.o. Caucasian male who is here for 4 mo f/u DM 2, hyperlipidemia, COPD.   Says glucoses higher since increase in glucotrol XL to 10 mg, fastings 170s avg. Feeling good.  No breathing complaints, no cough. Compliant with mevacor 40mg .  No exercise. He is about 50% compliant with diabetic diet.  "I eat whatever I want, I love to eat".   Pertinent PMH:  Past Medical History  Diagnosis Date  . Hyperlipidemia   . COPD (chronic obstructive pulmonary disease)   . Diabetes mellitus   . Coronary atherosclerosis of native coronary vessel     S/P NSTEMI 04/11/2009--DEL stent to LAD.  Nuclear stress test 11/2010 NEG, EF normal  . Diabetic peripheral neuropathy     Painful; 1 vicodin bid very helpful (pt resistant to alternative tx's)  . Early cataracts, bilateral 04/24/12    [name] in [place], [place]  . Herpes zoster     Two separate outbreaks--one on left axillary/back region, one on right axillary/back region  . Chronic renal insufficiency, stage III (moderate) 2013    CrCl est high 50s (borderline stage II/III     MEDS:  Outpatient Prescriptions Prior to Visit  Medication Sig Dispense Refill  . aspirin 81 MG EC tablet Take 81 mg by mouth daily.        [name] glipiZIDE (GLUCOTROL XL) 10 MG 24 hr tablet Take 1 tablet (10 mg total) by mouth daily.  30 tablet  0  . HYDROcodone-acetaminophen (VICODIN) 5-500 MG per tablet TAKE ONE TABLET BY MOUTH TWICE DAILY FOR  NEUROPATHIC  PAIN  IN  FEET  60 tablet  3  . lisinopril (PRINIVIL,ZESTRIL) 2.5 MG tablet TAKE ONE TABLET BY MOUTH EVERY DAY  90 tablet  0  . lovastatin (MEVACOR) 40 MG tablet Take 1 tablet (40 mg total) by mouth at bedtime.  30 tablet  3  . nitroGLYCERIN (NITROSTAT) 0.4 MG SL tablet Place 1 tablet (0.4 mg total) under the tongue every 5 (five) minutes as needed.  20 tablet  1  . PRODIGY  AUTOCODE TEST test strip Use as instructed to check blood sugar.  DX 250.00  100 each  3  . PRODIGY LANCETS 28G MISC Use as directed to check blood sugar. DX 250.00  100 each  3   Last reviewed on 11/15/2012  8:11 AM by Jeoffrey Massed, MD  PE: Blood pressure 119/77, pulse 69, height 5\' 6"  (1.676 m), weight 130 lb (58.968 kg). Gen: Alert, well appearing.  Patient is oriented to person, place, time, and situation. CV: RRR, no m/r/g.   LUNGS: CTA bilat, nonlabored resps, good aeration in all lung fields.   IMPRESSION AND PLAN:  Type II or unspecified type diabetes mellitus without mention of complication, uncontrolled Not well controlled, dietary indiscretion and no exercise.  Will abandon sulfonylureas at this time--his pancrease may be empty of insulin. Renal impairment makes him unable to get metformin. Best next option is GLP 1 agonist--Victoza: 0.6mg  SQ qAM x 5d, then increase to 1.2 mg SQ qAM.  Monitor fasting and at least one postprandial glucose every day for the next two weeks and then f/u in office for review in 2 wks. Urine microalbumin/cr and HbA1c today.  Lytes/cr today.  HYPERLIPIDEMIA Not fasting today so we can't check FLP to  see if he responded to our increase in mavacor to 40mg  qd last visit. Will check AST/ALT. Will check FLP next visit when fasting.  COPD Stable.  Asymptomatic at this time.     FOLLOW UP: 2 wks

## 2012-11-15 NOTE — Assessment & Plan Note (Signed)
Not fasting today so we can't check FLP to see if he responded to our increase in mavacor to 40mg  qd last visit. Will check AST/ALT. Will check FLP next visit when fasting.

## 2012-11-29 ENCOUNTER — Ambulatory Visit (INDEPENDENT_AMBULATORY_CARE_PROVIDER_SITE_OTHER): Payer: Medicare Other | Admitting: Family Medicine

## 2012-11-29 ENCOUNTER — Encounter: Payer: Self-pay | Admitting: Family Medicine

## 2012-11-29 VITALS — BP 118/81 | HR 78 | Temp 98.3°F | Resp 16 | Wt 128.0 lb

## 2012-11-29 DIAGNOSIS — E785 Hyperlipidemia, unspecified: Secondary | ICD-10-CM

## 2012-11-29 DIAGNOSIS — E119 Type 2 diabetes mellitus without complications: Secondary | ICD-10-CM

## 2012-11-29 NOTE — Assessment & Plan Note (Signed)
Our lab person was not here this morning. He'll fast again next f/u visit in 2 wks and we'll recheck lipids at that time.

## 2012-11-29 NOTE — Patient Instructions (Addendum)
Continue your victoza every morning at 1.2mg  SQ dosing. Start Levemir insulin at 10 units every evening (bedtime) and adjust dose by 1 unit every couple of days to get to goal fasting sugar of 100-110. Check a glucose 2 hours after your largest meal every day and write # down.

## 2012-11-29 NOTE — Assessment & Plan Note (Signed)
Needs long acting insulin.  Needs some 2H PP glucoses to help determine if victoza is effective.  Continue your victoza every morning at 1.2mg  SQ dosing. Start Levemir insulin at 10 units every evening (bedtime) and adjust dose by 1 unit every couple of days to get to goal fasting sugar of 100-110. Check a glucose 2 hours after your largest meal every day and write # down.

## 2012-11-29 NOTE — Progress Notes (Signed)
OFFICE NOTE  11/29/2012  CC:  Chief Complaint  Patient presents with  . Follow-up  . Diabetes     HPI: Patient is a 66 y.o. Caucasian male who is here for 2 wk f/u DM2 and hyperlipidemia. Started Victoza 2 wks ago.  Needs FLP today to see if our last increase in mevacor was helpful. He has a hx of dietary noncompliance.   He did not check any 2H PPs since last visit.  His fasting avg is around 250-260. He has polyuria but no polydipsia.  No other complaints.  Pertinent PMH:  Past Medical History  Diagnosis Date  . Hyperlipidemia   . COPD (chronic obstructive pulmonary disease)   . Diabetes mellitus   . Coronary atherosclerosis of native coronary vessel     S/P NSTEMI 04/11/2009--DEL stent to LAD.  Nuclear stress test 11/2010 NEG, EF normal  . Diabetic peripheral neuropathy     Painful; 1 vicodin bid very helpful (pt resistant to alternative tx's)  . Early cataracts, bilateral 04/24/12    [name] in [place], [place]  . Herpes zoster     Two separate outbreaks--one on left axillary/back region, one on right axillary/back region  . Chronic renal insufficiency, stage III (moderate) 2013    CrCl est high 50s (borderline stage II/III    MEDS:  Outpatient Prescriptions Prior to Visit  Medication Sig Dispense Refill  . aspirin 81 MG EC tablet Take 81 mg by mouth daily.        [name] HYDROcodone-acetaminophen (VICODIN) 5-500 MG per tablet Take 1 tablet by mouth 2 (two) times daily.  60 tablet  3  . Liraglutide (VICTOZA) 18 MG/3ML SOLN 0.6mg  SQ qAM x 5d, then increase to 1.2 mg SQ qAM  3 mL  0  . lisinopril (PRINIVIL,ZESTRIL) 2.5 MG tablet Take 1 tablet (2.5 mg total) by mouth daily.  90 tablet  1  . lovastatin (MEVACOR) 40 MG tablet Take 1 tablet (40 mg total) by mouth at bedtime.  90 tablet  1  . nitroGLYCERIN (NITROSTAT) 0.4 MG SL tablet Place 1 tablet (0.4 mg total) under the tongue every 5 (five) minutes as needed.  20 tablet  1  . PRODIGY AUTOCODE TEST test strip Use as  instructed to check blood sugar.  DX 250.00  100 each  3  . PRODIGY LANCETS 28G MISC Use as directed to check blood sugar. DX 250.00  100 each  3  . glipiZIDE (GLUCOTROL XL) 10 MG 24 hr tablet Take 1 tablet (10 mg total) by mouth daily.  30 tablet  0  Last reviewed on 11/29/2012  8:19 AM by Jeoffrey Massed, MD  PE: Blood pressure 118/81, pulse 78, temperature 98.3 F (36.8 C), temperature source Oral, resp. rate 16, weight 128 lb (58.06 kg), SpO2 98.00%. Gen: Alert, well appearing.  Patient is oriented to person, place, time, and situation. No further exam today.  IMPRESSION AND PLAN:  DIABETES MELLITUS Needs long acting insulin.  Needs some 2H PP glucoses to help determine if victoza is effective.  Continue your victoza every morning at 1.2mg  SQ dosing. Start Levemir insulin at 10 units every evening (bedtime) and adjust dose by 1 unit every couple of days to get to goal fasting sugar of 100-110. Check a glucose 2 hours after your largest meal every day and write # down.    HYPERLIPIDEMIA Our lab person was not here this morning. He'll fast again next f/u visit in 2 wks and we'll recheck lipids at  that time.   An After Visit Summary was printed and given to the patient.  FOLLOW UP: 2 wks

## 2012-12-13 ENCOUNTER — Ambulatory Visit (INDEPENDENT_AMBULATORY_CARE_PROVIDER_SITE_OTHER): Payer: Medicare Other | Admitting: Family Medicine

## 2012-12-13 ENCOUNTER — Encounter: Payer: Self-pay | Admitting: Family Medicine

## 2012-12-13 ENCOUNTER — Telehealth: Payer: Self-pay | Admitting: Family Medicine

## 2012-12-13 VITALS — BP 85/60 | HR 76 | Wt 127.0 lb

## 2012-12-13 DIAGNOSIS — IMO0001 Reserved for inherently not codable concepts without codable children: Secondary | ICD-10-CM

## 2012-12-13 DIAGNOSIS — E785 Hyperlipidemia, unspecified: Secondary | ICD-10-CM

## 2012-12-13 LAB — LIPID PANEL
HDL: 38.3 mg/dL — ABNORMAL LOW (ref >39.00)
Total CHOL/HDL Ratio: 4

## 2012-12-13 MED ORDER — INSULIN PEN NEEDLE 32G X 4 MM MISC: Status: DC

## 2012-12-13 MED ORDER — INSULIN LISPRO 100 UNIT/ML ~~LOC~~ SOLN: SUBCUTANEOUS | Status: DC

## 2012-12-13 NOTE — Addendum Note (Signed)
Addended by: Luisa Dago on: 12/13/2012 03:45 PM   Modules accepted: Orders

## 2012-12-13 NOTE — Assessment & Plan Note (Signed)
Doing fine with levemir start up, encouraged him to continue prudent up-titration of dose to get to goal fasting sugar range of 100-110 consistently.   Victoza not helpful, so will d/c this and start humalog at mealtime: 5 units with each meal to start.  Check glucoses fasting, before lunch, before supper, and hs--write these down for review with me in 2 wks.  Goal 2H PP range is 140-150.

## 2012-12-13 NOTE — Assessment & Plan Note (Signed)
Lab Results  Component Value Date   CHOL 182 03/14/2012   HDL 35.30* 03/14/2012   LDLCALC 88 09/26/2011   LDLDIRECT 102.8 03/14/2012   TRIG 216.0* 03/14/2012   CHOLHDL 5 03/14/2012   Check FLP today.  Continue statin.

## 2012-12-13 NOTE — Telephone Encounter (Signed)
RX sent

## 2012-12-13 NOTE — Progress Notes (Signed)
OFFICE NOTE  12/13/2012  CC:  Chief Complaint  Patient presents with  . Diabetes    2 week follow up; sugars still up and down, fasting for lipid check     HPI: Patient is a 67 y.o. Caucasian male who is here for 1 mo f/u DM 2. Started levemir 2 wks ago, sugar review today shows moderate improvement in fasting glucoses, now down to 120s-130s avg, with occasional 90-100 reading.  Since starting victoza he has had no improvement in his 2H PP glucoses--he is averaging 200-300 range at these times.   He has always admitted that he makes no effort to eat a diabetic diet and he does no regular exercise. He reports feeling fine.  Pertinent PMH:  Past Medical History  Diagnosis Date  . Hyperlipidemia   . COPD (chronic obstructive pulmonary disease)   . Diabetes mellitus   . Coronary atherosclerosis of native coronary vessel     S/P NSTEMI 04/11/2009--DEL stent to LAD.  Nuclear stress test 11/2010 NEG, EF normal  . Diabetic peripheral neuropathy     Painful; 1 vicodin bid very helpful (pt resistant to alternative tx's)  . Early cataracts, bilateral 04/24/12    [name] in [place], [place]  . Herpes zoster     Two separate outbreaks--one on left axillary/back region, one on right axillary/back region  . Chronic renal insufficiency, stage III (moderate) 2013    CrCl est high 50s (borderline stage II/III    Past surgical, social, and family history reviewed and no changes noted since last office visit.  MEDS:  Levemir insulin 14 U SQ qhs Outpatient Prescriptions Prior to Visit  Medication Sig Dispense Refill  . glipiZIDE (GLUCOTROL XL) 10 MG 24 hr tablet Take 1 tablet (10 mg total) by mouth daily.  30 tablet  0  . HYDROcodone-acetaminophen (VICODIN) 5-500 MG per tablet Take 1 tablet by mouth 2 (two) times daily.  60 tablet  3  . lisinopril (PRINIVIL,ZESTRIL) 2.5 MG tablet Take 1 tablet (2.5 mg total) by mouth daily.  90 tablet  1  . lovastatin (MEVACOR) 40 MG tablet Take 1 tablet (40 mg  total) by mouth at bedtime.  90 tablet  1  . nitroGLYCERIN (NITROSTAT) 0.4 MG SL tablet Place 1 tablet (0.4 mg total) under the tongue every 5 (five) minutes as needed.  20 tablet  1  . PRODIGY AUTOCODE TEST test strip Use as instructed to check blood sugar.  DX 250.00  100 each  3  . PRODIGY LANCETS 28G MISC Use as directed to check blood sugar. DX 250.00  100 each  3  . [DISCONTINUED] Liraglutide (VICTOZA) 18 MG/3ML SOLN 0.6mg  SQ qAM x 5d, then increase to 1.2 mg SQ qAM  3 mL  0  . aspirin 81 MG EC tablet Take 81 mg by mouth daily.         Last reviewed on 12/13/2012  8:58 AM by Jeoffrey Massed, MD  PE: Blood pressure 85/60, pulse 76, weight 127 lb (57.607 kg). Gen: Alert, well appearing.  Patient is oriented to person, place, time, and situation. AFFECT: pleasant, lucid thought and speech. No further exam today.  IMPRESSION AND PLAN:  Type II or unspecified type diabetes mellitus without mention of complication, uncontrolled Doing fine with levemir start up, encouraged him to continue prudent up-titration of dose to get to goal fasting sugar range of 100-110 consistently.   Victoza not helpful, so will d/c this and start humalog at mealtime: 5 units with  each meal to start.  Check glucoses fasting, before lunch, before supper, and hs--write these down for review with me in 2 wks.  Goal 2H PP range is 140-150.  HYPERLIPIDEMIA Lab Results  Component Value Date   CHOL 182 03/14/2012   HDL 35.30* 03/14/2012   LDLCALC 88 09/26/2011   LDLDIRECT 102.8 03/14/2012   TRIG 216.0* 03/14/2012   CHOLHDL 5 03/14/2012   Check FLP today.  Continue statin.  An After Visit Summary was printed and given to the patient.  He declined flu vaccine again today.  FOLLOW UP: 2 wks

## 2012-12-13 NOTE — Telephone Encounter (Signed)
Please send in Rx for needles for his insulin

## 2012-12-13 NOTE — Patient Instructions (Signed)
Stop Victoza.  Continue levemir and continue to slowly adjust dose to get fasting glucose in the range of 100-110.  Measure glucose before breakfast, dinner, and supper and before bedtime. Start novolog insulin 5 units with each meal.  Your goal glucose range pre-meal time is 140-150.   Adjust novolog dose by 1-2 units at a time to get your after-meal glucoses in this range. At bedtime do not take novolog but continue to take your levemir.

## 2012-12-28 ENCOUNTER — Encounter: Payer: Self-pay | Admitting: Family Medicine

## 2012-12-28 ENCOUNTER — Ambulatory Visit (INDEPENDENT_AMBULATORY_CARE_PROVIDER_SITE_OTHER): Payer: Medicare Other | Admitting: Family Medicine

## 2012-12-28 VITALS — BP 126/74 | HR 69 | Ht 66.0 in | Wt 131.0 lb

## 2012-12-28 DIAGNOSIS — IMO0001 Reserved for inherently not codable concepts without codable children: Secondary | ICD-10-CM

## 2012-12-28 MED ORDER — INSULIN DETEMIR 100 UNIT/ML ~~LOC~~ SOLN: 14.0000 [IU] | Freq: Every day | SUBCUTANEOUS | Status: DC

## 2012-12-28 MED ORDER — INSULIN LISPRO 100 UNIT/ML ~~LOC~~ SOLN: SUBCUTANEOUS | Status: DC

## 2012-12-28 NOTE — Assessment & Plan Note (Addendum)
Control still poor.  Noncompliant with diet/exercise.   Encouraged him to self manage/titrate slowly to get better control slowly and avoid hypoglycemia.  Increase levemir to 16 units qhs. Increase humalog to 10 Units at BF, 12 Units at Lunch/dinner, and 14 units at supper.  Goal fasting glucose is 100-110 consistently.  Goal glucose level 2 hours after eating is 140-160.

## 2012-12-28 NOTE — Patient Instructions (Addendum)
Increase levemir to 16 units qhs. Increase humalog to 10 Units at BF, 12 Units at Lunch/dinner, and 14 units at supper.  Goal fasting glucose is 100-110 consistently.  Goal glucose level 2 hours after eating is 140-160.

## 2012-12-28 NOTE — Progress Notes (Signed)
OFFICE NOTE  12/28/2012  CC:  Chief Complaint  Patient presents with  . Follow-up    2 week follow up DM and insulin     HPI: Patient is a 67 y.o. Caucasian male who is here for 2 week f/u uncontrolled DM 2.   Levemir and humalog recently added.  Lab Results  Component Value Date   HGBA1C 8.6* 11/15/2012  Review of glucose log from the last 2 weeks continue to show fastings from 95 up to 267, and some 2HPP anywhere from 105 up to 300s to 400s    Pertinent PMH:  Past Medical History  Diagnosis Date  . Hyperlipidemia   . COPD (chronic obstructive pulmonary disease)   . Diabetes mellitus   . Coronary atherosclerosis of native coronary vessel     S/P NSTEMI 04/11/2009--DEL stent to LAD.  Nuclear stress test 11/2010 NEG, EF normal  . Diabetic peripheral neuropathy     Painful; 1 vicodin bid very helpful (pt resistant to alternative tx's)  . Early cataracts, bilateral 04/24/12    [name] in [place], [place]  . Herpes zoster     Two separate outbreaks--one on left axillary/back region, one on right axillary/back region  . Chronic renal insufficiency, stage III (moderate) 2013    CrCl est high 50s (borderline stage II/III    Past surgical, social, and family history reviewed and no changes noted since last office visit.  MEDS:  Outpatient Prescriptions Prior to Visit  Medication Sig Dispense Refill  . aspirin 81 MG EC tablet Take 81 mg by mouth daily.        [name] glipiZIDE (GLUCOTROL XL) 10 MG 24 hr tablet Take 1 tablet (10 mg total) by mouth daily.  30 tablet  0  . HYDROcodone-acetaminophen (VICODIN) 5-500 MG per tablet Take 1 tablet by mouth 2 (two) times daily.  60 tablet  3  . insulin detemir (LEVEMIR FLEXPEN) 100 UNIT/ML injection Inject 14 Units into the skin at bedtime.      . Insulin Pen Needle 32G X 4 MM MISC Use as direct to inject insulin. DX 250.00  100 each  6  . lisinopril (PRINIVIL,ZESTRIL) 2.5 MG tablet Take 1 tablet (2.5 mg total) by mouth daily.  90 tablet  1    . lovastatin (MEVACOR) 40 MG tablet Take 1 tablet (40 mg total) by mouth at bedtime.  90 tablet  1  . nitroGLYCERIN (NITROSTAT) 0.4 MG SL tablet Place 1 tablet (0.4 mg total) under the tongue every 5 (five) minutes as needed.  20 tablet  1  . PRODIGY AUTOCODE TEST test strip Use as instructed to check blood sugar.  DX 250.00  100 each  3  . PRODIGY LANCETS 28G MISC Use as directed to check blood sugar. DX 250.00  100 each  3  . [DISCONTINUED] insulin lispro (HUMALOG) 100 UNIT/ML injection 5 units SQ at each mealtime  6 mL  0   Last reviewed on 12/28/2012  8:19 AM by Jeoffrey Massed, MD  PE: Blood pressure 126/74, pulse 69, height 5\' 6"  (1.676 m), weight 131 lb (59.421 kg). Gen: Alert, well appearing.  Patient is oriented to person, place, time, and situation. AFFECT: pleasant, lucid thought and speech. No further exam today.  IMPRESSION AND PLAN:  Type II or unspecified type diabetes mellitus without mention of complication, uncontrolled Control still poor.  Noncompliant with diet/exercise.   Encouraged him to self manage/titrate slowly to get better control slowly and avoid hypoglycemia.  Increase levemir  to 16 units qhs. Increase humalog to 10 Units at BF, 12 Units at Lunch/dinner, and 14 units at supper.  Goal fasting glucose is 100-110 consistently.  Goal glucose level 2 hours after eating is 140-160.    An After Visit Summary was printed and given to the patient.   FOLLOW UP: 2 mo

## 2013-01-04 ENCOUNTER — Telehealth: Payer: Self-pay | Admitting: *Deleted

## 2013-01-04 MED ORDER — INSULIN LISPRO 100 UNIT/ML ~~LOC~~ SOLN: SUBCUTANEOUS | Status: DC

## 2013-01-04 NOTE — Telephone Encounter (Signed)
Pt is unable to afford meds.  Samples offered.   levemir sample given to patient.  No humalog samples in office.  New RX printed for pt to use free sample coupon at pharmacy.  Advised pt I would try to help him with samples and we could also look at patient assistance programs and discuss at next weeks appt.

## 2013-01-10 ENCOUNTER — Other Ambulatory Visit: Payer: Self-pay | Admitting: Family Medicine

## 2013-01-11 NOTE — Telephone Encounter (Signed)
eScribe request for refill on LANCETS Last filled - 05/02/12, #100 X 3 Last seen on - 12/28/12 Follow up - 01/28/13 RX sent

## 2013-01-28 ENCOUNTER — Ambulatory Visit (INDEPENDENT_AMBULATORY_CARE_PROVIDER_SITE_OTHER): Payer: Medicare Other | Admitting: Family Medicine

## 2013-01-28 ENCOUNTER — Encounter: Payer: Self-pay | Admitting: Family Medicine

## 2013-01-28 VITALS — BP 122/84 | HR 73 | Ht 66.0 in | Wt 132.0 lb

## 2013-01-28 DIAGNOSIS — IMO0001 Reserved for inherently not codable concepts without codable children: Secondary | ICD-10-CM

## 2013-01-28 NOTE — Assessment & Plan Note (Signed)
Still some erratic control. He is working on Chief Executive Officer out what foods drive his sugar up, figuring out how many units of insulin at mealtime make his sugar go too low, etc.  He can definitely increase his supper time insulin since his sugars are consistently 300s avg 2H after this meal.  Encouraged a snack at mealtime only, not a full meal as he seems to be doing a good amount of the time.

## 2013-01-28 NOTE — Progress Notes (Signed)
OFFICE NOTE  01/28/2013  CC:  Chief Complaint  Patient presents with  . Follow-up    DM-numbers up and down; HTN     HPI: Patient is a 67 y.o. Caucasian male who is here for 1 month f/u DM 2--he has been titrating levemir and humalog.   He is at 16 U levemir qhs, 09-15-13 U humalong w/BF, lunch, supper respectively. Compliant with other meds.   "Can't do a lot of exercise on account of my hip".  Right hip pain with standing/walking.  He says he doesn't want to look into this right now b/c of financial concerns.  He takes no med for this.   Pertinent PMH:  Past Medical History  Diagnosis Date  . Hyperlipidemia   . COPD (chronic obstructive pulmonary disease)   . Diabetes mellitus   . Coronary atherosclerosis of native coronary vessel     S/P NSTEMI 04/11/2009--DEL stent to LAD.  Nuclear stress test 11/2010 NEG, EF normal  . Diabetic peripheral neuropathy     Painful; 1 vicodin bid very helpful (pt resistant to alternative tx's)  . Early cataracts, bilateral 04/24/12    [name] in [place], [place]  . Herpes zoster     Two separate outbreaks--one on left axillary/back region, one on right axillary/back region  . Chronic renal insufficiency, stage III (moderate) 2013    CrCl est high 50s (borderline stage II/III    Past Surgical History  Procedure Laterality Date  . Spine surgery      L-Spine 2000, C-Spine 2004  . Cryotherapy to ak lesion on nose  2/11  . Colonoscopy  12/2010    Normal screening colonoscopy; rpt 10 yrs  . Eye exam,diabetic  02/2011    Normal diabetic retinopathy screening exam at [facility] in [place], [place].  . Cardiovascular stress test  11/2010    No ischemia/normal EF  . Coronary stent placement  04/2009    DES to LAD    MEDS:  Outpatient Prescriptions Prior to Visit  Medication Sig Dispense Refill  . aspirin 81 MG EC tablet Take 81 mg by mouth daily.        [name] glipiZIDE (GLUCOTROL XL) 10 MG 24 hr tablet Take 1 tablet (10 mg total) by mouth daily.  30  tablet  0  . HYDROcodone-acetaminophen (VICODIN) 5-500 MG per tablet Take 1 tablet by mouth 2 (two) times daily.  60 tablet  3  . insulin lispro (HUMALOG KWIKPEN) 100 UNIT/ML injection 10 Units at BF SQ, 12 Units at Lunch SQ, and 14 Units at supper SQ--increase these doses slowly to get 2 hour postprandial glucoses into 140-160 range consistently,  15 mL  12  . Insulin Pen Needle 32G X 4 MM MISC Use as direct to inject insulin. DX 250.00  100 each  6  . lisinopril (PRINIVIL,ZESTRIL) 2.5 MG tablet Take 1 tablet (2.5 mg total) by mouth daily.  90 tablet  1  . lovastatin (MEVACOR) 40 MG tablet Take 1 tablet (40 mg total) by mouth at bedtime.  90 tablet  1  . nitroGLYCERIN (NITROSTAT) 0.4 MG SL tablet Place 1 tablet (0.4 mg total) under the tongue every 5 (five) minutes as needed.  20 tablet  1  . PRODIGY AUTOCODE TEST test strip Use as instructed to check blood sugar.  DX 250.00  100 each  3  . PRODIGY TWIST TOP LANCETS 28G MISC USE AS DIRECTED TO CHECK BLOOD SUGAR  100 each  3  . insulin detemir (  LEVEMIR FLEXPEN) 100 UNIT/ML injection Inject 14 Units into the skin at bedtime.  10 mL  6   No facility-administered medications prior to visit.    PE: Blood pressure 122/84, pulse 73, height 5\' 6"  (1.676 m), weight 132 lb (59.875 kg). Gen: Alert, well appearing.  Patient is oriented to person, place, time, and situation. No further exam today.  IMPRESSION AND PLAN:  Type II or unspecified type diabetes mellitus without mention of complication, uncontrolled Still some erratic control. He is working on Chief Executive Officer out what foods drive his sugar up, figuring out how many units of insulin at mealtime make his sugar go too low, etc.  He can definitely increase his supper time insulin since his sugars are consistently 300s avg 2H after this meal.  Encouraged a snack at mealtime only, not a full meal as he seems to be doing a good amount of the time.   Hip pain, right: he does not want to proceed  with eval of this at this time, nor is he taking any med. He wants to wait at least a year to do this due to financial concerns.  FOLLOW UP: 2 mo

## 2013-02-11 ENCOUNTER — Telehealth: Payer: Self-pay | Admitting: Family Medicine

## 2013-02-11 MED ORDER — GLIPIZIDE ER 10 MG PO TB24: 10.0000 mg | ORAL_TABLET | Freq: Every day | ORAL | Status: DC

## 2013-02-11 NOTE — Telephone Encounter (Signed)
Refill request for GLIPIZIDE XL Last filled- 07/31/12, #30 X 0, GLIPIZIDE (plain) was last filled by our office on 03/14/12, #270 x 1.  Pt was switched to XL on 07/31/12 Last seen - 01/28/13 Follow up - 2 months Refill sent per [facility] refill protocol.

## 2013-02-11 NOTE — Telephone Encounter (Signed)
Patient Information:  Caller Name: Steward Drone  Phone: [phone]  Patient: Derrick [name], Derrick [name]  Gender: Male  DOB: November 19, 1946  Age: 67 Years  PCP: [facility])  Office Follow Up:  Does the office need to follow up with this patient?: Yes  Instructions For The Office: Caller requests Rx refill and declined to contact pharmacy; please inform patient with response. Thank you.  RN Note:  Last order for Glipizide from EMR:    glipiZIDE (GLUCOTROL XL) 10 MG 24 hr tablet [[id number]]   Order Details    Dose: 10 mg Route: Oral Frequency: Daily  Dispense Quantity:  30 tablet Refills:  0 Fills Remaining:  0           Sig: Take 1 tablet (10 mg total) by mouth daily.          Written Date:  07/31/12 Expiration Date:  07/31/13      Start Date:  07/31/12 End Date:  07/31/13      Ordering Provider:  -- Authorizing Provider:  Jeoffrey Massed, MD Ordering User:  Jeoffrey Massed, MD                    Pharmacy:  [id number] - [place], [place] - [address]        Pharmacy Comments:  --           Quantity Remaining:  0 tablet Quantity Filled:  0 tablet  Symptoms  Reason For Call & Symptoms: Caller asks for refill on Glipizide; states he ran out "several days ago"  Reviewed Health History In EMR: Yes  Reviewed Medications In EMR: Yes  Reviewed Allergies In EMR: Yes  Reviewed Surgeries / Procedures: Yes  Date of Onset of Symptoms: Unknown  Guideline(s) Used:  No Protocol Available - Information Only  Disposition Per Guideline:   Discuss with PCP and Callback by Nurse Today  Reason For Disposition Reached:   Nursing judgment  Advice Given:  Call Back If:  New symptoms develop

## 2013-03-25 ENCOUNTER — Telehealth: Payer: Self-pay | Admitting: *Deleted

## 2013-03-25 NOTE — Telephone Encounter (Signed)
Attempt to reach pharmacy; line busy/SLS

## 2013-03-25 NOTE — Telephone Encounter (Signed)
Fax request from pharmacy stating Vicodon 5-500 mg no longer manufactured; can they change [5-325 mg], patient has [3] refills left/SLS Please advise.

## 2013-03-25 NOTE — Telephone Encounter (Signed)
OK to change to 5/325 Vicodin.-thx

## 2013-03-26 NOTE — Telephone Encounter (Signed)
Pharmacy informed/SLS

## 2013-03-28 ENCOUNTER — Encounter: Payer: Self-pay | Admitting: Family Medicine

## 2013-03-28 ENCOUNTER — Ambulatory Visit (INDEPENDENT_AMBULATORY_CARE_PROVIDER_SITE_OTHER): Payer: Medicare Other | Admitting: Family Medicine

## 2013-03-28 VITALS — BP 110/70 | HR 71 | Temp 98.0°F | Ht 66.0 in | Wt 137.5 lb

## 2013-03-28 DIAGNOSIS — L989 Disorder of the skin and subcutaneous tissue, unspecified: Secondary | ICD-10-CM

## 2013-03-28 DIAGNOSIS — E119 Type 2 diabetes mellitus without complications: Secondary | ICD-10-CM

## 2013-03-28 DIAGNOSIS — N189 Chronic kidney disease, unspecified: Secondary | ICD-10-CM

## 2013-03-28 LAB — BASIC METABOLIC PANEL
Chloride: 100 mEq/L (ref 96–112)
Potassium: 5.1 mEq/L (ref 3.5–5.1)
Sodium: 136 mEq/L (ref 135–145)

## 2013-03-28 LAB — HEMOGLOBIN A1C: Hgb A1c MFr Bld: 8.4 % — ABNORMAL HIGH (ref 4.6–6.5)

## 2013-03-28 MED ORDER — HYDROCODONE-ACETAMINOPHEN 5-325 MG PO TABS: 1.0000 | ORAL_TABLET | Freq: Four times a day (QID) | ORAL | Status: DC | PRN

## 2013-04-01 NOTE — Progress Notes (Signed)
OFFICE NOTE  04/01/2013  CC:  Chief Complaint  Patient presents with  . Follow-up    DM2     HPI: Patient is a 67 y.o. Caucasian male who is here for routine f/u DM with LE peripheral neuropathic pain. His pain (chronic burning/irritating pain remains well controlled with small daily doses of vicodin. Glucoses: avg fasting 140, 2H PP BF and lunch are ok, hs/after supper avg 200 or so.  Pt taking 10-12 U humalong BF, 10-12 lunch, and 12 at supper. Levemir dose 16 currently.  No hypoglycemia.  URI sx's lately: 2d, initially ST--gone now.  Nasal congestion/sneezing, mild cough, no fever.[name]  SKIN: Bridge of nose with crusty/flaky lesion    Pertinent PMH:  Past Medical History  Diagnosis Date  . Hyperlipidemia   . COPD (chronic obstructive pulmonary disease)   . Diabetes mellitus   . Coronary atherosclerosis of native coronary vessel     S/P NSTEMI 04/11/2009--DEL stent to LAD.  Nuclear stress test 11/2010 NEG, EF normal  . Diabetic peripheral neuropathy     Painful; 1 vicodin bid very helpful (pt resistant to alternative tx's)  . Early cataracts, bilateral 04/24/12    [name] in [place], [place]  . Herpes zoster     Two separate outbreaks--one on left axillary/back region, one on right axillary/back region  . Chronic renal insufficiency, stage III (moderate) 2013    CrCl est high 50s (borderline stage II/III     MEDS:  Outpatient Prescriptions Prior to Visit  Medication Sig Dispense Refill  . aspirin 81 MG EC tablet Take 81 mg by mouth daily.        [name] glipiZIDE (GLUCOTROL XL) 10 MG 24 hr tablet Take 1 tablet (10 mg total) by mouth daily.  30 tablet  5  . HYDROcodone-acetaminophen (VICODIN) 5-500 MG per tablet Take 1 tablet by mouth 2 (two) times daily.  60 tablet  3  . insulin detemir (LEVEMIR) 100 UNIT/ML injection Inject 16 Units into the skin at bedtime.      . insulin lispro (HUMALOG KWIKPEN) 100 UNIT/ML injection 10 Units at BF SQ, 12 Units at Lunch SQ, and 14 Units  at supper SQ--increase these doses slowly to get 2 hour postprandial glucoses into 140-160 range consistently,  15 mL  12  . Insulin Pen Needle 32G X 4 MM MISC Use as direct to inject insulin. DX 250.00  100 each  6  . lisinopril (PRINIVIL,ZESTRIL) 2.5 MG tablet Take 1 tablet (2.5 mg total) by mouth daily.  90 tablet  1  . lovastatin (MEVACOR) 40 MG tablet Take 1 tablet (40 mg total) by mouth at bedtime.  90 tablet  1  . nitroGLYCERIN (NITROSTAT) 0.4 MG SL tablet Place 1 tablet (0.4 mg total) under the tongue every 5 (five) minutes as needed.  20 tablet  1  . PRODIGY AUTOCODE TEST test strip Use as instructed to check blood sugar.  DX 250.00  100 each  3  . PRODIGY TWIST TOP LANCETS 28G MISC USE AS DIRECTED TO CHECK BLOOD SUGAR  100 each  3   No facility-administered medications prior to visit.    PE: Blood pressure 110/70, pulse 71, temperature 98 F (36.7 C), temperature source Oral, height 5\' 6"  (1.676 m), weight 137 lb 8 oz (62.37 kg), SpO2 95.00%. VS: noted--normal. Gen: alert, NAD, NONTOXIC APPEARING. HEENT: eyes without injection, drainage, or swelling.  Ears: EACs clear, TMs with normal light reflex and landmarks.  Nose: Clear rhinorrhea, with some dried,  crusty exudate adherent to mildly injected mucosa.  No purulent d/c.  No paranasal sinus TTP.  No facial swelling.  Throat and mouth without focal lesion.  No pharyngial swelling, erythema, or exudate.   Neck: supple, no LAD.   LUNGS: CTA bilat, nonlabored resps.   CV: RRR, no m/r/g. EXT: no c/c/e SKIN: no rash FEET: no rash, calluses, or skin breakdown.  DP/PT pulses 2+ bilat.  Monofilament testing shows normal sensation in all regions tested bilat.  IMPRESSION AND PLAN:  1) DM 2, insulin-requiring: Continue to titrate insulins--he is not being very aggressive at all.  Increase. levemir to 16 units, gradually increase supper time humalog.  Discussed goal fasting and 2h postprandial glucose ranges. Foot exam normal today.  Due  for HbA1c today.  BMET today.  2) Viral URI.  Self-limited nature of this illness was discussed, questions answered.  Discussed symptomatic care, rest, fluids.   Warning signs/symptoms of worsening illness were discussed.  Patient instructed to call or return if any of these occur.  3) Lesion on brigde of nose: actinic keratosis vs SCC.  Refer to dermatologist for further eval/mgmt (Dr. Terri Piedra referral ordered today).  FOLLOW UP: 24mo for 30 min appt/CPE   '

## 2013-04-10 ENCOUNTER — Encounter: Payer: Self-pay | Admitting: *Deleted

## 2013-05-05 ENCOUNTER — Other Ambulatory Visit: Payer: Self-pay | Admitting: Family Medicine

## 2013-05-06 NOTE — Telephone Encounter (Signed)
Rx request to pharmacy/SLS  

## 2013-05-17 ENCOUNTER — Other Ambulatory Visit: Payer: Self-pay | Admitting: *Deleted

## 2013-05-17 DIAGNOSIS — E119 Type 2 diabetes mellitus without complications: Secondary | ICD-10-CM

## 2013-05-17 MED ORDER — LISINOPRIL 2.5 MG PO TABS: 2.5000 mg | ORAL_TABLET | Freq: Every day | ORAL | Status: DC

## 2013-05-17 NOTE — Telephone Encounter (Signed)
Rx request to pharmacy/SLS  

## 2013-06-13 ENCOUNTER — Other Ambulatory Visit: Payer: Self-pay

## 2013-07-11 ENCOUNTER — Telehealth: Payer: Self-pay | Admitting: Family Medicine

## 2013-07-11 NOTE — Telephone Encounter (Signed)
Patient advised samples available. CPE scheduled for 08/02/13.

## 2013-07-11 NOTE — Telephone Encounter (Signed)
I will give patient two samples.  Diane to make a CPE appt for patient.

## 2013-07-29 ENCOUNTER — Encounter: Payer: Medicare Other | Admitting: Family Medicine

## 2013-08-02 ENCOUNTER — Encounter: Payer: Self-pay | Admitting: Family Medicine

## 2013-08-02 ENCOUNTER — Ambulatory Visit (INDEPENDENT_AMBULATORY_CARE_PROVIDER_SITE_OTHER): Payer: Medicare Other | Admitting: Family Medicine

## 2013-08-02 VITALS — BP 112/70 | HR 60 | Temp 98.4°F | Resp 16 | Ht 66.0 in | Wt 131.0 lb

## 2013-08-02 DIAGNOSIS — R011 Cardiac murmur, unspecified: Secondary | ICD-10-CM

## 2013-08-02 DIAGNOSIS — Z125 Encounter for screening for malignant neoplasm of prostate: Secondary | ICD-10-CM

## 2013-08-02 DIAGNOSIS — Z0000 Encounter for general adult medical examination without abnormal findings: Secondary | ICD-10-CM

## 2013-08-02 DIAGNOSIS — E119 Type 2 diabetes mellitus without complications: Secondary | ICD-10-CM

## 2013-08-02 LAB — CBC WITH DIFFERENTIAL/PLATELET
Basophils Relative: 0.8 % (ref 0.0–3.0)
Eosinophils Relative: 2.3 % (ref 0.0–5.0)
HCT: 37.5 % — ABNORMAL LOW (ref 39.0–52.0)
Lymphs Abs: 1.4 10*3/uL (ref 0.7–4.0)
MCV: 86 fl (ref 78.0–100.0)
Monocytes Absolute: 0.5 10*3/uL (ref 0.1–1.0)
Neutrophils Relative %: 64.4 % (ref 43.0–77.0)
RBC: 4.37 Mil/uL (ref 4.22–5.81)
WBC: 5.7 10*3/uL (ref 4.5–10.5)

## 2013-08-02 LAB — COMPREHENSIVE METABOLIC PANEL
ALT: 21 U/L (ref 0–53)
Albumin: 4.3 g/dL (ref 3.5–5.2)
CO2: 27 mEq/L (ref 19–32)
Calcium: 9.5 mg/dL (ref 8.4–10.5)
Chloride: 103 mEq/L (ref 96–112)
GFR: 49.88 mL/min — ABNORMAL LOW (ref >60.00)
Potassium: 4.6 mEq/L (ref 3.5–5.1)
Sodium: 139 mEq/L (ref 135–145)
Total Bilirubin: 0.9 mg/dL (ref 0.3–1.2)
Total Protein: 6.9 g/dL (ref 6.0–8.3)

## 2013-08-02 LAB — LIPID PANEL: Total CHOL/HDL Ratio: 3

## 2013-08-02 NOTE — Progress Notes (Signed)
Office Note 08/16/2013  CC:  Chief Complaint  Patient presents with  . Annual Exam    HPI:  Derrick [name] is a 67 y.o. White male who is here for CPE.  He denies any acute complaints.  He is fasting today.   Past Medical History  Diagnosis Date  . Hyperlipidemia   . COPD (chronic obstructive pulmonary disease)   . Diabetes mellitus   . Coronary atherosclerosis of native coronary vessel     S/P NSTEMI 04/11/2009--DEL stent to LAD.  Nuclear stress test 11/2010 NEG, EF normal  . Diabetic peripheral neuropathy     Painful; 1 vicodin bid very helpful (pt resistant to alternative tx's)  . Early cataracts, bilateral 04/24/12    [name] in [place], [place]  . Herpes zoster     Two separate outbreaks--one on left axillary/back region, one on right axillary/back region  . Chronic renal insufficiency, stage III (moderate) 2013    CrCl est high 50s (borderline stage II/III     Past Surgical History  Procedure Laterality Date  . Spine surgery      L-Spine 2000, C-Spine 2004  . Cryotherapy to ak lesion on nose  2/11  . Colonoscopy  12/2010    Normal screening colonoscopy; rpt 10 yrs  . Eye exam,diabetic  02/2011    Normal diabetic retinopathy screening exam at [facility] in [place], [place].  . Cardiovascular stress test  11/2010    No ischemia/normal EF  . Coronary stent placement  04/2009    DES to LAD    Family History  Problem Relation Age of Onset  . Coronary artery disease      family hx of  . Cancer Mother     brain  . Diabetes Father   . Heart disease Father   . Diabetes Brother   . Heart disease Brother   . Diabetes Son   . Hyperlipidemia Son     History   Social History  . Marital Status: Married    Spouse Name: N/A    Number of Children: N/A  . Years of Education: N/A   Occupational History  . Not on file.   Social History Main Topics  . Smoking status: Former Smoker    Types: Cigarettes    Quit date: 01/06/2011  . Smokeless tobacco: Never Used   . Alcohol Use: No  . Drug Use: No  . Sexual Activity: No   Other Topics Concern  . Not on file   Social History Narrative   Married, works part time at [employer] in [place], [place].   Former smoker: quit 01/2011.  No alcohol or drugs.   No formal exercise.    Outpatient Prescriptions Prior to Visit  Medication Sig Dispense Refill  . aspirin 81 MG EC tablet Take 81 mg by mouth daily.        [name] glucose blood (PRODIGY TEST) test strip USE AS DIRECTED TO TEST BLOOD GLUCOSE DX: 250.02  100 each  5  . HYDROcodone-acetaminophen (NORCO/VICODIN) 5-325 MG per tablet Take 1 tablet by mouth every 6 (six) hours as needed for pain.  60 tablet  3  . insulin detemir (LEVEMIR) 100 UNIT/ML injection Inject 16 Units into the skin at bedtime.      . Insulin Pen Needle 32G X 4 MM MISC Use as direct to inject insulin. DX 250.00  100 each  6  . lisinopril (PRINIVIL,ZESTRIL) 2.5 MG tablet Take 1 tablet (2.5 mg total) by mouth  daily.  90 tablet  1  . nitroGLYCERIN (NITROSTAT) 0.4 MG SL tablet Place 1 tablet (0.4 mg total) under the tongue every 5 (five) minutes as needed.  20 tablet  1  . PRODIGY TWIST TOP LANCETS 28G MISC USE AS DIRECTED TO TEST BLOOD GLUCOSE DX: 250.02  100 each  5  . glipiZIDE (GLUCOTROL XL) 10 MG 24 hr tablet Take 1 tablet (10 mg total) by mouth daily.  30 tablet  5  . lovastatin (MEVACOR) 40 MG tablet Take 1 tablet (40 mg total) by mouth at bedtime.  90 tablet  1  . HYDROcodone-acetaminophen (VICODIN) 5-500 MG per tablet Take 1 tablet by mouth 2 (two) times daily.  60 tablet  3  . insulin lispro (HUMALOG KWIKPEN) 100 UNIT/ML injection 10 Units at BF SQ, 12 Units at Lunch SQ, and 14 Units at supper SQ--increase these doses slowly to get 2 hour postprandial glucoses into 140-160 range consistently,  15 mL  12   No facility-administered medications prior to visit.    Allergies  Allergen Reactions  . Simvastatin Other (See Comments)    Muscle pain    ROS Review of Systems   Constitutional: Negative for fever, chills, appetite change and fatigue.  HENT: Negative for ear pain, congestion, sore throat, neck stiffness and dental problem.   Eyes: Negative for discharge, redness and visual disturbance.  Respiratory: Negative for cough, chest tightness, shortness of breath and wheezing.   Cardiovascular: Negative for chest pain, palpitations and leg swelling.  Gastrointestinal: Negative for nausea, vomiting, abdominal pain, diarrhea and blood in stool.  Genitourinary: Negative for dysuria, urgency, frequency, hematuria, flank pain and difficulty urinating.  Musculoskeletal: Positive for myalgias (pain in both calves ONLY when standing or walking on concrete floors). Negative for back pain, joint swelling and arthralgias.  Skin: Negative for pallor and rash.  Neurological: Negative for dizziness, speech difficulty, weakness and headaches.  Hematological: Negative for adenopathy. Does not bruise/bleed easily.  Psychiatric/Behavioral: Negative for confusion and sleep disturbance. The patient is not nervous/anxious.      PE; Blood pressure 112/70, pulse 60, temperature 98.4 F (36.9 C), temperature source Temporal, resp. rate 16, height 5\' 6"  (1.676 m), weight 131 lb (59.421 kg), SpO2 97.00%. Gen: Alert, well appearing.  Patient is oriented to person, place, time, and situation. AFFECT: pleasant, lucid thought and speech. ENT: Ears: EACs clear, normal epithelium.  TMs with good light reflex and landmarks bilaterally.  Eyes: no injection, icteris, swelling, or exudate.  EOMI, PERRLA. Nose: no drainage or turbinate edema/swelling.  No injection or focal lesion.  Mouth: lips without lesion/swelling.  Oral mucosa pink and moist.  Dentition intact and without obvious caries or gingival swelling.  Oropharynx without erythema, exudate, or swelling.  Neck: supple/nontender.  No LAD, mass, or TM.  Carotid pulses 2+ bilaterally, without bruits. CV: RRR,  1-2/6 systolic murmur, high  pitched "squeaky" quality heard best at L & R USB.  No r/g.   LUNGS: CTA bilat, nonlabored resps, good aeration in all lung fields. ABD: soft, NT, ND, BS normal.  No hepatospenomegaly or mass.  Abdominal aortic pulsations are palpable.  No bruits. EXT: no clubbing, cyanosis, or edema.  DP and PT pulses 1+  bilat. Musculoskeletal: no joint swelling, erythema, warmth, or tenderness.  ROM of all joints intact. Skin - no sores or suspicious lesions or rashes or color changes Neuro: CN 2-12 intact bilaterally, strength 5/5 in proximal and distal upper extremities and lower extremities bilaterally.   No tremor.  No disdiadochokinesis.  No ataxia.  Upper extremity and lower extremity DTRs symmetric.  No pronator drift. Rectal exam: negative without mass, lesions or tenderness, PROSTATE EXAM: smooth and symmetric without nodules or tenderness.   Pertinent labs:  None today  ASSESSMENT AND PLAN:   Health maintenance examination Reviewed age and gender appropriate health maintenance issues (prudent diet, regular exercise, health risks of tobacco and excessive alcohol, use of seatbelts, fire alarms in home, use of sunscreen).  Also reviewed age and gender appropriate health screening as well as vaccine recommendations. Pt declined flu vaccine today. Check CBC, CMET, TSH, and FLP today. DRE normal.  PSA done today. Colon cancer screening UTD--repeat colonoscopy 2022.   Systolic murmur New. Obtain echocardiogram.  Type II or unspecified type diabetes mellitus without mention of complication, uncontrolled HbA1c today. Reminded pt that he is overdue for annual diab retpthy screening.  An After Visit Summary was printed and given to the patient.  FOLLOW UP:  Return in about 4 months (around 12/02/2013) for f/u DM 2.

## 2013-08-05 DIAGNOSIS — I340 Nonrheumatic mitral (valve) insufficiency: Secondary | ICD-10-CM

## 2013-08-05 HISTORY — PX: TRANSTHORACIC ECHOCARDIOGRAM: SHX275

## 2013-08-05 HISTORY — DX: Nonrheumatic mitral (valve) insufficiency: I34.0

## 2013-08-07 ENCOUNTER — Other Ambulatory Visit: Payer: Self-pay | Admitting: Family Medicine

## 2013-08-07 DIAGNOSIS — D649 Anemia, unspecified: Secondary | ICD-10-CM

## 2013-08-09 ENCOUNTER — Other Ambulatory Visit: Payer: Self-pay | Admitting: Family Medicine

## 2013-08-09 DIAGNOSIS — E119 Type 2 diabetes mellitus without complications: Secondary | ICD-10-CM

## 2013-08-09 MED ORDER — LOVASTATIN 40 MG PO TABS: 40.0000 mg | ORAL_TABLET | Freq: Every day | ORAL | Status: DC

## 2013-08-15 ENCOUNTER — Ambulatory Visit (HOSPITAL_COMMUNITY): Payer: Medicare Other | Attending: Cardiology

## 2013-08-15 DIAGNOSIS — Z87891 Personal history of nicotine dependence: Secondary | ICD-10-CM | POA: Insufficient documentation

## 2013-08-15 DIAGNOSIS — N289 Disorder of kidney and ureter, unspecified: Secondary | ICD-10-CM | POA: Insufficient documentation

## 2013-08-15 DIAGNOSIS — E119 Type 2 diabetes mellitus without complications: Secondary | ICD-10-CM | POA: Insufficient documentation

## 2013-08-15 DIAGNOSIS — J4489 Other specified chronic obstructive pulmonary disease: Secondary | ICD-10-CM | POA: Insufficient documentation

## 2013-08-15 DIAGNOSIS — E785 Hyperlipidemia, unspecified: Secondary | ICD-10-CM | POA: Insufficient documentation

## 2013-08-15 DIAGNOSIS — R011 Cardiac murmur, unspecified: Secondary | ICD-10-CM | POA: Insufficient documentation

## 2013-08-15 DIAGNOSIS — J449 Chronic obstructive pulmonary disease, unspecified: Secondary | ICD-10-CM | POA: Insufficient documentation

## 2013-08-15 DIAGNOSIS — G609 Hereditary and idiopathic neuropathy, unspecified: Secondary | ICD-10-CM | POA: Insufficient documentation

## 2013-08-15 NOTE — Progress Notes (Signed)
Echocardiogram performed.  

## 2013-08-16 DIAGNOSIS — R011 Cardiac murmur, unspecified: Secondary | ICD-10-CM | POA: Insufficient documentation

## 2013-08-16 DIAGNOSIS — Z0000 Encounter for general adult medical examination without abnormal findings: Secondary | ICD-10-CM | POA: Insufficient documentation

## 2013-08-16 NOTE — Assessment & Plan Note (Signed)
New. Obtain echocardiogram.

## 2013-08-16 NOTE — Assessment & Plan Note (Addendum)
Reviewed age and gender appropriate health maintenance issues (prudent diet, regular exercise, health risks of tobacco and excessive alcohol, use of seatbelts, fire alarms in home, use of sunscreen).  Also reviewed age and gender appropriate health screening as well as vaccine recommendations. Pt declined flu vaccine today. Check CBC, CMET, TSH, and FLP today. DRE normal.  PSA done today. Colon cancer screening UTD--repeat colonoscopy 2022.

## 2013-08-16 NOTE — Assessment & Plan Note (Signed)
HbA1c today. Reminded pt that he is overdue for annual diab retpthy screening.

## 2013-08-26 ENCOUNTER — Other Ambulatory Visit: Payer: Medicare Other

## 2013-09-16 ENCOUNTER — Telehealth: Payer: Self-pay | Admitting: Family Medicine

## 2013-09-16 NOTE — Telephone Encounter (Signed)
Notified patient that we have levemir samples only.

## 2013-09-16 NOTE — Telephone Encounter (Signed)
Caller: Brenda/Spouse; Phone: [phone]; Reason for Call: Returning call to Lisa/office regarding free samples of diabetes syringes for both Humalog and Levamir.  Has one full dose of both remaining for 09/16/13. Please call back.

## 2013-10-09 ENCOUNTER — Telehealth: Payer: Self-pay | Admitting: Family Medicine

## 2013-10-09 MED ORDER — HYDROCODONE-ACETAMINOPHEN 5-325 MG PO TABS: 1.0000 | ORAL_TABLET | Freq: Four times a day (QID) | ORAL | Status: DC | PRN

## 2013-10-09 NOTE — Telephone Encounter (Signed)
Patient last seen 08/02/13, next OV scheduled 12/03/13.  Rx last printed 03/28/13 x 3 refills.  Please advise hydrocodone refill.

## 2013-10-09 NOTE — Telephone Encounter (Signed)
3 separate hydrocodone rx's printed since RFs cannot be put on these rx's anymore. Pls make sure pt is aware that these rx's are his responsibility and cannot be replaced if lost.  He may want to see if his pharmacy will hold them for him.-thx

## 2013-10-09 NOTE — Telephone Encounter (Signed)
Patient aware that rx's are ready for pick up and that they are his responsibility to keep track of and that we can not replace them.  I also told patient he could get one levemir sample.

## 2013-10-09 NOTE — Telephone Encounter (Signed)
Patient needs Rx for his pain pills (he didn't know the name). If there are any Levemir pen samples available can he get 2? Please contact patient when ready to pick up. He can get calls at work.

## 2013-10-11 ENCOUNTER — Telehealth: Payer: Self-pay | Admitting: Family Medicine

## 2013-10-11 MED ORDER — OMEPRAZOLE 20 MG PO CPDR: DELAYED_RELEASE_CAPSULE | ORAL | Status: DC

## 2013-10-11 NOTE — Telephone Encounter (Signed)
Patient's wife requesting prilosec to be sent to the pharmacy.  He has been taking it OTC but it's expensive to do it that way.  Please advise.

## 2013-10-11 NOTE — Telephone Encounter (Signed)
Omeprazole 20mg  rx sent.

## 2013-11-18 ENCOUNTER — Other Ambulatory Visit: Payer: Self-pay | Admitting: Family Medicine

## 2013-11-18 DIAGNOSIS — E119 Type 2 diabetes mellitus without complications: Secondary | ICD-10-CM

## 2013-11-18 MED ORDER — LISINOPRIL 2.5 MG PO TABS: 2.5000 mg | ORAL_TABLET | Freq: Every day | ORAL | Status: DC

## 2013-11-20 ENCOUNTER — Other Ambulatory Visit: Payer: Self-pay | Admitting: Family Medicine

## 2013-11-20 ENCOUNTER — Telehealth: Payer: Self-pay | Admitting: Family Medicine

## 2013-11-20 MED ORDER — GLUCOSE BLOOD VI STRP: ORAL_STRIP | Status: DC

## 2013-11-20 NOTE — Telephone Encounter (Signed)
Sent test strips to pharmacy.  We currently do not have the insulin samples that he needs.  Sorry.

## 2013-11-20 NOTE — Telephone Encounter (Signed)
Please call patient. He wants to know if we have any Levemir or Humolog pen samples.

## 2013-11-20 NOTE — Telephone Encounter (Signed)
Patient's VM has not been set up. I couldn't leave a message.

## 2013-12-03 ENCOUNTER — Encounter: Payer: Self-pay | Admitting: Family Medicine

## 2013-12-03 ENCOUNTER — Ambulatory Visit (INDEPENDENT_AMBULATORY_CARE_PROVIDER_SITE_OTHER): Payer: Medicare Other | Admitting: Family Medicine

## 2013-12-03 VITALS — BP 150/84 | HR 64 | Temp 97.8°F | Resp 16 | Ht 66.0 in | Wt 129.0 lb

## 2013-12-03 DIAGNOSIS — E1159 Type 2 diabetes mellitus with other circulatory complications: Secondary | ICD-10-CM

## 2013-12-03 DIAGNOSIS — E1169 Type 2 diabetes mellitus with other specified complication: Secondary | ICD-10-CM

## 2013-12-03 DIAGNOSIS — R252 Cramp and spasm: Secondary | ICD-10-CM

## 2013-12-03 DIAGNOSIS — D649 Anemia, unspecified: Secondary | ICD-10-CM

## 2013-12-03 DIAGNOSIS — I10 Essential (primary) hypertension: Secondary | ICD-10-CM

## 2013-12-03 DIAGNOSIS — R011 Cardiac murmur, unspecified: Secondary | ICD-10-CM

## 2013-12-03 DIAGNOSIS — IMO0001 Reserved for inherently not codable concepts without codable children: Secondary | ICD-10-CM

## 2013-12-03 LAB — CBC WITH DIFFERENTIAL/PLATELET
Basophils Absolute: 0 10*3/uL (ref 0.0–0.1)
Hemoglobin: 13.8 g/dL (ref 13.0–17.0)
Lymphocytes Relative: 22.9 % (ref 12.0–46.0)
Monocytes Relative: 9.5 % (ref 3.0–12.0)
Neutro Abs: 4 10*3/uL (ref 1.4–7.7)
RBC: 4.78 Mil/uL (ref 4.22–5.81)
RDW: 14 % (ref 11.5–14.6)

## 2013-12-03 LAB — MICROALBUMIN / CREATININE URINE RATIO
Creatinine,U: 97.1 mg/dL
Microalb Creat Ratio: 0.9 mg/g (ref 0.0–30.0)
Microalb, Ur: 0.9 mg/dL (ref 0.0–1.9)

## 2013-12-03 LAB — BASIC METABOLIC PANEL
Calcium: 9.3 mg/dL (ref 8.4–10.5)
Chloride: 101 mEq/L (ref 96–112)
Creatinine, Ser: 1.3 mg/dL (ref 0.4–1.5)
GFR: 59.92 mL/min — ABNORMAL LOW (ref >60.00)

## 2013-12-03 LAB — MAGNESIUM: Magnesium: 2 mg/dL (ref 1.5–2.5)

## 2013-12-03 NOTE — Progress Notes (Signed)
Pre visit review using our clinic review tool, if applicable. No additional management support is needed unless otherwise documented below in the visit note. OFFICE NOTE  12/03/2013  CC:  Chief Complaint  Patient presents with  . Follow-up     HPI: Patient is a 67 y.o. Caucasian male who is here for 4 mo f/u DM 2. Reports sugars "up and down".  Highest about 400.  Avg 160.  No hypoglycemia. He is taking his humalog sometimes hs WITH his levemir and we discussed this today, I reminded him this is not a good thing to do.  He feels like every time he takes his humalog he gets muscle cramps. Has been noncompliant with humalog lately (takes 15 U one meal a day only) due to financial contraints. Taking levemir 16 U qhs.  Pertinent PMH:  Past Medical History  Diagnosis Date  . Hyperlipidemia   . COPD (chronic obstructive pulmonary disease)   . Diabetes mellitus   . Coronary atherosclerosis of native coronary vessel     S/P NSTEMI 04/11/2009--DEL stent to LAD.  Nuclear stress test 11/2010 NEG, EF normal  . Diabetic peripheral neuropathy     Painful; 1 vicodin bid very helpful (pt resistant to alternative tx's)  . Early cataracts, bilateral 04/24/12    [name] in [place], [place]  . Herpes zoster     Two separate outbreaks--one on left axillary/back region, one on right axillary/back region  . Chronic renal insufficiency, stage III (moderate) 2013    CrCl est high 50s (borderline stage II/III    Past surgical, social, and family history reviewed and no changes noted since last office visit.  MEDS:  Outpatient Prescriptions Prior to Visit  Medication Sig Dispense Refill  . aspirin 81 MG EC tablet Take 81 mg by mouth daily.        [name] HYDROcodone-acetaminophen (NORCO/VICODIN) 5-325 MG per tablet Take 1 tablet by mouth every 6 (six) hours as needed.  60 tablet  0  . insulin detemir (LEVEMIR) 100 UNIT/ML injection Inject 16 Units into the skin at bedtime.      . insulin lispro (HUMALOG  KWIKPEN) 100 UNIT/ML injection 10 Units at BF SQ, 12 Units at Lunch SQ, and 14 Units at supper SQ--increase these doses slowly to get 2 hour postprandial glucoses into 140-160 range consistently,  15 mL  12  . lisinopril (PRINIVIL,ZESTRIL) 2.5 MG tablet Take 1 tablet (2.5 mg total) by mouth daily.  90 tablet  1  . lovastatin (MEVACOR) 40 MG tablet Take 1 tablet (40 mg total) by mouth at bedtime.  90 tablet  1  . nitroGLYCERIN (NITROSTAT) 0.4 MG SL tablet Place 1 tablet (0.4 mg total) under the tongue every 5 (five) minutes as needed.  20 tablet  1  . omeprazole (PRILOSEC) 20 MG capsule 1 tab po qAM  30 capsule  6  . glucose blood (PRODIGY TEST) test strip USE AS DIRECTED TO TEST BLOOD GLUCOSE DX: 250.02  100 each  5  . Insulin Pen Needle 32G X 4 MM MISC Use as direct to inject insulin. DX 250.00  100 each  6  . PRODIGY TWIST TOP LANCETS 28G MISC USE AS DIRECTED TO TEST BLOOD GLUCOSE DX: 250.02  100 each  5   No facility-administered medications prior to visit.    PE: Blood pressure 150/84, pulse 64, temperature 97.8 F (36.6 C), temperature source Temporal, resp. rate 16, height 5\' 6"  (1.676 m), weight 129 lb (58.514 kg), SpO2 98.00%. Gen:  Alert, well appearing.  Patient is oriented to person, place, time, and situation. CV: 2/6 systolic murmur heard best at cardiac base.  S1 and S2 normal.  No diastolic murmur. Chest is clear, no wheezing or rales. Normal symmetric air entry throughout both lung fields. No chest wall deformities or tenderness. EXT: no clubbing, cyanosis, or edema.    IMPRESSION AND PLAN:  Type II or unspecified type diabetes mellitus without mention of complication, uncontrolled Encouraged better compliance with humalog--gave 2 samples of this med today. Check HbA1c and urine microalb/cr today. Encouraged pt to contact his ophthalmologist to set up his annual diab screening exam.  Hypertension associated with diabetes BP goal <130/80.  Pt states past home monitoring  has met this goal but he admits to not monitoring any in last few months. BP slightly up today.  Encouraged pt to restart home bp monitoring and call if persistently elevated. No change in meds for this today.  Systolic murmur Recent echo showed moderate mitral regurgitation.  He has no symptoms related to this. Pt did not set up routine f/u with his cardiologist as we had discussed last visit. He says he will do this soon.  Normocytic anemia Pt denies melena. Hb was only mildly low 4 mo ago. Will recheck CBC today + ferritin and vit B12 level.   An After Visit Summary was printed and given to the patient.  FOLLOW UP: 45mo

## 2013-12-03 NOTE — Assessment & Plan Note (Signed)
Recent echo showed moderate mitral regurgitation.  He has no symptoms related to this. Pt did not set up routine f/u with his cardiologist as we had discussed last visit. He says he will do this soon.

## 2013-12-03 NOTE — Assessment & Plan Note (Signed)
BP goal <130/80.  Pt states past home monitoring has met this goal but he admits to not monitoring any in last few months. BP slightly up today.  Encouraged pt to restart home bp monitoring and call if persistently elevated. No change in meds for this today.

## 2013-12-03 NOTE — Assessment & Plan Note (Signed)
Encouraged better compliance with humalog--gave 2 samples of this med today. Check HbA1c and urine microalb/cr today. Encouraged pt to contact his ophthalmologist to set up his annual diab screening exam.

## 2013-12-03 NOTE — Patient Instructions (Signed)
Call your cardiologist's office and arrange routine follow up. Call your eye doctor and make appt for routine diabetic eye exam.

## 2013-12-03 NOTE — Assessment & Plan Note (Signed)
Pt denies melena. Hb was only mildly low 4 mo ago. Will recheck CBC today + ferritin and vit B12 level.

## 2013-12-11 ENCOUNTER — Encounter: Payer: Self-pay | Admitting: Family Medicine

## 2013-12-20 ENCOUNTER — Telehealth: Payer: Self-pay | Admitting: Family Medicine

## 2013-12-20 DIAGNOSIS — E119 Type 2 diabetes mellitus without complications: Secondary | ICD-10-CM

## 2013-12-20 MED ORDER — INSULIN NPH ISOPHANE & REGULAR (70-30) 100 UNIT/ML ~~LOC~~ SUSP: SUBCUTANEOUS | Status: DC

## 2013-12-20 NOTE — Telephone Encounter (Signed)
Patient called back to get his lab results.   Patient states that he can't afford the levemir at this time and he's almost out.  I looked for samples but we currently don't have any of the levemir pens.  Please advise.

## 2013-12-20 NOTE — Telephone Encounter (Signed)
OK.  Stop ALL of his current insulins. I want him to take an insulin called humulin 70/30 mix, take as instructed on the rx that I send to his pharmacy. It will be in a vial, not a pen.  I'll do rx for syringes and needles.  He'll be taking only 2 injections a day: one 30 min before BF and one 30 min before supper.  -thx

## 2013-12-23 NOTE — Telephone Encounter (Signed)
Patient still not able to pick up this insulin because the cost was around $200.  Please advise.

## 2013-12-24 NOTE — Telephone Encounter (Signed)
Pls call pt's pharmacy and ask for cash cost estimate for the following meds for 1 mo supply:  NPH insulin 26 units qBreakfast and 13 units qSupper AND Regular insulin 15 units qBreakfast and 7 units qSupper. --thx

## 2013-12-26 MED ORDER — INSULIN NPH (HUMAN) (ISOPHANE) 100 UNIT/ML ~~LOC~~ SUSP
SUBCUTANEOUS | Status: DC
Start: 2013-12-26 — End: 2014-04-01

## 2013-12-26 NOTE — Telephone Encounter (Signed)
Patient also requesting glucometer to be called in.  Insurance covers one touch ultra mini.  Glucometer sent to pharmacy.

## 2014-01-01 ENCOUNTER — Telehealth: Payer: Self-pay | Admitting: Family Medicine

## 2014-01-01 MED ORDER — ONETOUCH ULTRA SYSTEM W/DEVICE KIT: PACK | Status: AC

## 2014-01-01 NOTE — Telephone Encounter (Signed)
Patients pharmacy states they never received his ultra one touch mini, patient would like for us to send that in again along with test strips and lancets to [facility] in [place].

## 2014-01-01 NOTE — Telephone Encounter (Signed)
Patient's pharmacy did receive e-script but the rx must be a hard copy for insurance to cover.  I faxed rx to pharmacy.

## 2014-01-06 ENCOUNTER — Telehealth: Payer: Self-pay | Admitting: Family Medicine

## 2014-01-06 MED ORDER — PRODIGY TWIST TOP LANCETS 28G MISC: Status: DC

## 2014-01-06 MED ORDER — GLUCOSE BLOOD VI STRP: ORAL_STRIP | Status: DC

## 2014-01-06 NOTE — Telephone Encounter (Signed)
Patient also wants to know if we have any samples of levemir pens

## 2014-01-06 NOTE — Telephone Encounter (Signed)
Lancets and stripes sent to pharmacy.  We do not have any samples at this time.

## 2014-01-07 NOTE — Telephone Encounter (Signed)
Patient advised.

## 2014-02-13 ENCOUNTER — Other Ambulatory Visit: Payer: Self-pay | Admitting: Family Medicine

## 2014-02-13 MED ORDER — INSULIN PEN NEEDLE 32G X 4 MM MISC: Status: DC

## 2014-02-19 ENCOUNTER — Other Ambulatory Visit: Payer: Self-pay | Admitting: Family Medicine

## 2014-02-19 DIAGNOSIS — E1165 Type 2 diabetes mellitus with hyperglycemia: Secondary | ICD-10-CM

## 2014-02-19 DIAGNOSIS — E119 Type 2 diabetes mellitus without complications: Secondary | ICD-10-CM

## 2014-02-19 DIAGNOSIS — IMO0001 Reserved for inherently not codable concepts without codable children: Secondary | ICD-10-CM

## 2014-02-19 MED ORDER — LOVASTATIN 40 MG PO TABS: 40.0000 mg | ORAL_TABLET | Freq: Every day | ORAL | Status: DC

## 2014-04-01 ENCOUNTER — Ambulatory Visit (INDEPENDENT_AMBULATORY_CARE_PROVIDER_SITE_OTHER): Payer: Medicare Other | Admitting: Family Medicine

## 2014-04-01 ENCOUNTER — Encounter: Payer: Self-pay | Admitting: Family Medicine

## 2014-04-01 VITALS — BP 129/84 | HR 59 | Temp 98.0°F | Ht 66.0 in | Wt 130.2 lb

## 2014-04-01 DIAGNOSIS — E1169 Type 2 diabetes mellitus with other specified complication: Secondary | ICD-10-CM

## 2014-04-01 DIAGNOSIS — I10 Essential (primary) hypertension: Secondary | ICD-10-CM

## 2014-04-01 DIAGNOSIS — E785 Hyperlipidemia, unspecified: Secondary | ICD-10-CM

## 2014-04-01 DIAGNOSIS — IMO0001 Reserved for inherently not codable concepts without codable children: Secondary | ICD-10-CM

## 2014-04-01 DIAGNOSIS — E1165 Type 2 diabetes mellitus with hyperglycemia: Principal | ICD-10-CM

## 2014-04-01 DIAGNOSIS — E1159 Type 2 diabetes mellitus with other circulatory complications: Secondary | ICD-10-CM

## 2014-04-01 DIAGNOSIS — I152 Hypertension secondary to endocrine disorders: Secondary | ICD-10-CM

## 2014-04-01 LAB — HEMOGLOBIN A1C: Hgb A1c MFr Bld: 10.8 % — ABNORMAL HIGH (ref 4.6–6.5)

## 2014-04-01 MED ORDER — INSULIN DETEMIR 100 UNIT/ML FLEXPEN: PEN_INJECTOR | SUBCUTANEOUS | Status: DC

## 2014-04-01 MED ORDER — INSULIN ASPART 100 UNIT/ML FLEXPEN: PEN_INJECTOR | SUBCUTANEOUS | Status: DC

## 2014-04-01 NOTE — Progress Notes (Signed)
OFFICE NOTE  04/01/2014  CC:  Chief Complaint  Patient presents with  . Follow-up    4 month     HPI: Patient is a 68 y.o. Caucasian male who is here for 4 mo f/u DM 2, HTN, hyperlipidemia. Pt says he feels good.  Due to phone/mail problems, he never got my recommendations after his last visit. We made sure we have approp contact phone for he and his wife today.  Checks glucose bid: fasting range 130-140s. Before supper: 240-325.  Supper is largest meal of the day.  Current levemir dosing is 20 U qhs,  Current mealtime insulin dosing is 8-10 units.  Says that if he takes over 10 units he gets cramps in legs and sometimes in fingers.  Still taking mealtime insulin hs with his levemir on many nights, often not taking any novolog at supper (he actually says he thinks he has humalog).  As long as he takes his vicodin (1/2 tab once daily 5pm daily) he has no burning, tingling, or numbness in feet. He has plans to set up a diab retinopathy screening appt soon with his ophthalmologist.  No acute vision changes.   No bp monitoring at home since his last visit.  Pertinent PMH:  Past medical, surgical, social, and family history reviewed and no changes are noted since last office visit.  MEDS:  Outpatient Prescriptions Prior to Visit  Medication Sig Dispense Refill  . aspirin 81 MG EC tablet Take 81 mg by mouth daily.        . Blood Glucose Monitoring Suppl (ONE TOUCH ULTRA SYSTEM KIT) W/DEVICE KIT Patient must check sugar TID  1 each  0  . glucose blood test strip Use as instructed  100 each  12  . HYDROcodone-acetaminophen (NORCO/VICODIN) 5-325 MG per tablet Take 1 tablet by mouth every 6 (six) hours as needed.  60 tablet  0  . insulin NPH Human (NOVOLIN N) 100 UNIT/ML injection 26 units qbreakfast and 13 units qsupper.  10 mL  11  . insulin NPH-regular Human (NOVOLIN 70/30) (70-30) 100 UNIT/ML injection 40 units SQ 30 min prior to BF and 20 units SQ 30 min prior to supper  20 mL  6  .  Insulin Pen Needle 32G X 4 MM MISC Use as direct to inject insulin. DX 250.00  100 each  6  . lisinopril (PRINIVIL,ZESTRIL) 2.5 MG tablet Take 1 tablet (2.5 mg total) by mouth daily.  90 tablet  1  . lovastatin (MEVACOR) 40 MG tablet Take 1 tablet (40 mg total) by mouth at bedtime.  90 tablet  1  . nitroGLYCERIN (NITROSTAT) 0.4 MG SL tablet Place 1 tablet (0.4 mg total) under the tongue every 5 (five) minutes as needed.  20 tablet  1  . omeprazole (PRILOSEC) 20 MG capsule 1 tab po qAM  30 capsule  6  . PRODIGY TWIST TOP LANCETS 28G MISC USE AS DIRECTED TO TEST BLOOD GLUCOSE DX: 250.02  100 each  5   No facility-administered medications prior to visit.    PE: Blood pressure 129/84, pulse 59, temperature 98 F (36.7 C), temperature source Temporal, height _0  (1.676 m), weight 130 lb 4 oz (59.081 kg), SpO2 98.00%. Gen: Alert, well appearing.  Patient is oriented to person, place, time, and situation. AFFECT: pleasant, lucid thought and speech. No further exam today.  LABS: none today  Recent labs:  Lab Results  Component Value Date   HGBA1C 10.1* 12/03/2013     Chemistry  Component Value Date/Time   NA 137 12/03/2013 0958   K 4.5 12/03/2013 0958   CL 101 12/03/2013 0958   CO2 29 12/03/2013 0958   BUN 12 12/03/2013 0958   CREATININE 1.3 12/03/2013 0958      Component Value Date/Time   CALCIUM 9.3 12/03/2013 0958   ALKPHOS 52 08/02/2013 0940   AST 20 08/02/2013 0940   ALT 21 08/02/2013 0940   BILITOT 0.9 08/02/2013 0940     Lab Results  Component Value Date   CHOL 160 08/02/2013   HDL 46.20 08/02/2013   LDLCALC 99 08/02/2013   LDLDIRECT 102.8 03/14/2012   TRIG 76.0 08/02/2013   CHOLHDL 3 08/02/2013   Lab Results  Component Value Date   WBC 6.2 12/03/2013   HGB 13.8 12/03/2013   HCT 41.2 12/03/2013   MCV 86.2 12/03/2013   PLT 207.0 12/03/2013   Lab Results  Component Value Date   TSH 2.55 08/02/2013     IMPRESSION AND PLAN:  1) DM 2, poor control, dietary  noncompliance, hx of incorrect insulin administration. Reviewed correct use of mealtime insulin and I warned him of the possible damage that may occur if he doesn't get his blood glucoses under better control.  Emphasized importance of dietary modification and offered nutritionist referral but he declined this and said he has resources/books at home. Also, I recommended he increase hs levemir to 24 units and his mealtime insulin (novolog) to 11-16-15. I recommended he stop giving himself mealtime insulin at bedtime like he has been in the habit of doing lately. Will do full foot exam at next f/u in 3 mo.  2) HTN in setting of DM 2.  BP normal here today. Encouraged pt to monitor bp at home but he doesn't seem to do this when asked. Continue current meds.  3) Hyperlipidemia: tolerating statin, compliant with med.  Dietary improvements can be made. Will recheck lipid panel next time he is here fasting.  An After Visit Summary was printed and given to the patient.  FOLLOW UP: 3 mo

## 2014-04-01 NOTE — Progress Notes (Signed)
Pre visit review using our clinic review tool, if applicable. No additional management support is needed unless otherwise documented below in the visit note. 

## 2014-04-02 ENCOUNTER — Telehealth: Payer: Self-pay | Admitting: Family Medicine

## 2014-04-02 NOTE — Telephone Encounter (Signed)
Relevant patient education mailed to patient.  

## 2014-04-03 ENCOUNTER — Encounter: Payer: Self-pay | Admitting: Family Medicine

## 2014-04-04 ENCOUNTER — Telehealth: Payer: Self-pay

## 2014-04-04 NOTE — Telephone Encounter (Signed)
Relevant patient education mailed to patient.  

## 2014-05-27 ENCOUNTER — Other Ambulatory Visit: Payer: Self-pay | Admitting: Family Medicine

## 2014-05-27 DIAGNOSIS — E1165 Type 2 diabetes mellitus with hyperglycemia: Principal | ICD-10-CM

## 2014-05-27 DIAGNOSIS — IMO0001 Reserved for inherently not codable concepts without codable children: Secondary | ICD-10-CM

## 2014-05-27 MED ORDER — OMEPRAZOLE 20 MG PO CPDR
DELAYED_RELEASE_CAPSULE | ORAL | Status: DC
Start: 2014-05-27 — End: 2014-12-09

## 2014-05-27 MED ORDER — LISINOPRIL 2.5 MG PO TABS: 2.5000 mg | ORAL_TABLET | Freq: Every day | ORAL | Status: DC

## 2014-05-28 ENCOUNTER — Telehealth: Payer: Self-pay | Admitting: Family Medicine

## 2014-05-28 MED ORDER — HYDROCODONE-ACETAMINOPHEN 5-325 MG PO TABS: 1.0000 | ORAL_TABLET | Freq: Four times a day (QID) | ORAL | Status: DC | PRN

## 2014-05-28 NOTE — Telephone Encounter (Signed)
Patient advised to pick up RX at front desk on 05/29/14.

## 2014-05-28 NOTE — Telephone Encounter (Signed)
Pt requesting RF of hydrocodone.  Last OV was 04/01/14.  Last Rx printed stated fill on or after 12/07/13.  Please advise rf.

## 2014-05-28 NOTE — Telephone Encounter (Signed)
Hydrocodone rx printed. 

## 2014-07-02 ENCOUNTER — Encounter: Payer: Self-pay | Admitting: Family Medicine

## 2014-07-02 ENCOUNTER — Ambulatory Visit (INDEPENDENT_AMBULATORY_CARE_PROVIDER_SITE_OTHER): Payer: Medicare Other | Admitting: Family Medicine

## 2014-07-02 VITALS — BP 119/73 | HR 60 | Temp 98.4°F | Resp 16 | Ht 66.0 in | Wt 129.0 lb

## 2014-07-02 DIAGNOSIS — Z23 Encounter for immunization: Secondary | ICD-10-CM

## 2014-07-02 DIAGNOSIS — IMO0001 Reserved for inherently not codable concepts without codable children: Secondary | ICD-10-CM

## 2014-07-02 DIAGNOSIS — E1165 Type 2 diabetes mellitus with hyperglycemia: Principal | ICD-10-CM

## 2014-07-02 DIAGNOSIS — L989 Disorder of the skin and subcutaneous tissue, unspecified: Secondary | ICD-10-CM

## 2014-07-02 DIAGNOSIS — Z0000 Encounter for general adult medical examination without abnormal findings: Secondary | ICD-10-CM

## 2014-07-02 DIAGNOSIS — E785 Hyperlipidemia, unspecified: Secondary | ICD-10-CM

## 2014-07-02 DIAGNOSIS — N183 Chronic kidney disease, stage 3 unspecified: Secondary | ICD-10-CM

## 2014-07-02 LAB — BASIC METABOLIC PANEL
BUN: 22 mg/dL (ref 6–23)
CALCIUM: 9.6 mg/dL (ref 8.4–10.5)
CHLORIDE: 100 meq/L (ref 96–112)
CO2: 23 mEq/L (ref 19–32)
CREATININE: 1.3 mg/dL (ref 0.4–1.5)
GFR: 56.23 mL/min — ABNORMAL LOW (ref >60.00)
GLUCOSE: 150 mg/dL — AB (ref 70–99)
Potassium: 4.6 mEq/L (ref 3.5–5.1)
Sodium: 135 mEq/L (ref 135–145)

## 2014-07-02 LAB — HEMOGLOBIN A1C: Hgb A1c MFr Bld: 10.6 % — ABNORMAL HIGH (ref 4.6–6.5)

## 2014-07-02 LAB — LIPID PANEL
CHOL/HDL RATIO: 5
Cholesterol: 227 mg/dL — ABNORMAL HIGH (ref 0–200)
HDL: 48.9 mg/dL (ref >39.00)
LDL Cholesterol: 158 mg/dL — ABNORMAL HIGH (ref 0–99)
NonHDL: 178.1
TRIGLYCERIDES: 99 mg/dL (ref 0.0–149.0)
VLDL: 19.8 mg/dL (ref 0.0–40.0)

## 2014-07-02 MED ORDER — ATORVASTATIN CALCIUM 40 MG PO TABS: 40.0000 mg | ORAL_TABLET | Freq: Every day | ORAL | Status: DC

## 2014-07-02 NOTE — Progress Notes (Signed)
Pre visit review using our clinic review tool, if applicable. No additional management support is needed unless otherwise documented below in the visit note. 

## 2014-07-02 NOTE — Progress Notes (Signed)
OFFICE NOTE  07/02/2014  CC:  Chief Complaint  Patient presents with  . Follow-up    fasting  . Referral    dermatology   HPI: Patient is a 67 y.o. Caucasian male who is here for 3 mo f/u DM 2, HTN, hyperlipidemia. Says lovastatin caused leg muscle aches so he d/c'd it.  Titrated insulin last visit.  No self titration done since then. Fastings improving to low 100s but 2HPP and hs are 180s and >200, respectively. Home bp monitoring shows normal readings.  Feet: No burning, tingling, or numbness in feet except a chronic irritating pain in both great toes that has been longstanding and unchanged and helped by small doses of vicodin.  Still hasn't gotten diab rtpthy screening exam: "I'm fixin to make an appt".  Pertinent PMH:  Past medical, surgical, social, and family history reviewed and no changes are noted since last office visit.  MEDS:  Outpatient Prescriptions Prior to Visit  Medication Sig Dispense Refill  . aspirin 81 MG EC tablet Take 81 mg by mouth daily.        . HYDROcodone-acetaminophen (NORCO/VICODIN) 5-325 MG per tablet Take 1 tablet by mouth every 6 (six) hours as needed.  60 tablet  0  . insulin aspart (NOVOLOG) 100 UNIT/ML FlexPen 12 U SQ qBF, 12 U SQ qLunch, and 16 U SQ qSupper  15 mL  11  . Insulin Detemir (LEVEMIR FLEXTOUCH) 100 UNIT/ML Pen 24 U SQ qhs  15 mL  11  . lisinopril (PRINIVIL,ZESTRIL) 2.5 MG tablet Take 1 tablet (2.5 mg total) by mouth daily.  90 tablet  1  . nitroGLYCERIN (NITROSTAT) 0.4 MG SL tablet Place 1 tablet (0.4 mg total) under the tongue every 5 (five) minutes as needed.  20 tablet  1  . omeprazole (PRILOSEC) 20 MG capsule 1 tab po qAM  90 capsule  1  . Blood Glucose Monitoring Suppl (ONE TOUCH ULTRA SYSTEM KIT) W/DEVICE KIT Patient must check sugar TID  1 each  0  . glucose blood test strip Use as instructed  100 each  12  . Insulin Pen Needle 32G X 4 MM MISC Use as direct to inject insulin. DX 250.00  100 each  6  . lovastatin (MEVACOR)  40 MG tablet Take 1 tablet (40 mg total) by mouth at bedtime.  90 tablet  1  . PRODIGY TWIST TOP LANCETS 28G MISC USE AS DIRECTED TO TEST BLOOD GLUCOSE DX: 250.02  100 each  5   No facility-administered medications prior to visit.    PE: Blood pressure 119/73, pulse 60, temperature 98.4 F (36.9 C), temperature source Temporal, resp. rate 16, height 5' 6" (1.676 m), weight 129 lb (58.514 kg), SpO2 98.00%. Gen: Alert, well appearing.  Patient is oriented to person, place, time, and situation. Foot exam - bilateral normal; no swelling, tenderness or skin or vascular lesions. Color and temperature is normal. Sensation is intact. Peripheral pulses are palpable. Toenails are normal. Skin: crusty papule on bridge of nose.  IMPRESSION AND PLAN:  1) DM 2, poor control but improving. HbA1c today. Feet exam normal today. Eye exam: pt to make appt ASAP.  2) Hyperlipidemia: intolerant of simva and lovastatin. FLP today.  Start trial of atorvastatin 40mg qd.  3) HTN in setting of DM 2 and CRI stage 3: BMET today.  4) Prev health care: Prevnar 13 IM today.  5) Facial skin lesion: pt desires referral to derm--ordered today.  An After Visit Summary was printed and given   to the patient.  FOLLOW UP: 73mo

## 2014-07-02 NOTE — Addendum Note (Signed)
Addended by: Eulah PontALBRIGHT, LISA M on: 07/02/2014 09:00 AM   Modules accepted: Orders

## 2014-07-03 ENCOUNTER — Other Ambulatory Visit: Payer: Self-pay | Admitting: Family Medicine

## 2014-07-03 DIAGNOSIS — E1165 Type 2 diabetes mellitus with hyperglycemia: Principal | ICD-10-CM

## 2014-07-03 DIAGNOSIS — IMO0001 Reserved for inherently not codable concepts without codable children: Secondary | ICD-10-CM

## 2014-07-03 DIAGNOSIS — E785 Hyperlipidemia, unspecified: Secondary | ICD-10-CM

## 2014-07-03 MED ORDER — NITROGLYCERIN 0.4 MG SL SUBL: 0.4000 mg | SUBLINGUAL_TABLET | SUBLINGUAL | Status: DC | PRN

## 2014-08-27 ENCOUNTER — Other Ambulatory Visit: Payer: Self-pay | Admitting: Family Medicine

## 2014-08-27 MED ORDER — ONETOUCH ULTRASOFT LANCETS MISC: Status: DC

## 2014-08-28 ENCOUNTER — Telehealth: Payer: Self-pay | Admitting: Family Medicine

## 2014-08-28 NOTE — Telephone Encounter (Signed)
[facility] told patient they have not received the Rx for his lancets sent in 08/27/14. Please contact pharmacy

## 2014-08-29 MED ORDER — ONETOUCH ULTRASOFT LANCETS MISC: Status: DC

## 2014-08-29 NOTE — Telephone Encounter (Signed)
Resent

## 2014-09-08 ENCOUNTER — Telehealth: Payer: Self-pay | Admitting: *Deleted

## 2014-09-08 NOTE — Telephone Encounter (Signed)
[facility] sent [facility] Please advise?

## 2014-09-08 NOTE — Telephone Encounter (Signed)
Yes, ok to RF as previously prescribed, 12 additional RF's.-thx

## 2014-09-09 MED ORDER — ONETOUCH DELICA LANCETS FINE MISC: Status: DC

## 2014-09-10 ENCOUNTER — Other Ambulatory Visit: Payer: Self-pay

## 2014-09-17 ENCOUNTER — Telehealth: Payer: Self-pay

## 2014-09-17 NOTE — Telephone Encounter (Signed)
No eye exam this year.  Plans to schedule an appointment next week.

## 2014-09-25 ENCOUNTER — Telehealth: Payer: Self-pay

## 2014-09-25 MED ORDER — HYDROCODONE-ACETAMINOPHEN 5-325 MG PO TABS: 1.0000 | ORAL_TABLET | Freq: Four times a day (QID) | ORAL | Status: DC | PRN

## 2014-09-25 NOTE — Telephone Encounter (Signed)
Spoke with pt, advised Rx ready to pick up. 

## 2014-09-25 NOTE — Telephone Encounter (Signed)
Vicodin rx printed. 

## 2014-09-25 NOTE — Telephone Encounter (Signed)
Pt called for a refill on his pain medication, Hydrocodone. Last refill 05/28/2014. Last OV 07/02/2014

## 2014-10-13 ENCOUNTER — Other Ambulatory Visit: Payer: Self-pay | Admitting: Family Medicine

## 2014-10-13 NOTE — Telephone Encounter (Signed)
Pt pharmacy requested rf for ultra touch mini meter.  Dr. Milinda CaveMcGowen signed rx and I faxed it back to pharmacy.

## 2014-10-28 ENCOUNTER — Ambulatory Visit: Payer: Medicare Other | Admitting: Family Medicine

## 2014-10-31 ENCOUNTER — Ambulatory Visit: Payer: Medicare Other | Admitting: Family Medicine

## 2014-11-12 ENCOUNTER — Encounter: Payer: Self-pay | Admitting: Family Medicine

## 2014-11-12 ENCOUNTER — Ambulatory Visit (INDEPENDENT_AMBULATORY_CARE_PROVIDER_SITE_OTHER): Payer: Medicare Other | Admitting: Family Medicine

## 2014-11-12 VITALS — BP 157/84 | HR 60 | Temp 98.0°F | Resp 18 | Ht 66.0 in | Wt 135.0 lb

## 2014-11-12 DIAGNOSIS — G5602 Carpal tunnel syndrome, left upper limb: Secondary | ICD-10-CM

## 2014-11-12 DIAGNOSIS — N183 Chronic kidney disease, stage 3 unspecified: Secondary | ICD-10-CM

## 2014-11-12 DIAGNOSIS — Z125 Encounter for screening for malignant neoplasm of prostate: Secondary | ICD-10-CM

## 2014-11-12 DIAGNOSIS — E785 Hyperlipidemia, unspecified: Secondary | ICD-10-CM

## 2014-11-12 DIAGNOSIS — E1140 Type 2 diabetes mellitus with diabetic neuropathy, unspecified: Secondary | ICD-10-CM

## 2014-11-12 DIAGNOSIS — G5603 Carpal tunnel syndrome, bilateral upper limbs: Secondary | ICD-10-CM

## 2014-11-12 DIAGNOSIS — J438 Other emphysema: Secondary | ICD-10-CM

## 2014-11-12 DIAGNOSIS — G5601 Carpal tunnel syndrome, right upper limb: Secondary | ICD-10-CM

## 2014-11-12 LAB — PSA, MEDICARE: PSA: 0.68 ng/mL (ref 0.10–4.00)

## 2014-11-12 LAB — HEMOGLOBIN A1C: Hgb A1c MFr Bld: 9.9 % — ABNORMAL HIGH (ref 4.6–6.5)

## 2014-11-12 MED ORDER — INSULIN ASPART 100 UNIT/ML FLEXPEN: PEN_INJECTOR | SUBCUTANEOUS | Status: DC

## 2014-11-12 MED ORDER — PRAVASTATIN SODIUM 40 MG PO TABS: 40.0000 mg | ORAL_TABLET | Freq: Every day | ORAL | Status: DC

## 2014-11-12 NOTE — Progress Notes (Signed)
Pre visit review using our clinic review tool, if applicable. No additional management support is needed unless otherwise documented below in the visit note. 

## 2014-11-12 NOTE — Patient Instructions (Signed)
If you are able to tolerate your pravastatin then come to your next follow up appointment fasting.  If unable to tolerate it, no need to fast.

## 2014-11-12 NOTE — Progress Notes (Signed)
Office Note 11/12/2014  CC:  Chief Complaint  Patient presents with  . Follow-up    fasting   HPI:  Derrick [name] is a 68 y.o. White male who is here for 4 mo f/u DM 2, Hyperlip, CRI stage III, COPD.  Unable to tolerate recent 1 mo trial of lipitor due to myalgias.  He is willing to try one more generic statin (he has had same rxn to 3 generics now).  Fasting glucs around 120. PP glucs still 170s to 300s.  Still eating "whatever I can get down". Currently on 26 levemir qhs , and 16-16-20 of novolog qAC.  Describes about 1 mo of all fingers of both hands tingling/going to sleep, occurs when wrists are flexed, sometimes wakes him up from sleep. He straightens wrists out into neutral position and it resolves.  No pain or weakness.  No new repetitive motions with hands, fingers, wrists lately.   Past Medical History  Diagnosis Date  . Hyperlipidemia   . COPD (chronic obstructive pulmonary disease)   . Diabetes mellitus   . Coronary atherosclerosis of native coronary vessel     S/P NSTEMI 04/11/2009--DEL stent to LAD.  Nuclear stress test 11/2010 NEG, EF normal  . Diabetic peripheral neuropathy     Painful; 1 vicodin bid very helpful (pt resistant to alternative tx's)  . Early cataracts, bilateral 04/24/12    [name] in [place], [place]  . Herpes zoster     Two separate outbreaks--one on left axillary/back region, one on right axillary/back region  . Chronic renal insufficiency, stage III (moderate) 2013    CrCl est high 50s (borderline stage II/III   . Mitral regurgitation 08/2013    Moderate (echo)    Past Surgical History  Procedure Laterality Date  . Spine surgery      L-Spine 2000, C-Spine 2004  . Cryotherapy to ak lesion on nose  2/11  . Colonoscopy  12/2010    Normal screening colonoscopy; rpt 10 yrs  . Eye exam,diabetic  02/2011    Normal diabetic retinopathy screening exam at [facility] in [place], [place].  . Cardiovascular stress test  11/2010    No  ischemia/normal EF  . Coronary stent placement  04/2009    DES to LAD  . Transthoracic echocardiogram  08/2013    LVH, EF 55-65%, no WMA.  Moderate mitral regurg    Family History  Problem Relation Age of Onset  . Coronary artery disease      family hx of  . Cancer Mother     brain  . Diabetes Father   . Heart disease Father   . Diabetes Brother   . Heart disease Brother   . Diabetes Son   . Hyperlipidemia Son     History   Social History  . Marital Status: Married    Spouse Name: N/A    Number of Children: N/A  . Years of Education: N/A   Occupational History  . Not on file.   Social History Main Topics  . Smoking status: Former Smoker    Types: Cigarettes    Quit date: 01/06/2011  . Smokeless tobacco: Never Used  . Alcohol Use: No  . Drug Use: No  . Sexual Activity: No   Other Topics Concern  . Not on file   Social History Narrative   Married, works part time at [employer] in [place], [place].   Former smoker: quit 01/2011.  No alcohol or drugs.   No  formal exercise.    Outpatient Prescriptions Prior to Visit  Medication Sig Dispense Refill  . aspirin 81 MG EC tablet Take 81 mg by mouth daily.      [name] HYDROcodone-acetaminophen (NORCO/VICODIN) 5-325 MG per tablet Take 1 tablet by mouth every 6 (six) hours as needed. 60 tablet 0  . insulin aspart (NOVOLOG) 100 UNIT/ML FlexPen 12 U SQ qBF, 12 U SQ qLunch, and 16 U SQ qSupper 15 mL 11  . Insulin Detemir (LEVEMIR FLEXTOUCH) 100 UNIT/ML Pen 24 U SQ qhs 15 mL 11  . lisinopril (PRINIVIL,ZESTRIL) 2.5 MG tablet Take 1 tablet (2.5 mg total) by mouth daily. 90 tablet 1  . nitroGLYCERIN (NITROSTAT) 0.4 MG SL tablet Place 1 tablet (0.4 mg total) under the tongue every 5 (five) minutes as needed. 20 tablet 1  . omeprazole (PRILOSEC) 20 MG capsule 1 tab po qAM 90 capsule 1  . atorvastatin (LIPITOR) 40 MG tablet Take 1 tablet (40 mg total) by mouth daily. (Patient not taking: Reported on 11/12/2014) 30 tablet 6  .  Blood Glucose Monitoring Suppl (ONE TOUCH ULTRA SYSTEM KIT) W/DEVICE KIT Patient must check sugar TID 1 each 0  . glucose blood test strip Use as instructed 100 each 12  . Insulin Pen Needle 32G X 4 MM MISC Use as direct to inject insulin. DX 250.00 100 each 6  . Lancets (ONETOUCH ULTRASOFT) lancets Use as instructed    . ONETOUCH DELICA LANCETS FINE MISC Use 1 Lancet To Check Glucose 4 Times Daily As Needed 100 each 12   No facility-administered medications prior to visit.    Allergies  Allergen Reactions  . Atorvastatin     Muscle cramps   . Lovastatin     Muscle cramps  . Simvastatin Other (See Comments)    Muscle pain    ROS Review of Systems  Constitutional: Negative for fever and fatigue.  HENT: Negative for congestion and sore throat.   Eyes: Negative for visual disturbance.  Respiratory: Negative for cough.   Cardiovascular: Negative for chest pain.  Gastrointestinal: Negative for nausea and abdominal pain.  Genitourinary: Negative for dysuria.  Musculoskeletal: Negative for back pain and joint swelling.  Skin: Negative for rash.  Neurological: Negative for weakness and headaches.  Hematological: Negative for adenopathy.    PE; Blood pressure 157/84, pulse 60, temperature 98 F (36.7 C), temperature source Temporal, resp. rate 18, height $RemoveBe'5\' 6"'JlknQlvGC$  (1.676 m), weight 135 lb (61.236 kg), SpO2 99 %. Gen: Alert, well appearing.  Patient is oriented to person, place, time, and situation. KGM:WNUU: no injection, icteris, swelling, or exudate.  EOMI, PERRLA. Mouth: lips without lesion/swelling.  Oral mucosa pink and moist. Oropharynx without erythema, exudate, or swelling.  Neck - No masses or thyromegaly or limitation in range of motion CV: RRR, no m/r/g.   LUNGS: CTA bilat, nonlabored resps, good aeration in all lung fields. ABD: soft, NT, ND, BS normal.  No hepatospenomegaly or mass.  No bruits. EXT: no clubbing, cyanosis, or edema.  Rectal: pt deferred this today, asked  that it be done at next  Arms: UA and lower arms/hands with 5/5 strength bilat. No muscle wasting.  Sensation intact bilat.  Phalen's and Tinel's testing negative bilaterally.  Radial pulses 1+ bilat.  Hands warm, brisk cap refill bilat.    Pertinent labs:  NONE TODAY RECENT: Lab Results  Component Value Date   HGBA1C 10.6* 07/02/2014     Chemistry      Component Value Date/Time   NA 135  07/02/2014 0824   K 4.6 07/02/2014 0824   CL 100 07/02/2014 0824   CO2 23 07/02/2014 0824   BUN 22 07/02/2014 0824   CREATININE 1.3 07/02/2014 0824      Component Value Date/Time   CALCIUM 9.6 07/02/2014 0824   ALKPHOS 52 08/02/2013 0940   AST 20 08/02/2013 0940   ALT 21 08/02/2013 0940   BILITOT 0.9 08/02/2013 0940     Lab Results  Component Value Date   PSA 0.54 08/02/2013   PSA 0.51 03/14/2012   Lab Results  Component Value Date   WBC 6.2 12/03/2013   HGB 13.8 12/03/2013   HCT 41.2 12/03/2013   MCV 86.2 12/03/2013   PLT 207.0 12/03/2013   Lab Results  Component Value Date   CHOL 227* 07/02/2014   HDL 48.90 07/02/2014   LDLCALC 158* 07/02/2014   LDLDIRECT 102.8 03/14/2012   TRIG 99.0 07/02/2014   CHOLHDL 5 07/02/2014    ASSESSMENT AND PLAN:   1) DM 2, with complications, poor control. Noncompliant with diet and exercise. Control improving a little as we titrate insulins up.  Check HbA1c today.  He is reminded again he is overdue for diab retpthy screening exam. Continue levemir 26 U qhs.  Increase Novolog to 20 U BF, 20 U Lunch, and 26 U supper.  2) Hyperlipidemia: statin intolerant (to lovast, simva, and atorva).  Trial of pravastatin 26m qod at first and then if tolerates it go to taking it daily.  3) CRI stage 3: BMET stable 07/02/14.  Repeat this at next f/u in 4 mo.  4) CAD: asymptomatic.  5) COPD; asymptomatic.  6) Prostate cancer screening: this will be the final time we screen him for this.  I did PSA testing today but he wanted to defer the DRE until next  f/u visit.  7) Carpal tunnel syndrome likely, bilat: trial of 1 mo of wrist splinting at night and also during daytime when he can.  Pt fitted with wrist splints for each wrist today in office.  If no improvement in hand tingling in 1 mo he'll call and I'll ask either ortho or neuro to see him for 2nd opinion.  Once again, he declined flu vaccine.  FOLLOW UP:  4 mo

## 2014-11-20 ENCOUNTER — Telehealth: Payer: Self-pay | Admitting: Family Medicine

## 2014-11-20 NOTE — Telephone Encounter (Signed)
Patient needs refills of his insulin syringes (he has his gauges) [facility] [place]

## 2014-11-21 ENCOUNTER — Telehealth: Payer: Self-pay | Admitting: Family Medicine

## 2014-11-21 NOTE — Telephone Encounter (Signed)
Spoke to pharmacist.  Pt should not need syringes, he has insulin pens.  Pt will need to contact the pharmacy. 

## 2014-11-21 NOTE — Telephone Encounter (Signed)
Spoke to pharmacist.  Pt should not need syringes, he has insulin pens.  Pt will need to contact the pharmacy.

## 2014-11-21 NOTE — Telephone Encounter (Signed)
Needs refill called in for syringes.

## 2014-11-24 NOTE — Telephone Encounter (Signed)
I called patient & he said he has already gotten them.

## 2014-11-25 ENCOUNTER — Telehealth (HOSPITAL_COMMUNITY): Payer: Self-pay | Admitting: *Deleted

## 2014-11-25 NOTE — Telephone Encounter (Signed)
Spoke to pt to follow-up on his DM eye exam that was due & was discussed on his last PCP visit. Pt states "will make the appointment after christmas season".

## 2014-12-09 ENCOUNTER — Other Ambulatory Visit: Payer: Self-pay | Admitting: Family Medicine

## 2015-01-20 ENCOUNTER — Telehealth: Payer: Self-pay | Admitting: Family Medicine

## 2015-01-20 MED ORDER — HYDROCODONE-ACETAMINOPHEN 5-325 MG PO TABS: 1.0000 | ORAL_TABLET | Freq: Four times a day (QID) | ORAL | Status: DC | PRN

## 2015-01-20 NOTE — Telephone Encounter (Signed)
Rx ready for p/u.  Pt aware.

## 2015-01-20 NOTE — Telephone Encounter (Signed)
Hydrocodone rx printed. 

## 2015-01-20 NOTE — Telephone Encounter (Signed)
Hydrocodone, please call patient when ready for pu

## 2015-01-20 NOTE — Telephone Encounter (Signed)
Pt requesting hydrocodone.  Last OV was12/09/15.  Last Rx was printed 09/26/2015.  Please advise.

## 2015-01-28 ENCOUNTER — Other Ambulatory Visit: Payer: Self-pay | Admitting: Family Medicine

## 2015-03-03 ENCOUNTER — Other Ambulatory Visit: Payer: Self-pay | Admitting: Family Medicine

## 2015-03-09 ENCOUNTER — Encounter: Payer: Self-pay | Admitting: Family Medicine

## 2015-03-09 ENCOUNTER — Ambulatory Visit (INDEPENDENT_AMBULATORY_CARE_PROVIDER_SITE_OTHER): Payer: Medicare Other | Admitting: Family Medicine

## 2015-03-09 VITALS — BP 120/70 | HR 57 | Temp 97.6°F | Ht 66.0 in | Wt 131.0 lb

## 2015-03-09 DIAGNOSIS — E1065 Type 1 diabetes mellitus with hyperglycemia: Secondary | ICD-10-CM

## 2015-03-09 DIAGNOSIS — E785 Hyperlipidemia, unspecified: Secondary | ICD-10-CM

## 2015-03-09 DIAGNOSIS — G5601 Carpal tunnel syndrome, right upper limb: Secondary | ICD-10-CM | POA: Diagnosis not present

## 2015-03-09 DIAGNOSIS — E1069 Type 1 diabetes mellitus with other specified complication: Secondary | ICD-10-CM

## 2015-03-09 DIAGNOSIS — N183 Chronic kidney disease, stage 3 unspecified: Secondary | ICD-10-CM

## 2015-03-09 DIAGNOSIS — E108 Type 1 diabetes mellitus with unspecified complications: Principal | ICD-10-CM

## 2015-03-09 DIAGNOSIS — G5602 Carpal tunnel syndrome, left upper limb: Secondary | ICD-10-CM

## 2015-03-09 DIAGNOSIS — G5603 Carpal tunnel syndrome, bilateral upper limbs: Secondary | ICD-10-CM

## 2015-03-09 DIAGNOSIS — IMO0002 Reserved for concepts with insufficient information to code with codable children: Secondary | ICD-10-CM

## 2015-03-09 DIAGNOSIS — Z125 Encounter for screening for malignant neoplasm of prostate: Secondary | ICD-10-CM

## 2015-03-09 LAB — BASIC METABOLIC PANEL
BUN: 16 mg/dL (ref 6–23)
CALCIUM: 9.5 mg/dL (ref 8.4–10.5)
CO2: 32 meq/L (ref 19–32)
Chloride: 100 mEq/L (ref 96–112)
Creatinine, Ser: 1.41 mg/dL (ref 0.40–1.50)
GFR: 52.91 mL/min — ABNORMAL LOW (ref >60.00)
GLUCOSE: 164 mg/dL — AB (ref 70–99)
Potassium: 4.6 mEq/L (ref 3.5–5.1)
SODIUM: 136 meq/L (ref 135–145)

## 2015-03-09 LAB — MICROALBUMIN / CREATININE URINE RATIO
Creatinine,U: 57.5 mg/dL
MICROALB UR: 0.8 mg/dL (ref 0.0–1.9)
MICROALB/CREAT RATIO: 1.4 mg/g (ref 0.0–30.0)

## 2015-03-09 LAB — HEMOGLOBIN A1C: HEMOGLOBIN A1C: 8.6 % — AB (ref 4.6–6.5)

## 2015-03-09 NOTE — Progress Notes (Signed)
Pre visit review using our clinic review tool, if applicable. No additional management support is needed unless otherwise documented below in the visit note. 

## 2015-03-09 NOTE — Progress Notes (Signed)
OFFICE NOTE  03/09/2015  CC:  Chief Complaint  Patient presents with  . Follow-up   HPI: Patient is a 69 y.o. Caucasian male who is here for 4 mo f/u DM 2, hyperlip, CRI stage III. Last f/u we decided to try one more statin (pravastatin) but it caused cramps so he could not continue it.  Also dx'd him with CTS bilat and also fitted pt with wrist splints to wear for 1 mo trial. His sx's are essentially unchanged, still with fingers numb/tingling intermittently, occ wrist pains. He has bee compliant with the splints.  He is making some self management decisions with his diabetes that are typical for him.  Although he reports some hypoglycemia, the doses he gives at mealtime that cause this are not far off from a dose that DOESN't cause hypoglycemia.   He often cuts back inappropriately on premeal insulin dose if gluc is high 100s or 200s. Was instructed to take 26 U levemir hs  But takes 30 qhs.  He does say his glucs are closer to 100 with this dosing, which is great, but I am frustrated with my inability to get pt to understand my reasoning behind my insulin dosing instructions and his failure to follow them.  I have never helped him get his A1c below 8, and in the last 18+ mo it has been 10+. Behind on eye exam due to lack of insurance coverage for this.  He agrees to allow the DRE exam today--we got PSA last visit and he wanted to defer this exam until today's visit.   Pertinent PMH:  Past medical, surgical, social, and family history reviewed and no changes are noted since last office visit.  MEDS:  Outpatient Prescriptions Prior to Visit  Medication Sig Dispense Refill  . aspirin 81 MG EC tablet Take 81 mg by mouth daily.      . Blood Glucose Monitoring Suppl (ONE TOUCH ULTRA SYSTEM KIT) W/DEVICE KIT Patient must check sugar TID 1 each 0  . HYDROcodone-acetaminophen (NORCO/VICODIN) 5-325 MG per tablet Take 1 tablet by mouth every 6 (six) hours as needed. 60 tablet 0  . insulin  aspart (NOVOLOG) 100 UNIT/ML FlexPen 20 U SQ qBF, 20 U SQ qLunch, and 26 U SQ qSupper (Patient not taking: Reported on 03/09/2015) 15 mL 11  . Insulin Detemir (LEVEMIR FLEXTOUCH) 100 UNIT/ML Pen 24 U SQ qhs 15 mL 11  . Insulin Pen Needle 32G X 4 MM MISC Use as direct to inject insulin. DX 250.00 100 each 6  . Lancets (ONETOUCH ULTRASOFT) lancets Use as instructed    . lisinopril (PRINIVIL,ZESTRIL) 2.5 MG tablet TAKE ONE TABLET BY MOUTH ONCE DAILY 90 tablet 0  . nitroGLYCERIN (NITROSTAT) 0.4 MG SL tablet Place 1 tablet (0.4 mg total) under the tongue every 5 (five) minutes as needed. 20 tablet 1  . omeprazole (PRILOSEC) 20 MG capsule TAKE ONE CAPSULE BY MOUTH IN THE MORNING 90 capsule 0  . ONE TOUCH ULTRA TEST test strip USE ONE STRIP TO CHECK GLUCOSE 4 TIMES DAILY AS NEEDED 100 each 12  . ONETOUCH DELICA LANCETS FINE MISC Use 1 Lancet To Check Glucose 4 Times Daily As Needed 100 each 12  . pravastatin (PRAVACHOL) 40 MG tablet Take 1 tablet (40 mg total) by mouth daily. 30 tablet 3   No facility-administered medications prior to visit.    PE: Blood pressure 120/70, pulse 57, temperature 97.6 F (36.4 C), temperature source Oral, height $RemoveBefo'5\' 6"'GpkknDDsUSm$  (1.676 m), weight 131 lb (59.421  kg), SpO2 97 %. Gen: Alert, well appearing.  Patient is oriented to person, place, time, and situation. AFFECT: pleasant, lucid thought and speech. No further exam today.  LAB:  Lab Results  Component Value Date   HGBA1C 9.9* 11/12/2014     Chemistry      Component Value Date/Time   NA 135 07/02/2014 0824   K 4.6 07/02/2014 0824   CL 100 07/02/2014 0824   CO2 23 07/02/2014 0824   BUN 22 07/02/2014 0824   CREATININE 1.3 07/02/2014 0824      Component Value Date/Time   CALCIUM 9.6 07/02/2014 0824   ALKPHOS 52 08/02/2013 0940   AST 20 08/02/2013 0940   ALT 21 08/02/2013 0940   BILITOT 0.9 08/02/2013 0940       IMPRESSION AND PLAN:  1) DM 2, with complications and poor control. Noncompliance with diet  and exercise, also I cannot get him to follow a consistent approach with his insulin regimen.  I would like an endocrinologist to manage his DM to give him the best chance of getting A1c down at this time.  Urine microalb/cr today, A1c today.  Referred to Dr. Cruzita Lederer today.  2) CRI, stage III: BMET today.  3) Bilat CTS: not improved with 4 mo of consistent wrist splinting.  Ortho referral ordered today.  4) Hyperlipidemia: statin intolerant (he has failed trial of 4 different ones and doesn't want to try another).  5) Prostate ca screening: PSA recently normal.  DRE today normal. He says he does not want any further prostate cancer screening after today.  An After Visit Summary was printed and given to the patient.  FOLLOW UP: 6 mo

## 2015-03-10 ENCOUNTER — Encounter: Payer: Self-pay | Admitting: Internal Medicine

## 2015-03-12 ENCOUNTER — Other Ambulatory Visit: Payer: Self-pay | Admitting: Family Medicine

## 2015-04-07 ENCOUNTER — Ambulatory Visit: Payer: Medicare Other | Admitting: Internal Medicine

## 2015-04-09 DIAGNOSIS — G5601 Carpal tunnel syndrome, right upper limb: Secondary | ICD-10-CM | POA: Diagnosis not present

## 2015-04-09 DIAGNOSIS — G5602 Carpal tunnel syndrome, left upper limb: Secondary | ICD-10-CM | POA: Diagnosis not present

## 2015-04-10 ENCOUNTER — Ambulatory Visit: Payer: Medicare Other | Admitting: Internal Medicine

## 2015-04-15 DIAGNOSIS — M25551 Pain in right hip: Secondary | ICD-10-CM | POA: Diagnosis not present

## 2015-04-25 DIAGNOSIS — G5601 Carpal tunnel syndrome, right upper limb: Secondary | ICD-10-CM | POA: Diagnosis not present

## 2015-04-25 DIAGNOSIS — G5602 Carpal tunnel syndrome, left upper limb: Secondary | ICD-10-CM | POA: Diagnosis not present

## 2015-05-07 ENCOUNTER — Telehealth: Payer: Self-pay | Admitting: Family Medicine

## 2015-05-07 ENCOUNTER — Other Ambulatory Visit: Payer: Self-pay | Admitting: Family Medicine

## 2015-05-07 MED ORDER — HYDROCODONE-ACETAMINOPHEN 5-325 MG PO TABS: 1.0000 | ORAL_TABLET | Freq: Four times a day (QID) | ORAL | Status: DC | PRN

## 2015-05-07 NOTE — Telephone Encounter (Signed)
Pt needs a refill on his Hydroco. He would like to pick up today. Please call when it is available.

## 2015-05-07 NOTE — Telephone Encounter (Signed)
Pt is requesting refill for Hydro/APAP. LOV 03/09/15, up coming ov 09/08/15, last written: 01/20/15 #60 w/ 0RF. Please advise. Thanks.

## 2015-05-07 NOTE — Telephone Encounter (Signed)
Pt needs a refill on his hydrocod. Please call when it is ready so he can come and pick up.

## 2015-05-15 DIAGNOSIS — G5602 Carpal tunnel syndrome, left upper limb: Secondary | ICD-10-CM | POA: Diagnosis not present

## 2015-05-15 DIAGNOSIS — G5601 Carpal tunnel syndrome, right upper limb: Secondary | ICD-10-CM | POA: Diagnosis not present

## 2015-05-22 ENCOUNTER — Encounter (HOSPITAL_BASED_OUTPATIENT_CLINIC_OR_DEPARTMENT_OTHER): Payer: Self-pay | Admitting: *Deleted

## 2015-05-22 NOTE — Progress Notes (Signed)
To [facility] at 1215-Istat ,Ekg on arrival-reviewed insulin doses with Dr Acey Lav MDA-instructed to take his full humalog dose that evening,reduce his bedtime levimir to half (12 units) Npo of solids after Mn-clear liquids only (no dairy,pulp)till 0700 ,then Npo -will take omeprazole,hydrocodone that am.

## 2015-05-28 ENCOUNTER — Other Ambulatory Visit: Payer: Self-pay

## 2015-05-28 ENCOUNTER — Encounter (HOSPITAL_BASED_OUTPATIENT_CLINIC_OR_DEPARTMENT_OTHER)
Admission: RE | Disposition: A | Discharge status: Home / Self Care | Payer: Self-pay | Source: Ambulatory Visit | Attending: Orthopedic Surgery

## 2015-05-28 ENCOUNTER — Ambulatory Visit (HOSPITAL_BASED_OUTPATIENT_CLINIC_OR_DEPARTMENT_OTHER): Payer: Medicare Other | Admitting: Anesthesiology

## 2015-05-28 ENCOUNTER — Encounter (HOSPITAL_BASED_OUTPATIENT_CLINIC_OR_DEPARTMENT_OTHER): Payer: Self-pay | Admitting: *Deleted

## 2015-05-28 ENCOUNTER — Ambulatory Visit (HOSPITAL_BASED_OUTPATIENT_CLINIC_OR_DEPARTMENT_OTHER)
Admission: RE | Admit: 2015-05-28 | Discharge: 2015-05-28 | Disposition: A | Discharge status: Home / Self Care | Payer: Medicare Other | Source: Ambulatory Visit | Attending: Orthopedic Surgery | Admitting: Orthopedic Surgery

## 2015-05-28 DIAGNOSIS — Z888 Allergy status to other drugs, medicaments and biological substances status: Secondary | ICD-10-CM | POA: Insufficient documentation

## 2015-05-28 DIAGNOSIS — I340 Nonrheumatic mitral (valve) insufficiency: Secondary | ICD-10-CM | POA: Insufficient documentation

## 2015-05-28 DIAGNOSIS — E785 Hyperlipidemia, unspecified: Secondary | ICD-10-CM | POA: Insufficient documentation

## 2015-05-28 DIAGNOSIS — I129 Hypertensive chronic kidney disease with stage 1 through stage 4 chronic kidney disease, or unspecified chronic kidney disease: Secondary | ICD-10-CM | POA: Diagnosis not present

## 2015-05-28 DIAGNOSIS — I2510 Atherosclerotic heart disease of native coronary artery without angina pectoris: Secondary | ICD-10-CM | POA: Diagnosis not present

## 2015-05-28 DIAGNOSIS — Z87891 Personal history of nicotine dependence: Secondary | ICD-10-CM | POA: Diagnosis not present

## 2015-05-28 DIAGNOSIS — E1042 Type 1 diabetes mellitus with diabetic polyneuropathy: Secondary | ICD-10-CM | POA: Diagnosis not present

## 2015-05-28 DIAGNOSIS — J449 Chronic obstructive pulmonary disease, unspecified: Secondary | ICD-10-CM | POA: Diagnosis not present

## 2015-05-28 DIAGNOSIS — Z7982 Long term (current) use of aspirin: Secondary | ICD-10-CM | POA: Diagnosis not present

## 2015-05-28 DIAGNOSIS — Z794 Long term (current) use of insulin: Secondary | ICD-10-CM | POA: Diagnosis not present

## 2015-05-28 DIAGNOSIS — D649 Anemia, unspecified: Secondary | ICD-10-CM | POA: Insufficient documentation

## 2015-05-28 DIAGNOSIS — N183 Chronic kidney disease, stage 3 (moderate): Secondary | ICD-10-CM | POA: Insufficient documentation

## 2015-05-28 DIAGNOSIS — I252 Old myocardial infarction: Secondary | ICD-10-CM | POA: Diagnosis not present

## 2015-05-28 DIAGNOSIS — G5602 Carpal tunnel syndrome, left upper limb: Secondary | ICD-10-CM | POA: Diagnosis not present

## 2015-05-28 DIAGNOSIS — M79601 Pain in right arm: Secondary | ICD-10-CM

## 2015-05-28 HISTORY — PX: CARPAL TUNNEL RELEASE: SHX101

## 2015-05-28 LAB — POCT I-STAT 4, (NA,K, GLUC, HGB,HCT)
GLUCOSE: 176 mg/dL — AB (ref 65–99)
HCT: 41 % (ref 39.0–52.0)
Hemoglobin: 13.9 g/dL (ref 13.0–17.0)
POTASSIUM: 4.4 mmol/L (ref 3.5–5.1)
SODIUM: 139 mmol/L (ref 135–145)

## 2015-05-28 LAB — GLUCOSE, CAPILLARY: Glucose-Capillary: 146 mg/dL — ABNORMAL HIGH (ref 65–99)

## 2015-05-28 SURGERY — CARPAL TUNNEL RELEASE
Anesthesia: Monitor Anesthesia Care | Site: Wrist | Laterality: Left

## 2015-05-28 MED ORDER — LIDOCAINE HCL (PF) 1 % IJ SOLN
INTRAMUSCULAR | Status: DC | PRN
  Administered 2015-05-28: 10 mL

## 2015-05-28 MED ORDER — HYDROCODONE-ACETAMINOPHEN 5-325 MG PO TABS: 1.0000 | ORAL_TABLET | Freq: Four times a day (QID) | ORAL | Status: DC | PRN

## 2015-05-28 MED ORDER — CHLORHEXIDINE GLUCONATE 4 % EX LIQD
60.0000 mL | Freq: Once | CUTANEOUS | Status: DC
  Filled 2015-05-28: qty 60

## 2015-05-28 MED ORDER — FENTANYL CITRATE (PF) 100 MCG/2ML IJ SOLN
INTRAMUSCULAR | Status: DC | PRN
  Administered 2015-05-28: 50 ug via INTRAVENOUS

## 2015-05-28 MED ORDER — MIDAZOLAM HCL 2 MG/2ML IJ SOLN
INTRAMUSCULAR | Status: AC
  Filled 2015-05-28: qty 2

## 2015-05-28 MED ORDER — FENTANYL CITRATE (PF) 100 MCG/2ML IJ SOLN
25.0000 ug | INTRAMUSCULAR | Status: DC | PRN
Start: 2015-05-28 — End: 2015-05-29
  Filled 2015-05-28: qty 1

## 2015-05-28 MED ORDER — LIDOCAINE HCL (CARDIAC) 20 MG/ML IV SOLN
INTRAVENOUS | Status: DC | PRN
  Administered 2015-05-28: 25 mg via INTRAVENOUS

## 2015-05-28 MED ORDER — CEFAZOLIN SODIUM-DEXTROSE 2-3 GM-% IV SOLR
INTRAVENOUS | Status: AC
  Filled 2015-05-28: qty 50

## 2015-05-28 MED ORDER — CEFAZOLIN SODIUM-DEXTROSE 2-3 GM-% IV SOLR
2.0000 g | INTRAVENOUS | Status: AC
  Administered 2015-05-28 (×2): 2 g via INTRAVENOUS
  Filled 2015-05-28: qty 50

## 2015-05-28 MED ORDER — FENTANYL CITRATE (PF) 100 MCG/2ML IJ SOLN
INTRAMUSCULAR | Status: AC
  Filled 2015-05-28: qty 4

## 2015-05-28 MED ORDER — MIDAZOLAM HCL 5 MG/5ML IJ SOLN
INTRAMUSCULAR | Status: DC | PRN
  Administered 2015-05-28: 1 mg via INTRAVENOUS

## 2015-05-28 MED ORDER — BUPIVACAINE HCL (PF) 0.25 % IJ SOLN
INTRAMUSCULAR | Status: DC | PRN
  Administered 2015-05-28: 10 mL

## 2015-05-28 MED ORDER — ONDANSETRON HCL 4 MG/2ML IJ SOLN
INTRAMUSCULAR | Status: DC | PRN
  Administered 2015-05-28: 4 mg via INTRAVENOUS

## 2015-05-28 MED ORDER — PROPOFOL 10 MG/ML IV BOLUS
INTRAVENOUS | Status: DC | PRN
  Administered 2015-05-28: 50 mg via INTRAVENOUS

## 2015-05-28 MED ORDER — PROMETHAZINE HCL 25 MG/ML IJ SOLN
6.2500 mg | INTRAMUSCULAR | Status: DC | PRN
  Filled 2015-05-28: qty 1

## 2015-05-28 MED ORDER — LACTATED RINGERS IV SOLN
INTRAVENOUS | Status: DC
  Administered 2015-05-28 (×2): via INTRAVENOUS
  Filled 2015-05-28: qty 1000

## 2015-05-28 SURGICAL SUPPLY — 46 items
BANDAGE ELASTIC 3 VELCRO ST LF (GAUZE/BANDAGES/DRESSINGS) ×3 IMPLANT
BLADE SURG 15 STRL LF DISP TIS (BLADE) ×1 IMPLANT
BLADE SURG 15 STRL SS (BLADE) ×3
BNDG CMPR 9X4 STRL LF SNTH (GAUZE/BANDAGES/DRESSINGS) ×1
BNDG CMPR MD 5X2 ELC HKLP STRL (GAUZE/BANDAGES/DRESSINGS) ×1
BNDG CONFORM 2 STRL LF (GAUZE/BANDAGES/DRESSINGS) ×3 IMPLANT
BNDG CONFORM 3 STRL LF (GAUZE/BANDAGES/DRESSINGS) IMPLANT
BNDG ELASTIC 2 VLCR STRL LF (GAUZE/BANDAGES/DRESSINGS) ×3 IMPLANT
BNDG ESMARK 4X9 LF (GAUZE/BANDAGES/DRESSINGS) ×3 IMPLANT
CORDS BIPOLAR (ELECTRODE) ×3 IMPLANT
COVER BACK TABLE 60X90IN (DRAPES) ×3 IMPLANT
CUFF TOURNIQUET SINGLE 18IN (TOURNIQUET CUFF) ×3 IMPLANT
DRAPE EXTREMITY T 121X128X90 (DRAPE) ×3 IMPLANT
DRAPE LG THREE QUARTER DISP (DRAPES) ×3 IMPLANT
DRAPE SURG 17X23 STRL (DRAPES) ×3 IMPLANT
DRSG EMULSION OIL 3X3 NADH (GAUZE/BANDAGES/DRESSINGS) IMPLANT
GAUZE XEROFORM 1X8 LF (GAUZE/BANDAGES/DRESSINGS) ×3 IMPLANT
GLOVE BIO SURGEON STRL SZ 6.5 (GLOVE) ×2 IMPLANT
GLOVE BIO SURGEON STRL SZ8 (GLOVE) ×3 IMPLANT
GLOVE BIO SURGEONS STRL SZ 6.5 (GLOVE) ×1
GLOVE BIOGEL PI IND STRL 8.5 (GLOVE) ×1 IMPLANT
GLOVE BIOGEL PI INDICATOR 8.5 (GLOVE) ×2
GLOVE INDICATOR 6.5 STRL GRN (GLOVE) ×3 IMPLANT
GLOVE INDICATOR 7.5 STRL GRN (GLOVE) ×3 IMPLANT
GOWN STRL REUS W/ TWL LRG LVL3 (GOWN DISPOSABLE) ×2 IMPLANT
GOWN STRL REUS W/TWL LRG LVL3 (GOWN DISPOSABLE) ×9 IMPLANT
GOWN STRL REUS W/TWL XL LVL3 (GOWN DISPOSABLE) ×3 IMPLANT
KNIFE CARPAL TUNNEL (BLADE) IMPLANT
MANIFOLD NEPTUNE II (INSTRUMENTS) IMPLANT
NDL SAFETY ECLIPSE 18X1.5 (NEEDLE) IMPLANT
NEEDLE HYPO 18GX1.5 SHARP (NEEDLE)
NEEDLE HYPO 25X1 1.5 SAFETY (NEEDLE) ×6 IMPLANT
NS IRRIG 500ML POUR BTL (IV SOLUTION) IMPLANT
PACK BASIN DAY SURGERY FS (CUSTOM PROCEDURE TRAY) ×3 IMPLANT
PAD ALCOHOL SWAB (MISCELLANEOUS) ×12 IMPLANT
PAD CAST 3X4 CTTN HI CHSV (CAST SUPPLIES) IMPLANT
PADDING CAST COTTON 3X4 STRL (CAST SUPPLIES)
SPONGE GAUZE 4X4 12PLY STER LF (GAUZE/BANDAGES/DRESSINGS) ×3 IMPLANT
STOCKINETTE 4X48 STRL (DRAPES) ×3 IMPLANT
SUT PROLENE 4 0 PS 2 18 (SUTURE) ×3 IMPLANT
SYR BULB 3OZ (MISCELLANEOUS) ×3 IMPLANT
SYR CONTROL 10ML LL (SYRINGE) ×6 IMPLANT
TOWEL OR 17X24 6PK STRL BLUE (TOWEL DISPOSABLE) ×3 IMPLANT
TRAY DSU PREP LF (CUSTOM PROCEDURE TRAY) ×3 IMPLANT
UNDERPAD 30X30 INCONTINENT (UNDERPADS AND DIAPERS) ×3 IMPLANT
WATER STERILE IRR 500ML POUR (IV SOLUTION) ×3 IMPLANT

## 2015-05-28 NOTE — H&P (Signed)
Derrick [name] is an 69 y.o. male.   Chief Complaint: left hand numbness HPI: Pt followed in office Pt with persistent numbness and tingling in left hand Pt here for surgey on left hand No prior surgery to left han  Past Medical History  Diagnosis Date  . Hyperlipidemia   . COPD (chronic obstructive pulmonary disease)   . Diabetes mellitus   . Coronary atherosclerosis of native coronary vessel     S/P NSTEMI 04/11/2009--DEL stent to LAD.  Nuclear stress test 11/2010 NEG, EF normal  . Diabetic peripheral neuropathy     Painful; 1 vicodin bid very helpful (pt resistant to alternative tx's)  . Early cataracts, bilateral 04/24/12    [name] in [place], [place]  . Herpes zoster     Two separate outbreaks--one on left axillary/back region, one on right axillary/back region  . Chronic renal insufficiency, stage III (moderate) 2013    CrCl est high 50s (borderline stage II/III   . Mitral regurgitation 08/2013    Moderate (echo)    Past Surgical History  Procedure Laterality Date  . Spine surgery      L-Spine 2000, C-Spine 2004  . Cryotherapy to ak lesion on nose  2/11  . Colonoscopy  12/2010    Normal screening colonoscopy; rpt 10 yrs  . Eye exam,diabetic  02/2011    Normal diabetic retinopathy screening exam at [facility] in [place], [place].  . Cardiovascular stress test  11/2010    No ischemia/normal EF  . Coronary stent placement  04/2009    DES to LAD  . Transthoracic echocardiogram  08/2013    LVH, EF 55-65%, no WMA.  Moderate mitral regurg    Family History  Problem Relation Age of Onset  . Coronary artery disease      family hx of  . Cancer Mother     brain  . Diabetes Father   . Heart disease Father   . Diabetes Brother   . Heart disease Brother   . Diabetes Son   . Hyperlipidemia Son    Social History:  reports that he quit smoking about 4 years ago. His smoking use included Cigarettes. He has never used smokeless tobacco. He reports that he does not drink alcohol or  use illicit drugs.  Allergies:  Allergies  Allergen Reactions  . Atorvastatin     Muscle cramps   . Lovastatin     Muscle cramps  . Simvastatin Other (See Comments)    Muscle pain  . Pravastatin Other (See Comments)    Muscle cramps    Medications Prior to Admission  Medication Sig Dispense Refill  . aspirin 81 MG EC tablet Take 81 mg by mouth daily.      . Blood Glucose Monitoring Suppl (ONE TOUCH ULTRA SYSTEM KIT) W/DEVICE KIT Patient must check sugar TID 1 each 0  . HYDROcodone-acetaminophen (NORCO/VICODIN) 5-325 MG per tablet Take 1 tablet by mouth every 6 (six) hours as needed. 60 tablet 0  . Insulin Detemir (LEVEMIR FLEXTOUCH) 100 UNIT/ML Pen 24 U SQ qhs 15 mL 11  . insulin lispro (HUMALOG) 100 UNIT/ML injection Inject into the skin 2 (two) times daily with a meal.    . Insulin Pen Needle 32G X 4 MM MISC Use as direct to inject insulin. DX 250.00 100 each 6  . Lancets (ONETOUCH ULTRASOFT) lancets Use as instructed    . lisinopril (PRINIVIL,ZESTRIL) 2.5 MG tablet TAKE ONE TABLET BY MOUTH ONCE DAILY 90 tablet 1  .  omeprazole (PRILOSEC) 20 MG capsule TAKE ONE CAPSULE BY MOUTH IN THE MORNING 90 capsule 0  . ONE TOUCH ULTRA TEST test strip USE ONE STRIP TO CHECK GLUCOSE 4 TIMES DAILY AS NEEDED 100 each 12  . ONETOUCH DELICA LANCETS FINE MISC Use 1 Lancet To Check Glucose 4 Times Daily As Needed 100 each 12  . nitroGLYCERIN (NITROSTAT) 0.4 MG SL tablet Place 1 tablet (0.4 mg total) under the tongue every 5 (five) minutes as needed. 20 tablet 1    Results for orders placed or performed during the hospital encounter of 05/28/15 (from the past 48 hour(s))  I-STAT 4, (NA,K, GLUC, HGB,HCT)     Status: Abnormal   Collection Time: 05/28/15 12:48 PM  Result Value Ref Range   Sodium 139 135 - 145 mmol/L   Potassium 4.4 3.5 - 5.1 mmol/L   Glucose, Bld 176 (H) 65 - 99 mg/dL   HCT 41.0 39.0 - 52.0 %   Hemoglobin 13.9 13.0 - 17.0 g/dL   No results found.  ROSNO RECENT ILLNESSES OR  HOSPITALIZATIONS  Blood pressure 120/61, pulse 64, temperature 97.7 F (36.5 C), temperature source Oral, resp. rate 16, height $RemoveBe'5\' 6"'sOwVDpjXj$  (1.676 m), weight 60.782 kg (134 lb), SpO2 99 %. Physical Exam  General Appearance:  Alert, cooperative, no distress, appears stated age  Head:  Normocephalic, without obvious abnormality, atraumatic  Eyes:  Pupils equal, conjunctiva/corneas clear,         Throat: Lips, mucosa, and tongue normal; teeth and gums normal  Neck: No visible masses     Lungs:   respirations unlabored  Chest Wall:  No tenderness or deformity  Heart:  Regular rate and rhythm,  Abdomen:   Soft, non-tender,         Extremities: LEFT HAND: SKIN INTACT, ABLE TO PALMAR ABDUCT THUMB FINGERS WARM WELL PERFUSED GOOD WRIST AND DIGITAL MOBILITY  Pulses: 2+ and symmetric  Skin: Skin color, texture, turgor normal, no rashes or lesions     Neurologic: Normal   Assessment/Plan LEFT WRIST CARPAL TUNNEL SYNDROME  LEFT HAND CARPAL TUNNEL RELEASE  R/B/A DISCUSSED WITH PT IN OFFICE.  PT VOICED UNDERSTANDING OF PLAN CONSENT SIGNED DAY OF SURGERY PT SEEN AND EXAMINED PRIOR TO OPERATIVE PROCEDURE/DAY OF SURGERY SITE MARKED. QUESTIONS ANSWERED WILL GO HOME FOLLOWING SURGERY  WE ARE PLANNING SURGERY FOR YOUR UPPER EXTREMITY. THE RISKS AND BENEFITS OF SURGERY INCLUDE BUT NOT LIMITED TO BLEEDING INFECTION, DAMAGE TO NEARBY NERVES ARTERIES TENDONS, FAILURE OF SURGERY TO ACCOMPLISH ITS INTENDED GOALS, PERSISTENT SYMPTOMS AND NEED FOR FURTHER SURGICAL INTERVENTION. WITH THIS IN MIND WE WILL PROCEED. I HAVE DISCUSSED WITH THE PATIENT THE PRE AND POSTOPERATIVE REGIMEN AND THE DOS AND DON'TS. PT VOICED UNDERSTANDING AND INFORMED CONSENT SIGNED.  [name] 05/28/2015, 2:26 PM

## 2015-05-28 NOTE — Discharge Instructions (Signed)
KEEP BANDAGE CLEAN AND DRY °CALL OFFICE FOR F/U APPT [phone] IN 14 DAYS °KEEP HAND ELEVATED ABOVE HEART °OK TO APPLY ICE TO OPERATIVE AREA °CONTACT OFFICE IF ANY WORSENING PAIN OR CONCERNS.Call your surgeon if you experience:  ° °1.  Fever over 101.0. °2.  Inability to urinate. °3.  Nausea and/or vomiting. °4.  Extreme swelling or bruising at the surgical site. °5.  Continued bleeding from the incision. °6.  Increased pain, redness or drainage from the incision. °7.  Problems related to your pain medication. °8. Any change in color, movement and/or sensation °9. Any problems and/or concerns ° °Post Anesthesia Home Care Instructions ° °Activity: °Get plenty of rest for the remainder of the day. A responsible adult should stay with you for 24 hours following the procedure.  °For the next 24 hours, DO NOT: °-Drive a car °-Operate machinery °-Drink alcoholic beverages °-Take any medication unless instructed by your physician °-Make any legal decisions or sign important papers. ° °Meals: °Start with liquid foods such as gelatin or soup. Progress to regular foods as tolerated. Avoid greasy, spicy, heavy foods. If nausea and/or vomiting occur, drink only clear liquids until the nausea and/or vomiting subsides. Call your physician if vomiting continues. ° °Special Instructions/Symptoms: °Your throat may feel dry or sore from the anesthesia or the breathing tube placed in your throat during surgery. If this causes discomfort, gargle with warm salt water. The discomfort should disappear within 24 hours. ° °If you had a scopolamine patch placed behind your ear for the management of post- operative nausea and/or vomiting: ° °1. The medication in the patch is effective for 72 hours, after which it should be removed.  Wrap patch in a tissue and discard in the trash. Wash hands thoroughly with soap and water. °2. You may remove the patch earlier than 72 hours if you experience unpleasant side effects which may include dry mouth,  dizziness or visual disturbances. °3. Avoid touching the patch. Wash your hands with soap and water after contact with the patch. °  ° °

## 2015-05-28 NOTE — Anesthesia Preprocedure Evaluation (Addendum)
Anesthesia Evaluation  Patient identified by MRN, date of birth, ID band Patient awake    Reviewed: Allergy & Precautions, NPO status , Patient's Chart, lab work & pertinent test results  Airway Mallampati: II  TM Distance: >3 FB Neck ROM: Full    Dental no notable dental hx.    Pulmonary COPDformer smoker,  breath sounds clear to auscultation  Pulmonary exam normal       Cardiovascular Exercise Tolerance: Good hypertension, Pt. on medications + CAD and + Past MI Normal cardiovascular examRhythm:Regular Rate:Normal     Neuro/Psych  Neuromuscular disease negative psych ROS   GI/Hepatic negative GI ROS, Neg liver ROS,   Endo/Other  diabetes, Type 1, Insulin Dependent  Renal/GU Renal diseasenegative Renal ROS  negative genitourinary   Musculoskeletal negative musculoskeletal ROS (+)   Abdominal   Peds negative pediatric ROS (+)  Hematology  (+) anemia ,   Anesthesia Other Findings   Reproductive/Obstetrics negative OB ROS                            Anesthesia Physical Anesthesia Plan  ASA: III  Anesthesia Plan: MAC   Post-op Pain Management:    Induction: Intravenous  Airway Management Planned: Natural Airway  Additional Equipment:   Intra-op Plan:   Post-operative Plan:   Informed Consent: I have reviewed the patients History and Physical, chart, labs and discussed the procedure including the risks, benefits and alternatives for the proposed anesthesia with the patient or authorized representative who has indicated his/her understanding and acceptance.   Dental advisory given  Plan Discussed with: CRNA  Anesthesia Plan Comments:         Anesthesia Quick Evaluation

## 2015-05-28 NOTE — Transfer of Care (Signed)
Immediate Anesthesia Transfer of Care Note  Patient: Derrick [name]  Procedure(s) Performed: Procedure(s): LEFT HAND CARPAL TUNNEL RELEASE (Left)  Patient Location: PACU and Short Stay  Anesthesia Type:MAC  Level of Consciousness: awake, alert  and oriented  Airway & Oxygen Therapy: Patient Spontanous Breathing  Post-op Assessment: Report given to RN  Post vital signs: Reviewed and stable  Last Vitals:  Filed Vitals:   05/28/15 1215  BP: 120/61  Pulse: 64  Temp: 36.5 C  Resp: 16    Complications: No apparent anesthesia complications

## 2015-05-28 NOTE — Anesthesia Postprocedure Evaluation (Signed)
  Anesthesia Post-op Note  Patient: Derrick [name]  Procedure(s) Performed: Procedure(s) (LRB): LEFT HAND CARPAL TUNNEL RELEASE (Left)  Patient Location: PACU  Anesthesia Type: MAC  Level of Consciousness: awake and alert   Airway and Oxygen Therapy: Patient Spontanous Breathing  Post-op Pain: mild  Post-op Assessment: Post-op Vital signs reviewed, Patient's Cardiovascular Status Stable, Respiratory Function Stable, Patent Airway and No signs of Nausea or vomiting  Last Vitals:  Filed Vitals:   05/28/15 1645  BP: 101/63  Pulse: 58  Temp: 36.4 C  Resp:     Post-op Vital Signs: stable   Complications: No apparent anesthesia complications

## 2015-05-28 NOTE — Brief Op Note (Signed)
05/28/2015  2:28 PM  PATIENT:  Derrick [name]  69 y.o. male  PRE-OPERATIVE DIAGNOSIS:  left hand carpal tunnel syndrome  POST-OPERATIVE DIAGNOSIS:  * No post-op diagnosis entered *  PROCEDURE:  Procedure(s): LEFT HAND CARPAL TUNNEL RELEASE (Left)  SURGEON:  Surgeon(s) and Role:    * Bradly Bienenstock, MD - Primary  PHYSICIAN ASSISTANT:   ASSISTANTS: none   ANESTHESIA:   MAC  EBL:     BLOOD ADMINISTERED:none  DRAINS: none and Penrose drain in the none   LOCAL MEDICATIONS USED:  MARCAINE     SPECIMEN:  No Specimen  DISPOSITION OF SPECIMEN:  N/A  COUNTS:  YES  TOURNIQUET:    DICTATION: .Other Dictation: Dictation Number [id number]  PLAN OF CARE: Discharge to home after PACU  PATIENT DISPOSITION:  PACU - hemodynamically stable.   Delay start of Pharmacological VTE agent (>24hrs) due to surgical blood loss or risk of bleeding: not applicable

## 2015-05-29 ENCOUNTER — Encounter (HOSPITAL_BASED_OUTPATIENT_CLINIC_OR_DEPARTMENT_OTHER): Payer: Self-pay | Admitting: Orthopedic Surgery

## 2015-05-29 NOTE — Op Note (Signed)
NAME:  Derrick [name], Derrick [name] NO.:  [id number]  MEDICAL RECORD NO.:  [id number]  LOCATION:                               FACILITY:  [facility]  PHYSICIAN:  Sharma Covert IV, M.D.DATE OF BIRTH:  1946/01/02  DATE OF PROCEDURE:  05/28/2015 DATE OF DISCHARGE:                              OPERATIVE REPORT   PREOPERATIVE DIAGNOSIS:  Left hand severe carpal tunnel syndrome.  POSTOPERATIVE DIAGNOSIS:  Left hand severe carpal tunnel syndrome.  SURGICAL PROCEDURE:  Left hand carpal tunnel release.  ATTENDING PHYSICIAN:  Sharma Covert, MD, who scrubbed and present for the entire procedure.  ASSISTANT SURGEON:  None.  ANESTHESIA:  Xylocaine 1% and 0.25% Marcaine local block with IV sedation.  SURGICAL INDICATIONS:  The patient is a right-hand-dominant gentleman, who is followed in the office with severe carpal tunnel syndrome.  The patient has failed nonsurgical treatment and elected to undergo the above procedure.  Risks, benefits, and alternatives were discussed in detail with the patient and signed informed consent was obtained.  Risks include, but not limited to bleeding, infection, damage to nearby nerves, arteries or tendons, loss of motion to the wrist and digits, incomplete relief of symptoms, and need for further surgical intervention.  DESCRIPTION OF PROCEDURE:  The patient was properly identified in the preoperative holding area and a mark with a permanent marker made on the left hand to indicate the correct operative site.  The patient was then brought back to the operating room, placed supine on the anesthesia room table where the IV sedation was administered.  The patient tolerated this well.  Xylocaine 1% and 0.25% Marcaine local block was then performed.  The patient tolerated this well.  A well-padded tourniquet was placed in left brachium and sealed with 1000 drape.  Left upper extremity was then prepped and draped in normal sterile fashion.  Time- out  was called, correct side was identified, and procedure then begun. Attention then turned to left hand.  Limb was elevated and tourniquet insufflated.  Several cm incision made directly in mid palm.  Dissection was carried down through the skin and subcutaneous tissue.  The palmar fascia was incised longitudinally.  Deep dissection carried onto the transverse carpal ligament and under direct visualization, the distal one-half of the transverse carpal ligament released.  Further exposure was then carried out approximating first the transverse carpal ligament as well as the portion of the antebrachial fascia was then released. Contents of carpal canal were then inspected.  No other abnormalities noted.  The tourniquet was deflated.  Hemostasis was obtained.  Skin was then closed using horizontal mattress Prolene sutures.  Xeroform dressing and sterile compressive bandage were then applied.  The patient tolerated the procedure well and returned to the recovery room in good condition.  POSTPROCEDURAL PLAN:  The patient discharged to home, seen back in the office in approximately 2 weeks for wound check, suture removal, and Gel Flex Glove.  Begin postoperative carpal tunnel evaluation and treat.     Madelynn Done, M.D.     FWO/MEDQ  D:  05/28/2015  T:  05/28/2015  Job:  [id number]

## 2015-06-01 ENCOUNTER — Encounter: Payer: Self-pay | Admitting: Gastroenterology

## 2015-06-10 ENCOUNTER — Ambulatory Visit: Payer: Medicare Other | Admitting: Internal Medicine

## 2015-07-02 ENCOUNTER — Other Ambulatory Visit: Payer: Self-pay | Admitting: Family Medicine

## 2015-07-03 NOTE — Telephone Encounter (Signed)
RF request for omeprazole LOV: 03/09/15 Next ov: 09/08/15 Last written: 03/03/15 #90 w/ 0RF

## 2015-07-15 DIAGNOSIS — H524 Presbyopia: Secondary | ICD-10-CM | POA: Diagnosis not present

## 2015-07-15 DIAGNOSIS — H52222 Regular astigmatism, left eye: Secondary | ICD-10-CM | POA: Diagnosis not present

## 2015-07-15 DIAGNOSIS — E11329 Type 2 diabetes mellitus with mild nonproliferative diabetic retinopathy without macular edema: Secondary | ICD-10-CM | POA: Diagnosis not present

## 2015-09-08 ENCOUNTER — Ambulatory Visit: Payer: Medicare Other | Admitting: Family Medicine

## 2015-09-08 DIAGNOSIS — Z0289 Encounter for other administrative examinations: Secondary | ICD-10-CM

## 2015-09-17 ENCOUNTER — Other Ambulatory Visit: Payer: Self-pay | Admitting: Family Medicine

## 2015-09-17 DIAGNOSIS — G5601 Carpal tunnel syndrome, right upper limb: Secondary | ICD-10-CM | POA: Diagnosis not present

## 2015-09-17 DIAGNOSIS — Z4789 Encounter for other orthopedic aftercare: Secondary | ICD-10-CM | POA: Diagnosis not present

## 2015-09-17 DIAGNOSIS — G5602 Carpal tunnel syndrome, left upper limb: Secondary | ICD-10-CM | POA: Diagnosis not present

## 2015-09-17 NOTE — Telephone Encounter (Signed)
Pt is requesting a rx to be [facility] [phone], for his insulin, Hemolog and Levimeer vials.

## 2015-09-17 NOTE — Telephone Encounter (Signed)
LOV: 03/19/15 NOV: None  RF request for humalog Last written: unknown  RF request for levemir Last written: 04/01/14 15ml w/ 11RF  Tried to contact pt NA and unable to leave a message need to know how many units pt is taking of the Humalog.

## 2015-09-18 MED ORDER — INSULIN LISPRO 100 UNIT/ML ~~LOC~~ SOLN
5.0000 [IU] | Freq: Two times a day (BID) | SUBCUTANEOUS | Status: DC
Start: 2015-09-18 — End: 2015-11-17

## 2015-09-18 MED ORDER — INSULIN DETEMIR 100 UNIT/ML FLEXPEN: PEN_INJECTOR | SUBCUTANEOUS | Status: DC

## 2015-09-18 NOTE — Telephone Encounter (Signed)
Pt called back and states he uses between 5-10 units depending on need. His contact phone is [phone].

## 2015-09-18 NOTE — Telephone Encounter (Signed)
Rxs sent

## 2015-10-01 ENCOUNTER — Other Ambulatory Visit: Payer: Self-pay | Admitting: Family Medicine

## 2015-10-02 NOTE — Telephone Encounter (Signed)
RF request for lancets LOV: 03/09/15 Next ov: None Last written: 09/09/14 #100 w/ 12RF  Pt was referred to endocrinology but has cancelled first two apts then no showed last apt with Dr. Elvera LennoxGherghe. Please advise. Thanks.

## 2015-10-05 NOTE — Telephone Encounter (Signed)
Patient called in checking on lancets he had surgery on his hand has not been able to keep an appt with endocrinologist.

## 2015-10-08 ENCOUNTER — Other Ambulatory Visit: Payer: Self-pay | Admitting: *Deleted

## 2015-10-08 MED ORDER — HYDROCODONE-ACETAMINOPHEN 5-325 MG PO TABS: 1.0000 | ORAL_TABLET | Freq: Four times a day (QID) | ORAL | Status: DC | PRN

## 2015-10-08 NOTE — Telephone Encounter (Signed)
Pt LMOM on 10/08/15 at 8:53am requesting refill.   RF request for Hydro/APAP LOV: 03/09/15 - due for f/u Next ov: None - No showed apt on 09/08/15 Last written: 05/28/15 #30 w/ 0RF  Please advise.Thanks.

## 2015-10-08 NOTE — Telephone Encounter (Signed)
Will print rx for #30 vicodin, but pt must have f/u in office before any FURTHER RF's of this med.-thx

## 2015-10-09 NOTE — Telephone Encounter (Signed)
Left message for pt to call back  °

## 2015-10-09 NOTE — Telephone Encounter (Signed)
patient aware

## 2015-10-22 ENCOUNTER — Other Ambulatory Visit: Payer: Self-pay | Admitting: Family Medicine

## 2015-10-26 ENCOUNTER — Other Ambulatory Visit: Payer: Self-pay | Admitting: *Deleted

## 2015-10-26 NOTE — Telephone Encounter (Signed)
Opened in error

## 2015-11-17 ENCOUNTER — Telehealth: Payer: Self-pay | Admitting: Family Medicine

## 2015-11-17 MED ORDER — INSULIN LISPRO 100 UNIT/ML ~~LOC~~ SOLN: 5.0000 [IU] | Freq: Two times a day (BID) | SUBCUTANEOUS | Status: DC

## 2015-11-17 MED ORDER — GLUCOSE BLOOD VI STRP: ORAL_STRIP | Status: AC

## 2015-11-17 MED ORDER — INSULIN DETEMIR 100 UNIT/ML FLEXPEN: PEN_INJECTOR | SUBCUTANEOUS | Status: DC

## 2015-11-17 NOTE — Telephone Encounter (Signed)
Pt. Came in and needs Humalog, Velamer and One touch test strips as he left all his medications on the airplane today. He needs this for tonight.

## 2015-11-17 NOTE — Telephone Encounter (Signed)
[facility] Pt is due for ov. Pt advised and voiced understanding.

## 2015-12-03 DIAGNOSIS — Z794 Long term (current) use of insulin: Secondary | ICD-10-CM | POA: Diagnosis not present

## 2015-12-03 DIAGNOSIS — E1140 Type 2 diabetes mellitus with diabetic neuropathy, unspecified: Secondary | ICD-10-CM | POA: Diagnosis not present

## 2015-12-03 DIAGNOSIS — Z955 Presence of coronary angioplasty implant and graft: Secondary | ICD-10-CM | POA: Diagnosis not present

## 2015-12-03 DIAGNOSIS — I2510 Atherosclerotic heart disease of native coronary artery without angina pectoris: Secondary | ICD-10-CM | POA: Diagnosis not present

## 2015-12-03 DIAGNOSIS — I252 Old myocardial infarction: Secondary | ICD-10-CM | POA: Diagnosis not present

## 2015-12-03 DIAGNOSIS — Z1212 Encounter for screening for malignant neoplasm of rectum: Secondary | ICD-10-CM | POA: Diagnosis not present

## 2015-12-03 DIAGNOSIS — Z125 Encounter for screening for malignant neoplasm of prostate: Secondary | ICD-10-CM | POA: Diagnosis not present

## 2015-12-03 DIAGNOSIS — Z7982 Long term (current) use of aspirin: Secondary | ICD-10-CM | POA: Diagnosis not present

## 2015-12-03 DIAGNOSIS — Z0001 Encounter for general adult medical examination with abnormal findings: Secondary | ICD-10-CM | POA: Diagnosis not present

## 2015-12-15 DIAGNOSIS — E1140 Type 2 diabetes mellitus with diabetic neuropathy, unspecified: Secondary | ICD-10-CM | POA: Diagnosis not present

## 2015-12-21 DIAGNOSIS — E1140 Type 2 diabetes mellitus with diabetic neuropathy, unspecified: Secondary | ICD-10-CM | POA: Diagnosis not present

## 2015-12-25 ENCOUNTER — Other Ambulatory Visit: Payer: Self-pay | Admitting: Acute Care

## 2015-12-25 DIAGNOSIS — Z87891 Personal history of nicotine dependence: Secondary | ICD-10-CM

## 2015-12-28 DIAGNOSIS — E1140 Type 2 diabetes mellitus with diabetic neuropathy, unspecified: Secondary | ICD-10-CM | POA: Diagnosis not present

## 2016-01-05 ENCOUNTER — Telehealth: Payer: Self-pay | Admitting: Cardiovascular Disease

## 2016-01-05 DIAGNOSIS — Z0000 Encounter for general adult medical examination without abnormal findings: Secondary | ICD-10-CM | POA: Diagnosis not present

## 2016-01-05 NOTE — Telephone Encounter (Signed)
Received records from [facility] for appointment on 01/15/16 with Dr Allyson Sabal.  Records given to [facility] (medical records) for Dr Hazle Coca schedule on 01/15/16. lp

## 2016-01-11 ENCOUNTER — Inpatient Hospital Stay: Admission: RE | Admit: 2016-01-11 | Payer: Medicare Other | Source: Ambulatory Visit

## 2016-01-11 ENCOUNTER — Encounter: Payer: Medicare Other | Admitting: Acute Care

## 2016-01-15 ENCOUNTER — Ambulatory Visit: Payer: Medicare Other | Admitting: Cardiovascular Disease

## 2016-01-19 ENCOUNTER — Ambulatory Visit (INDEPENDENT_AMBULATORY_CARE_PROVIDER_SITE_OTHER): Payer: Medicare Other | Admitting: Acute Care

## 2016-01-19 ENCOUNTER — Encounter: Payer: Self-pay | Admitting: Acute Care

## 2016-01-19 ENCOUNTER — Ambulatory Visit
Admission: RE | Admit: 2016-01-19 | Discharge: 2016-01-19 | Disposition: A | Discharge status: Home / Self Care | Payer: Medicare Other | Source: Ambulatory Visit | Attending: Acute Care | Admitting: Acute Care

## 2016-01-19 DIAGNOSIS — Z87891 Personal history of nicotine dependence: Secondary | ICD-10-CM

## 2016-01-19 NOTE — Progress Notes (Signed)
Shared Decision Making Visit Lung Cancer Screening Program [phone])   Eligibility:  Age 70 y.o.  Pack Years Smoking History Calculation 50-pack-year smoking history (# packs/per year x # years smoked)  Recent History of coughing up blood  no  Unexplained weight loss? no ( >Than 15 pounds within the last 6 months )  Prior History Lung / other cancer no (Diagnosis within the last 5 years already requiring surveillance chest CT Scans).  Smoking Status Former Smoker  Former Smokers: Years since quit: 6 years  Quit Date: 01/06/2011  Visit Components:  Discussion included one or more decision making aids. yes  Discussion included risk/benefits of screening. yes  Discussion included potential follow up diagnostic testing for abnormal scans. yes  Discussion included meaning and risk of over diagnosis. yes  Discussion included meaning and risk of False Positives. yes  Discussion included meaning of total radiation exposure. yes  Counseling Included:  Importance of adherence to annual lung cancer LDCT screening. yes  Impact of comorbidities on ability to participate in the program. yes  Ability and willingness to under diagnostic treatment. yes  Smoking Cessation Counseling:  Current Smokers:   Discussed importance of smoking cessation. NA; former smoker  Information about tobacco cessation classes and interventions provided to patient. yes  Patient provided with "ticket" for LDCT Scan. yes  Symptomatic Patient. no  Counseling: Not applicable former smoker  Diagnosis Code: Tobacco Use Z72.0  Asymptomatic Patient yes  Counseling not applicable former smoker  Former Smokers:   Discussed the importance of maintaining cigarette abstinence. yes  Diagnosis Code: Personal History of Nicotine Dependence. U04.540  Information about tobacco cessation classes and interventions provided to patient. Yes  Patient provided with "ticket" for LDCT Scan. yes  Written Order  for Lung Cancer Screening with LDCT placed in Epic. Yes (CT Chest Lung Cancer Screening Low Dose W/O CM) [id number] Z12.2-Screening of respiratory organs Z87.891-Personal history of nicotine dependence  I spent 15 minutes of face to face time with Derrick [name] and his wife Derrick [name] discussing the risks and benefits of lung cancer screening. We viewed a power point together that explained in detail the above noted topics. We took the time to pause the power point at intervals to allow for questions to be asked and answered to ensure understanding. We discussed that he had taken the single most powerful action possible to decrease his risk of developing lung cancer when he quit smoking. I counseled him to remain smoke free, and to contact me if he ever had the desire to smoke again so that I can provide resources and tools to help support the effort to remain smoke free. We discussed the time and location of the scan, and that either [name], CMA or I will call with the results within  24-48 hours of receiving them. [name] has my card and contact information in the event he needs to speak with me, in addition to a copy of the power point we reviewed as a resource. He verbalized understanding of all of the above and had no further questions upon leaving the office.    Bevelyn Ngo, NP

## 2016-01-20 DIAGNOSIS — E119 Type 2 diabetes mellitus without complications: Secondary | ICD-10-CM | POA: Diagnosis not present

## 2016-02-09 ENCOUNTER — Encounter: Payer: Self-pay | Admitting: Cardiovascular Disease

## 2016-02-09 ENCOUNTER — Other Ambulatory Visit: Payer: Self-pay | Admitting: Acute Care

## 2016-02-09 ENCOUNTER — Ambulatory Visit (INDEPENDENT_AMBULATORY_CARE_PROVIDER_SITE_OTHER): Payer: Medicare Other | Admitting: Cardiovascular Disease

## 2016-02-09 VITALS — BP 106/72 | HR 68 | Ht 66.0 in | Wt 141.0 lb

## 2016-02-09 DIAGNOSIS — R079 Chest pain, unspecified: Secondary | ICD-10-CM

## 2016-02-09 DIAGNOSIS — I25119 Atherosclerotic heart disease of native coronary artery with unspecified angina pectoris: Secondary | ICD-10-CM

## 2016-02-09 DIAGNOSIS — I214 Non-ST elevation (NSTEMI) myocardial infarction: Secondary | ICD-10-CM | POA: Diagnosis not present

## 2016-02-09 DIAGNOSIS — Z87891 Personal history of nicotine dependence: Secondary | ICD-10-CM

## 2016-02-09 DIAGNOSIS — R011 Cardiac murmur, unspecified: Secondary | ICD-10-CM

## 2016-02-09 NOTE — Patient Instructions (Signed)
Medication Instructions:  Your physician recommends that you continue on your current medications as directed. Please refer to the Current Medication list given to you today.   Labwork: none  Testing/Procedures: Your physician has requested that you have en exercise stress myoview. For further information please visit [email or web address]. Please follow instruction sheet, as given.  Your physician has requested that you have an echocardiogram. Echocardiography is a painless test that uses sound waves to create images of your heart. It provides your doctor with information about the size and shape of your heart and how well your heart's chambers and valves are working. This procedure takes approximately one hour. There are no restrictions for this procedure.   Follow-Up: Your physician wants you to follow-up in: 12 months with Dr. Allyson SabalBerry. You will receive a reminder letter in the mail two months in advance. If you don't receive a letter, please call our office to schedule the follow-up appointment.   Any Other Special Instructions Will Be Listed Below (If Applicable).     If you need a refill on your cardiac medications before your next appointment, please call your pharmacy.

## 2016-02-09 NOTE — Assessment & Plan Note (Signed)
History of hyperlipidemia intolerant to statin therapy on a research drug followed by his PCP

## 2016-02-09 NOTE — Progress Notes (Signed)
02/09/2016 Derrick [name]   1945/12/10  [id number]  Primary Physician Tammi Sou, MD Primary Cardiologist: Lorretta Harp MD Renae Gloss   HPI:  Mr. Albus is a very pleasant 70 year old thin and fit-appearing married Caucasian male father of 38, grandfather to 3 grandchildren who is accompanied by his wife [name]. He was referred by Dr. Shelia Media for cardiovascular evaluation because of a prior history of coronary stenting. Factors include treated diabetes and hyperlipidemia intolerant to statin therapy. He smoked 45 pack years and quit at the time of his myocardial infarction in 2010. He does have a family history of heart disease with a father and brother both of whom had coronary artery bypass grafting. He'll myocardial infarction back in 2010 and had stenting performed by Dr. Olevia Perches. He does get occasional atypical chest pain every other month. There also is a question of moderate mitral regurgitation.   Current Outpatient Prescriptions  Medication Sig Dispense Refill  . aspirin 81 MG EC tablet Take 81 mg by mouth daily.      . Blood Glucose Monitoring Suppl (ONE TOUCH ULTRA SYSTEM KIT) W/DEVICE KIT Patient must check sugar TID 1 each 0  . glucose blood (ONE TOUCH ULTRA TEST) test strip USE ONE STRIP TO CHECK GLUCOSE 4 TIMES DAILY AS NEEDED 100 each 12  . HYDROcodone-acetaminophen (NORCO/VICODIN) 5-325 MG per tablet Take 1 tablet by mouth every 6 (six) hours as needed. 60 tablet 0  . HYDROcodone-acetaminophen (NORCO/VICODIN) 5-325 MG tablet Take 1 tablet by mouth every 6 (six) hours as needed for moderate pain. 30 tablet 0  . Insulin Detemir (LEVEMIR FLEXTOUCH) 100 UNIT/ML Pen 24 U SQ qhs 15 mL 6  . insulin lispro (HUMALOG) 100 UNIT/ML injection Inject 0.05-0.1 mLs (5-10 Units total) into the skin 2 (two) times daily with a meal. 10 mL 6  . Insulin Pen Needle 32G X 4 MM MISC Use as direct to inject insulin. DX 250.00 100 each 6  . Lancets (ONETOUCH ULTRASOFT) lancets  Use as instructed    . lisinopril (PRINIVIL,ZESTRIL) 2.5 MG tablet TAKE ONE TABLET BY MOUTH ONCE DAILY 90 tablet 0  . nitroGLYCERIN (NITROSTAT) 0.4 MG SL tablet Place 1 tablet (0.4 mg total) under the tongue every 5 (five) minutes as needed. 20 tablet 1  . omeprazole (PRILOSEC) 20 MG capsule TAKE ONE CAPSULE BY MOUTH ONCE DAILY IN THE MORNING 90 capsule 3  . ONETOUCH DELICA LANCETS 25E MISC USE ONE  4 TIMES DAILY AS NEEDED 100 each 0   No current facility-administered medications for this visit.    Allergies  Allergen Reactions  . Atorvastatin     Muscle cramps   . Lovastatin     Muscle cramps  . Simvastatin Other (See Comments)    Muscle pain  . Pravastatin Other (See Comments)    Muscle cramps    Social History   Social History  . Marital Status: Married    Spouse Name: N/A  . Number of Children: N/A  . Years of Education: N/A   Occupational History  . Not on file.   Social History Main Topics  . Smoking status: Former Smoker -- 1.00 packs/day for 50 years    Types: Cigarettes    Quit date: 01/06/2011  . Smokeless tobacco: Never Used     Comment: Counseled to remain smoke free  . Alcohol Use: No  . Drug Use: No  . Sexual Activity: No   Other Topics Concern  . Not on file   Social  History Narrative   Married, works part time at [employer] in [place], [place].   Former smoker: quit 01/2011.  No alcohol or drugs.   No formal exercise.     Review of Systems: General: negative for chills, fever, night sweats or weight changes.  Cardiovascular: negative for chest pain, dyspnea on exertion, edema, orthopnea, palpitations, paroxysmal nocturnal dyspnea or shortness of breath Dermatological: negative for rash Respiratory: negative for cough or wheezing Urologic: negative for hematuria Abdominal: negative for nausea, vomiting, diarrhea, bright red blood per rectum, melena, or hematemesis Neurologic: negative for visual changes, syncope, or dizziness All other  systems reviewed and are otherwise negative except as noted above.    Blood pressure 106/72, pulse 68, height _0  (1.676 m), weight 141 lb (63.957 kg).  General appearance: alert and no distress Neck: no adenopathy, no carotid bruit, no JVD, supple, symmetrical, trachea midline and thyroid not enlarged, symmetric, no tenderness/mass/nodules Lungs: clear to auscultation bilaterally Heart: regular rate and rhythm, S1, S2 normal, no murmur, click, rub or gallop Extremities: extremities normal, atraumatic, no cyanosis or edema  EKG sinus rhythm at 68 with nonspecific ST and T-wave changes. I personally reviewed this EKG  ASSESSMENT AND PLAN:   HYPERLIPIDEMIA History of hyperlipidemia intolerant to statin therapy on a research drug followed by his PCP  Acute myocardial infarction, subendocardial infarction [facility]) History of CAD status post myocardial infarction back in 2010 treated with PCI and stenting by Dr. Olevia Perches. He does get occasional substernal chest pain. I'm going to get an exercise Myoview stress test to rule out ischemic etiology.  Systolic murmur History of moderate MR last assessed in 2014. We will recheck a 2-D echocardiogram      Lorretta Harp MD [facility], [facility] 02/09/2016 12:11 PM

## 2016-02-09 NOTE — Assessment & Plan Note (Signed)
History of CAD status post myocardial infarction back in 2010 treated with PCI and stenting by Dr. Juanda ChanceBrodie. He does get occasional substernal chest pain. I'm going to get an exercise Myoview stress test to rule out ischemic etiology.

## 2016-02-09 NOTE — Assessment & Plan Note (Signed)
History of moderate MR last assessed in 2014. We will recheck a 2-D echocardiogram

## 2016-02-18 ENCOUNTER — Telehealth (HOSPITAL_COMMUNITY): Payer: Self-pay

## 2016-02-18 NOTE — Addendum Note (Signed)
Addended by: Lindell SparELKINS, JENNA M on: 02/18/2016 04:21 PM   Modules accepted: Orders

## 2016-02-18 NOTE — Telephone Encounter (Signed)
Encounter complete. 

## 2016-02-23 ENCOUNTER — Ambulatory Visit (HOSPITAL_COMMUNITY)
Admission: RE | Admit: 2016-02-23 | Discharge: 2016-02-23 | Disposition: A | Discharge status: Home / Self Care | Payer: Medicare Other | Source: Ambulatory Visit | Attending: Cardiovascular Disease | Admitting: Cardiovascular Disease

## 2016-02-23 ENCOUNTER — Telehealth: Payer: Self-pay

## 2016-02-23 ENCOUNTER — Encounter (HOSPITAL_COMMUNITY): Payer: Self-pay | Admitting: *Deleted

## 2016-02-23 ENCOUNTER — Ambulatory Visit: Payer: Medicare Other | Admitting: Cardiovascular Disease

## 2016-02-23 ENCOUNTER — Ambulatory Visit (HOSPITAL_BASED_OUTPATIENT_CLINIC_OR_DEPARTMENT_OTHER)
Admission: RE | Admit: 2016-02-23 | Discharge: 2016-02-23 | Disposition: A | Discharge status: Home / Self Care | Payer: Medicare Other | Source: Ambulatory Visit | Attending: Cardiovascular Disease | Admitting: Cardiovascular Disease

## 2016-02-23 DIAGNOSIS — I25119 Atherosclerotic heart disease of native coronary artery with unspecified angina pectoris: Secondary | ICD-10-CM

## 2016-02-23 DIAGNOSIS — I340 Nonrheumatic mitral (valve) insufficiency: Secondary | ICD-10-CM | POA: Diagnosis not present

## 2016-02-23 DIAGNOSIS — Z87891 Personal history of nicotine dependence: Secondary | ICD-10-CM | POA: Insufficient documentation

## 2016-02-23 DIAGNOSIS — R9439 Abnormal result of other cardiovascular function study: Secondary | ICD-10-CM | POA: Insufficient documentation

## 2016-02-23 DIAGNOSIS — R0609 Other forms of dyspnea: Secondary | ICD-10-CM | POA: Insufficient documentation

## 2016-02-23 DIAGNOSIS — Z8249 Family history of ischemic heart disease and other diseases of the circulatory system: Secondary | ICD-10-CM | POA: Diagnosis not present

## 2016-02-23 DIAGNOSIS — E119 Type 2 diabetes mellitus without complications: Secondary | ICD-10-CM | POA: Insufficient documentation

## 2016-02-23 DIAGNOSIS — I119 Hypertensive heart disease without heart failure: Secondary | ICD-10-CM | POA: Diagnosis not present

## 2016-02-23 DIAGNOSIS — E785 Hyperlipidemia, unspecified: Secondary | ICD-10-CM | POA: Diagnosis not present

## 2016-02-23 DIAGNOSIS — R079 Chest pain, unspecified: Secondary | ICD-10-CM

## 2016-02-23 DIAGNOSIS — R0602 Shortness of breath: Secondary | ICD-10-CM | POA: Diagnosis not present

## 2016-02-23 LAB — MYOCARDIAL PERFUSION IMAGING
CHL RATE OF PERCEIVED EXERTION: 16
CSEPED: 6 min
CSEPEW: 7 METS
CSEPPHR: 133 {beats}/min
LV dias vol: 93 mL (ref 62–150)
LVSYSVOL: 45 mL
MPHR: 150 {beats}/min
Percent HR: 88 %
Rest HR: 62 {beats}/min
SDS: 8
SRS: 1
SSS: 9
TID: 1.06

## 2016-02-23 LAB — ECHOCARDIOGRAM COMPLETE
Height: 66 in
Weight: 2256 oz

## 2016-02-23 MED ORDER — METOPROLOL TARTRATE 25 MG PO TABS: 25.0000 mg | ORAL_TABLET | Freq: Two times a day (BID) | ORAL | Status: DC

## 2016-02-23 MED ORDER — TECHNETIUM TC 99M SESTAMIBI GENERIC - CARDIOLITE
10.1000 | Freq: Once | INTRAVENOUS | Status: AC | PRN
  Administered 2016-02-23: 10.1 via INTRAVENOUS

## 2016-02-23 MED ORDER — TECHNETIUM TC 99M SESTAMIBI GENERIC - CARDIOLITE
31.0000 | Freq: Once | INTRAVENOUS | Status: AC | PRN
  Administered 2016-02-23: 31 via INTRAVENOUS

## 2016-02-23 NOTE — Telephone Encounter (Signed)
Pt in office today for for Myoview and Echo. During Myoview abnormality noticed. reviewed with DOD, Tresa EndoKelly. Added Lopressor 25 mg BID to pt medication and next available with Allyson SabalBerry. Pt offered appt today 3/21 but unable to do and next available given on 4/5 @ 3:45pm with Dr. Allyson SabalBerry. Pt educated on medication and RX sent to preferred pharmacy.

## 2016-02-23 NOTE — Progress Notes (Signed)
Dr Tresa EndoKelly reviewed Cardiolite study and EKG's. Patient has an f/up appt with Dr Allyson SabalBerry on March 09, 2016 at 3:45 pm. Per Dr Tresa EndoKelly, pt was also instructed to begin taking Metoprolol 25 mg two times per day. Rx was called into pt's pharmacy by Dr Hazle CocaBerry's nurse Selena BattenKim.Derrick [name], Derrick [name]

## 2016-03-03 DIAGNOSIS — E1140 Type 2 diabetes mellitus with diabetic neuropathy, unspecified: Secondary | ICD-10-CM | POA: Diagnosis not present

## 2016-03-09 ENCOUNTER — Ambulatory Visit (INDEPENDENT_AMBULATORY_CARE_PROVIDER_SITE_OTHER): Payer: Medicare Other | Admitting: Cardiovascular Disease

## 2016-03-09 ENCOUNTER — Encounter: Payer: Self-pay | Admitting: Cardiovascular Disease

## 2016-03-09 VITALS — BP 122/78 | HR 65 | Ht 66.0 in | Wt 140.4 lb

## 2016-03-09 DIAGNOSIS — Z01818 Encounter for other preprocedural examination: Secondary | ICD-10-CM

## 2016-03-09 DIAGNOSIS — R931 Abnormal findings on diagnostic imaging of heart and coronary circulation: Secondary | ICD-10-CM | POA: Diagnosis not present

## 2016-03-09 DIAGNOSIS — R9439 Abnormal result of other cardiovascular function study: Secondary | ICD-10-CM

## 2016-03-09 NOTE — Assessment & Plan Note (Signed)
History of CAD status post PCI and stenting in 2010 the setting of a myocardial infarction by Dr. Dickie LaBrody. He was having some atypical chest pain every other month. A 2-D echo revealed normal LV systolic function with no evidence of valvular abnormalities. Myoview stress test showed an abnormal GXT with inferior perfusion and reality in the circumflex territory. Based on this we decided to proceed with outpatient radial diagnostic coronary arteriography. I have reviewed the risks, indications, and alternatives to cardiac catheterization, possible angioplasty, and stenting with the patient. Risks include but are not limited to bleeding, infection, vascular injury, stroke, myocardial infection, arrhythmia, kidney injury, radiation-related injury in the case of prolonged fluoroscopy use, emergency cardiac surgery, and death. The patient understands the risks of serious complication is 1-2 in 1000 with diagnostic cardiac cath and 1-2% or less with angioplasty/stenting.

## 2016-03-09 NOTE — Patient Instructions (Signed)
Medication Instructions:  Your physician recommends that you continue on your current medications as directed. Please refer to the Current Medication list given to you today.   Labwork: Your physician recommends that you return for lab work ON 4/17 OR WEEK OF. The lab can be found on the FIRST FLOOR of out building in [address]   Testing/Procedures: Your physician has requested that you have a cardiac catheterization. Cardiac catheterization is used to diagnose and/or treat various heart conditions. Doctors may recommend this procedure for a number of different reasons. The most common reason is to evaluate chest pain. Chest pain can be a symptom of coronary artery disease (CAD), and cardiac catheterization can show whether plaque is narrowing or blocking your heart's arteries. This procedure is also used to evaluate the valves, as well as measure the blood flow and oxygen levels in different parts of your heart. For further information please visit [email or web address]. FOR April 24  Following your catheterization, you will not be allowed to drive for 3 days.  No lifting, pushing, or pulling greater that 10 pounds is allowed for 1 week.  You will be required to have the following tests prior to the procedure:  1. Blood work-the blood work can be done no more than 7 days prior to the procedure.  It can be done at any [facility] lab.  There is one downstairs on the first floor of this building and one in the [facility] building [phone]([address], [address]).  2. Chest Xray-the chest [facility]. ANYTIME   Puncture site RADIAL     Any Other Special Instructions Will Be Listed Below (If Applicable).     If you need a refill on your cardiac medications before your next appointment, please call your pharmacy.

## 2016-03-09 NOTE — Progress Notes (Signed)
Derrick [name] returns today for follow-up of his outpatient noninvasive procedures. 2-D echo was normal. Myoview stress test was read as high risk with a positive GXT and a inferolateral perfusion [place] suggesting ischemia in the circumflex territory. Based on this we decided to proceed with outpatient radial cardiac catheterization. 

## 2016-03-10 ENCOUNTER — Encounter: Payer: Self-pay | Admitting: Cardiovascular Disease

## 2016-03-23 ENCOUNTER — Ambulatory Visit
Admission: RE | Admit: 2016-03-23 | Discharge: 2016-03-23 | Disposition: A | Discharge status: Home / Self Care | Payer: Medicare Other | Source: Ambulatory Visit | Attending: Cardiovascular Disease | Admitting: Cardiovascular Disease

## 2016-03-23 DIAGNOSIS — R9439 Abnormal result of other cardiovascular function study: Secondary | ICD-10-CM

## 2016-03-23 DIAGNOSIS — Z01818 Encounter for other preprocedural examination: Secondary | ICD-10-CM

## 2016-03-23 DIAGNOSIS — R931 Abnormal findings on diagnostic imaging of heart and coronary circulation: Secondary | ICD-10-CM | POA: Diagnosis not present

## 2016-03-23 LAB — CBC WITH DIFFERENTIAL/PLATELET
BASOS ABS: 58 {cells}/uL (ref 0–200)
Basophils Relative: 1 %
EOS PCT: 3 %
Eosinophils Absolute: 174 cells/uL (ref 15–500)
HCT: 41.8 % (ref 38.5–50.0)
Hemoglobin: 13.7 g/dL (ref 13.2–17.1)
Lymphocytes Relative: 26 %
Lymphs Abs: 1508 cells/uL (ref 850–3900)
MCH: 29.2 pg (ref 27.0–33.0)
MCHC: 32.8 g/dL (ref 32.0–36.0)
MCV: 89.1 fL (ref 80.0–100.0)
MONOS PCT: 7 %
MPV: 10.6 fL (ref 7.5–12.5)
Monocytes Absolute: 406 cells/uL (ref 200–950)
NEUTROS PCT: 63 %
Neutro Abs: 3654 cells/uL (ref 1500–7800)
PLATELETS: 248 10*3/uL (ref 140–400)
RBC: 4.69 MIL/uL (ref 4.20–5.80)
RDW: 14.4 % (ref 11.0–15.0)
WBC: 5.8 10*3/uL (ref 3.8–10.8)

## 2016-03-24 LAB — TSH: TSH: 3.88 m[IU]/L (ref 0.40–4.50)

## 2016-03-24 LAB — APTT: aPTT: 27 seconds (ref 24–37)

## 2016-03-24 LAB — PROTIME-INR
INR: 0.98 (ref <1.50)
Prothrombin Time: 13.1 seconds (ref 11.6–15.2)

## 2016-03-25 ENCOUNTER — Other Ambulatory Visit: Payer: Self-pay

## 2016-03-25 DIAGNOSIS — R9439 Abnormal result of other cardiovascular function study: Secondary | ICD-10-CM

## 2016-03-28 ENCOUNTER — Encounter (HOSPITAL_COMMUNITY)
Admission: RE | Disposition: A | Discharge status: Home / Self Care | Payer: Self-pay | Source: Ambulatory Visit | Attending: Cardiovascular Disease

## 2016-03-28 ENCOUNTER — Telehealth: Payer: Self-pay | Admitting: Cardiovascular Disease

## 2016-03-28 ENCOUNTER — Ambulatory Visit (HOSPITAL_COMMUNITY)
Admission: RE | Admit: 2016-03-28 | Discharge: 2016-03-28 | Disposition: A | Discharge status: Home / Self Care | Payer: Medicare Other | Source: Ambulatory Visit | Attending: Cardiovascular Disease | Admitting: Cardiovascular Disease

## 2016-03-28 ENCOUNTER — Encounter (HOSPITAL_COMMUNITY): Payer: Self-pay | Admitting: Cardiovascular Disease

## 2016-03-28 DIAGNOSIS — Z8249 Family history of ischemic heart disease and other diseases of the circulatory system: Secondary | ICD-10-CM | POA: Insufficient documentation

## 2016-03-28 DIAGNOSIS — R9439 Abnormal result of other cardiovascular function study: Secondary | ICD-10-CM | POA: Insufficient documentation

## 2016-03-28 DIAGNOSIS — I739 Peripheral vascular disease, unspecified: Secondary | ICD-10-CM | POA: Insufficient documentation

## 2016-03-28 DIAGNOSIS — I214 Non-ST elevation (NSTEMI) myocardial infarction: Secondary | ICD-10-CM | POA: Diagnosis present

## 2016-03-28 DIAGNOSIS — I2510 Atherosclerotic heart disease of native coronary artery without angina pectoris: Secondary | ICD-10-CM | POA: Diagnosis not present

## 2016-03-28 HISTORY — PX: PERIPHERAL VASCULAR CATHETERIZATION: SHX172C

## 2016-03-28 HISTORY — PX: CARDIAC CATHETERIZATION: SHX172

## 2016-03-28 LAB — GLUCOSE, CAPILLARY
GLUCOSE-CAPILLARY: 158 mg/dL — AB (ref 65–99)
GLUCOSE-CAPILLARY: 126 mg/dL — AB (ref 65–99)

## 2016-03-28 LAB — BASIC METABOLIC PANEL
Anion gap: 12 (ref 5–15)
BUN: 11 mg/dL (ref 6–20)
CHLORIDE: 104 mmol/L (ref 101–111)
CO2: 23 mmol/L (ref 22–32)
CREATININE: 1.53 mg/dL — AB (ref 0.61–1.24)
Calcium: 8.5 mg/dL — ABNORMAL LOW (ref 8.9–10.3)
GFR calc Af Amer: 51 mL/min — ABNORMAL LOW (ref >60)
GFR, EST NON AFRICAN AMERICAN: 44 mL/min — AB (ref >60)
Glucose, Bld: 158 mg/dL — ABNORMAL HIGH (ref 65–99)
POTASSIUM: 4.2 mmol/L (ref 3.5–5.1)
SODIUM: 139 mmol/L (ref 135–145)

## 2016-03-28 SURGERY — LEFT HEART CATH AND CORONARY ANGIOGRAPHY
Anesthesia: LOCAL

## 2016-03-28 MED ORDER — ASPIRIN 81 MG PO CHEW: 81.0000 mg | CHEWABLE_TABLET | ORAL | Status: DC

## 2016-03-28 MED ORDER — SODIUM CHLORIDE 0.9 % WEIGHT BASED INFUSION: 1.0000 mL/kg/h | INTRAVENOUS | Status: DC

## 2016-03-28 MED ORDER — HEPARIN SODIUM (PORCINE) 1000 UNIT/ML IJ SOLN
INTRAMUSCULAR | Status: AC
  Filled 2016-03-28: qty 1

## 2016-03-28 MED ORDER — SODIUM CHLORIDE 0.9% FLUSH: 3.0000 mL | Freq: Two times a day (BID) | INTRAVENOUS | Status: DC

## 2016-03-28 MED ORDER — NITROGLYCERIN 1 MG/10 ML FOR IR/CATH LAB
INTRA_ARTERIAL | Status: DC | PRN
  Administered 2016-03-28: 200 ug via INTRA_ARTERIAL

## 2016-03-28 MED ORDER — MIDAZOLAM HCL 2 MG/2ML IJ SOLN
INTRAMUSCULAR | Status: DC | PRN
  Administered 2016-03-28: 1 mg via INTRAVENOUS

## 2016-03-28 MED ORDER — SODIUM CHLORIDE 0.9 % IV SOLN: INTRAVENOUS | Status: AC

## 2016-03-28 MED ORDER — ASPIRIN 81 MG PO CHEW: 81.0000 mg | CHEWABLE_TABLET | Freq: Every day | ORAL | Status: DC

## 2016-03-28 MED ORDER — VERAPAMIL HCL 2.5 MG/ML IV SOLN
INTRAVENOUS | Status: AC
  Filled 2016-03-28: qty 2

## 2016-03-28 MED ORDER — MORPHINE SULFATE (PF) 2 MG/ML IV SOLN: 2.0000 mg | INTRAVENOUS | Status: DC | PRN

## 2016-03-28 MED ORDER — LIDOCAINE HCL (PF) 1 % IJ SOLN
INTRAMUSCULAR | Status: DC | PRN
  Administered 2016-03-28: 2 mL via SUBCUTANEOUS
  Administered 2016-03-28: 20 mL via SUBCUTANEOUS

## 2016-03-28 MED ORDER — HEPARIN (PORCINE) IN NACL 2-0.9 UNIT/ML-% IJ SOLN
INTRAMUSCULAR | Status: AC
  Filled 2016-03-28: qty 1000

## 2016-03-28 MED ORDER — ACETAMINOPHEN 325 MG PO TABS: 650.0000 mg | ORAL_TABLET | ORAL | Status: DC | PRN

## 2016-03-28 MED ORDER — SODIUM CHLORIDE 0.9 % WEIGHT BASED INFUSION
3.0000 mL/kg/h | INTRAVENOUS | Status: DC
  Administered 2016-03-28: 3 mL/kg/h via INTRAVENOUS

## 2016-03-28 MED ORDER — ONDANSETRON HCL 4 MG/2ML IJ SOLN: 4.0000 mg | Freq: Four times a day (QID) | INTRAMUSCULAR | Status: DC | PRN

## 2016-03-28 MED ORDER — LIDOCAINE HCL (PF) 1 % IJ SOLN
INTRAMUSCULAR | Status: AC
  Filled 2016-03-28: qty 30

## 2016-03-28 MED ORDER — IOPAMIDOL (ISOVUE-370) INJECTION 76%
INTRAVENOUS | Status: AC
  Filled 2016-03-28: qty 100

## 2016-03-28 MED ORDER — SODIUM CHLORIDE 0.9% FLUSH: 3.0000 mL | INTRAVENOUS | Status: DC | PRN

## 2016-03-28 MED ORDER — NITROGLYCERIN 1 MG/10 ML FOR IR/CATH LAB
INTRA_ARTERIAL | Status: AC
  Filled 2016-03-28: qty 10

## 2016-03-28 MED ORDER — FENTANYL CITRATE (PF) 100 MCG/2ML IJ SOLN
INTRAMUSCULAR | Status: DC | PRN
  Administered 2016-03-28: 25 ug via INTRAVENOUS

## 2016-03-28 MED ORDER — SODIUM CHLORIDE 0.9 % IV SOLN: 250.0000 mL | INTRAVENOUS | Status: DC | PRN

## 2016-03-28 MED ORDER — MIDAZOLAM HCL 2 MG/2ML IJ SOLN
INTRAMUSCULAR | Status: AC
  Filled 2016-03-28: qty 2

## 2016-03-28 MED ORDER — HEPARIN (PORCINE) IN NACL 2-0.9 UNIT/ML-% IJ SOLN
INTRAMUSCULAR | Status: DC | PRN
  Administered 2016-03-28: 1000 mL

## 2016-03-28 MED ORDER — FENTANYL CITRATE (PF) 100 MCG/2ML IJ SOLN
INTRAMUSCULAR | Status: AC
  Filled 2016-03-28: qty 2

## 2016-03-28 MED ORDER — IOPAMIDOL (ISOVUE-370) INJECTION 76%
INTRAVENOUS | Status: DC | PRN
  Administered 2016-03-28: 60 mL via INTRAVENOUS

## 2016-03-28 SURGICAL SUPPLY — 11 items
CATH INFINITI 5FR MULTPACK ANG (CATHETERS) ×2 IMPLANT
CATH STRAIGHT 5FR 65CM (CATHETERS) ×2 IMPLANT
DRSG TEGADERM 6X6 (GAUZE/BANDAGES/DRESSINGS) ×2 IMPLANT
GLIDESHEATH SLEND A-KIT 6F 22G (SHEATH) ×2 IMPLANT
KIT HEART LEFT (KITS) ×2 IMPLANT
PACK CARDIAC CATHETERIZATION (CUSTOM PROCEDURE TRAY) ×2 IMPLANT
SHEATH PINNACLE 5F 10CM (SHEATH) ×2 IMPLANT
SYR MEDRAD MARK V 150ML (SYRINGE) ×2 IMPLANT
TRANSDUCER W/STOPCOCK (MISCELLANEOUS) ×2 IMPLANT
TUBING CIL FLEX 10 FLL-RA (TUBING) ×2 IMPLANT
WIRE HI TORQ VERSACORE-J 145CM (WIRE) ×2 IMPLANT

## 2016-03-28 NOTE — H&P (View-Only) (Signed)
Derrick [name] returns today for follow-up of his outpatient noninvasive procedures. 2-D echo was normal. Myoview stress test was read as high risk with a positive GXT and a inferolateral perfusion [place] suggesting ischemia in the circumflex territory. Based on this we decided to proceed with outpatient radial cardiac catheterization.

## 2016-03-28 NOTE — Progress Notes (Signed)
Site area: lt groin Site Prior to Removal:  Level 0 Pressure Applied For:  20 minutes Manual:   yes Patient Status During Pull:  stable Post Pull Site:  Level  0 Post Pull Instructions Given:  yes Post Pull Pulses Present: yes Dressing Applied:  tegaderm Bedrest begins @  0930 Comments:

## 2016-03-28 NOTE — Discharge Instructions (Signed)
Angiogram, Care After °Refer to this sheet in the next few weeks. These instructions provide you with information about caring for yourself after your procedure. Your health care provider may also give you more specific instructions. Your treatment has been planned according to current medical practices, but problems sometimes occur. Call your health care provider if you have any problems or questions after your procedure. °WHAT TO EXPECT AFTER THE PROCEDURE °After your procedure, it is typical to have the following: °· Bruising at the catheter insertion site that usually fades within 1-2 weeks. °· Blood collecting in the tissue (hematoma) that may be painful to the touch. It should usually decrease in size and tenderness within 1-2 weeks. °HOME CARE INSTRUCTIONS °· Take medicines only as directed by your health care provider. °· You may shower 24-48 hours after the procedure or as directed by your health care provider. Remove the bandage (dressing) and gently wash the site with plain soap and water. Pat the area dry with a clean towel. Do not rub the site, because this may cause bleeding. °· Do not take baths, swim, or use a hot tub until your health care provider approves. °· Check your insertion site every day for redness, swelling, or drainage. °· Do not apply powder or lotion to the site. °· Do not lift over 10 lb (4.5 kg) for 5 days after your procedure or as directed by your health care provider. °· Ask your health care provider when it is okay to: °¨ Return to work or school. °¨ Resume usual physical activities or sports. °¨ Resume sexual activity. °· Do not drive home if you are discharged the same day as the procedure. Have someone else drive you. °· You may drive 24 hours after the procedure unless otherwise instructed by your health care provider. °· Do not operate machinery or power tools for 24 hours after the procedure or as directed by your health care provider. °· If your procedure was done as an  outpatient procedure, which means that you went home the same day as your procedure, a responsible adult should be with you for the first 24 hours after you arrive home. °· Keep all follow-up visits as directed by your health care provider. This is important. °SEEK MEDICAL CARE IF: °· You have a fever. °· You have chills. °· You have increased bleeding from the catheter insertion site. Hold pressure on the site. °SEEK IMMEDIATE MEDICAL CARE IF: °· You have unusual pain at the catheter insertion site. °· You have redness, warmth, or swelling at the catheter insertion site. °· You have drainage (other than a small amount of blood on the dressing) from the catheter insertion site. °· The catheter insertion site is bleeding, and the bleeding does not stop after 30 minutes of holding steady pressure on the site. °· The area near or just beyond the catheter insertion site becomes pale, cool, tingly, or numb. °  °This information is not intended to replace advice given to you by your health care provider. Make sure you discuss any questions you have with your health care provider. °  °Document Released: 06/09/2005 Document Revised: 12/12/2014 Document Reviewed: 04/24/2013 °Elsevier Interactive Patient Education ©2016 [employer] ° °

## 2016-03-28 NOTE — Interval H&P Note (Signed)
Cath Lab Visit (complete for each Cath Lab visit)  Clinical Evaluation Leading to the Procedure:   ACS: No.  Non-ACS:    Anginal Classification: CCS II  Anti-ischemic medical therapy: No Therapy  Non-Invasive Test Results: Intermediate-risk stress test findings: cardiac mortality 1-3%/year  Prior CABG: No previous CABG      History and Physical Interval Note:  03/28/2016 7:57 AM  Donley RedderJerry E Burdette  has presented today for surgery, with the diagnosis of abn stress test  The various methods of treatment have been discussed with the patient and family. After consideration of risks, benefits and other options for treatment, the patient has consented to  Procedure(s): Left Heart Cath and Coronary Angiography (N/A) as a surgical intervention .  The patient's history has been reviewed, patient examined, no change in status, stable for surgery.  I have reviewed the patient's chart and labs.  Questions were answered to the patient's satisfaction.     Nanetta BattyBerry, Shawniece Oyola

## 2016-03-29 DIAGNOSIS — E1140 Type 2 diabetes mellitus with diabetic neuropathy, unspecified: Secondary | ICD-10-CM | POA: Diagnosis not present

## 2016-03-29 MED FILL — Verapamil HCl IV Soln 2.5 MG/ML: INTRAVENOUS | Qty: 2 | Status: AC

## 2016-03-29 MED FILL — Heparin Sodium (Porcine) Inj 1000 Unit/ML: INTRAMUSCULAR | Qty: 10 | Status: AC

## 2016-03-31 NOTE — Telephone Encounter (Signed)
Closed Encounter  °

## 2016-04-05 ENCOUNTER — Telehealth: Payer: Self-pay | Admitting: Cardiovascular Disease

## 2016-04-05 ENCOUNTER — Encounter: Payer: Self-pay | Admitting: Cardiovascular Disease

## 2016-04-05 ENCOUNTER — Ambulatory Visit (INDEPENDENT_AMBULATORY_CARE_PROVIDER_SITE_OTHER): Payer: Medicare Other | Admitting: Cardiovascular Disease

## 2016-04-05 ENCOUNTER — Other Ambulatory Visit: Payer: Self-pay | Admitting: Cardiovascular Disease

## 2016-04-05 VITALS — BP 116/72 | HR 58 | Ht 66.0 in | Wt 140.0 lb

## 2016-04-05 DIAGNOSIS — I739 Peripheral vascular disease, unspecified: Secondary | ICD-10-CM | POA: Diagnosis not present

## 2016-04-05 DIAGNOSIS — I779 Disorder of arteries and arterioles, unspecified: Secondary | ICD-10-CM

## 2016-04-05 DIAGNOSIS — Z01818 Encounter for other preprocedural examination: Secondary | ICD-10-CM

## 2016-04-05 DIAGNOSIS — I214 Non-ST elevation (NSTEMI) myocardial infarction: Secondary | ICD-10-CM

## 2016-04-05 DIAGNOSIS — I25119 Atherosclerotic heart disease of native coronary artery with unspecified angina pectoris: Secondary | ICD-10-CM

## 2016-04-05 NOTE — Patient Instructions (Signed)
Medication Instructions:  Your physician recommends that you continue on your current medications as directed. Please refer to the Current Medication list given to you today.   Labwork: none  Testing/Procedures: Your physician has requested that you have a carotid duplex. This test is an ultrasound of the carotid arteries in your neck. It looks at blood flow through these arteries that supply the brain with blood. Allow one hour for this exam. There are no restrictions or special instructions.  Your physician has requested that you have a lower extremity arterial doppler- During this test, ultrasound is used to evaluate arterial blood flow in the legs. Allow approximately one hour for this exam.   Your physician has requested that you have an abdominal/Illiac duplex. During this test, an ultrasound is used to evaluate the aorta. Allow 30 minutes for this exam. Do not eat after midnight the day before and avoid carbonated beverages    Follow-Up: Your physician recommends that you schedule a follow-up appointment in: 2 months with Dr. Allyson SabalBerry.   Any Other Special Instructions Will Be Listed Below (If Applicable).     If you need a refill on your cardiac medications before your next appointment, please call your pharmacy.

## 2016-04-05 NOTE — Assessment & Plan Note (Signed)
Derrick [name] had a cardiac catheterization performed by myself 03/28/16 with demonstration of a patent proximal LAD stent, 95% ostial circumflex stenosis, 70% mid AV groove circumflex stenosis and long 70% dominant RCA stenosis with catheter damping. This was in the setting of a positive Myoview stress test. I believe the best revascularization strategy would be coronary artery bypass grafting. The patient apparently has a appointment to see Derrick [name] at [facility] tomorrow. I'm going to get carotid and lower extremity arterial Doppler studies.

## 2016-04-05 NOTE — Progress Notes (Signed)
04/05/2016 Derrick [name]   1946/07/29  [id number]  Primary Physician Londell Moh, MD Primary Cardiologist: Runell Gess MD Roseanne Reno   HPI:  Derrick [name] is a very pleasant 71 year old thin and fit-appearing married Caucasian male father of 3, grandfather to 3 grandchildren who is accompanied by his wife Derrick [name]. I last saw Derrick [name] in the office 03/09/16.He was referred by Derrick [name] for cardiovascular evaluation because of a prior history of coronary stenting. Factors include treated diabetes and hyperlipidemia intolerant to statin therapy. He smoked 45 pack years and quit at the time of his myocardial infarction in 2010. He does have a family history of heart disease with a father and brother both of whom had coronary artery bypass grafting. He'll myocardial infarction back in 2010 and had stenting performed by Derrick [name]. He does get occasional atypical chest pain every other month. There also is a question of moderate mitral regurgitation. A 2-D echocardiogram showed normal LV function with mild MR. The Myoview stress test which was read as intermediate risk with inferior and lateral ischemia. Based on this, I proceed with outpatient cardiac catheterization 03/28/16 demonstrating a patent proximal LAD stent, 95% ostial circumflex stenosis, 70% mid AV groove circumflex stenosis and diffuse 70% proximal mid and distal dominant RCA stenosis. I be the best form of revascularization would be coronary artery bypass grafting. I also demonstrated an occluded right common iliac artery and high-grade left proximal common iliac artery stenosis.  Current Outpatient Prescriptions  Medication Sig Dispense Refill  . aspirin 81 MG EC tablet Take 81 mg by mouth daily.      . Blood Glucose Monitoring Suppl (ONE TOUCH ULTRA SYSTEM KIT) W/DEVICE KIT Patient must check sugar TID 1 each 0  . glucose blood (ONE TOUCH ULTRA TEST) test strip USE ONE STRIP TO CHECK GLUCOSE 4 TIMES DAILY AS NEEDED  100 each 12  . HYDROcodone-acetaminophen (NORCO/VICODIN) 5-325 MG tablet Take 1 tablet by mouth every 6 (six) hours as needed for moderate pain. 30 tablet 0  . Insulin Human (INSULIN PUMP) SOLN Inject 1 each into the skin continuous. 2.7 small, 5.5 medium, 8.5 large meal settings    . Insulin Pen Needle 32G X 4 MM MISC Use as direct to inject insulin. DX 250.00 100 each 6  . Lancets (ONETOUCH ULTRASOFT) lancets Use as instructed    . lisinopril (PRINIVIL,ZESTRIL) 2.5 MG tablet TAKE ONE TABLET BY MOUTH ONCE DAILY 90 tablet 0  . metoprolol tartrate (LOPRESSOR) 25 MG tablet Take 1 tablet (25 mg total) by mouth 2 (two) times daily. 90 tablet 1  . nitroGLYCERIN (NITROSTAT) 0.4 MG SL tablet Place 1 tablet (0.4 mg total) under the tongue every 5 (five) minutes as needed. (Patient taking differently: Place 0.4 mg under the tongue every 5 (five) minutes as needed for chest pain. ) 20 tablet 1  . omeprazole (PRILOSEC) 20 MG capsule TAKE ONE CAPSULE BY MOUTH ONCE DAILY IN THE MORNING (Patient taking differently: TAKE ONE CAPSULE (20 mg) BY MOUTH ONCE DAILY IN THE MORNING) 90 capsule 3  . ONETOUCH DELICA LANCETS 33G MISC USE ONE  4 TIMES DAILY AS NEEDED 100 each 0  . research study medication Take 1 each by mouth every morning. Study drug for cholesterol from Derrick [name]     No current facility-administered medications for this visit.    Allergies  Allergen Reactions  . Atorvastatin     Muscle cramps   . Lovastatin     Muscle cramps  .  Simvastatin Other (See Comments)    Muscle pain  . Pravastatin Other (See Comments)    Muscle cramps    Social History   Social History  . Marital Status: Married    Spouse Name: N/A  . Number of Children: N/A  . Years of Education: N/A   Occupational History  . Not on file.   Social History Main Topics  . Smoking status: Former Smoker -- 1.00 packs/day for 50 years    Types: Cigarettes    Quit date: 01/06/2011  . Smokeless tobacco: Never Used      Comment: Counseled to remain smoke free  . Alcohol Use: No  . Drug Use: No  . Sexual Activity: No   Other Topics Concern  . Not on file   Social History Narrative   Married, works part time at [employer] in [place], [place].   Former smoker: quit 01/2011.  No alcohol or drugs.   No formal exercise.     Review of Systems: General: negative for chills, fever, night sweats or weight changes.  Cardiovascular: negative for chest pain, dyspnea on exertion, edema, orthopnea, palpitations, paroxysmal nocturnal dyspnea or shortness of breath Dermatological: negative for rash Respiratory: negative for cough or wheezing Urologic: negative for hematuria Abdominal: negative for nausea, vomiting, diarrhea, bright red blood per rectum, melena, or hematemesis Neurologic: negative for visual changes, syncope, or dizziness All other systems reviewed and are otherwise negative except as noted above.    Blood pressure 116/72, pulse 58, height '5\' 6"'$  (1.676 m), weight 140 lb (63.504 kg).  General appearance: alert and no distress Neck: no adenopathy, no carotid bruit, no JVD, supple, symmetrical, trachea midline and thyroid not enlarged, symmetric, no tenderness/mass/nodules Lungs: clear to auscultation bilaterally Heart: regular rate and rhythm, S1, S2 normal, no murmur, click, rub or gallop Extremities: extremities normal, atraumatic, no cyanosis or edema  EKG not performed today  ASSESSMENT AND PLAN:   Acute myocardial infarction, subendocardial infarction [facility]) Derrick [name] had a cardiac catheterization performed by myself 03/28/16 with demonstration of a patent proximal LAD stent, 95% ostial circumflex stenosis, 70% mid AV groove circumflex stenosis and long 70% dominant RCA stenosis with catheter damping. This was in the setting of a positive Myoview stress test. I believe the best revascularization strategy would be coronary artery bypass grafting. The patient apparently has a appointment  to see Dr. Cyndia Bent at [facility] tomorrow. I'm going to get carotid and lower extremity arterial Doppler studies.  Claudication [facility]) Derrick [name] had angiographically demonstrated occlusion of his right common iliac artery with high-grade proximal left common iliac artery stenosis. There was a 60 mm pullback gradient after intra-articular dilution was administered via the SideArm sheath. He complains of right hip claudication and left calf claudication. His peripheral arterial disease will be further evaluated and treated once he has had his coronaries revascularized.      Lorretta Harp MD FACP,FACC,FAHA, [facility] 04/05/2016 10:54 AM

## 2016-04-05 NOTE — Telephone Encounter (Signed)
Called the patient and gave him the three different tests scheduled [facility].  Patient is aware of dates, times and locations.

## 2016-04-05 NOTE — Assessment & Plan Note (Signed)
Derrick [name] had angiographically demonstrated occlusion of his right common iliac artery with high-grade proximal left common iliac artery stenosis. There was a 60 mm pullback gradient after intra-articular dilution was administered via the SideArm sheath. He complains of right hip claudication and left calf claudication. His peripheral arterial disease will be further evaluated and treated once he has had his coronaries revascularized.

## 2016-04-06 ENCOUNTER — Institutional Professional Consult (permissible substitution) (INDEPENDENT_AMBULATORY_CARE_PROVIDER_SITE_OTHER): Payer: Medicare Other | Admitting: Surgery

## 2016-04-06 ENCOUNTER — Telehealth: Payer: Self-pay | Admitting: Cardiovascular Disease

## 2016-04-06 ENCOUNTER — Encounter: Payer: Self-pay | Admitting: Surgery

## 2016-04-06 ENCOUNTER — Other Ambulatory Visit: Payer: Self-pay | Admitting: *Deleted

## 2016-04-06 ENCOUNTER — Inpatient Hospital Stay (HOSPITAL_COMMUNITY): Admission: RE | Admit: 2016-04-06 | Payer: Medicare Other | Source: Ambulatory Visit

## 2016-04-06 VITALS — BP 129/70 | HR 62 | Resp 16 | Ht 66.0 in | Wt 140.0 lb

## 2016-04-06 DIAGNOSIS — I2510 Atherosclerotic heart disease of native coronary artery without angina pectoris: Secondary | ICD-10-CM

## 2016-04-06 NOTE — Telephone Encounter (Signed)
Derrick [name] is calling because he is calling to see if the test that are schedule for him on tomorrow , Friday and Tuesday be cancel because the hospital called and they will do the test before the surgery on 04/21/16.[name]. Please call   Thanks

## 2016-04-06 NOTE — Telephone Encounter (Signed)
Spoke with pt and wife. They needed to cancel appt for LEA, carotids, and aorta/lilliac/ivc testing as the Carotids are happending at hosptial on 04/19/16 as preoperative for CABG scheduled on 04/21/16. Pt going to call back middle of June to discuss getting LEa and aorta/illiac/IVC duplex done to evaluate hip and leg claudication.

## 2016-04-07 ENCOUNTER — Encounter: Payer: Self-pay | Admitting: Surgery

## 2016-04-07 ENCOUNTER — Inpatient Hospital Stay (HOSPITAL_COMMUNITY): Admission: RE | Admit: 2016-04-07 | Payer: Medicare Other | Source: Ambulatory Visit

## 2016-04-07 NOTE — Progress Notes (Signed)
Cardiothoracic Surgery Consultation   PCP is Horatio Pel, MD Referring Provider is Lorretta Harp, MD  Chief Complaint  Patient presents with  . Coronary Artery Disease    eval for CABG...CATH 03/28/16, STRESS and ECHO 02/23/16    HPI:  The patient is a 70 year old gentleman with a long history of diabetes, hyperlipidemia intolerant to statin therapy, strong family history of coronary artery disease and remote smoking until 2010 when he suffered an MI and had stenting of his proximal LAD by Dr. Olevia Perches. He was recently evaluated by Dr. Gwenlyn Found for a history of occasional chest pain with exertion. He had a stress Myoview exam that was intermediate risk with inferior and lateral ischemia. Cath on 03/28/2016 showed severe multi-vessel CAD with 95% ostial LCX stenosis, 70% mid LCX stenosis, 70% proximal and mid RCA stenosis and a patent stent in the proximal LAD with mild luminal irregularity within the stent but no significant stenosis. He says that he has not had any chest pain in several weeks.   Past Medical History  Diagnosis Date  . Hyperlipidemia   . COPD (chronic obstructive pulmonary disease) ([place])   . Diabetes mellitus   . Coronary atherosclerosis of native coronary vessel     S/P NSTEMI 04/11/2009--DEL stent to LAD.  Nuclear stress test 11/2010 NEG, EF normal  . Diabetic peripheral neuropathy (HCC)     Painful; 1 vicodin bid very helpful (pt resistant to alternative tx's)  . Early cataracts, bilateral 04/24/12    [name] in [place], [place]  . Herpes zoster     Two separate outbreaks--one on left axillary/back region, one on right axillary/back region  . Chronic renal insufficiency, stage III (moderate) 2013    CrCl est high 50s (borderline stage II/III   . Mitral regurgitation 08/2013    Moderate (echo)  . Peripheral arterial disease [facility])     Past Surgical History  Procedure Laterality Date  . Spine surgery      L-Spine 2000, C-Spine 2004  . Cryotherapy  to ak lesion on nose  2/11  . Colonoscopy  12/2010    Normal screening colonoscopy; rpt 10 yrs  . Eye exam,diabetic  02/2011    Normal diabetic retinopathy screening exam at [facility] in [place], .  . Cardiovascular stress test  11/2010    No ischemia/normal EF  . Coronary stent placement  04/2009    DES to LAD  . Transthoracic echocardiogram  08/2013    LVH, EF 55-65%, no WMA.  Moderate mitral regurg  . Carpal tunnel release Left 05/28/2015    Procedure: LEFT HAND CARPAL TUNNEL RELEASE;  Surgeon: Iran Planas, MD;  Location: [facility];  Service: Orthopedics;  Laterality: Left;  . Cardiac catheterization N/A 03/28/2016    Procedure: Left Heart Cath and Coronary Angiography;  Surgeon: Lorretta Harp, MD;  Location: [facility];  Service: Cardiovascular;  Laterality: N/A;  . Peripheral vascular catheterization N/A 03/28/2016    Procedure: Abdominal Aortogram;  Surgeon: Lorretta Harp, MD;  Location: [facility];  Service: Cardiovascular;  Laterality: N/A;    Family History  Problem Relation Age of Onset  . Coronary artery disease      family hx of  . Cancer Mother     brain  . Diabetes Father   . Heart disease Father   . Diabetes Brother   . Heart disease Brother   . Diabetes Son   . Hyperlipidemia Son  Social History Social History  Substance Use Topics  . Smoking status: Former Smoker -- 1.00 packs/day for 50 years    Types: Cigarettes    Quit date: 01/06/2011  . Smokeless tobacco: Never Used     Comment: Counseled to remain smoke free  . Alcohol Use: No    Current Outpatient Prescriptions  Medication Sig Dispense Refill  . aspirin 81 MG EC tablet Take 81 mg by mouth daily.      . Blood Glucose Monitoring Suppl (ONE TOUCH ULTRA SYSTEM KIT) W/DEVICE KIT Patient must check sugar TID 1 each 0  . glucose blood (ONE TOUCH ULTRA TEST) test strip USE ONE STRIP TO CHECK GLUCOSE 4 TIMES DAILY AS NEEDED 100 each 12  . HYDROcodone-acetaminophen  (NORCO/VICODIN) 5-325 MG tablet Take 1 tablet by mouth every 6 (six) hours as needed for moderate pain. 30 tablet 0  . Insulin Human (INSULIN PUMP) SOLN Inject 1 each into the skin continuous. 2.7 small, 5.5 medium, 8.5 large meal settings    . lisinopril (PRINIVIL,ZESTRIL) 2.5 MG tablet TAKE ONE TABLET BY MOUTH ONCE DAILY 90 tablet 0  . metoprolol tartrate (LOPRESSOR) 25 MG tablet Take 1 tablet (25 mg total) by mouth 2 (two) times daily. 90 tablet 1  . nitroGLYCERIN (NITROSTAT) 0.4 MG SL tablet Place 1 tablet (0.4 mg total) under the tongue every 5 (five) minutes as needed. (Patient taking differently: Place 0.4 mg under the tongue every 5 (five) minutes as needed for chest pain. ) 20 tablet 1  . omeprazole (PRILOSEC) 20 MG capsule TAKE ONE CAPSULE BY MOUTH ONCE DAILY IN THE MORNING (Patient taking differently: TAKE ONE CAPSULE (20 mg) BY MOUTH ONCE DAILY IN THE MORNING) 90 capsule 3  . ONETOUCH DELICA LANCETS 48J MISC USE ONE  4 TIMES DAILY AS NEEDED 100 each 0  . research study medication Take 1 each by mouth every morning. Study drug for cholesterol from Dr. Deland Pretty     No current facility-administered medications for this visit.    Allergies  Allergen Reactions  . Atorvastatin     Muscle cramps   . Lovastatin     Muscle cramps  . Simvastatin Other (See Comments)    Muscle pain  . Pravastatin Other (See Comments)    Muscle cramps    Review of Systems  Constitutional: Positive for activity change and fatigue.  HENT: Negative.        Dentures  Eyes:       Glasses  Respiratory: Negative for shortness of breath.   Cardiovascular: Positive for chest pain.  Gastrointestinal: Negative.   Endocrine: Negative.   Genitourinary: Negative.   Musculoskeletal: Negative.   Skin: Negative.   Allergic/Immunologic: Negative.   Neurological: Negative.   Hematological: Negative.   Psychiatric/Behavioral: Negative.     BP 129/70 mmHg  Pulse 62  Resp 16  Ht '5\' 6"'$  (1.676 m)  Wt 140  lb (63.504 kg)  BMI 22.61 kg/m2  SpO2 97% Physical Exam  Constitutional: He is oriented to person, place, and time. He appears well-developed and well-nourished. No distress.  HENT:  Head: Normocephalic and atraumatic.  Mouth/Throat: Oropharynx is clear and moist.  Eyes: EOM are normal. Pupils are equal, round, and reactive to light.  Neck: Normal range of motion. Neck supple. No thyromegaly present.  Cardiovascular: Normal rate, regular rhythm, normal heart sounds and intact distal pulses.   No murmur heard. Pulmonary/Chest: Effort normal and breath sounds normal. No respiratory distress.  Abdominal: Soft. Bowel sounds are normal. He  exhibits no distension and no mass. There is no tenderness.  Musculoskeletal: Normal range of motion. He exhibits no edema.  Lymphadenopathy:    He has no cervical adenopathy.  Neurological: He is alert and oriented to person, place, and time. He has normal strength. No cranial nerve deficit or sensory deficit.  Skin: Skin is warm and dry.  Psychiatric: He has a normal mood and affect.     Diagnostic Tests:  Lorretta Harp, MD (Primary)      Procedures    Abdominal Aortogram   Left Heart Cath and Coronary Angiography    Conclusion     Prox RCA to Mid RCA lesion, 70% stenosed.  Ost LAD to Prox LAD lesion, 10% stenosed. The lesion was previously treated with a stent (unknown type).  Prox Cx to Mid Cx lesion, 70% stenosed.  Ost Cx lesion, 95% stenosed.  ATHARVA MIRSKY is a 70 y.o. male   [id number] LOCATION: FACILITY: [facility]  PHYSICIAN: Quay Burow, M.D. [birth year]   DATE OF PROCEDURE: 03/28/2016  DATE OF DISCHARGE:     CARDIAC CATHETERIZATION     History obtained from chart review. Mr. Blakely is a very pleasant 70 year old thin and fit-appearing married Caucasian male father of 11, grandfather to 3 grandchildren who is accompanied by his wife [name]. He was referred by Dr. Shelia Media for cardiovascular evaluation because  of a prior history of coronary stenting. Factors include treated diabetes and hyperlipidemia intolerant to statin therapy. He smoked 45 pack years and quit at the time of his myocardial infarction in 2010. He does have a family history of heart disease with a father and brother both of whom had coronary artery bypass grafting. He'll myocardial infarction back in 2010 and had stenting performed by Dr. Olevia Perches. He does get occasional atypical chest pain every other month. There also is a question of moderate mitral regurgitation. A 2-D echocardiogram showed normal LV function with mild MR. The Myoview stress test which was read as intermediate risk with inferior and lateral ischemia. Based on this, the patient was referred for cardiac catheterization to define his anatomy. It should also be noted the patient has been having right hip pain thought to be arthritic for many years.     IMPRESSION:Mr. Amis has a patent proximal LAD stent. He is a 95% true ostial circumflex stenosis with a 70% AV groove stenosis and a long proximal and mid segmental 70% RCA stenosis The catheter didn't damp when the RCA ostium was engaged. It addition, he has a proximal total right common iliac artery occlusion reconstituting in the right common femoral artery and a 60% proximal left common iliac artery stenosis with a 60 mm pullback gradient using a 5 [place] internal catheter after administering administering 200 mg of intra-arterial nitroglycerin via the SideArm sheath. This is physiologically significant. I believe his coronary disease is best treated with coronary artery bypass grafting given the ostial nature of his circumflex stenosis. His symptoms are fairly infrequent. I'm going to discharge him home as outpatient and will have him evaluated by the [facility] for consideration of CABG. I will address his peripheral arterial disease in a staged fashion after his coronary arteries are revascularized. This was removed and pressure  held on the groin to achieve hemostasis. The patient left the lab in stable condition.  Quay Burow. MD, [facility] 03/28/2016 8:56 AM        Indications    Coronary artery disease due to lipid rich plaque [I25.10 (ICD-10-CM)]   Abnormal nuclear stress  test [R93.1 (ICD-10-CM)]   Claudication ([place]) [I73.9 (ICD-10-CM)]    Technique and Indications    PROCEDURE DESCRIPTION:   The patient was brought to the second floor [facility] cath lab in the postabsorptive state. He was  premedicated with valium 5 mg by mouth, IV Versed and fentanyl. His left groinwas prepped and shaved in usual sterile fashion. Xylocaine 1% was used for local anesthesia. A 5 French sheath was inserted into the eft common femoral artery using standard Seldinger technique. 5 French right and left Judkins diagnostic catheters as well as a 5 French pigtail catheter were used for selective coronary angiography, subselective left internal mammary artery angiography and distal abdominal aortography.Left heart pressures were also obtained. Isovue dye was used for the entirety of the case (60 mL to the patient). Retrograde aortic, left ventricular and pullback pressures were recorded.Estimated blood loss <50 mL. There were no immediate complications during the procedure.    Coronary Findings    Dominance: Right   Left Anterior Descending   . Ost LAD to Prox LAD lesion, 10% stenosed. The lesion was previously treated with a stent (unknown type).     Left Circumflex   . Ost Cx lesion, 95% stenosed.   . Prox Cx to Mid Cx lesion, 70% stenosed.     Right Coronary Artery   . Prox RCA to Mid RCA lesion, 70% stenosed.      Coronary Diagrams    Diagnostic Diagram            Implants     No implant documentation for this case.    PACS Images    Show images for Cardiac catheterization     Link to Procedure Log    Procedure Log      Hemo Data       Most Recent Value   AO Systolic  Pressure  454 mmHg   AO Diastolic Pressure  57 mmHg   AO Mean  83 mmHg   LV Systolic Pressure  098 mmHg   LV Diastolic Pressure  4 mmHg   LV EDP  12 mmHg   Arterial Occlusion Pressure Extended Systolic Pressure  119 mmHg   Arterial Occlusion Pressure Extended Diastolic Pressure  59 mmHg   Arterial Occlusion Pressure Extended Mean Pressure  85 mmHg   Left Ventricular Apex Extended Systolic Pressure  147 mmHg   Left Ventricular Apex Extended Diastolic Pressure  5 mmHg   Left Ventricular Apex Extended EDP Pressure  18 mmHg       Impression:  He has severe multi-vessel coronary artery disease with a 95% ostial LCX lesion that is likely the culprit for his chest pain. I agree that CABG is the best treatment for this gentleman. His LAD has widely patent proximal stents and does not require grafting. I discussed the operative procedure with the patient and his wife including alternatives, benefits and risks; including but not limited to bleeding, blood transfusion, infection, stroke, myocardial infarction, graft failure, heart block requiring a permanent pacemaker, organ dysfunction, and death.  [name] understands and agrees to proceed.     Plan:  CABG on 04/21/2016  Gaye Pollack, MD [facility] [phone]

## 2016-04-08 ENCOUNTER — Encounter (HOSPITAL_COMMUNITY): Payer: Medicare Other

## 2016-04-12 ENCOUNTER — Encounter (HOSPITAL_COMMUNITY): Payer: Medicare Other

## 2016-04-19 ENCOUNTER — Encounter (HOSPITAL_COMMUNITY): Payer: Self-pay

## 2016-04-19 ENCOUNTER — Ambulatory Visit (HOSPITAL_COMMUNITY)
Admission: RE | Admit: 2016-04-19 | Discharge: 2016-04-19 | Disposition: A | Discharge status: Home / Self Care | Payer: Medicare Other | Source: Ambulatory Visit | Attending: Surgery | Admitting: Surgery

## 2016-04-19 ENCOUNTER — Ambulatory Visit (HOSPITAL_COMMUNITY)
Admission: RE | Admit: 2016-04-19 | Discharge: 2016-04-19 | Disposition: A | Discharge status: Home / Self Care | Payer: Medicare Other | Source: Ambulatory Visit | Attending: Anesthesiology | Admitting: Anesthesiology

## 2016-04-19 ENCOUNTER — Ambulatory Visit (HOSPITAL_BASED_OUTPATIENT_CLINIC_OR_DEPARTMENT_OTHER)
Admission: RE | Admit: 2016-04-19 | Discharge: 2016-04-19 | Disposition: A | Discharge status: Home / Self Care | Payer: Medicare Other | Source: Ambulatory Visit | Attending: Surgery | Admitting: Surgery

## 2016-04-19 ENCOUNTER — Encounter (HOSPITAL_COMMUNITY)
Admission: RE | Admit: 2016-04-19 | Discharge: 2016-04-19 | Disposition: A | Discharge status: Home / Self Care | Payer: Medicare Other | Source: Ambulatory Visit | Attending: Surgery | Admitting: Surgery

## 2016-04-19 VITALS — BP 118/66 | HR 63 | Temp 97.7°F | Resp 18 | Ht 66.0 in | Wt 142.4 lb

## 2016-04-19 DIAGNOSIS — I2583 Coronary atherosclerosis due to lipid rich plaque: Secondary | ICD-10-CM | POA: Diagnosis not present

## 2016-04-19 DIAGNOSIS — E1142 Type 2 diabetes mellitus with diabetic polyneuropathy: Secondary | ICD-10-CM

## 2016-04-19 DIAGNOSIS — I739 Peripheral vascular disease, unspecified: Secondary | ICD-10-CM

## 2016-04-19 DIAGNOSIS — Z0183 Encounter for blood typing: Secondary | ICD-10-CM | POA: Insufficient documentation

## 2016-04-19 DIAGNOSIS — J449 Chronic obstructive pulmonary disease, unspecified: Secondary | ICD-10-CM

## 2016-04-19 DIAGNOSIS — R110 Nausea: Secondary | ICD-10-CM | POA: Diagnosis not present

## 2016-04-19 DIAGNOSIS — Z888 Allergy status to other drugs, medicaments and biological substances status: Secondary | ICD-10-CM | POA: Diagnosis not present

## 2016-04-19 DIAGNOSIS — I340 Nonrheumatic mitral (valve) insufficiency: Secondary | ICD-10-CM | POA: Diagnosis not present

## 2016-04-19 DIAGNOSIS — I129 Hypertensive chronic kidney disease with stage 1 through stage 4 chronic kidney disease, or unspecified chronic kidney disease: Secondary | ICD-10-CM

## 2016-04-19 DIAGNOSIS — Z01812 Encounter for preprocedural laboratory examination: Secondary | ICD-10-CM | POA: Insufficient documentation

## 2016-04-19 DIAGNOSIS — Z794 Long term (current) use of insulin: Secondary | ICD-10-CM | POA: Diagnosis not present

## 2016-04-19 DIAGNOSIS — I2510 Atherosclerotic heart disease of native coronary artery without angina pectoris: Secondary | ICD-10-CM

## 2016-04-19 DIAGNOSIS — E1122 Type 2 diabetes mellitus with diabetic chronic kidney disease: Secondary | ICD-10-CM | POA: Insufficient documentation

## 2016-04-19 DIAGNOSIS — Z87891 Personal history of nicotine dependence: Secondary | ICD-10-CM | POA: Insufficient documentation

## 2016-04-19 DIAGNOSIS — I6523 Occlusion and stenosis of bilateral carotid arteries: Secondary | ICD-10-CM | POA: Insufficient documentation

## 2016-04-19 DIAGNOSIS — Z01818 Encounter for other preprocedural examination: Secondary | ICD-10-CM

## 2016-04-19 DIAGNOSIS — I252 Old myocardial infarction: Secondary | ICD-10-CM

## 2016-04-19 DIAGNOSIS — K219 Gastro-esophageal reflux disease without esophagitis: Secondary | ICD-10-CM | POA: Insufficient documentation

## 2016-04-19 DIAGNOSIS — Z79899 Other long term (current) drug therapy: Secondary | ICD-10-CM | POA: Insufficient documentation

## 2016-04-19 DIAGNOSIS — Z955 Presence of coronary angioplasty implant and graft: Secondary | ICD-10-CM

## 2016-04-19 DIAGNOSIS — Z9641 Presence of insulin pump (external) (internal): Secondary | ICD-10-CM | POA: Insufficient documentation

## 2016-04-19 DIAGNOSIS — E785 Hyperlipidemia, unspecified: Secondary | ICD-10-CM

## 2016-04-19 DIAGNOSIS — N183 Chronic kidney disease, stage 3 (moderate): Secondary | ICD-10-CM

## 2016-04-19 DIAGNOSIS — D62 Acute posthemorrhagic anemia: Secondary | ICD-10-CM | POA: Diagnosis not present

## 2016-04-19 DIAGNOSIS — Z7982 Long term (current) use of aspirin: Secondary | ICD-10-CM | POA: Diagnosis not present

## 2016-04-19 DIAGNOSIS — Z8249 Family history of ischemic heart disease and other diseases of the circulatory system: Secondary | ICD-10-CM | POA: Diagnosis not present

## 2016-04-19 DIAGNOSIS — Z833 Family history of diabetes mellitus: Secondary | ICD-10-CM | POA: Diagnosis not present

## 2016-04-19 DIAGNOSIS — E1165 Type 2 diabetes mellitus with hyperglycemia: Secondary | ICD-10-CM | POA: Diagnosis not present

## 2016-04-19 HISTORY — DX: Gastro-esophageal reflux disease without esophagitis: K21.9

## 2016-04-19 HISTORY — DX: Angina pectoris, unspecified: I20.9

## 2016-04-19 HISTORY — DX: Essential (primary) hypertension: I10

## 2016-04-19 HISTORY — DX: Cardiac murmur, unspecified: R01.1

## 2016-04-19 HISTORY — DX: Acute myocardial infarction, unspecified: I21.9

## 2016-04-19 LAB — BLOOD GAS, ARTERIAL
Acid-base deficit: 0.4 mmol/L (ref 0.0–2.0)
BICARBONATE: 24 meq/L (ref 20.0–24.0)
Drawn by: 206361
FIO2: 0.21
O2 SAT: 97.9 %
PATIENT TEMPERATURE: 98.6
TCO2: 25.3 mmol/L (ref 0–100)
pCO2 arterial: 41.1 mmHg (ref 35.0–45.0)
pH, Arterial: 7.385 (ref 7.350–7.450)
pO2, Arterial: 109 mmHg — ABNORMAL HIGH (ref 80.0–100.0)

## 2016-04-19 LAB — PULMONARY FUNCTION TEST
DL/VA % PRED: 86 %
DL/VA: 3.75 ml/min/mmHg/L
DLCO UNC: 15.9 ml/min/mmHg
DLCO unc % pred: 58 %
FEF 25-75 Post: 1.52 L/sec
FEF 25-75 Pre: 1.41 L/sec
FEF2575-%Change-Post: 7 %
FEF2575-%PRED-PRE: 67 %
FEF2575-%Pred-Post: 72 %
FEV1-%Change-Post: 2 %
FEV1-%Pred-Post: 74 %
FEV1-%Pred-Pre: 72 %
FEV1-PRE: 1.98 L
FEV1-Post: 2.03 L
FEV1FVC-%Change-Post: 1 %
FEV1FVC-%PRED-PRE: 96 %
FEV6-%Change-Post: 0 %
FEV6-%PRED-POST: 78 %
FEV6-%PRED-PRE: 78 %
FEV6-POST: 2.77 L
FEV6-Pre: 2.75 L
FEV6FVC-%CHANGE-POST: 0 %
FEV6FVC-%PRED-POST: 105 %
FEV6FVC-%Pred-Pre: 105 %
FVC-%CHANGE-POST: 1 %
FVC-%PRED-PRE: 73 %
FVC-%Pred-Post: 74 %
FVC-POST: 2.8 L
FVC-PRE: 2.77 L
POST FEV6/FVC RATIO: 99 %
PRE FEV1/FVC RATIO: 71 %
PRE FEV6/FVC RATIO: 100 %
Post FEV1/FVC ratio: 73 %
RV % pred: 84 %
RV: 1.9 L
TLC % PRED: 89 %
TLC: 5.55 L

## 2016-04-19 LAB — GLUCOSE, CAPILLARY: Glucose-Capillary: 153 mg/dL — ABNORMAL HIGH (ref 65–99)

## 2016-04-19 LAB — COMPREHENSIVE METABOLIC PANEL
ALBUMIN: 4 g/dL (ref 3.5–5.0)
ALK PHOS: 58 U/L (ref 38–126)
ALT: 24 U/L (ref 17–63)
ANION GAP: 10 (ref 5–15)
AST: 20 U/L (ref 15–41)
BUN: 16 mg/dL (ref 6–20)
CALCIUM: 8.7 mg/dL — AB (ref 8.9–10.3)
CHLORIDE: 105 mmol/L (ref 101–111)
CO2: 21 mmol/L — AB (ref 22–32)
Creatinine, Ser: 1.51 mg/dL — ABNORMAL HIGH (ref 0.61–1.24)
GFR calc Af Amer: 52 mL/min — ABNORMAL LOW (ref >60)
GFR calc non Af Amer: 45 mL/min — ABNORMAL LOW (ref >60)
GLUCOSE: 164 mg/dL — AB (ref 65–99)
Potassium: 4.2 mmol/L (ref 3.5–5.1)
SODIUM: 136 mmol/L (ref 135–145)
Total Bilirubin: 0.9 mg/dL (ref 0.3–1.2)
Total Protein: 6.6 g/dL (ref 6.5–8.1)

## 2016-04-19 LAB — CBC
HCT: 38.2 % — ABNORMAL LOW (ref 39.0–52.0)
HEMOGLOBIN: 12.8 g/dL — AB (ref 13.0–17.0)
MCH: 29.2 pg (ref 26.0–34.0)
MCHC: 33.5 g/dL (ref 30.0–36.0)
MCV: 87.2 fL (ref 78.0–100.0)
Platelets: 186 10*3/uL (ref 150–400)
RBC: 4.38 MIL/uL (ref 4.22–5.81)
RDW: 13.5 % (ref 11.5–15.5)
WBC: 5.6 10*3/uL (ref 4.0–10.5)

## 2016-04-19 LAB — URINALYSIS, ROUTINE W REFLEX MICROSCOPIC
BILIRUBIN URINE: NEGATIVE
GLUCOSE, UA: 100 mg/dL — AB
HGB URINE DIPSTICK: NEGATIVE
KETONES UR: NEGATIVE mg/dL
Leukocytes, UA: NEGATIVE
Nitrite: NEGATIVE
PH: 6 (ref 5.0–8.0)
Protein, ur: NEGATIVE mg/dL
Specific Gravity, Urine: 1.014 (ref 1.005–1.030)

## 2016-04-19 LAB — SURGICAL PCR SCREEN
MRSA, PCR: NEGATIVE
Staphylococcus aureus: POSITIVE — AB

## 2016-04-19 LAB — PROTIME-INR
INR: 1.06 (ref 0.00–1.49)
PROTHROMBIN TIME: 14 s (ref 11.6–15.2)

## 2016-04-19 LAB — APTT: aPTT: 27 seconds (ref 24–37)

## 2016-04-19 LAB — ABO/RH: ABO/RH(D): A POS

## 2016-04-19 MED ORDER — ALBUTEROL SULFATE (2.5 MG/3ML) 0.083% IN NEBU
2.5000 mg | INHALATION_SOLUTION | Freq: Once | RESPIRATORY_TRACT | Status: AC
  Administered 2016-04-19: 2.5 mg via RESPIRATORY_TRACT

## 2016-04-19 NOTE — Progress Notes (Signed)
VASCULAR LAB PRELIMINARY  PRELIMINARY  PRELIMINARY  PRELIMINARY  Pre-op Cardiac Surgery  Carotid Findings:  Right:  60-79% internal carotid artery stenosis.  Left:  1-39% ICA stenosis.  Bilateral:  Vertebral artery flow is antegrade.     Upper Extremity Right Left  Brachial Pressures 119  Triphasic  116  Triphasic   Radial Waveforms Triphasic  Triphasic   Ulnar Waveforms Triphasic  Triphasic   Palmar Arch (Allen's Test) Obliterates with both radial and ulnar compression Obliterates with radial compression, normal with ulnar compression    Lower  Extremity Right Left  Dorsalis Pedis 81  Monophasic  83  Monophasic   Anterior Tibial    Posterior Tibial 83  Monophasic  86  Monophasic   Ankle/Brachial Indices 0.74  Moderate 0.72  Moderate    Shawanda Sievert, RVT 04/19/2016, 1:00 PM

## 2016-04-19 NOTE — Pre-Procedure Instructions (Signed)
Donley RedderJerry E Shadwick  04/19/2016      [facility] - [place], [place] - [address] [place] [id number] Phone: [phone] Fax: [phone]  [facility] - [place] - [address] [place] [id number] Phone: [phone] Fax: [phone]    Your procedure is scheduled on 04/22/16.  [facility] at [facility].M.  Call this number if you have problems the morning of surgery:  [phone]   Remember:  Do not eat food or drink liquids after midnight.  Take these medicines the morning of surgery with A SIP OF WATER nitro if needed, pain med if needed hydrocodone), metoprolol,prilosec  STOP all herbel meds, nsaids (aleve,naproxen,advil,ibuprofen) now including vitamins    How to Manage Your Diabetes Before and After Surgery  Why is it important to control my blood sugar before and after surgery? . Improving blood sugar levels before and after surgery helps healing and can limit problems. . A way of improving blood sugar control is eating a healthy diet by: o  Eating less sugar and carbohydrates o  Increasing activity/exercise o  Talking with your doctor about reaching your blood sugar goals . High blood sugars (greater than 180 mg/dL) can raise your risk of infections and slow your recovery, so you will need to focus on controlling your diabetes during the weeks before surgery. . Make sure that the doctor who takes care of your diabetes knows about your planned surgery including the date and location.  How do I manage my blood sugar before surgery? . Check your blood sugar at least 4 times a day, starting 2 days before surgery, to make sure that the level is not too high or low. o Check your blood sugar the morning of your surgery when you wake up and every 2 hours until you get to the Short Stay unit. . If your blood sugar is less than 70 mg/dL, you will need to treat for low blood  sugar: o Do not take insulin. o Treat a low blood sugar (less than 70 mg/dL) with  cup of clear juice (cranberry or apple), 4 glucose tablets, OR glucose gel. o Recheck blood sugar in 15 minutes after treatment (to make sure it is greater than 70 mg/dL). If your blood sugar is not greater than 70 mg/dL on recheck, call [phone] for further instructions. . Report your blood sugar to the short stay nurse when you get to Short Stay.  . If you are admitted to the hospital after surgery: o Your blood sugar will be checked by the staff and you will probably be given insulin after surgery (instead of oral diabetes medicines) to make sure you have good blood sugar levels. o The goal for blood sugar control after surgery is 80-180 mg/dL.      WHAT DO I DO ABOUT MY DIABETES MEDICATIION  . Patient to call pcp. Does not know basal rate.bring supplies with you to restart pump .[name].  . .     Reviewed and Endorsed by [facility] Patient Education Committee, August 2015   Do not wear jewelry, make-up or nail polish.  Do not wear lotions, powders, or perfumes.  You may wear deodorant.  Do not shave 48 hours prior to surgery.  Men may shave face and neck.  Do not bring valuables to the hospital.  [facility] is not responsible for any belongings or valuables.  Contacts, dentures or  bridgework may not be worn into surgery.  Leave your suitcase in the car.  After surgery it may be brought to your room.  For patients admitted to the hospital, discharge time will be determined by your treatment team.  Patients discharged the day of surgery will not be allowed to drive home.   Name and phone number of your driver:    Special instructions:   Special Instructions: [facility] - Preparing for Surgery  Before surgery, you can play an important role.  Because skin is not sterile, your skin needs to be as free of germs as possible.  You can reduce the number of germs on you skin by washing with CHG  (chlorahexidine gluconate) soap before surgery.  CHG is an antiseptic cleaner which kills germs and bonds with the skin to continue killing germs even after washing.  Please DO NOT use if you have an allergy to CHG or antibacterial soaps.  If your skin becomes reddened/irritated stop using the CHG and inform your nurse when you arrive at Short Stay.  Do not shave (including legs and underarms) for at least 48 hours prior to the first CHG shower.  You may shave your face.  Please follow these instructions carefully:   1.  Shower with CHG Soap the night before surgery and the morning of Surgery.  2.  If you choose to wash your hair, wash your hair first as usual with your normal shampoo.  3.  After you shampoo, rinse your hair and body thoroughly to remove the Shampoo.  4.  Use CHG as you would any other liquid soap.  You can apply chg directly  to the skin and wash gently with scrungie or a clean washcloth.  5.  Apply the CHG Soap to your body ONLY FROM THE NECK DOWN.  Do not use on open wounds or open sores.  Avoid contact with your eyes ears, mouth and genitals (private parts).  Wash genitals (private parts)       with your normal soap.  6.  Wash thoroughly, paying special attention to the area where your surgery will be performed.  7.  Thoroughly rinse your body with warm water from the neck down.  8.  DO NOT shower/wash with your normal soap after using and rinsing off the CHG Soap.  9.  Pat yourself dry with a clean towel.            10.  Wear clean pajamas.            11.  Place clean sheets on your bed the night of your first shower and do not sleep with pets.  Day of Surgery  Do not apply any lotions/deodorants the morning of surgery.  Please wear clean clothes to the hospital/surgery center.  Please read over the following fact sheets that you were given. Pain Booklet, Coughing and Deep Breathing, Blood Transfusion Information, MRSA Information and Surgical Site Infection  Prevention

## 2016-04-20 LAB — HEMOGLOBIN A1C
Hgb A1c MFr Bld: 7.4 % — ABNORMAL HIGH (ref 4.8–5.6)
Mean Plasma Glucose: 166 mg/dL

## 2016-04-20 NOTE — Progress Notes (Signed)
Anesthesia Chart Review: Patient is a 70 year old male scheduled for CABG on 04/22/16 by Dr. Laneta SimmersBartle.  History includes CAD with NSTEMI 04/11/09 s/p DES LAD, mild mitral regurgitation (02/2016 echo), DM with peripheral neuropathy, HLD, PVD (occluded right CIA and high grade left proximal CIA by Dr. Allyson SabalBerry 04/05/16 note), HTN, former smoker (quit '10), COPD, CKD stage III, GERD. He has moderate RICA stenosis by carotid U/S on 04/19/16. PCP is Dr. Merri BrunetteWalter Pharr. Cardiologist is Dr. Nanetta BattyJonathan Berry.  Meds include insulin pump, Lopressor, lisinopril, ASA, Norco.  03/28/16 LHC:  Prox RCA to Mid RCA lesion, 70% stenosed.  Ost LAD to Prox LAD lesion, 10% stenosed. The lesion was previously treated with a stent (unknown type).  Prox Cx to Mid Cx lesion, 70% stenosed.  Ost Cx lesion, 95% stenosed.  02/23/16 Echo: Study Conclusions - Left ventricle: The cavity size was normal. Wall thickness was  increased in a pattern of mild LVH. Systolic function was normal.  The estimated ejection fraction was in the range of 60% to 65%.  Wall motion was normal; there were no regional wall motion  abnormalities. - Aortic valve: Valve area (Vmax): 2.32 cm^2. - Mitral valve: There was mild regurgitation.  04/19/16 Carotid U/S (Preliminary): Right: 60-79% internal carotid artery stenosis. Left: 1-39% ICA stenosis. Bilateral: Vertebral artery flow is antegrade.  Preoperative EKG and CXR noted.   04/19/16 PFTs: FVC 2.77 (73%), FEV1 1.98 (72%), DLCOunc 15.90 (58%).  Preoperative labs noted. Cr 1.51. H/H 12.8/38.2. A1c 7.4. Patient to contact his PCP regarding insulin pump instructions. He will be transitioned to IV insulin on the morning of surgery. DM Education department has been notified of patient's planned admission and need for insulin pump follow-up.  Velna Ochsllison Torres [place], PA-C [facility] Short Stay Center/Anesthesiology Phone [phone] 04/20/2016 12:01 PM

## 2016-04-21 MED ORDER — MAGNESIUM SULFATE 50 % IJ SOLN
40.0000 meq | INTRAMUSCULAR | Status: DC
  Filled 2016-04-21: qty 10

## 2016-04-21 MED ORDER — VANCOMYCIN HCL 10 G IV SOLR
1250.0000 mg | INTRAVENOUS | Status: AC
  Administered 2016-04-22: 1250 mg via INTRAVENOUS
  Filled 2016-04-21: qty 1250

## 2016-04-21 MED ORDER — CEFUROXIME SODIUM 1.5 G IJ SOLR
1.5000 g | INTRAMUSCULAR | Status: AC
  Administered 2016-04-22: 1.5 g via INTRAVENOUS
  Administered 2016-04-22: .75 g via INTRAVENOUS
  Filled 2016-04-21 (×2): qty 1.5

## 2016-04-21 MED ORDER — PLASMA-LYTE 148 IV SOLN
INTRAVENOUS | Status: AC
  Administered 2016-04-22: 500 mL
  Filled 2016-04-21: qty 2.5

## 2016-04-21 MED ORDER — SODIUM CHLORIDE 0.9 % IV SOLN
INTRAVENOUS | Status: AC
  Administered 2016-04-22: 69.8 mL/h via INTRAVENOUS
  Filled 2016-04-21: qty 40

## 2016-04-21 MED ORDER — SODIUM CHLORIDE 0.9 % IV SOLN
INTRAVENOUS | Status: DC
  Filled 2016-04-21: qty 30

## 2016-04-21 MED ORDER — DOPAMINE-DEXTROSE 3.2-5 MG/ML-% IV SOLN
0.0000 ug/kg/min | INTRAVENOUS | Status: DC
  Filled 2016-04-21: qty 250

## 2016-04-21 MED ORDER — EPINEPHRINE HCL 1 MG/ML IJ SOLN
0.0000 ug/min | INTRAVENOUS | Status: DC
  Filled 2016-04-21: qty 4

## 2016-04-21 MED ORDER — DEXTROSE 5 % IV SOLN
30.0000 ug/min | INTRAVENOUS | Status: AC
  Administered 2016-04-22: 25 ug/min via INTRAVENOUS
  Filled 2016-04-21: qty 2

## 2016-04-21 MED ORDER — CHLORHEXIDINE GLUCONATE 0.12 % MT SOLN
15.0000 mL | Freq: Once | OROMUCOSAL | Status: AC
  Administered 2016-04-22: 15 mL via OROMUCOSAL
  Filled 2016-04-21: qty 15

## 2016-04-21 MED ORDER — METOPROLOL TARTRATE 12.5 MG HALF TABLET: 12.5000 mg | ORAL_TABLET | Freq: Once | ORAL | Status: DC

## 2016-04-21 MED ORDER — SODIUM CHLORIDE 0.9 % IV SOLN
INTRAVENOUS | Status: AC
  Administered 2016-04-22: 3.1 [IU]/h via INTRAVENOUS
  Filled 2016-04-21: qty 2.5

## 2016-04-21 MED ORDER — DEXMEDETOMIDINE HCL IN NACL 400 MCG/100ML IV SOLN
0.1000 ug/kg/h | INTRAVENOUS | Status: AC
  Administered 2016-04-22: 0.7 ug/kg/h via INTRAVENOUS
  Filled 2016-04-21: qty 100

## 2016-04-21 MED ORDER — POTASSIUM CHLORIDE 2 MEQ/ML IV SOLN
80.0000 meq | INTRAVENOUS | Status: DC
  Filled 2016-04-21: qty 40

## 2016-04-21 MED ORDER — DEXTROSE 5 % IV SOLN
750.0000 mg | INTRAVENOUS | Status: DC
  Filled 2016-04-21: qty 750

## 2016-04-21 MED ORDER — NITROGLYCERIN IN D5W 200-5 MCG/ML-% IV SOLN
2.0000 ug/min | INTRAVENOUS | Status: AC
  Administered 2016-04-22: 5 ug/min via INTRAVENOUS
  Filled 2016-04-21: qty 250

## 2016-04-22 ENCOUNTER — Inpatient Hospital Stay (HOSPITAL_COMMUNITY): Payer: Medicare Other

## 2016-04-22 ENCOUNTER — Inpatient Hospital Stay (HOSPITAL_COMMUNITY)
Admission: RE | Admit: 2016-04-22 | Discharge: 2016-04-27 | DRG: 236 | Disposition: A | Discharge status: Home / Self Care | Payer: Medicare Other | Source: Ambulatory Visit | Attending: Surgery | Admitting: Surgery

## 2016-04-22 ENCOUNTER — Encounter (HOSPITAL_COMMUNITY): Payer: Self-pay | Admitting: Critical Care Medicine

## 2016-04-22 ENCOUNTER — Inpatient Hospital Stay (HOSPITAL_COMMUNITY): Payer: Medicare Other | Admitting: Vascular Surgery

## 2016-04-22 ENCOUNTER — Inpatient Hospital Stay (HOSPITAL_COMMUNITY): Payer: Medicare Other | Admitting: Critical Care Medicine

## 2016-04-22 ENCOUNTER — Encounter (HOSPITAL_COMMUNITY)
Admission: RE | Disposition: A | Discharge status: Home / Self Care | Payer: Self-pay | Source: Ambulatory Visit | Attending: Surgery

## 2016-04-22 DIAGNOSIS — I2510 Atherosclerotic heart disease of native coronary artery without angina pectoris: Principal | ICD-10-CM | POA: Diagnosis present

## 2016-04-22 DIAGNOSIS — I739 Peripheral vascular disease, unspecified: Secondary | ICD-10-CM | POA: Diagnosis present

## 2016-04-22 DIAGNOSIS — I340 Nonrheumatic mitral (valve) insufficiency: Secondary | ICD-10-CM | POA: Diagnosis present

## 2016-04-22 DIAGNOSIS — I2583 Coronary atherosclerosis due to lipid rich plaque: Secondary | ICD-10-CM | POA: Diagnosis present

## 2016-04-22 DIAGNOSIS — N183 Chronic kidney disease, stage 3 (moderate): Secondary | ICD-10-CM | POA: Diagnosis present

## 2016-04-22 DIAGNOSIS — Z7982 Long term (current) use of aspirin: Secondary | ICD-10-CM | POA: Diagnosis not present

## 2016-04-22 DIAGNOSIS — E1142 Type 2 diabetes mellitus with diabetic polyneuropathy: Secondary | ICD-10-CM | POA: Diagnosis present

## 2016-04-22 DIAGNOSIS — Z79899 Other long term (current) drug therapy: Secondary | ICD-10-CM

## 2016-04-22 DIAGNOSIS — I252 Old myocardial infarction: Secondary | ICD-10-CM | POA: Diagnosis not present

## 2016-04-22 DIAGNOSIS — I080 Rheumatic disorders of both mitral and aortic valves: Secondary | ICD-10-CM | POA: Diagnosis not present

## 2016-04-22 DIAGNOSIS — I129 Hypertensive chronic kidney disease with stage 1 through stage 4 chronic kidney disease, or unspecified chronic kidney disease: Secondary | ICD-10-CM | POA: Diagnosis present

## 2016-04-22 DIAGNOSIS — E1165 Type 2 diabetes mellitus with hyperglycemia: Secondary | ICD-10-CM | POA: Diagnosis present

## 2016-04-22 DIAGNOSIS — Z87891 Personal history of nicotine dependence: Secondary | ICD-10-CM | POA: Diagnosis not present

## 2016-04-22 DIAGNOSIS — D62 Acute posthemorrhagic anemia: Secondary | ICD-10-CM | POA: Diagnosis not present

## 2016-04-22 DIAGNOSIS — R110 Nausea: Secondary | ICD-10-CM | POA: Diagnosis not present

## 2016-04-22 DIAGNOSIS — E1122 Type 2 diabetes mellitus with diabetic chronic kidney disease: Secondary | ICD-10-CM | POA: Diagnosis present

## 2016-04-22 DIAGNOSIS — E785 Hyperlipidemia, unspecified: Secondary | ICD-10-CM | POA: Diagnosis present

## 2016-04-22 DIAGNOSIS — Z8249 Family history of ischemic heart disease and other diseases of the circulatory system: Secondary | ICD-10-CM | POA: Diagnosis not present

## 2016-04-22 DIAGNOSIS — K219 Gastro-esophageal reflux disease without esophagitis: Secondary | ICD-10-CM | POA: Diagnosis present

## 2016-04-22 DIAGNOSIS — Z951 Presence of aortocoronary bypass graft: Secondary | ICD-10-CM

## 2016-04-22 DIAGNOSIS — Z833 Family history of diabetes mellitus: Secondary | ICD-10-CM

## 2016-04-22 DIAGNOSIS — Z794 Long term (current) use of insulin: Secondary | ICD-10-CM | POA: Diagnosis not present

## 2016-04-22 DIAGNOSIS — Z888 Allergy status to other drugs, medicaments and biological substances status: Secondary | ICD-10-CM

## 2016-04-22 DIAGNOSIS — Z955 Presence of coronary angioplasty implant and graft: Secondary | ICD-10-CM

## 2016-04-22 DIAGNOSIS — J811 Chronic pulmonary edema: Secondary | ICD-10-CM | POA: Diagnosis not present

## 2016-04-22 DIAGNOSIS — J449 Chronic obstructive pulmonary disease, unspecified: Secondary | ICD-10-CM | POA: Diagnosis present

## 2016-04-22 HISTORY — PX: CORONARY ARTERY BYPASS GRAFT: SHX141

## 2016-04-22 HISTORY — PX: TEE WITHOUT CARDIOVERSION: SHX5443

## 2016-04-22 LAB — POCT I-STAT 3, ART BLOOD GAS (G3+)
ACID-BASE DEFICIT: 3 mmol/L — AB (ref 0.0–2.0)
ACID-BASE DEFICIT: 2 mmol/L (ref 0.0–2.0)
Acid-base deficit: 2 mmol/L (ref 0.0–2.0)
BICARBONATE: 23.4 meq/L (ref 20.0–24.0)
Bicarbonate: 23.5 mEq/L (ref 20.0–24.0)
Bicarbonate: 23 mEq/L (ref 20.0–24.0)
O2 SAT: 97 %
O2 Saturation: 99 %
O2 Saturation: 98 %
PCO2 ART: 46.1 mmHg — AB (ref 35.0–45.0)
PH ART: 7.321 — AB (ref 7.350–7.450)
PO2 ART: 148 mmHg — AB (ref 80.0–100.0)
PO2 ART: 123 mmHg — AB (ref 80.0–100.0)
PO2 ART: 98 mmHg (ref 80.0–100.0)
Patient temperature: 38.3
Patient temperature: 37.9
TCO2: 25 mmol/L (ref 0–100)
TCO2: 25 mmol/L (ref 0–100)
TCO2: 24 mmol/L (ref 0–100)
pCO2 arterial: 48.4 mmHg — ABNORMAL HIGH (ref 35.0–45.0)
pCO2 arterial: 42.7 mmHg (ref 35.0–45.0)
pH, Arterial: 7.297 — ABNORMAL LOW (ref 7.350–7.450)
pH, Arterial: 7.343 — ABNORMAL LOW (ref 7.350–7.450)

## 2016-04-22 LAB — PROTIME-INR
INR: 1.47 (ref 0.00–1.49)
PROTHROMBIN TIME: 17.9 s — AB (ref 11.6–15.2)

## 2016-04-22 LAB — CBC
HCT: 28.9 % — ABNORMAL LOW (ref 39.0–52.0)
HCT: 26.7 % — ABNORMAL LOW (ref 39.0–52.0)
HEMATOCRIT: 25.4 % — AB (ref 39.0–52.0)
HEMOGLOBIN: 8.6 g/dL — AB (ref 13.0–17.0)
HEMOGLOBIN: 8.9 g/dL — AB (ref 13.0–17.0)
Hemoglobin: 9.6 g/dL — ABNORMAL LOW (ref 13.0–17.0)
MCH: 28.4 pg (ref 26.0–34.0)
MCH: 29.1 pg (ref 26.0–34.0)
MCH: 28.8 pg (ref 26.0–34.0)
MCHC: 33.2 g/dL (ref 30.0–36.0)
MCHC: 33.9 g/dL (ref 30.0–36.0)
MCHC: 33.3 g/dL (ref 30.0–36.0)
MCV: 85.5 fL (ref 78.0–100.0)
MCV: 85.8 fL (ref 78.0–100.0)
MCV: 86.4 fL (ref 78.0–100.0)
PLATELETS: 123 10*3/uL — AB (ref 150–400)
Platelets: 142 10*3/uL — ABNORMAL LOW (ref 150–400)
Platelets: 154 10*3/uL (ref 150–400)
RBC: 3.38 MIL/uL — AB (ref 4.22–5.81)
RBC: 2.96 MIL/uL — AB (ref 4.22–5.81)
RBC: 3.09 MIL/uL — AB (ref 4.22–5.81)
RDW: 13.2 % (ref 11.5–15.5)
RDW: 13.3 % (ref 11.5–15.5)
RDW: 13.5 % (ref 11.5–15.5)
WBC: 6.5 10*3/uL (ref 4.0–10.5)
WBC: 8.5 10*3/uL (ref 4.0–10.5)
WBC: 9.1 10*3/uL (ref 4.0–10.5)

## 2016-04-22 LAB — POCT I-STAT, CHEM 8
BUN: 15 mg/dL (ref 6–20)
CHLORIDE: 106 mmol/L (ref 101–111)
Calcium, Ion: 1.09 mmol/L — ABNORMAL LOW (ref 1.13–1.30)
Creatinine, Ser: 1.3 mg/dL — ABNORMAL HIGH (ref 0.61–1.24)
Glucose, Bld: 126 mg/dL — ABNORMAL HIGH (ref 65–99)
HEMATOCRIT: 26 % — AB (ref 39.0–52.0)
Hemoglobin: 8.8 g/dL — ABNORMAL LOW (ref 13.0–17.0)
POTASSIUM: 5.4 mmol/L — AB (ref 3.5–5.1)
SODIUM: 139 mmol/L (ref 135–145)
TCO2: 23 mmol/L (ref 0–100)

## 2016-04-22 LAB — CREATININE, SERUM
Creatinine, Ser: 1.44 mg/dL — ABNORMAL HIGH (ref 0.61–1.24)
GFR calc non Af Amer: 48 mL/min — ABNORMAL LOW (ref >60)
GFR, EST AFRICAN AMERICAN: 55 mL/min — AB (ref >60)

## 2016-04-22 LAB — GLUCOSE, CAPILLARY
GLUCOSE-CAPILLARY: 209 mg/dL — AB (ref 65–99)
GLUCOSE-CAPILLARY: 85 mg/dL (ref 65–99)

## 2016-04-22 LAB — APTT: aPTT: 31 seconds (ref 24–37)

## 2016-04-22 LAB — MAGNESIUM: MAGNESIUM: 1.9 mg/dL (ref 1.7–2.4)

## 2016-04-22 LAB — HEMOGLOBIN AND HEMATOCRIT, BLOOD
HEMATOCRIT: 23.5 % — AB (ref 39.0–52.0)
HEMOGLOBIN: 7.9 g/dL — AB (ref 13.0–17.0)

## 2016-04-22 LAB — PLATELET COUNT: Platelets: 126 10*3/uL — ABNORMAL LOW (ref 150–400)

## 2016-04-22 SURGERY — CORONARY ARTERY BYPASS GRAFTING (CABG)
Anesthesia: General | Site: Chest

## 2016-04-22 MED ORDER — LACTATED RINGERS IV SOLN: 500.0000 mL | Freq: Once | INTRAVENOUS | Status: DC | PRN

## 2016-04-22 MED ORDER — CHLORHEXIDINE GLUCONATE 0.12 % MT SOLN: 15.0000 mL | OROMUCOSAL | Status: AC

## 2016-04-22 MED ORDER — FENTANYL CITRATE (PF) 100 MCG/2ML IJ SOLN
INTRAMUSCULAR | Status: DC | PRN
  Administered 2016-04-22: 150 ug via INTRAVENOUS
  Administered 2016-04-22: 50 ug via INTRAVENOUS
  Administered 2016-04-22: 150 ug via INTRAVENOUS
  Administered 2016-04-22: 200 ug via INTRAVENOUS
  Administered 2016-04-22 (×2): 150 ug via INTRAVENOUS
  Administered 2016-04-22 (×3): 50 ug via INTRAVENOUS
  Administered 2016-04-22: 150 ug via INTRAVENOUS
  Administered 2016-04-22: 300 ug via INTRAVENOUS
  Administered 2016-04-22: 50 ug via INTRAVENOUS

## 2016-04-22 MED ORDER — ASPIRIN 81 MG PO CHEW
324.0000 mg | CHEWABLE_TABLET | Freq: Every day | ORAL | Status: DC
  Administered 2016-04-24 – 2016-04-25 (×2): 324 mg
  Filled 2016-04-22 (×3): qty 4

## 2016-04-22 MED ORDER — DEXTROSE 5 % IV SOLN
1.5000 g | Freq: Two times a day (BID) | INTRAVENOUS | Status: AC
  Administered 2016-04-23 – 2016-04-24 (×4): 1.5 g via INTRAVENOUS
  Filled 2016-04-22 (×4): qty 1.5

## 2016-04-22 MED ORDER — SODIUM CHLORIDE 0.9% FLUSH
3.0000 mL | Freq: Two times a day (BID) | INTRAVENOUS | Status: DC
  Administered 2016-04-23 – 2016-04-26 (×5): 3 mL via INTRAVENOUS

## 2016-04-22 MED ORDER — PROPOFOL 10 MG/ML IV BOLUS
INTRAVENOUS | Status: DC | PRN
  Administered 2016-04-22: 20 mg via INTRAVENOUS
  Administered 2016-04-22: 30 mg via INTRAVENOUS
  Administered 2016-04-22: 20 mg via INTRAVENOUS

## 2016-04-22 MED ORDER — ACETAMINOPHEN 160 MG/5ML PO SOLN: 650.0000 mg | Freq: Once | ORAL | Status: AC

## 2016-04-22 MED ORDER — ALBUMIN HUMAN 5 % IV SOLN
INTRAVENOUS | Status: DC | PRN
  Administered 2016-04-22: 15:00:00 via INTRAVENOUS

## 2016-04-22 MED ORDER — THROMBIN 20000 UNITS EX SOLR
CUTANEOUS | Status: DC | PRN
  Administered 2016-04-22: 20000 [IU] via TOPICAL

## 2016-04-22 MED ORDER — LACTATED RINGERS IV SOLN
INTRAVENOUS | Status: DC
  Administered 2016-04-22: 20 mL/h via INTRAVENOUS

## 2016-04-22 MED ORDER — CHLORHEXIDINE GLUCONATE 0.12% ORAL RINSE (MEDLINE KIT)
15.0000 mL | Freq: Two times a day (BID) | OROMUCOSAL | Status: DC
  Administered 2016-04-22 – 2016-04-23 (×2): 15 mL via OROMUCOSAL

## 2016-04-22 MED ORDER — LACTATED RINGERS IV SOLN
INTRAVENOUS | Status: DC | PRN
  Administered 2016-04-22: 09:00:00 via INTRAVENOUS

## 2016-04-22 MED ORDER — HEMOSTATIC AGENTS (NO CHARGE) OPTIME
TOPICAL | Status: DC | PRN
Start: 2016-04-22 — End: 2016-04-22
  Administered 2016-04-22: 1 via TOPICAL

## 2016-04-22 MED ORDER — SODIUM CHLORIDE 0.9 % IV SOLN: 250.0000 mL | INTRAVENOUS | Status: DC

## 2016-04-22 MED ORDER — HEPARIN SODIUM (PORCINE) 1000 UNIT/ML IJ SOLN
INTRAMUSCULAR | Status: DC | PRN
  Administered 2016-04-22: 20000 [IU] via INTRAVENOUS

## 2016-04-22 MED ORDER — PANTOPRAZOLE SODIUM 40 MG PO TBEC
40.0000 mg | DELAYED_RELEASE_TABLET | Freq: Every day | ORAL | Status: DC
  Administered 2016-04-23 – 2016-04-27 (×5): 40 mg via ORAL
  Filled 2016-04-22 (×5): qty 1

## 2016-04-22 MED ORDER — ACETAMINOPHEN 160 MG/5ML PO SOLN
1000.0000 mg | Freq: Four times a day (QID) | ORAL | Status: DC
  Administered 2016-04-24 (×3): 1000 mg
  Filled 2016-04-22 (×4): qty 40.6

## 2016-04-22 MED ORDER — MORPHINE SULFATE (PF) 2 MG/ML IV SOLN
2.0000 mg | INTRAVENOUS | Status: DC | PRN
  Administered 2016-04-22 – 2016-04-24 (×9): 2 mg via INTRAVENOUS
  Filled 2016-04-22 (×9): qty 1

## 2016-04-22 MED ORDER — LIDOCAINE HCL (CARDIAC) 20 MG/ML IV SOLN
INTRAVENOUS | Status: DC | PRN
  Administered 2016-04-22: 50 mg via INTRAVENOUS

## 2016-04-22 MED ORDER — LACTATED RINGERS IV SOLN: INTRAVENOUS | Status: DC

## 2016-04-22 MED ORDER — METOPROLOL TARTRATE 12.5 MG HALF TABLET: 12.5000 mg | ORAL_TABLET | Freq: Two times a day (BID) | ORAL | Status: DC

## 2016-04-22 MED ORDER — DEXTROSE 5 % IV SOLN
0.0000 ug/min | INTRAVENOUS | Status: DC
  Administered 2016-04-23: 35 ug/min via INTRAVENOUS
  Filled 2016-04-22: qty 2

## 2016-04-22 MED ORDER — SODIUM CHLORIDE 0.9 % IJ SOLN
INTRAMUSCULAR | Status: AC
  Filled 2016-04-22: qty 20

## 2016-04-22 MED ORDER — ACETAMINOPHEN 650 MG RE SUPP
650.0000 mg | Freq: Once | RECTAL | Status: AC
  Administered 2016-04-22: 650 mg via RECTAL

## 2016-04-22 MED ORDER — ROCURONIUM BROMIDE 50 MG/5ML IV SOLN
INTRAVENOUS | Status: AC
  Filled 2016-04-22: qty 2

## 2016-04-22 MED ORDER — BISACODYL 5 MG PO TBEC
10.0000 mg | DELAYED_RELEASE_TABLET | Freq: Every day | ORAL | Status: DC
  Administered 2016-04-23 – 2016-04-24 (×2): 10 mg via ORAL
  Filled 2016-04-22 (×2): qty 2

## 2016-04-22 MED ORDER — THROMBIN 20000 UNITS EX SOLR
CUTANEOUS | Status: AC
  Filled 2016-04-22: qty 20000

## 2016-04-22 MED ORDER — ARTIFICIAL TEARS OP OINT
TOPICAL_OINTMENT | OPHTHALMIC | Status: DC | PRN
  Administered 2016-04-22: 1 via OPHTHALMIC

## 2016-04-22 MED ORDER — SODIUM CHLORIDE 0.9 % IV SOLN
INTRAVENOUS | Status: DC
  Administered 2016-04-23: 1.7 [IU]/h via INTRAVENOUS
  Filled 2016-04-22: qty 2.5

## 2016-04-22 MED ORDER — VANCOMYCIN HCL IN DEXTROSE 1-5 GM/200ML-% IV SOLN
1000.0000 mg | Freq: Once | INTRAVENOUS | Status: AC
  Administered 2016-04-22: 1000 mg via INTRAVENOUS
  Filled 2016-04-22: qty 200

## 2016-04-22 MED ORDER — GELATIN ABSORBABLE MT POWD
OROMUCOSAL | Status: DC | PRN
  Administered 2016-04-22 (×3): 4 mL via TOPICAL

## 2016-04-22 MED ORDER — ONDANSETRON HCL 4 MG/2ML IJ SOLN
INTRAMUSCULAR | Status: AC
  Filled 2016-04-22: qty 2

## 2016-04-22 MED ORDER — SODIUM CHLORIDE 0.45 % IV SOLN
INTRAVENOUS | Status: DC | PRN
  Administered 2016-04-22: 16:00:00 via INTRAVENOUS

## 2016-04-22 MED ORDER — MIDAZOLAM HCL 2 MG/2ML IJ SOLN: 2.0000 mg | INTRAMUSCULAR | Status: DC | PRN

## 2016-04-22 MED ORDER — HEPARIN SODIUM (PORCINE) 1000 UNIT/ML IJ SOLN
INTRAMUSCULAR | Status: AC
  Filled 2016-04-22: qty 1

## 2016-04-22 MED ORDER — ONDANSETRON HCL 4 MG/2ML IJ SOLN
4.0000 mg | Freq: Four times a day (QID) | INTRAMUSCULAR | Status: DC | PRN
  Administered 2016-04-23 – 2016-04-25 (×6): 4 mg via INTRAVENOUS
  Filled 2016-04-22 (×6): qty 2

## 2016-04-22 MED ORDER — PANTOPRAZOLE SODIUM 40 MG PO TBEC: 40.0000 mg | DELAYED_RELEASE_TABLET | Freq: Every day | ORAL | Status: DC

## 2016-04-22 MED ORDER — OXYCODONE HCL 5 MG PO TABS
5.0000 mg | ORAL_TABLET | ORAL | Status: DC | PRN
  Administered 2016-04-23 (×4): 10 mg via ORAL
  Administered 2016-04-23: 5 mg via ORAL
  Filled 2016-04-22 (×3): qty 2
  Filled 2016-04-22: qty 1
  Filled 2016-04-22: qty 2

## 2016-04-22 MED ORDER — 0.9 % SODIUM CHLORIDE (POUR BTL) OPTIME
TOPICAL | Status: DC | PRN
Start: 2016-04-22 — End: 2016-04-22
  Administered 2016-04-22: 5000 mL

## 2016-04-22 MED ORDER — LIDOCAINE 2% (20 MG/ML) 5 ML SYRINGE
INTRAMUSCULAR | Status: AC
  Filled 2016-04-22: qty 5

## 2016-04-22 MED ORDER — DEXMEDETOMIDINE HCL IN NACL 200 MCG/50ML IV SOLN
0.0000 ug/kg/h | INTRAVENOUS | Status: DC
  Filled 2016-04-22: qty 50

## 2016-04-22 MED ORDER — ARTIFICIAL TEARS OP OINT
TOPICAL_OINTMENT | OPHTHALMIC | Status: AC
  Filled 2016-04-22: qty 3.5

## 2016-04-22 MED ORDER — MAGNESIUM SULFATE 4 GM/100ML IV SOLN
4.0000 g | Freq: Once | INTRAVENOUS | Status: AC
  Administered 2016-04-22: 4 g via INTRAVENOUS
  Filled 2016-04-22: qty 100

## 2016-04-22 MED ORDER — BISACODYL 10 MG RE SUPP: 10.0000 mg | Freq: Every day | RECTAL | Status: DC

## 2016-04-22 MED ORDER — MIDAZOLAM HCL 10 MG/2ML IJ SOLN
INTRAMUSCULAR | Status: AC
  Filled 2016-04-22: qty 2

## 2016-04-22 MED ORDER — MORPHINE SULFATE (PF) 2 MG/ML IV SOLN
1.0000 mg | INTRAVENOUS | Status: AC | PRN
  Administered 2016-04-22 (×2): 2 mg via INTRAVENOUS
  Administered 2016-04-22: 1 mg via INTRAVENOUS
  Filled 2016-04-22 (×3): qty 1

## 2016-04-22 MED ORDER — PROTAMINE SULFATE 10 MG/ML IV SOLN
INTRAVENOUS | Status: DC | PRN
  Administered 2016-04-22: 200 mg via INTRAVENOUS

## 2016-04-22 MED ORDER — TRAMADOL HCL 50 MG PO TABS
50.0000 mg | ORAL_TABLET | ORAL | Status: DC | PRN
  Administered 2016-04-23: 100 mg via ORAL
  Filled 2016-04-22: qty 2

## 2016-04-22 MED ORDER — SODIUM CHLORIDE 0.9% FLUSH: 3.0000 mL | INTRAVENOUS | Status: DC | PRN

## 2016-04-22 MED ORDER — ROCURONIUM BROMIDE 100 MG/10ML IV SOLN
INTRAVENOUS | Status: DC | PRN
  Administered 2016-04-22: 40 mg via INTRAVENOUS
  Administered 2016-04-22 (×2): 50 mg via INTRAVENOUS
  Administered 2016-04-22: 60 mg via INTRAVENOUS

## 2016-04-22 MED ORDER — PROPOFOL 10 MG/ML IV BOLUS
INTRAVENOUS | Status: AC
  Filled 2016-04-22: qty 20

## 2016-04-22 MED ORDER — FENTANYL CITRATE (PF) 250 MCG/5ML IJ SOLN
INTRAMUSCULAR | Status: AC
  Filled 2016-04-22: qty 25

## 2016-04-22 MED ORDER — MIDAZOLAM HCL 5 MG/5ML IJ SOLN
INTRAMUSCULAR | Status: DC | PRN
  Administered 2016-04-22: 3 mg via INTRAVENOUS
  Administered 2016-04-22 (×2): 2 mg via INTRAVENOUS
  Administered 2016-04-22 (×3): 1 mg via INTRAVENOUS

## 2016-04-22 MED ORDER — NITROGLYCERIN IN D5W 200-5 MCG/ML-% IV SOLN: 0.0000 ug/min | INTRAVENOUS | Status: DC

## 2016-04-22 MED ORDER — FAMOTIDINE IN NACL 20-0.9 MG/50ML-% IV SOLN
20.0000 mg | Freq: Two times a day (BID) | INTRAVENOUS | Status: AC
  Administered 2016-04-22: 20 mg via INTRAVENOUS

## 2016-04-22 MED ORDER — METOPROLOL TARTRATE 5 MG/5ML IV SOLN: 2.5000 mg | INTRAVENOUS | Status: DC | PRN

## 2016-04-22 MED ORDER — LACTATED RINGERS IV SOLN
INTRAVENOUS | Status: DC
  Administered 2016-04-22 (×2): via INTRAVENOUS

## 2016-04-22 MED ORDER — METOPROLOL TARTRATE 25 MG/10 ML ORAL SUSPENSION: 12.5000 mg | Freq: Two times a day (BID) | ORAL | Status: DC

## 2016-04-22 MED ORDER — DOCUSATE SODIUM 100 MG PO CAPS
200.0000 mg | ORAL_CAPSULE | Freq: Every day | ORAL | Status: DC
  Administered 2016-04-23: 200 mg via ORAL
  Filled 2016-04-22 (×2): qty 2

## 2016-04-22 MED ORDER — PHENYLEPHRINE HCL 10 MG/ML IJ SOLN
INTRAMUSCULAR | Status: DC | PRN
  Administered 2016-04-22: 80 ug via INTRAVENOUS

## 2016-04-22 MED ORDER — INSULIN REGULAR BOLUS VIA INFUSION
0.0000 [IU] | Freq: Three times a day (TID) | INTRAVENOUS | Status: DC
  Administered 2016-04-23: 0 [IU] via INTRAVENOUS
  Filled 2016-04-22: qty 10

## 2016-04-22 MED ORDER — FENTANYL CITRATE (PF) 250 MCG/5ML IJ SOLN
INTRAMUSCULAR | Status: AC
  Filled 2016-04-22: qty 5

## 2016-04-22 MED ORDER — SODIUM CHLORIDE 0.9 % IV SOLN: INTRAVENOUS | Status: DC

## 2016-04-22 MED ORDER — ANTISEPTIC ORAL RINSE SOLUTION (CORINZ)
7.0000 mL | Freq: Four times a day (QID) | OROMUCOSAL | Status: DC
  Administered 2016-04-23 (×2): 7 mL via OROMUCOSAL

## 2016-04-22 MED ORDER — ALBUMIN HUMAN 5 % IV SOLN
250.0000 mL | INTRAVENOUS | Status: AC | PRN
  Administered 2016-04-22 – 2016-04-23 (×4): 250 mL via INTRAVENOUS
  Filled 2016-04-22 (×2): qty 250

## 2016-04-22 MED ORDER — SODIUM CHLORIDE 0.9 % IV SOLN
INTRAVENOUS | Status: DC | PRN
  Administered 2016-04-22: 15:00:00 via INTRAVENOUS

## 2016-04-22 MED ORDER — ASPIRIN EC 325 MG PO TBEC
325.0000 mg | DELAYED_RELEASE_TABLET | Freq: Every day | ORAL | Status: DC
  Administered 2016-04-23 – 2016-04-26 (×2): 325 mg via ORAL
  Filled 2016-04-22 (×3): qty 1

## 2016-04-22 MED ORDER — PROTAMINE SULFATE 10 MG/ML IV SOLN
INTRAVENOUS | Status: AC
  Filled 2016-04-22: qty 25

## 2016-04-22 MED ORDER — POTASSIUM CHLORIDE 10 MEQ/50ML IV SOLN
10.0000 meq | INTRAVENOUS | Status: AC
  Administered 2016-04-22 (×3): 10 meq via INTRAVENOUS

## 2016-04-22 MED ORDER — ACETAMINOPHEN 500 MG PO TABS
1000.0000 mg | ORAL_TABLET | Freq: Four times a day (QID) | ORAL | Status: DC
  Administered 2016-04-23 – 2016-04-24 (×3): 1000 mg via ORAL
  Filled 2016-04-22 (×4): qty 2

## 2016-04-22 MED ORDER — METOCLOPRAMIDE HCL 5 MG/ML IJ SOLN
10.0000 mg | Freq: Four times a day (QID) | INTRAMUSCULAR | Status: AC
  Administered 2016-04-22 – 2016-04-23 (×4): 10 mg via INTRAVENOUS
  Filled 2016-04-22 (×4): qty 2

## 2016-04-22 MED FILL — Magnesium Sulfate Inj 50%: INTRAMUSCULAR | Qty: 10 | Status: AC

## 2016-04-22 MED FILL — Heparin Sodium (Porcine) Inj 1000 Unit/ML: INTRAMUSCULAR | Qty: 30 | Status: AC

## 2016-04-22 MED FILL — Potassium Chloride Inj 2 mEq/ML: INTRAVENOUS | Qty: 40 | Status: AC

## 2016-04-22 SURGICAL SUPPLY — 97 items
ADH SKN CLS APL DERMABOND .7 (GAUZE/BANDAGES/DRESSINGS) ×2
BAG DECANTER FOR FLEXI CONT (MISCELLANEOUS) ×3 IMPLANT
BANDAGE ELASTIC 4 VELCRO ST LF (GAUZE/BANDAGES/DRESSINGS) ×3 IMPLANT
BANDAGE ELASTIC 6 VELCRO ST LF (GAUZE/BANDAGES/DRESSINGS) ×3 IMPLANT
BASKET HEART (ORDER IN 25'S) (MISCELLANEOUS) ×1
BASKET HEART (ORDER IN 25S) (MISCELLANEOUS) ×2 IMPLANT
BLADE STERNUM SYSTEM 6 (BLADE) ×3 IMPLANT
BLADE SURG 11 STRL SS (BLADE) ×3 IMPLANT
BNDG GAUZE ELAST 4 BULKY (GAUZE/BANDAGES/DRESSINGS) ×3 IMPLANT
CANISTER SUCTION 2500CC (MISCELLANEOUS) ×3 IMPLANT
CATH ROBINSON RED A/P 18FR (CATHETERS) ×6 IMPLANT
CATH THORACIC 28FR (CATHETERS) ×6 IMPLANT
CATH THORACIC 36FR (CATHETERS) ×3 IMPLANT
CATH THORACIC 36FR RT ANG (CATHETERS) ×3 IMPLANT
CLIP TI WIDE RED SMALL 24 (CLIP) ×9 IMPLANT
COVER SURGICAL LIGHT HANDLE (MISCELLANEOUS) ×3 IMPLANT
CRADLE DONUT ADULT HEAD (MISCELLANEOUS) ×3 IMPLANT
DERMABOND ADVANCED (GAUZE/BANDAGES/DRESSINGS) ×1
DERMABOND ADVANCED .7 DNX12 (GAUZE/BANDAGES/DRESSINGS) ×2 IMPLANT
DRAPE CARDIOVASCULAR INCISE (DRAPES) ×3
DRAPE SLUSH/WARMER DISC (DRAPES) ×3 IMPLANT
DRAPE SRG 135X102X78XABS (DRAPES) ×2 IMPLANT
DRSG COVADERM 4X14 (GAUZE/BANDAGES/DRESSINGS) ×3 IMPLANT
ELECT CAUTERY BLADE 6.4 (BLADE) ×3 IMPLANT
ELECT REM PT RETURN 9FT ADLT (ELECTROSURGICAL) ×6
ELECTRODE REM PT RTRN 9FT ADLT (ELECTROSURGICAL) ×4 IMPLANT
FELT TEFLON 1X6 (MISCELLANEOUS) ×3 IMPLANT
GAUZE SPONGE 4X4 12PLY STRL (GAUZE/BANDAGES/DRESSINGS) ×6 IMPLANT
GLOVE BIO SURGEON STRL SZ 6 (GLOVE) ×12 IMPLANT
GLOVE BIO SURGEON STRL SZ 6.5 (GLOVE) ×24 IMPLANT
GLOVE BIO SURGEON STRL SZ7.5 (GLOVE) IMPLANT
GLOVE BIOGEL PI IND STRL 6 (GLOVE) ×8 IMPLANT
GLOVE BIOGEL PI IND STRL 6.5 (GLOVE) ×2 IMPLANT
GLOVE BIOGEL PI INDICATOR 6 (GLOVE) ×4
GLOVE BIOGEL PI INDICATOR 6.5 (GLOVE) ×1
GLOVE EUDERMIC 7 POWDERFREE (GLOVE) ×6 IMPLANT
GOWN STRL REUS W/ TWL LRG LVL3 (GOWN DISPOSABLE) ×8 IMPLANT
GOWN STRL REUS W/ TWL XL LVL3 (GOWN DISPOSABLE) ×2 IMPLANT
GOWN STRL REUS W/TWL LRG LVL3 (GOWN DISPOSABLE) ×12
GOWN STRL REUS W/TWL XL LVL3 (GOWN DISPOSABLE) ×3
HEMOSTAT POWDER SURGIFOAM 1G (HEMOSTASIS) ×9 IMPLANT
HEMOSTAT SURGICEL 2X14 (HEMOSTASIS) ×3 IMPLANT
KIT BASIN OR (CUSTOM PROCEDURE TRAY) ×3 IMPLANT
KIT CATH CPB BARTLE (MISCELLANEOUS) ×3 IMPLANT
KIT ROOM TURNOVER OR (KITS) ×3 IMPLANT
KIT SUCTION CATH 14FR (SUCTIONS) ×3 IMPLANT
KIT VASOVIEW 6 PRO VH 2400 (KITS) ×3 IMPLANT
NS IRRIG 1000ML POUR BTL (IV SOLUTION) ×15 IMPLANT
PACK OPEN HEART (CUSTOM PROCEDURE TRAY) ×3 IMPLANT
PAD ARMBOARD 7.5X6 YLW CONV (MISCELLANEOUS) ×6 IMPLANT
PAD ELECT DEFIB RADIOL ZOLL (MISCELLANEOUS) ×3 IMPLANT
PENCIL BUTTON HOLSTER BLD 10FT (ELECTRODE) ×3 IMPLANT
PUNCH AORTIC ROT 4.0MM RCL 40 (MISCELLANEOUS) ×3 IMPLANT
PUNCH AORTIC ROTATE 4.5MM 8IN (MISCELLANEOUS) ×3 IMPLANT
SEALANT SURG COSEAL 4ML (VASCULAR PRODUCTS) ×3 IMPLANT
SET CARDIOPLEGIA MPS 5001102 (MISCELLANEOUS) ×3 IMPLANT
SOLUTION ANTI FOG 6CC (MISCELLANEOUS) ×3 IMPLANT
SPONGE GAUZE 4X4 12PLY STER LF (GAUZE/BANDAGES/DRESSINGS) ×6 IMPLANT
SPONGE LAP 4X18 X RAY DECT (DISPOSABLE) ×3 IMPLANT
SUT BONE WAX W31G (SUTURE) ×3 IMPLANT
SUT ETHIBOND 2 0 SH (SUTURE) ×12
SUT ETHIBOND 2 0 SH 36X2 (SUTURE) ×8 IMPLANT
SUT MNCRL AB 4-0 PS2 18 (SUTURE) ×3 IMPLANT
SUT PROLENE 3 0 SH1 36 (SUTURE) ×3 IMPLANT
SUT PROLENE 4 0 RB 1 (SUTURE) ×3
SUT PROLENE 4-0 RB1 .5 CRCL 36 (SUTURE) ×2 IMPLANT
SUT PROLENE 5 0 C 1 36 (SUTURE) ×6 IMPLANT
SUT PROLENE 6 0 C 1 30 (SUTURE) ×6 IMPLANT
SUT PROLENE 7 0 BV1 MDA (SUTURE) ×3 IMPLANT
SUT PROLENE 8 0 BV175 6 (SUTURE) ×9 IMPLANT
SUT SILK  1 MH (SUTURE) ×4
SUT SILK 1 MH (SUTURE) ×8 IMPLANT
SUT SILK 1 TIES 10X30 (SUTURE) ×3 IMPLANT
SUT SILK 2 0 SH CR/8 (SUTURE) ×6 IMPLANT
SUT SILK 2 0 TIES 10X30 (SUTURE) ×3 IMPLANT
SUT SILK 2 0 TIES 17X18 (SUTURE) ×3
SUT SILK 2-0 18XBRD TIE BLK (SUTURE) ×2 IMPLANT
SUT SILK 3 0 SH CR/8 (SUTURE) ×3 IMPLANT
SUT SILK 4 0 TIE 10X30 (SUTURE) ×6 IMPLANT
SUT STEEL STERNAL CCS#1 18IN (SUTURE) ×6 IMPLANT
SUT TEM PAC WIRE 2 0 SH (SUTURE) ×12 IMPLANT
SUT VIC AB 1 CTX 36 (SUTURE) ×6
SUT VIC AB 1 CTX36XBRD ANBCTR (SUTURE) ×4 IMPLANT
SUT VIC AB 2-0 CT1 27 (SUTURE) ×3
SUT VIC AB 2-0 CT1 TAPERPNT 27 (SUTURE) ×2 IMPLANT
SUT VIC AB 2-0 CTX 27 (SUTURE) ×6 IMPLANT
SUT VIC AB 3-0 X1 27 (SUTURE) ×6 IMPLANT
SUTURE E-PAK OPEN HEART (SUTURE) ×3 IMPLANT
SYSTEM SAHARA CHEST DRAIN ATS (WOUND CARE) ×3 IMPLANT
TAPE CLOTH SURG 4X10 WHT LF (GAUZE/BANDAGES/DRESSINGS) ×3 IMPLANT
TAPE PAPER MEDFIX 1IN X 10YD (GAUZE/BANDAGES/DRESSINGS) ×3 IMPLANT
TOWEL OR 17X24 6PK STRL BLUE (TOWEL DISPOSABLE) ×3 IMPLANT
TOWEL OR 17X26 10 PK STRL BLUE (TOWEL DISPOSABLE) ×3 IMPLANT
TRAY FOLEY IC TEMP SENS 16FR (CATHETERS) ×3 IMPLANT
TUBING INSUFFLATION (TUBING) ×3 IMPLANT
UNDERPAD 30X30 INCONTINENT (UNDERPADS AND DIAPERS) ×3 IMPLANT
WATER STERILE IRR 1000ML POUR (IV SOLUTION) ×6 IMPLANT

## 2016-04-22 NOTE — Progress Notes (Signed)
Dr. Laneta SimmersBartle paged and spoke with regarding blood gas results, chest tube drainage, and lab values. Orders received to proceed with extubation. Will continue to monitor closely. Modena JanskyKevin Ezra Denne RN [facility]

## 2016-04-22 NOTE — OR Nursing (Signed)
2nd call made to SICU @1433 , providing a patient update (plans for transfer to Room 1 postoperatively). 3rd call made to SICU @1441 , providing a patient update to charge nurse 4th call made to SICU @1512 .

## 2016-04-22 NOTE — Progress Notes (Signed)
Pt did not tolerate wean at this time. Placed back on full SIMV vent support.

## 2016-04-22 NOTE — Op Note (Signed)
CARDIOVASCULAR SURGERY OPERATIVE NOTE  04/22/2016  Surgeon:  Alleen BorneBryan K. Bartle, MD  First Assistant: Lowella DandyErin Barrett,  PA-C   Preoperative Diagnosis:  Severe multi-vessel coronary artery disease   Postoperative Diagnosis:  Same   Procedure:  1. Median Sternotomy 2. Extracorporeal circulation 3.   Coronary artery bypass grafting x 3   Left internal mammary graft to the OM1  SVG to OM2  Free Right internal mammary graft to the RCA  4.   Endoscopic vein harvest from the right leg   Anesthesia:  General Endotracheal   Clinical History/Surgical Indication:  The patient is a 70 year old gentleman with a long history of diabetes, hyperlipidemia intolerant to statin therapy, strong family history of coronary artery disease and remote smoking until 2010 when he suffered an MI and had stenting of his proximal LAD by Dr. Juanda ChanceBrodie. He was recently evaluated by Dr. Allyson SabalBerry for a history of occasional chest pain with exertion. He had a stress Myoview exam that was intermediate risk with inferior and lateral ischemia. Cath on 03/28/2016 showed severe multi-vessel CAD with 95% ostial LCX stenosis, 70% mid LCX stenosis, 70% proximal and mid RCA stenosis and a patent stent in the proximal LAD with mild luminal irregularity within the stent but no significant stenosis.  He has severe multi-vessel coronary artery disease with a 95% ostial LCX lesion that is likely the culprit for his chest pain. I agree that CABG is the best treatment for this gentleman. His LAD has widely patent proximal stents and does not require grafting. I discussed the operative procedure with the patient and his wife including alternatives, benefits and risks; including but not limited to bleeding, blood transfusion, infection, stroke, myocardial infarction, graft failure, heart block requiring a permanent pacemaker, organ dysfunction, and  death. Derrick [name] understands and agrees to proceed.   Preparation:  The patient was seen in the preoperative holding area and the correct patient, correct operation were confirmed with the patient after reviewing the medical record and catheterization. The consent was signed by me. Preoperative antibiotics were given. A pulmonary arterial line and radial arterial line were placed by the anesthesia team. The patient was taken back to the operating room and positioned supine on the operating room table. After being placed under general endotracheal anesthesia by the anesthesia team a foley catheter was placed. The neck, chest, abdomen, and both legs were prepped with betadine soap and solution and draped in the usual sterile manner. A surgical time-out was taken and the correct patient and operative procedure were confirmed with the nursing and anesthesia staff.   Cardiopulmonary Bypass:  A median sternotomy was performed. The pericardium was opened in the midline. Right ventricular function appeared normal. The ascending aorta was of normal size and had no palpable plaque. There were no contraindications to aortic cannulation or cross-clamping. The patient was fully systemically heparinized and the ACT was maintained > 400 sec. The proximal aortic arch was cannulated with a 20 F aortic cannula for arterial inflow. Venous cannulation was performed via the right atrial appendage using a two-staged venous cannula. An antegrade cardioplegia/vent cannula was inserted into the mid-ascending aorta. Aortic occlusion was performed with a single cross-clamp. Systemic cooling to 32 degrees Centigrade and topical cooling of the heart with iced saline were used. Hyperkalemic antegrade cold blood cardioplegia was used to induce diastolic arrest and was then given at about 20 minute intervals throughout the period of arrest to maintain myocardial temperature at or below 10 degrees centigrade. A temperature  probe  was inserted into the interventricular septum and an insulating pad was placed in the pericardium.    Right internal mammary harvest:   The rightt side of the sternum was retracted using the Rultract retractor. The right internal mammary artery was harvested as a free graft since it was not long enough to reach the RCA as a pedicle graft. All side branches were clipped. It was a medium-sized vessel of good quality with excellent blood flow. It was ligated distally and divided. It was suture ligated with 2-0 silk proximally and divided.  It was sprayed with topical papaverine solution to prevent vasospasm.    Left internal mammary harvest:  Since only one section of the right leg vein was good I decided to use bilateral IMA grafts. The left side of the sternum was retracted using the Rultract retractor. The left internal mammary artery was harvested as a pedicle graft. All side branches were clipped. It was a medium-sized vessel of good quality with excellent blood flow. It was ligated distally and divided. It was sprayed with topical papaverine solution to prevent vasospasm.   Endoscopic vein harvest:  The right greater saphenous vein was harvested endoscopically through a 2 cm incision medial to the right knee. It was harvested from the upper thigh to below the knee. It was a medium-sized vein of good quality in the lower thigh but then became thicker and non-distensible in the upper thigh. The side branches were all ligated with 4-0 silk ties.    Coronary arteries:  The coronary arteries were examined.   LAD:  No stenosis on cath. Prior stents in the proximal vessel that were widely patent  LCX:  OM1 was diffusely diseased proximally and this continue up to the bifurcation into two smaller branches. The lateral branch was the larger of the two and was grafted. The OM2 was also diffusely diseased and I had to go out distally in the vessel to graft it and it was smaller there.  RCA:   Diffusely diseased extending out into the distal vessel.   Grafts:  1. LIMA to the OM1: 1.5 mm. It was sewn end to side using 8-0 prolene continuous suture. 2. SVG to OM2:  1.6 mm. It was sewn end to side using 8-0 prolene continuous suture. 3. Free RIMA to the RCA:  1.6 mm. It was sewn end to side using 8-0 prolene continuous suture.   The proximal vein graft and RIMA anastomoses were performed to the mid-ascending aorta using continuous 6-0 prolene suture for the vein and 7-0 prolene suture for the RIMA graft. Graft markers were placed around the proximal anastomoses.   Completion:  The patient was rewarmed to 37 degrees Centigrade. The clamp was removed from the LIMA pedicle. The crossclamp was removed with a time of 101 minutes. There was spontaneous return of sinus rhythm. The distal and proximal anastomoses were checked for hemostasis. The position of the grafts was satisfactory. Two temporary epicardial pacing wires were placed on the right atrium and two on the right ventricle. The patient was weaned from CPB without difficulty on no inotropes. CPB time was 120 minutes. Cardiac output was 5 LPM. Heparin was fully reversed with protamine and the aortic and venous cannulas removed. Hemostasis was achieved. Mediastinal and left pleural drainage tubes were placed. The sternum was closed with  #6 stainless steel wires. The fascia was closed with continuous # 1 vicryl suture. The subcutaneous tissue was closed with 2-0 vicryl continuous suture. The skin was closed  with 3-0 vicryl subcuticular suture. All sponge, needle, and instrument counts were reported correct at the end of the case. Dry sterile dressings were placed over the incisions and around the chest tubes which were connected to pleurevac suction. The patient was then transported to the surgical intensive care unit in critical but stable condition.

## 2016-04-22 NOTE — Progress Notes (Signed)
Informed RT ready to re attempt rapid wean.

## 2016-04-22 NOTE — OR Nursing (Signed)
First call to SICU charge nurse at 1417.

## 2016-04-22 NOTE — Brief Op Note (Signed)
04/22/2016  1:33 PM  PATIENT:  Derrick [name]  70 y.o. male  PRE-OPERATIVE DIAGNOSIS:  CAD  POST-OPERATIVE DIAGNOSIS:  CAD  PROCEDURE:  Procedure(s):  CORONARY ARTERY BYPASS GRAFTING x 3 LIMA to OM1 SVG to OM2 FREE RIMA to RCA  ENDOSCOPIC HARVEST GREATER SAPHENOUS VEIN -Right Thigh  TRANSESOPHAGEAL ECHOCARDIOGRAM (TEE) (N/A)  SURGEON:  Surgeon(s) and Role:    * Alleen BorneBryan K Bartle, MD - Primary  PHYSICIAN ASSISTANT: Lowella DandyErin Elsye Mccollister PA-C  ANESTHESIA:   general  EBL:  Total I/O In: -  Out: 650 [Urine:650]  BLOOD ADMINISTERED:CELLSAVER  DRAINS: Left Pleural Chest Tube, Mediastinal Chest drain   LOCAL MEDICATIONS USED:  NONE  SPECIMEN:  No Specimen  DISPOSITION OF SPECIMEN:  N/A  COUNTS:  YES  TOURNIQUET:  * No tourniquets in log *  DICTATION: .Dragon Dictation  PLAN OF CARE: Admit to inpatient   PATIENT DISPOSITION:  ICU - intubated and hemodynamically stable.   Delay start of Pharmacological VTE agent (>24hrs) due to surgical blood loss or risk of bleeding: yes

## 2016-04-22 NOTE — Procedures (Signed)
Extubation Procedure Note  Patient Details:   Name: Derrick RedderJerry E [name] DOB: 1946/08/10 MRN: [id number]   Airway Documentation:     Evaluation  O2 sats: stable throughout Complications: No apparent complications Patient did tolerate procedure well. Bilateral Breath Sounds: Clear, Diminished   Yes, Pt. able to lift/hold head off bed with pillow removed on command, pulled -30 cmh20 with NIF, 1058cc on FVC, mouth sx.'d prior, no Stridor heard s/p Extubation, tolerating well, placed on 2 lpm humidified N/C, I/S started, RT/RN to monitor.  Joylene JohnSweeney, Haddon Fyfe Mitchell 04/22/2016, 9:26 PM

## 2016-04-22 NOTE — Progress Notes (Signed)
Patient ID: Derrick RedderJerry E Kitchens, male   DOB: 12/28/1945, 70 y.o.   MRN: [id number]   SICU Evening Rounds:   Hemodynamically stable  CI = 1.6  Still asleep on vent  Urine output good  CT output low  CBC    Component Value Date/Time   WBC 6.5 04/22/2016 1530   RBC 3.38* 04/22/2016 1530   HGB 9.6* 04/22/2016 1530   HCT 28.9* 04/22/2016 1530   PLT 123* 04/22/2016 1530   MCV 85.5 04/22/2016 1530   MCH 28.4 04/22/2016 1530   MCHC 33.2 04/22/2016 1530   RDW 13.2 04/22/2016 1530   LYMPHSABS 1508 03/23/2016 0849   MONOABS 406 03/23/2016 0849   EOSABS 174 03/23/2016 0849   BASOSABS 58 03/23/2016 0849     BMET    Component Value Date/Time   NA 136 04/19/2016 1158   K 4.2 04/19/2016 1158   CL 105 04/19/2016 1158   CO2 21* 04/19/2016 1158   GLUCOSE 164* 04/19/2016 1158   BUN 16 04/19/2016 1158   CREATININE 1.51* 04/19/2016 1158   CALCIUM 8.7* 04/19/2016 1158   GFRNONAA 45* 04/19/2016 1158   GFRAA 52* 04/19/2016 1158     A/P:  Stable postop course. Continue current plans

## 2016-04-22 NOTE — Transfer of Care (Signed)
Immediate Anesthesia Transfer of Care Note  Patient: Derrick [name]  Procedure(s) Performed: Procedure(s): CORONARY ARTERY BYPASS GRAFTING (CABG) LIMA to OM1 SVG to OM2 FREE RIMA to RCA  ENDOSCOPIC HARVEST GREATER SAPHENOUS VEIN -Right Thigh (N/A) TRANSESOPHAGEAL ECHOCARDIOGRAM (TEE) (N/A)  Patient Location: SICU  Anesthesia Type:General  Level of Consciousness: sedated  Airway & Oxygen Therapy: Patient remains intubated per anesthesia plan and Patient placed on Ventilator (see vital sign flow sheet for setting)  Post-op Assessment: Report given to RN and Post -op Vital signs reviewed and stable  Post vital signs: Reviewed and stable  Last Vitals:  Filed Vitals:   04/22/16 0708  BP: 157/75  Pulse: 57  Temp: 36.5 C  Resp: 18   HR 78, BP 114/76, sats 100% placed on ventilator by RT  Last Pain: There were no vitals filed for this visit.       Complications: No apparent anesthesia complications

## 2016-04-22 NOTE — Progress Notes (Signed)
  Echocardiogram Echocardiogram Transesophageal has been performed.  Arvil ChacoFoster, Makoto Sellitto 04/22/2016, 10:09 AM

## 2016-04-22 NOTE — Interval H&P Note (Signed)
History and Physical Interval Note:  04/22/2016 5:15 AM  Derrick [name]  has presented today for surgery, with the diagnosis of CAD  The various methods of treatment have been discussed with the patient and family. After consideration of risks, benefits and other options for treatment, the patient has consented to  Procedure(s): CORONARY ARTERY BYPASS GRAFTING (CABG) (N/A) TRANSESOPHAGEAL ECHOCARDIOGRAM (TEE) (N/A) as a surgical intervention .  The patient's history has been reviewed, patient examined, no change in status, stable for surgery.  I have reviewed the patient's chart and labs.  Questions were answered to the patient's satisfaction.     Alleen BorneBryan K Bartle

## 2016-04-22 NOTE — Progress Notes (Signed)
Dr. Laneta SimmersBartle paged and read results of repeat CBC and informed of further chest tube drainage. No orders given at this time. Continue with current plan. Will continue to monitor closely.  Modena JanskyKevin Daimon Kean RN [address] [facility]

## 2016-04-22 NOTE — H&P (Signed)
[address]       [place] [address]             [phone]      Cardiothoracic Surgery History and Physical    PCP is Horatio Pel, MD Referring Provider is Lorretta Harp, MD  Chief Complaint  Patient presents with  . Coronary Artery Disease    .Marland KitchenMarland KitchenCATH 03/28/16, STRESS and ECHO 02/23/16    HPI:  The patient is a 70 year old gentleman with a long history of diabetes, hyperlipidemia intolerant to statin therapy, strong family history of coronary artery disease and remote smoking until 2010 when he suffered an MI and had stenting of his proximal LAD by Dr. Olevia Perches. He was recently evaluated by Dr. Gwenlyn Found for a history of occasional chest pain with exertion. He had a stress Myoview exam that was intermediate risk with inferior and lateral ischemia. Cath on 03/28/2016 showed severe multi-vessel CAD with 95% ostial LCX stenosis, 70% mid LCX stenosis, 70% proximal and mid RCA stenosis and a patent stent in the proximal LAD with mild luminal irregularity within the stent but no significant stenosis. He says that he has not had any chest pain in several weeks.   Past Medical History  Diagnosis Date  . Hyperlipidemia   . COPD (chronic obstructive pulmonary disease) ([place])   . Diabetes mellitus   . Coronary atherosclerosis of native coronary vessel     S/P NSTEMI 04/11/2009--DEL stent to LAD. Nuclear stress test 11/2010 NEG, EF normal  . Diabetic peripheral neuropathy (HCC)     Painful; 1 vicodin bid very helpful (pt resistant to alternative tx's)  . Early cataracts, bilateral 04/24/12    [name] in [place], [place]  . Herpes zoster     Two separate outbreaks--one on left axillary/back region, one on right axillary/back region  . Chronic renal insufficiency, stage III (moderate) 2013    CrCl est high 50s (borderline stage II/III   . Mitral regurgitation 08/2013    Moderate (echo)  .  Peripheral arterial disease [facility])     Past Surgical History  Procedure Laterality Date  . Spine surgery      L-Spine 2000, C-Spine 2004  . Cryotherapy to ak lesion on nose  2/11  . Colonoscopy  12/2010    Normal screening colonoscopy; rpt 10 yrs  . Eye exam,diabetic  02/2011    Normal diabetic retinopathy screening exam at [facility] in [place], [place].  . Cardiovascular stress test  11/2010    No ischemia/normal EF  . Coronary stent placement  04/2009    DES to LAD  . Transthoracic echocardiogram  08/2013    LVH, EF 55-65%, no WMA. Moderate mitral regurg  . Carpal tunnel release Left 05/28/2015    Procedure: LEFT HAND CARPAL TUNNEL RELEASE; Surgeon: Iran Planas, MD; Location: [facility]; Service: Orthopedics; Laterality: Left;  . Cardiac catheterization N/A 03/28/2016    Procedure: Left Heart Cath and Coronary Angiography; Surgeon: Lorretta Harp, MD; Location: [facility]; Service: Cardiovascular; Laterality: N/A;  . Peripheral vascular catheterization N/A 03/28/2016    Procedure: Abdominal Aortogram; Surgeon: Lorretta Harp, MD; Location: [facility]; Service: Cardiovascular; Laterality: N/A;    Family History  Problem Relation Age of Onset  . Coronary artery disease      family hx of  . Cancer Mother     brain  . Diabetes Father   . Heart disease Father   .  Diabetes Brother   . Heart disease Brother   . Diabetes Son   . Hyperlipidemia Son     Social History Social History  Substance Use Topics  . Smoking status: Former Smoker -- 1.00 packs/day for 50 years    Types: Cigarettes    Quit date: 01/06/2011  . Smokeless tobacco: Never Used     Comment: Counseled to remain smoke free  . Alcohol Use: No    Current Outpatient Prescriptions  Medication Sig Dispense Refill  . aspirin 81  MG EC tablet Take 81 mg by mouth daily.     . Blood Glucose Monitoring Suppl (ONE TOUCH ULTRA SYSTEM KIT) W/DEVICE KIT Patient must check sugar TID 1 each 0  . glucose blood (ONE TOUCH ULTRA TEST) test strip USE ONE STRIP TO CHECK GLUCOSE 4 TIMES DAILY AS NEEDED 100 each 12  . HYDROcodone-acetaminophen (NORCO/VICODIN) 5-325 MG tablet Take 1 tablet by mouth every 6 (six) hours as needed for moderate pain. 30 tablet 0  . Insulin Human (INSULIN PUMP) SOLN Inject 1 each into the skin continuous. 2.7 small, 5.5 medium, 8.5 large meal settings    . lisinopril (PRINIVIL,ZESTRIL) 2.5 MG tablet TAKE ONE TABLET BY MOUTH ONCE DAILY 90 tablet 0  . metoprolol tartrate (LOPRESSOR) 25 MG tablet Take 1 tablet (25 mg total) by mouth 2 (two) times daily. 90 tablet 1  . nitroGLYCERIN (NITROSTAT) 0.4 MG SL tablet Place 1 tablet (0.4 mg total) under the tongue every 5 (five) minutes as needed. (Patient taking differently: Place 0.4 mg under the tongue every 5 (five) minutes as needed for chest pain. ) 20 tablet 1  . omeprazole (PRILOSEC) 20 MG capsule TAKE ONE CAPSULE BY MOUTH ONCE DAILY IN THE MORNING (Patient taking differently: TAKE ONE CAPSULE (20 mg) BY MOUTH ONCE DAILY IN THE MORNING) 90 capsule 3  . ONETOUCH DELICA LANCETS 95K MISC USE ONE 4 TIMES DAILY AS NEEDED 100 each 0  . research study medication Take 1 each by mouth every morning. Study drug for cholesterol from Dr. Deland Pretty     No current facility-administered medications for this visit.    Allergies  Allergen Reactions  . Atorvastatin     Muscle cramps   . Lovastatin     Muscle cramps  . Simvastatin Other (See Comments)    Muscle pain  . Pravastatin Other (See Comments)    Muscle cramps    Review of Systems  Constitutional: Positive for activity change and fatigue.  HENT: Negative.   Dentures  Eyes:   Glasses  Respiratory: Negative for  shortness of breath.  Cardiovascular: Positive for chest pain.  Gastrointestinal: Negative.  Endocrine: Negative.  Genitourinary: Negative.  Musculoskeletal: Negative.  Skin: Negative.  Allergic/Immunologic: Negative.  Neurological: Negative.  Hematological: Negative.  Psychiatric/Behavioral: Negative.    BP 129/70 mmHg  Pulse 62  Resp 16  Ht _0  (1.676 m)  Wt 140 lb (63.504 kg)  BMI 22.61 kg/m2  SpO2 97% Physical Exam  Constitutional: He is oriented to person, place, and time. He appears well-developed and well-nourished. No distress.  HENT:  Head: Normocephalic and atraumatic.  Mouth/Throat: Oropharynx is clear and moist.  Eyes: EOM are normal. Pupils are equal, round, and reactive to light.  Neck: Normal range of motion. Neck supple. No thyromegaly present.  Cardiovascular: Normal rate, regular rhythm, normal heart sounds and intact distal pulses.  No murmur heard. Pulmonary/Chest: Effort normal and breath sounds normal. No respiratory distress.  Abdominal: Soft. Bowel sounds are normal. He  exhibits no distension and no mass. There is no tenderness.  Musculoskeletal: Normal range of motion. He exhibits no edema.  Lymphadenopathy:   He has no cervical adenopathy.  Neurological: He is alert and oriented to person, place, and time. He has normal strength. No cranial nerve deficit or sensory deficit.  Skin: Skin is warm and dry.  Psychiatric: He has a normal mood and affect.     Diagnostic Tests:  Lorretta Harp, MD (Primary)      Procedures    Abdominal Aortogram   Left Heart Cath and Coronary Angiography    Conclusion     Prox RCA to Mid RCA lesion, 70% stenosed.  Ost LAD to Prox LAD lesion, 10% stenosed. The lesion was previously treated with a stent (unknown type).  Prox Cx to Mid Cx lesion, 70% stenosed.  Ost Cx lesion, 95% stenosed.  SYLVAN SOOKDEO is a 70 y.o. male   [id number] LOCATION: FACILITY: [facility]   PHYSICIAN: Quay Burow, M.D. [birth year]   DATE OF PROCEDURE: 03/28/2016  DATE OF DISCHARGE:     CARDIAC CATHETERIZATION     History obtained from chart review. Mr. Borcherding is a very pleasant 70 year old thin and fit-appearing married Caucasian male father of 32, grandfather to 3 grandchildren who is accompanied by his wife [name]. He was referred by Dr. Shelia Media for cardiovascular evaluation because of a prior history of coronary stenting. Factors include treated diabetes and hyperlipidemia intolerant to statin therapy. He smoked 45 pack years and quit at the time of his myocardial infarction in 2010. He does have a family history of heart disease with a father and brother both of whom had coronary artery bypass grafting. He'll myocardial infarction back in 2010 and had stenting performed by Dr. Olevia Perches. He does get occasional atypical chest pain every other month. There also is a question of moderate mitral regurgitation. A 2-D echocardiogram showed normal LV function with mild MR. The Myoview stress test which was read as intermediate risk with inferior and lateral ischemia. Based on this, the patient was referred for cardiac catheterization to define his anatomy. It should also be noted the patient has been having right hip pain thought to be arthritic for many years.     IMPRESSION:Mr. Baig has a patent proximal LAD stent. He is a 95% true ostial circumflex stenosis with a 70% AV groove stenosis and a long proximal and mid segmental 70% RCA stenosis The catheter didn't damp when the RCA ostium was engaged. It addition, he has a proximal total right common iliac artery occlusion reconstituting in the right common femoral artery and a 60% proximal left common iliac artery stenosis with a 60 mm pullback gradient using a 5 [place] internal catheter after administering administering 200 mg of intra-arterial nitroglycerin via the SideArm sheath. This is physiologically significant. I believe his  coronary disease is best treated with coronary artery bypass grafting given the ostial nature of his circumflex stenosis. His symptoms are fairly infrequent. I'm going to discharge him home as outpatient and will have him evaluated by the [facility] for consideration of CABG. I will address his peripheral arterial disease in a staged fashion after his coronary arteries are revascularized. This was removed and pressure held on the groin to achieve hemostasis. The patient left the lab in stable condition.  Quay Burow. MD, [facility] 03/28/2016 8:56 AM        Indications    Coronary artery disease due to lipid rich plaque [I25.10 (ICD-10-CM)]   Abnormal nuclear stress test [  R93.1 (ICD-10-CM)]   Claudication ([place]) [I73.9 (ICD-10-CM)]    Technique and Indications    PROCEDURE DESCRIPTION:   The patient was brought to the second floor [facility] lab in the postabsorptive state. He was  premedicated with valium 5 mg by mouth, IV Versed and fentanyl. His left groinwas prepped and shaved in usual sterile fashion. Xylocaine 1% was used for local anesthesia. A 5 French sheath was inserted into the eft common femoral artery using standard Seldinger technique. 5 French right and left Judkins diagnostic catheters as well as a 5 French pigtail catheter were used for selective coronary angiography, subselective left internal mammary artery angiography and distal abdominal aortography.Left heart pressures were also obtained. Isovue dye was used for the entirety of the case (60 mL to the patient). Retrograde aortic, left ventricular and pullback pressures were recorded.Estimated blood loss <50 mL. There were no immediate complications during the procedure.    Coronary Findings    Dominance: Right   Left Anterior Descending   . Ost LAD to Prox LAD lesion, 10% stenosed. The lesion was previously treated with a stent (unknown type).     Left Circumflex   . Ost Cx  lesion, 95% stenosed.   . Prox Cx to Mid Cx lesion, 70% stenosed.     Right Coronary Artery   . Prox RCA to Mid RCA lesion, 70% stenosed.      Coronary Diagrams    Diagnostic Diagram            Implants    No implant documentation for this case.    PACS Images    Show images for Cardiac catheterization     Link to Procedure Log    Procedure Log      Hemo Data       Most Recent Value   AO Systolic Pressure  262 mmHg   AO Diastolic Pressure  57 mmHg   AO Mean  83 mmHg   LV Systolic Pressure  035 mmHg   LV Diastolic Pressure  4 mmHg   LV EDP  12 mmHg   Arterial Occlusion Pressure Extended Systolic Pressure  597 mmHg   Arterial Occlusion Pressure Extended Diastolic Pressure  59 mmHg   Arterial Occlusion Pressure Extended Mean Pressure  85 mmHg   Left Ventricular Apex Extended Systolic Pressure  416 mmHg   Left Ventricular Apex Extended Diastolic Pressure  5 mmHg   Left Ventricular Apex Extended EDP Pressure  18 mmHg       Impression:  He has severe multi-vessel coronary artery disease with a 95% ostial LCX lesion that is likely the culprit for his chest pain. I agree that CABG is the best treatment for this gentleman. His LAD has widely patent proximal stents and does not require grafting. I discussed the operative procedure with the patient and his wife including alternatives, benefits and risks; including but not limited to bleeding, blood transfusion, infection, stroke, myocardial infarction, graft failure, heart block requiring a permanent pacemaker, organ dysfunction, and death. [name] understands and agrees to proceed.   Plan:  CABG on 04/21/2016  Gaye Pollack, MD [facility] [phone]

## 2016-04-22 NOTE — Anesthesia Preprocedure Evaluation (Signed)
Anesthesia Evaluation  Patient identified by MRN, date of birth, ID band Patient awake    Reviewed: Allergy & Precautions, NPO status , Patient's Chart, lab work & pertinent test results  History of Anesthesia Complications Negative for: history of anesthetic complications  Airway Mallampati: III  TM Distance: >3 FB Neck ROM: Full    Dental  (+) Edentulous Upper, Edentulous Lower   Pulmonary former smoker,    breath sounds clear to auscultation       Cardiovascular hypertension, Pt. on medications and Pt. on home beta blockers + angina + CAD, + Past MI, + Cardiac Stents and + Peripheral Vascular Disease  + Valvular Problems/Murmurs MR  Rhythm:Regular     Neuro/Psych  Neuromuscular disease negative psych ROS   GI/Hepatic Neg liver ROS, GERD  Controlled,  Endo/Other  diabetes, Type 2, Insulin Dependent  Renal/GU Renal InsufficiencyRenal disease     Musculoskeletal   Abdominal   Peds  Hematology  (+) anemia ,   Anesthesia Other Findings   Reproductive/Obstetrics                             Anesthesia Physical Anesthesia Plan  ASA: IV  Anesthesia Plan: General   Post-op Pain Management:    Induction: Intravenous  Airway Management Planned: Oral ETT  Additional Equipment: Arterial line, CVP, PA Cath, Ultrasound Guidance Line Placement and TEE  Intra-op Plan:   Post-operative Plan: Post-operative intubation/ventilation  Informed Consent: I have reviewed the patients History and Physical, chart, labs and discussed the procedure including the risks, benefits and alternatives for the proposed anesthesia with the patient or authorized representative who has indicated his/her understanding and acceptance.   Dental advisory given  Plan Discussed with: CRNA and Surgeon  Anesthesia Plan Comments:         Anesthesia Quick Evaluation

## 2016-04-22 NOTE — Anesthesia Procedure Notes (Addendum)
Central Venous Catheter Insertion Performed by: anesthesiologist Patient location: Pre-op. Preanesthetic checklist: patient identified, IV checked, site marked, risks and benefits discussed, surgical consent, monitors and equipment checked, pre-op evaluation, timeout performed and anesthesia consent Position: Trendelenburg Lidocaine 1% used for infiltration Landmarks identified and Seldinger technique used Catheter size: 8.5 Fr Central line and PA cath was placed.Sheath introducer Swan type and PA catheter depth:thermodilation and 46PA Cath depth:46 Procedure performed using ultrasound guided technique. Attempts: 1 Following insertion, line sutured and dressing applied. Post procedure assessment: blood return through all ports, free fluid flow and no air. Patient tolerated the procedure well with no immediate complications.   Procedure Name: Intubation Date/Time: 04/22/2016 9:46 AM Performed by: Glo HerringLEE, Giavonni Cizek B Pre-anesthesia Checklist: Patient identified, Emergency Drugs available, Timeout performed, Patient being monitored and Suction available Patient Re-evaluated:Patient Re-evaluated prior to inductionOxygen Delivery Method: Circle system utilized Preoxygenation: Pre-oxygenation with 100% oxygen Intubation Type: IV induction Ventilation: Mask ventilation without difficulty and Oral airway inserted - appropriate to patient size Laryngoscope Size: Mac and 3 Grade View: Grade I Tube type: Oral Tube size: 8.0 mm Number of attempts: 1 Airway Equipment and Method: Stylet Placement Confirmation: positive ETCO2,  CO2 detector,  ETT inserted through vocal cords under direct vision and breath sounds checked- equal and bilateral Secured at: 22 cm Tube secured with: Tape Dental Injury: Teeth and Oropharynx as per pre-operative assessment

## 2016-04-22 NOTE — Progress Notes (Signed)
Dr. Laneta SimmersBartle notified of increase chest tube drainage of 200cc over last hour.  Orders received.  Call results.

## 2016-04-23 ENCOUNTER — Inpatient Hospital Stay (HOSPITAL_COMMUNITY): Payer: Medicare Other

## 2016-04-23 LAB — GLUCOSE, CAPILLARY
GLUCOSE-CAPILLARY: 111 mg/dL — AB (ref 65–99)
GLUCOSE-CAPILLARY: 112 mg/dL — AB (ref 65–99)
GLUCOSE-CAPILLARY: 115 mg/dL — AB (ref 65–99)
GLUCOSE-CAPILLARY: 118 mg/dL — AB (ref 65–99)
GLUCOSE-CAPILLARY: 123 mg/dL — AB (ref 65–99)
GLUCOSE-CAPILLARY: 126 mg/dL — AB (ref 65–99)
GLUCOSE-CAPILLARY: 134 mg/dL — AB (ref 65–99)
GLUCOSE-CAPILLARY: 140 mg/dL — AB (ref 65–99)
GLUCOSE-CAPILLARY: 161 mg/dL — AB (ref 65–99)
Glucose-Capillary: 85 mg/dL (ref 65–99)
Glucose-Capillary: 157 mg/dL — ABNORMAL HIGH (ref 65–99)
Glucose-Capillary: 110 mg/dL — ABNORMAL HIGH (ref 65–99)
Glucose-Capillary: 118 mg/dL — ABNORMAL HIGH (ref 65–99)
Glucose-Capillary: 119 mg/dL — ABNORMAL HIGH (ref 65–99)
Glucose-Capillary: 124 mg/dL — ABNORMAL HIGH (ref 65–99)
Glucose-Capillary: 123 mg/dL — ABNORMAL HIGH (ref 65–99)
Glucose-Capillary: 121 mg/dL — ABNORMAL HIGH (ref 65–99)
Glucose-Capillary: 119 mg/dL — ABNORMAL HIGH (ref 65–99)
Glucose-Capillary: 121 mg/dL — ABNORMAL HIGH (ref 65–99)
Glucose-Capillary: 115 mg/dL — ABNORMAL HIGH (ref 65–99)
Glucose-Capillary: 105 mg/dL — ABNORMAL HIGH (ref 65–99)
Glucose-Capillary: 188 mg/dL — ABNORMAL HIGH (ref 65–99)

## 2016-04-23 LAB — CBC
HCT: 24.8 % — ABNORMAL LOW (ref 39.0–52.0)
HCT: 24.1 % — ABNORMAL LOW (ref 39.0–52.0)
HEMOGLOBIN: 8 g/dL — AB (ref 13.0–17.0)
Hemoglobin: 8.3 g/dL — ABNORMAL LOW (ref 13.0–17.0)
MCH: 28.8 pg (ref 26.0–34.0)
MCH: 28.9 pg (ref 26.0–34.0)
MCHC: 33.5 g/dL (ref 30.0–36.0)
MCHC: 33.2 g/dL (ref 30.0–36.0)
MCV: 86.1 fL (ref 78.0–100.0)
MCV: 87 fL (ref 78.0–100.0)
PLATELETS: 154 10*3/uL (ref 150–400)
PLATELETS: 138 10*3/uL — AB (ref 150–400)
RBC: 2.88 MIL/uL — ABNORMAL LOW (ref 4.22–5.81)
RBC: 2.77 MIL/uL — AB (ref 4.22–5.81)
RDW: 13.8 % (ref 11.5–15.5)
RDW: 14 % (ref 11.5–15.5)
WBC: 10 10*3/uL (ref 4.0–10.5)
WBC: 11.3 10*3/uL — AB (ref 4.0–10.5)

## 2016-04-23 LAB — BASIC METABOLIC PANEL
Anion gap: 9 (ref 5–15)
BUN: 13 mg/dL (ref 6–20)
CO2: 25 mmol/L (ref 22–32)
CREATININE: 1.43 mg/dL — AB (ref 0.61–1.24)
Calcium: 7.8 mg/dL — ABNORMAL LOW (ref 8.9–10.3)
Chloride: 106 mmol/L (ref 101–111)
GFR calc Af Amer: 56 mL/min — ABNORMAL LOW (ref >60)
GFR, EST NON AFRICAN AMERICAN: 48 mL/min — AB (ref >60)
Glucose, Bld: 116 mg/dL — ABNORMAL HIGH (ref 65–99)
Potassium: 4.5 mmol/L (ref 3.5–5.1)
SODIUM: 140 mmol/L (ref 135–145)

## 2016-04-23 LAB — POCT I-STAT, CHEM 8
BUN: 19 mg/dL (ref 6–20)
CALCIUM ION: 1.05 mmol/L — AB (ref 1.13–1.30)
CHLORIDE: 104 mmol/L (ref 101–111)
Creatinine, Ser: 1.7 mg/dL — ABNORMAL HIGH (ref 0.61–1.24)
GLUCOSE: 134 mg/dL — AB (ref 65–99)
HCT: 26 % — ABNORMAL LOW (ref 39.0–52.0)
Hemoglobin: 8.8 g/dL — ABNORMAL LOW (ref 13.0–17.0)
Potassium: 4.7 mmol/L (ref 3.5–5.1)
SODIUM: 139 mmol/L (ref 135–145)
TCO2: 22 mmol/L (ref 0–100)

## 2016-04-23 LAB — MAGNESIUM
MAGNESIUM: 3 mg/dL — AB (ref 1.7–2.4)
MAGNESIUM: 3 mg/dL — AB (ref 1.7–2.4)

## 2016-04-23 LAB — CREATININE, SERUM
CREATININE: 1.74 mg/dL — AB (ref 0.61–1.24)
GFR calc Af Amer: 44 mL/min — ABNORMAL LOW (ref >60)
GFR calc non Af Amer: 38 mL/min — ABNORMAL LOW (ref >60)

## 2016-04-23 MED ORDER — INSULIN ASPART 100 UNIT/ML ~~LOC~~ SOLN
0.0000 [IU] | SUBCUTANEOUS | Status: DC
  Administered 2016-04-23 – 2016-04-24 (×3): 4 [IU] via SUBCUTANEOUS
  Administered 2016-04-24: 8 [IU] via SUBCUTANEOUS
  Administered 2016-04-24: 4 [IU] via SUBCUTANEOUS
  Administered 2016-04-24: 2 [IU] via SUBCUTANEOUS
  Administered 2016-04-24 – 2016-04-25 (×2): 8 [IU] via SUBCUTANEOUS
  Administered 2016-04-25 – 2016-04-26 (×2): 2 [IU] via SUBCUTANEOUS
  Administered 2016-04-26: 4 [IU] via SUBCUTANEOUS

## 2016-04-23 MED ORDER — INSULIN DETEMIR 100 UNIT/ML ~~LOC~~ SOLN
20.0000 [IU] | Freq: Every day | SUBCUTANEOUS | Status: DC
  Administered 2016-04-23: 20 [IU] via SUBCUTANEOUS
  Filled 2016-04-23 (×2): qty 0.2

## 2016-04-23 MED ORDER — ENOXAPARIN SODIUM 40 MG/0.4ML ~~LOC~~ SOLN
40.0000 mg | Freq: Every day | SUBCUTANEOUS | Status: DC
  Administered 2016-04-23 – 2016-04-26 (×4): 40 mg via SUBCUTANEOUS
  Filled 2016-04-23 (×4): qty 0.4

## 2016-04-23 MED ORDER — INSULIN DETEMIR 100 UNIT/ML ~~LOC~~ SOLN: 20.0000 [IU] | Freq: Every day | SUBCUTANEOUS | Status: DC

## 2016-04-23 NOTE — Progress Notes (Signed)
Patient ID: Derrick RedderJerry E [name], male   DOB: 04-Sep-1946, 70 y.o.   MRN: [id number]  SICU Evening Rounds:  Complains of pain Hemodynamically stable  Off neo  Has started to wake up on vent. Moves all extremities to command   Urine output 25 cc/hr  CT output low.   CBC    Component Value Date/Time   WBC 11.3* 04/23/2016 1619   RBC 2.77* 04/23/2016 1619   HGB 8.0* 04/23/2016 1619   HCT 24.1* 04/23/2016 1619   PLT 138* 04/23/2016 1619   MCV 87.0 04/23/2016 1619   MCH 28.9 04/23/2016 1619   MCHC 33.2 04/23/2016 1619   RDW 14.0 04/23/2016 1619   LYMPHSABS 1508 03/23/2016 0849   MONOABS 406 03/23/2016 0849   EOSABS 174 03/23/2016 0849   BASOSABS 58 03/23/2016 0849     BMET    Component Value Date/Time   NA 139 04/23/2016 1617   K 4.7 04/23/2016 1617   CL 104 04/23/2016 1617   CO2 25 04/23/2016 0455   GLUCOSE 134* 04/23/2016 1617   BUN 19 04/23/2016 1617   CREATININE 1.74* 04/23/2016 1619   CALCIUM 7.8* 04/23/2016 0455   GFRNONAA 38* 04/23/2016 1619   GFRAA 44* 04/23/2016 1619     A/P:  Stable postop course. Remove chest tubes. Repeat labs in am. Continue current plans

## 2016-04-23 NOTE — Progress Notes (Signed)
Inpatient Diabetes Program Recommendations  AACE/ADA: New Consensus Statement on Inpatient Glycemic Control (2015)  Target Ranges:  Prepandial:   less than 140 mg/dL      Peak postprandial:   less than 180 mg/dL (1-2 hours)      Critically ill patients:  140 - 180 mg/dL   Noted patient 1 day post op. Patient has insulin pump at home. Recommend patient remain on IV insulin until he can manage his insulin pump closer to being transferred to Step Down per MD.  Thanks,  Christena DeemShannon Nygel Prokop RN, MSN, [facility] Inpatient Diabetes Coordinator Team Pager [phone] (8a-5p)

## 2016-04-23 NOTE — Progress Notes (Signed)
1 Day Post-Op Procedure(s) (LRB): CORONARY ARTERY BYPASS GRAFTING (CABG) LIMA to OM1 SVG to OM2 FREE RIMA to RCA  ENDOSCOPIC HARVEST GREATER SAPHENOUS VEIN -Right Thigh (N/A) TRANSESOPHAGEAL ECHOCARDIOGRAM (TEE) (N/A) Subjective:  No complaints  Objective: Vital signs in last 24 hours: Temp:  [96.3 F (35.7 C)-101.1 F (38.4 C)] 98.6 F (37 C) (05/20 0800) Pulse Rate:  [64-91] 90 (05/20 0800) Cardiac Rhythm:  [-] Normal sinus rhythm (05/20 0400) Resp:  [12-27] 15 (05/20 0800) BP: (72-156)/(40-100) 89/65 mmHg (05/20 0800) SpO2:  [94 %-100 %] 95 % (05/20 0800) Arterial Line BP: (85-177)/(49-93) 101/58 mmHg (05/20 0800) FiO2 (%):  [40 %-50 %] 40 % (05/19 2000) Weight:  [65.6 kg (144 lb 10 oz)] 65.6 kg (144 lb 10 oz) (05/20 0500)  Hemodynamic parameters for last 24 hours: PAP: (16-42)/(7-26) 35/19 mmHg CO:  [2.5 L/min-5 L/min] 3.8 L/min CI:  [1.5 L/min/m2-2.9 L/min/m2] 2.2 L/min/m2  Intake/Output from previous day: 05/19 0701 - 05/20 0700 In: 4632.9 [I.V.:2732.9; Blood:300; IV Piggyback:1600] Out: 4950 [Urine:3080; Blood:1000; Chest Tube:870] Intake/Output this shift: Total I/O In: 56.8 [I.V.:56.8] Out: 60 [Urine:30; Chest Tube:30]  General appearance: alert and cooperative Neurologic: intact Heart: regular rate and rhythm, S1, S2 normal, no murmur, click, rub or gallop Lungs: clear to auscultation bilaterally Extremities: edema mild Wound: dressing dry  Lab Results:  Recent Labs  04/22/16 2245 04/23/16 0455  WBC 9.1 10.0  HGB 8.9* 8.3*  HCT 26.7* 24.8*  PLT 154 154   BMET:  Recent Labs  04/22/16 2053 04/23/16 0455  NA 139 140  K 5.4* 4.5  CL 106 106  CO2  --  25  GLUCOSE 126* 116*  BUN 15 13  CREATININE 1.30* 1.43*  CALCIUM  --  7.8*    PT/INR:  Recent Labs  04/22/16 1530  LABPROT 17.9*  INR 1.47   ABG    Component Value Date/Time   PHART 7.343* 04/22/2016 2321   HCO3 23.0 04/22/2016 2321   TCO2 24 04/22/2016 2321   ACIDBASEDEF 2.0  04/22/2016 2321   O2SAT 97.0 04/22/2016 2321   CBG (last 3)   Recent Labs  04/22/16 0722 04/22/16 1642  GLUCAP 209* 85   CXR: clear  ECG: normal sinus. No acute changes  Assessment/Plan: S/P Procedure(s) (LRB): CORONARY ARTERY BYPASS GRAFTING (CABG) LIMA to OM1 SVG to OM2 FREE RIMA to RCA  ENDOSCOPIC HARVEST GREATER SAPHENOUS VEIN -Right Thigh (N/A) TRANSESOPHAGEAL ECHOCARDIOGRAM (TEE) (N/A)  He is hemodynamically stable but still on some neo for BP support. Wean as tolerated. Hold beta blocker for now.  Chest tube output serosanguinous and thin but still 30-40 cc per hour so will leave in for a while today and possibly remove later today.  Stage 3A chronic kidney disease with preop creat 1.51. Diurese once off neo  Mobilize, IS  Diabetes control: preop Hgb A1c was 7.4 so better long term control needed although it has decreased from 9.9 in 11/2014. Will start Levemir and SSI today and get off drip.  Continue foley due to patient in ICU and urinary output monitoring  See progression orders   LOS: 1 day    Alleen BorneBryan K Bartle 04/23/2016

## 2016-04-23 NOTE — Anesthesia Postprocedure Evaluation (Signed)
Anesthesia Post Note  Patient: Derrick [name]  Procedure(s) Performed: Procedure(s) (LRB): CORONARY ARTERY BYPASS GRAFTING (CABG) LIMA to OM1 SVG to OM2 FREE RIMA to RCA  ENDOSCOPIC HARVEST GREATER SAPHENOUS VEIN -Right Thigh (N/A) TRANSESOPHAGEAL ECHOCARDIOGRAM (TEE) (N/A)  Patient location during evaluation: ICU Anesthesia Type: General Level of consciousness: sedated Pain management: pain level controlled Vital Signs Assessment: post-procedure vital signs reviewed and stable Respiratory status: patient remains intubated per anesthesia plan Cardiovascular status: stable Postop Assessment: no signs of nausea or vomiting Anesthetic complications: no    Last Vitals:  Filed Vitals:   04/23/16 1400 04/23/16 1500  BP: 101/68 86/62  Pulse: 91 90  Temp:    Resp: 21 15    Last Pain:  Filed Vitals:   04/23/16 1556  PainSc: 3                  Kenesha Moshier

## 2016-04-24 ENCOUNTER — Inpatient Hospital Stay (HOSPITAL_COMMUNITY): Payer: Medicare Other

## 2016-04-24 LAB — CBC
HCT: 23.9 % — ABNORMAL LOW (ref 39.0–52.0)
HEMOGLOBIN: 7.8 g/dL — AB (ref 13.0–17.0)
MCH: 28.7 pg (ref 26.0–34.0)
MCHC: 32.6 g/dL (ref 30.0–36.0)
MCV: 87.9 fL (ref 78.0–100.0)
PLATELETS: 133 10*3/uL — AB (ref 150–400)
RBC: 2.72 MIL/uL — ABNORMAL LOW (ref 4.22–5.81)
RDW: 14.5 % (ref 11.5–15.5)
WBC: 9.2 10*3/uL (ref 4.0–10.5)

## 2016-04-24 LAB — BASIC METABOLIC PANEL
ANION GAP: 5 (ref 5–15)
BUN: 25 mg/dL — AB (ref 6–20)
CALCIUM: 7.7 mg/dL — AB (ref 8.9–10.3)
CO2: 26 mmol/L (ref 22–32)
CREATININE: 2.08 mg/dL — AB (ref 0.61–1.24)
Chloride: 106 mmol/L (ref 101–111)
GFR calc Af Amer: 35 mL/min — ABNORMAL LOW (ref >60)
GFR, EST NON AFRICAN AMERICAN: 31 mL/min — AB (ref >60)
GLUCOSE: 137 mg/dL — AB (ref 65–99)
Potassium: 4.8 mmol/L (ref 3.5–5.1)
Sodium: 137 mmol/L (ref 135–145)

## 2016-04-24 LAB — GLUCOSE, CAPILLARY
GLUCOSE-CAPILLARY: 194 mg/dL — AB (ref 65–99)
GLUCOSE-CAPILLARY: 202 mg/dL — AB (ref 65–99)
Glucose-Capillary: 129 mg/dL — ABNORMAL HIGH (ref 65–99)
Glucose-Capillary: 170 mg/dL — ABNORMAL HIGH (ref 65–99)
Glucose-Capillary: 195 mg/dL — ABNORMAL HIGH (ref 65–99)
Glucose-Capillary: 221 mg/dL — ABNORMAL HIGH (ref 65–99)

## 2016-04-24 MED ORDER — INSULIN DETEMIR 100 UNIT/ML ~~LOC~~ SOLN
25.0000 [IU] | Freq: Every day | SUBCUTANEOUS | Status: DC
  Administered 2016-04-24: 25 [IU] via SUBCUTANEOUS
  Filled 2016-04-24 (×2): qty 0.25

## 2016-04-24 MED ORDER — FERROUS GLUCONATE 324 (38 FE) MG PO TABS
324.0000 mg | ORAL_TABLET | Freq: Two times a day (BID) | ORAL | Status: DC
  Administered 2016-04-24: 324 mg via ORAL
  Filled 2016-04-24 (×2): qty 1

## 2016-04-24 MED ORDER — METOCLOPRAMIDE HCL 5 MG/ML IJ SOLN
5.0000 mg | Freq: Four times a day (QID) | INTRAMUSCULAR | Status: AC
  Administered 2016-04-24 – 2016-04-25 (×3): 5 mg via INTRAVENOUS
  Filled 2016-04-24 (×2): qty 2

## 2016-04-24 NOTE — Progress Notes (Signed)
Patient ID: Derrick RedderJerry E [name], male   DOB: 24-Sep-1946, 70 y.o.   MRN: [id number]  SICU Evening Rounds:  Hemodynamically stable today  Sinus 90's  Urine output ok  Some nausea again today. Took some liquids but no food yet.  Will continue some Reglan for now.

## 2016-04-24 NOTE — Progress Notes (Addendum)
2 Days Post-Op Procedure(s) (LRB): CORONARY ARTERY BYPASS GRAFTING (CABG) LIMA to OM1 SVG to OM2 FREE RIMA to RCA  ENDOSCOPIC HARVEST GREATER SAPHENOUS VEIN -Right Thigh (N/A) TRANSESOPHAGEAL ECHOCARDIOGRAM (TEE) (N/A) Subjective: Feels better today. No nausea. Pain better with tubes out   Objective: Vital signs in last 24 hours: Temp:  [98 F (36.7 C)-98.5 F (36.9 C)] 98.5 F (36.9 C) (05/21 0742) Pulse Rate:  [88-91] 90 (05/21 0830) Cardiac Rhythm:  [-] Atrial paced (05/21 0830) Resp:  [10-23] 16 (05/21 0830) BP: (79-114)/(59-81) 113/72 mmHg (05/21 0830) SpO2:  [90 %-98 %] 97 % (05/21 0830) Arterial Line BP: (93-114)/(48-61) 109/54 mmHg (05/20 1730) Weight:  [64 kg (141 lb 1.5 oz)] 64 kg (141 lb 1.5 oz) (05/21 0700)  Hemodynamic parameters for last 24 hours:    Intake/Output from previous day: 05/20 0701 - 05/21 0700 In: 1203.9 [P.O.:560; I.V.:593.9; IV Piggyback:50] Out: 1015 [Urine:605; Chest Tube:410] Intake/Output this shift: Total I/O In: 31 [I.V.:31] Out: -   General appearance: alert and cooperative Neurologic: intact Heart: regular rate and rhythm, S1, S2 normal, no murmur, click, rub or gallop Lungs: clear to auscultation bilaterally Abdomen: soft, non-tender; bowel sounds normal; no masses,  no organomegaly Extremities: extremities normal, atraumatic, no cyanosis or edema Wound: dressings dry  Lab Results:  Recent Labs  04/23/16 1619 04/24/16 0407  WBC 11.3* 9.2  HGB 8.0* 7.8*  HCT 24.1* 23.9*  PLT 138* 133*   BMET:  Recent Labs  04/23/16 0455 04/23/16 1617 04/23/16 1619 04/24/16 0407  NA 140 139  --  137  K 4.5 4.7  --  4.8  CL 106 104  --  106  CO2 25  --   --  26  GLUCOSE 116* 134*  --  137*  BUN 13 19  --  25*  CREATININE 1.43* 1.70* 1.74* 2.08*  CALCIUM 7.8*  --   --  7.7*    PT/INR:  Recent Labs  04/22/16 1530  LABPROT 17.9*  INR 1.47   ABG    Component Value Date/Time   PHART 7.343* 04/22/2016 2321   HCO3 23.0  04/22/2016 2321   TCO2 22 04/23/2016 1617   ACIDBASEDEF 2.0 04/22/2016 2321   O2SAT 97.0 04/22/2016 2321   CBG (last 3)   Recent Labs  04/24/16 0019 04/24/16 0456 04/24/16 0739  GLUCAP 202* 129* 170*   CLINICAL DATA: Status post CABG.  EXAM: PORTABLE CHEST 1 VIEW  COMPARISON: Apr 23, 2016  FINDINGS: The PA catheter has been removed and a right IJ sheath remains. The chest tubes have also been removed with no pneumothorax. The cardiomediastinal silhouette is stable. Mild pulmonary venous congestion has improved. No other acute abnormalities.  IMPRESSION: Removal of support apparatus as described above. Improving pulmonary venous congestion.   Electronically Signed  By: Gerome Sam III M.D  On: 04/24/2016 09:47  Assessment/Plan: S/P Procedure(s) (LRB): CORONARY ARTERY BYPASS GRAFTING (CABG) LIMA to OM1 SVG to OM2 FREE RIMA to RCA  ENDOSCOPIC HARVEST GREATER SAPHENOUS VEIN -Right Thigh (N/A) TRANSESOPHAGEAL ECHOCARDIOGRAM (TEE) (N/A)  He is hemodynamically stable but put back on neo overnight for drop of SBP into the 90's. Wean off. Continue to hold beta blocker.  DM: glucose 170 this am. Will increase Levemir to 25 units.  Stage 3A CKD: creat up to 2.0 this am. Urine output ok. Will follow. This should return to baseline quickly. Hold off on diuresis.  Acute blood loss anemia: Hgb stable from yesterday. Start iron and observe.  Continue IS and ambulation  LOS: 2 days    Alleen BorneBryan K Antwoin Lackey 04/24/2016

## 2016-04-24 NOTE — Progress Notes (Signed)
Utilization review completed.  

## 2016-04-25 LAB — POCT I-STAT 3, ART BLOOD GAS (G3+)
Acid-Base Excess: 1 mmol/L (ref 0.0–2.0)
Acid-Base Excess: 1 mmol/L (ref 0.0–2.0)
BICARBONATE: 26.6 meq/L — AB (ref 20.0–24.0)
Bicarbonate: 25.1 mEq/L — ABNORMAL HIGH (ref 20.0–24.0)
O2 SAT: 100 %
O2 Saturation: 100 %
PCO2 ART: 47.7 mmHg — AB (ref 35.0–45.0)
PCO2 ART: 36.5 mmHg (ref 35.0–45.0)
PH ART: 7.354 (ref 7.350–7.450)
PH ART: 7.441 (ref 7.350–7.450)
Patient temperature: 35.9
TCO2: 28 mmol/L (ref 0–100)
TCO2: 26 mmol/L (ref 0–100)
pO2, Arterial: 295 mmHg — ABNORMAL HIGH (ref 80.0–100.0)
pO2, Arterial: 172 mmHg — ABNORMAL HIGH (ref 80.0–100.0)

## 2016-04-25 LAB — GLUCOSE, CAPILLARY
GLUCOSE-CAPILLARY: 100 mg/dL — AB (ref 65–99)
GLUCOSE-CAPILLARY: 103 mg/dL — AB (ref 65–99)
GLUCOSE-CAPILLARY: 95 mg/dL (ref 65–99)
GLUCOSE-CAPILLARY: 134 mg/dL — AB (ref 65–99)
GLUCOSE-CAPILLARY: 203 mg/dL — AB (ref 65–99)
Glucose-Capillary: 102 mg/dL — ABNORMAL HIGH (ref 65–99)
Glucose-Capillary: 85 mg/dL (ref 65–99)

## 2016-04-25 LAB — POCT I-STAT, CHEM 8
BUN: 18 mg/dL (ref 6–20)
BUN: 16 mg/dL (ref 6–20)
BUN: 14 mg/dL (ref 6–20)
BUN: 16 mg/dL (ref 6–20)
BUN: 14 mg/dL (ref 6–20)
BUN: 15 mg/dL (ref 6–20)
CALCIUM ION: 1.25 mmol/L (ref 1.13–1.30)
CHLORIDE: 101 mmol/L (ref 101–111)
CHLORIDE: 102 mmol/L (ref 101–111)
CHLORIDE: 97 mmol/L — AB (ref 101–111)
CHLORIDE: 96 mmol/L — AB (ref 101–111)
CREATININE: 1.1 mg/dL (ref 0.61–1.24)
CREATININE: 1.2 mg/dL (ref 0.61–1.24)
CREATININE: 1.1 mg/dL (ref 0.61–1.24)
Calcium, Ion: 1.21 mmol/L (ref 1.13–1.30)
Calcium, Ion: 1.26 mmol/L (ref 1.13–1.30)
Calcium, Ion: 1.01 mmol/L — ABNORMAL LOW (ref 1.13–1.30)
Calcium, Ion: 0.97 mmol/L — ABNORMAL LOW (ref 1.13–1.30)
Calcium, Ion: 1.04 mmol/L — ABNORMAL LOW (ref 1.13–1.30)
Chloride: 101 mmol/L (ref 101–111)
Chloride: 100 mmol/L — ABNORMAL LOW (ref 101–111)
Creatinine, Ser: 1 mg/dL (ref 0.61–1.24)
Creatinine, Ser: 1.3 mg/dL — ABNORMAL HIGH (ref 0.61–1.24)
Creatinine, Ser: 1.1 mg/dL (ref 0.61–1.24)
GLUCOSE: 217 mg/dL — AB (ref 65–99)
GLUCOSE: 188 mg/dL — AB (ref 65–99)
Glucose, Bld: 138 mg/dL — ABNORMAL HIGH (ref 65–99)
Glucose, Bld: 176 mg/dL — ABNORMAL HIGH (ref 65–99)
Glucose, Bld: 104 mg/dL — ABNORMAL HIGH (ref 65–99)
Glucose, Bld: 123 mg/dL — ABNORMAL HIGH (ref 65–99)
HCT: 37 % — ABNORMAL LOW (ref 39.0–52.0)
HCT: 34 % — ABNORMAL LOW (ref 39.0–52.0)
HEMATOCRIT: 34 % — AB (ref 39.0–52.0)
HEMATOCRIT: 28 % — AB (ref 39.0–52.0)
HEMATOCRIT: 24 % — AB (ref 39.0–52.0)
HEMATOCRIT: 25 % — AB (ref 39.0–52.0)
HEMOGLOBIN: 11.6 g/dL — AB (ref 13.0–17.0)
HEMOGLOBIN: 8.5 g/dL — AB (ref 13.0–17.0)
Hemoglobin: 12.6 g/dL — ABNORMAL LOW (ref 13.0–17.0)
Hemoglobin: 11.6 g/dL — ABNORMAL LOW (ref 13.0–17.0)
Hemoglobin: 9.5 g/dL — ABNORMAL LOW (ref 13.0–17.0)
Hemoglobin: 8.2 g/dL — ABNORMAL LOW (ref 13.0–17.0)
POTASSIUM: 5.1 mmol/L (ref 3.5–5.1)
POTASSIUM: 4.8 mmol/L (ref 3.5–5.1)
POTASSIUM: 4.5 mmol/L (ref 3.5–5.1)
POTASSIUM: 4.7 mmol/L (ref 3.5–5.1)
POTASSIUM: 3.8 mmol/L (ref 3.5–5.1)
POTASSIUM: 3.7 mmol/L (ref 3.5–5.1)
SODIUM: 138 mmol/L (ref 135–145)
SODIUM: 138 mmol/L (ref 135–145)
SODIUM: 138 mmol/L (ref 135–145)
SODIUM: 137 mmol/L (ref 135–145)
Sodium: 137 mmol/L (ref 135–145)
Sodium: 137 mmol/L (ref 135–145)
TCO2: 27 mmol/L (ref 0–100)
TCO2: 26 mmol/L (ref 0–100)
TCO2: 27 mmol/L (ref 0–100)
TCO2: 28 mmol/L (ref 0–100)
TCO2: 26 mmol/L (ref 0–100)
TCO2: 28 mmol/L (ref 0–100)

## 2016-04-25 LAB — POCT I-STAT 4, (NA,K, GLUC, HGB,HCT)
Glucose, Bld: 81 mg/dL (ref 65–99)
HCT: 30 % — ABNORMAL LOW (ref 39.0–52.0)
HEMOGLOBIN: 10.2 g/dL — AB (ref 13.0–17.0)
Potassium: 3.4 mmol/L — ABNORMAL LOW (ref 3.5–5.1)
SODIUM: 140 mmol/L (ref 135–145)

## 2016-04-25 LAB — CBC
HCT: 22.1 % — ABNORMAL LOW (ref 39.0–52.0)
Hemoglobin: 7.3 g/dL — ABNORMAL LOW (ref 13.0–17.0)
MCH: 29.1 pg (ref 26.0–34.0)
MCHC: 33 g/dL (ref 30.0–36.0)
MCV: 88 fL (ref 78.0–100.0)
PLATELETS: 137 10*3/uL — AB (ref 150–400)
RBC: 2.51 MIL/uL — ABNORMAL LOW (ref 4.22–5.81)
RDW: 14.6 % (ref 11.5–15.5)
WBC: 8.5 10*3/uL (ref 4.0–10.5)

## 2016-04-25 LAB — BASIC METABOLIC PANEL
Anion gap: 6 (ref 5–15)
BUN: 31 mg/dL — AB (ref 6–20)
CO2: 27 mmol/L (ref 22–32)
Calcium: 8 mg/dL — ABNORMAL LOW (ref 8.9–10.3)
Chloride: 105 mmol/L (ref 101–111)
Creatinine, Ser: 1.69 mg/dL — ABNORMAL HIGH (ref 0.61–1.24)
GFR calc Af Amer: 46 mL/min — ABNORMAL LOW (ref >60)
GFR, EST NON AFRICAN AMERICAN: 39 mL/min — AB (ref >60)
GLUCOSE: 95 mg/dL (ref 65–99)
POTASSIUM: 4.1 mmol/L (ref 3.5–5.1)
Sodium: 138 mmol/L (ref 135–145)

## 2016-04-25 LAB — PREPARE RBC (CROSSMATCH)

## 2016-04-25 MED ORDER — SODIUM CHLORIDE 0.9 % IV SOLN: Freq: Once | INTRAVENOUS | Status: AC

## 2016-04-25 MED ORDER — INSULIN DETEMIR 100 UNIT/ML ~~LOC~~ SOLN
20.0000 [IU] | Freq: Every day | SUBCUTANEOUS | Status: DC
Start: 2016-04-25 — End: 2016-04-26
  Administered 2016-04-25: 20 [IU] via SUBCUTANEOUS
  Filled 2016-04-25 (×2): qty 0.2

## 2016-04-25 NOTE — Progress Notes (Signed)
3 Days Post-Op Procedure(s) (LRB): CORONARY ARTERY BYPASS GRAFTING (CABG) LIMA to OM1 SVG to OM2 FREE RIMA to RCA  ENDOSCOPIC HARVEST GREATER SAPHENOUS VEIN -Right Thigh (N/A) TRANSESOPHAGEAL ECHOCARDIOGRAM (TEE) (N/A) Subjective:  Complains of nausea since taking iron yesterday  Objective: Vital signs in last 24 hours: Temp:  [98.2 F (36.8 C)-99 F (37.2 C)] 99 F (37.2 C) (05/22 0728) Pulse Rate:  [76-90] 76 (05/22 0800) Cardiac Rhythm:  [-] Normal sinus rhythm (05/22 0800) Resp:  [12-24] 21 (05/22 0800) BP: (97-143)/(50-80) 143/67 mmHg (05/22 0700) SpO2:  [92 %-98 %] 95 % (05/22 0800)  Hemodynamic parameters for last 24 hours:    Intake/Output from previous day: 05/21 0701 - 05/22 0700 In: 1191 [P.O.:700; I.V.:491] Out: 1440 [Urine:1440] Intake/Output this shift: Total I/O In: 0  Out: 30 [Urine:30]  General appearance: alert and cooperative Neurologic: intact Heart: regular rate and rhythm, S1, S2 normal, no murmur, click, rub or gallop Lungs: clear to auscultation bilaterally Abdomen: soft, non-tender; bowel sounds normal; no masses,  no organomegaly Extremities: extremities normal, atraumatic, no cyanosis or edema Wound: incision ok  Lab Results:  Recent Labs  04/24/16 0407 04/25/16 0309  WBC 9.2 8.5  HGB 7.8* 7.3*  HCT 23.9* 22.1*  PLT 133* 137*   BMET:  Recent Labs  04/24/16 0407 04/25/16 0309  NA 137 138  K 4.8 4.1  CL 106 105  CO2 26 27  GLUCOSE 137* 95  BUN 25* 31*  CREATININE 2.08* 1.69*  CALCIUM 7.7* 8.0*    PT/INR:  Recent Labs  04/22/16 1530  LABPROT 17.9*  INR 1.47   ABG    Component Value Date/Time   PHART 7.343* 04/22/2016 2321   HCO3 23.0 04/22/2016 2321   TCO2 22 04/23/2016 1617   ACIDBASEDEF 2.0 04/22/2016 2321   O2SAT 97.0 04/22/2016 2321   CBG (last 3)   Recent Labs  04/24/16 1935 04/25/16 0042 04/25/16 0400  GLUCAP 194* 102* 100*    Assessment/Plan: S/P Procedure(s) (LRB): CORONARY ARTERY BYPASS  GRAFTING (CABG) LIMA to OM1 SVG to OM2 FREE RIMA to RCA  ENDOSCOPIC HARVEST GREATER SAPHENOUS VEIN -Right Thigh (N/A) TRANSESOPHAGEAL ECHOCARDIOGRAM (TEE) (N/A)  He is hemodynamically stable in sinus rhythm  He is mainly limited by nausea at this time. It is probably related to combination of diabetes, iron, possibly pain meds. Will continue reglan, stop iron, minimize narcotics. Abdomen is benign and he is passing flatus.  Expected postop blood loss anemia: Hgb is trending down a little and he can't take iron so will transfuse a unit of PRBC's.  Stage 3 CKD: creat coming back down.  Continue mobilization and IS. Don't want to transfer to the floor until he feels better.   LOS: 3 days    Alleen BorneBryan K Burrel Legrand 04/25/2016

## 2016-04-25 NOTE — Progress Notes (Signed)
[facility] BRIEF SICU PROGRESS NOTE  3 Days Post-Op  S/P Procedure(s) (LRB): CORONARY ARTERY BYPASS GRAFTING (CABG) LIMA to OM1 SVG to OM2 FREE RIMA to RCA  ENDOSCOPIC HARVEST GREATER SAPHENOUS VEIN -Right Thigh (N/A) TRANSESOPHAGEAL ECHOCARDIOGRAM (TEE) (N/A)   Stable day NSR w/ stable BP  Breathing comfortably w/ O2 sats 94% on RA UOP adequate  Plan: Continue current plan  Purcell Nailslarence H Finn Amos, MD 04/25/2016 6:27 PM

## 2016-04-25 NOTE — Care Management Important Message (Signed)
Important Message  Patient Details  Name: Derrick [name] MRN: [id number] Date of Birth: 1946-04-17   Medicare Important Message Given:  Other (see comment)    Coti Burd Abena 04/25/2016, 1:31 PM

## 2016-04-26 ENCOUNTER — Encounter (HOSPITAL_COMMUNITY): Payer: Self-pay | Admitting: Surgery

## 2016-04-26 LAB — TYPE AND SCREEN
ABO/RH(D): A POS
ANTIBODY SCREEN: NEGATIVE
UNIT DIVISION: 0

## 2016-04-26 LAB — CBC
HCT: 28.1 % — ABNORMAL LOW (ref 39.0–52.0)
Hemoglobin: 9.3 g/dL — ABNORMAL LOW (ref 13.0–17.0)
MCH: 28.8 pg (ref 26.0–34.0)
MCHC: 33.1 g/dL (ref 30.0–36.0)
MCV: 87 fL (ref 78.0–100.0)
PLATELETS: 196 10*3/uL (ref 150–400)
RBC: 3.23 MIL/uL — AB (ref 4.22–5.81)
RDW: 14.7 % (ref 11.5–15.5)
WBC: 10 10*3/uL (ref 4.0–10.5)

## 2016-04-26 LAB — GLUCOSE, CAPILLARY
GLUCOSE-CAPILLARY: 96 mg/dL (ref 65–99)
GLUCOSE-CAPILLARY: 184 mg/dL — AB (ref 65–99)
Glucose-Capillary: 70 mg/dL (ref 65–99)
Glucose-Capillary: 150 mg/dL — ABNORMAL HIGH (ref 65–99)
Glucose-Capillary: 194 mg/dL — ABNORMAL HIGH (ref 65–99)

## 2016-04-26 LAB — BASIC METABOLIC PANEL
ANION GAP: 8 (ref 5–15)
BUN: 28 mg/dL — AB (ref 6–20)
CO2: 27 mmol/L (ref 22–32)
CREATININE: 1.4 mg/dL — AB (ref 0.61–1.24)
Calcium: 8.3 mg/dL — ABNORMAL LOW (ref 8.9–10.3)
Chloride: 102 mmol/L (ref 101–111)
GFR calc non Af Amer: 49 mL/min — ABNORMAL LOW (ref >60)
GFR, EST AFRICAN AMERICAN: 57 mL/min — AB (ref >60)
GLUCOSE: 126 mg/dL — AB (ref 65–99)
POTASSIUM: 4.4 mmol/L (ref 3.5–5.1)
SODIUM: 137 mmol/L (ref 135–145)

## 2016-04-26 MED ORDER — SODIUM CHLORIDE 0.9% FLUSH
3.0000 mL | Freq: Two times a day (BID) | INTRAVENOUS | Status: DC
  Administered 2016-04-26 – 2016-04-27 (×2): 3 mL via INTRAVENOUS

## 2016-04-26 MED ORDER — ONDANSETRON HCL 4 MG/2ML IJ SOLN: 4.0000 mg | Freq: Four times a day (QID) | INTRAMUSCULAR | Status: DC | PRN

## 2016-04-26 MED ORDER — INSULIN DETEMIR 100 UNIT/ML ~~LOC~~ SOLN
15.0000 [IU] | Freq: Every day | SUBCUTANEOUS | Status: DC
  Administered 2016-04-26 – 2016-04-27 (×2): 15 [IU] via SUBCUTANEOUS
  Filled 2016-04-26 (×2): qty 0.15

## 2016-04-26 MED ORDER — ONDANSETRON HCL 4 MG PO TABS: 4.0000 mg | ORAL_TABLET | Freq: Four times a day (QID) | ORAL | Status: DC | PRN

## 2016-04-26 MED ORDER — METOPROLOL TARTRATE 12.5 MG HALF TABLET
12.5000 mg | ORAL_TABLET | Freq: Two times a day (BID) | ORAL | Status: DC
  Administered 2016-04-26 – 2016-04-27 (×2): 12.5 mg via ORAL
  Filled 2016-04-26 (×2): qty 1

## 2016-04-26 MED ORDER — TRAMADOL HCL 50 MG PO TABS: 50.0000 mg | ORAL_TABLET | ORAL | Status: DC | PRN

## 2016-04-26 MED ORDER — ASPIRIN EC 325 MG PO TBEC
325.0000 mg | DELAYED_RELEASE_TABLET | Freq: Every day | ORAL | Status: DC
  Administered 2016-04-27: 325 mg via ORAL
  Filled 2016-04-26: qty 1

## 2016-04-26 MED ORDER — MOVING RIGHT ALONG BOOK
Freq: Once | Status: DC
  Filled 2016-04-26: qty 1

## 2016-04-26 MED ORDER — OXYCODONE HCL 5 MG PO TABS: 5.0000 mg | ORAL_TABLET | ORAL | Status: DC | PRN

## 2016-04-26 MED ORDER — INSULIN ASPART 100 UNIT/ML ~~LOC~~ SOLN
0.0000 [IU] | Freq: Three times a day (TID) | SUBCUTANEOUS | Status: DC
  Administered 2016-04-26: 4 [IU] via SUBCUTANEOUS
  Administered 2016-04-27: 8 [IU] via SUBCUTANEOUS

## 2016-04-26 MED ORDER — MOVING RIGHT ALONG BOOK
Freq: Once | Status: AC
  Administered 2016-04-26: 20:00:00
  Filled 2016-04-26: qty 1

## 2016-04-26 MED ORDER — SODIUM CHLORIDE 0.9% FLUSH: 3.0000 mL | INTRAVENOUS | Status: DC | PRN

## 2016-04-26 MED ORDER — SODIUM CHLORIDE 0.9 % IV SOLN: 250.0000 mL | INTRAVENOUS | Status: DC | PRN

## 2016-04-26 NOTE — Progress Notes (Signed)
Patient sitting up in bed, no needs at this time. Call light within reach.  

## 2016-04-26 NOTE — Progress Notes (Signed)
4 Days Post-Op Procedure(s) (LRB): CORONARY ARTERY BYPASS GRAFTING (CABG) LIMA to OM1 SVG to OM2 FREE RIMA to RCA  ENDOSCOPIC HARVEST GREATER SAPHENOUS VEIN -Right Thigh (N/A) TRANSESOPHAGEAL ECHOCARDIOGRAM (TEE) (N/A) Subjective:  No complaints, feels well. Bowels moved. Nausea resolved  Objective: Vital signs in last 24 hours: Temp:  [97.9 F (36.6 C)-99.6 F (37.6 C)] 98.7 F (37.1 C) (05/23 0800) Pulse Rate:  [76-85] 83 (05/23 0800) Cardiac Rhythm:  [-] Normal sinus rhythm (05/23 0800) Resp:  [17-31] 29 (05/23 0800) BP: (112-151)/(56-110) 137/73 mmHg (05/23 0800) SpO2:  [92 %-99 %] 96 % (05/23 0800) Weight:  [62.5 kg (137 lb 12.6 oz)-63.3 kg (139 lb 8.8 oz)] 63.3 kg (139 lb 8.8 oz) (05/23 0600)  Hemodynamic parameters for last 24 hours:    Intake/Output from previous day: 05/22 0701 - 05/23 0700 In: 965 [P.O.:600; Blood:365] Out: 1700 [Urine:1700] Intake/Output this shift: Total I/O In: 200 [P.O.:200] Out: -   General appearance: alert and cooperative Neurologic: intact Heart: regular rate and rhythm, S1, S2 normal, no murmur, click, rub or gallop Lungs: clear to auscultation bilaterally Abdomen: soft, non-tender; bowel sounds normal; no masses,  no organomegaly Extremities: extremities normal, atraumatic, no cyanosis or edema Wound: incisions ok  Lab Results:  Recent Labs  04/25/16 0309 04/26/16 0654  WBC 8.5 10.0  HGB 7.3* 9.3*  HCT 22.1* 28.1*  PLT 137* 196   BMET:  Recent Labs  04/25/16 0309 04/26/16 0654  NA 138 137  K 4.1 4.4  CL 105 102  CO2 27 27  GLUCOSE 95 126*  BUN 31* 28*  CREATININE 1.69* 1.40*  CALCIUM 8.0* 8.3*    PT/INR: No results for input(s): LABPROT, INR in the last 72 hours. ABG    Component Value Date/Time   PHART 7.343* 04/22/2016 2321   HCO3 23.0 04/22/2016 2321   TCO2 22 04/23/2016 1617   ACIDBASEDEF 2.0 04/22/2016 2321   O2SAT 97.0 04/22/2016 2321   CBG (last 3)   Recent Labs  04/25/16 2357 04/26/16 0356  04/26/16 0842  GLUCAP 85 70 96    Assessment/Plan: S/P Procedure(s) (LRB): CORONARY ARTERY BYPASS GRAFTING (CABG) LIMA to OM1 SVG to OM2 FREE RIMA to RCA  ENDOSCOPIC HARVEST GREATER SAPHENOUS VEIN -Right Thigh (N/A) TRANSESOPHAGEAL ECHOCARDIOGRAM (TEE) (N/A)  He is doing well now POD 4. Nausea resolved. Hgb much improved after transfusion. Glucose under good control. Will decrease Levemir to 15 today. He was on insulin pump preop. Remove pacing wires Will resume beta blocker since hemodynamics stable now. He has been tried on three statin drugs in the past and all gave him severe muscle cramps so will avoid those. Renal function returning to baseline. Can resume low dose lisinopril at discharge. Transfer to 2W and plan home tomorrow if no changes.   LOS: 4 days    Alleen BorneBryan K Bartle 04/26/2016

## 2016-04-26 NOTE — Progress Notes (Signed)
Report called Dierdre HighmanJenny RN on 2 west. Pt aware of transport. Called and let wife know pt would be transferring to 2W22. Emelda Brothershristy Kanitz RN

## 2016-04-26 NOTE — Progress Notes (Signed)
Patient arrived from 2South to 2West.  Telemetry applied and CCMD notified.  Patient oriented to room including call light and telephone.  Patient denies pain.  No signs/symptoms of distress.  Will continue to monitor.

## 2016-04-27 LAB — GLUCOSE, CAPILLARY
GLUCOSE-CAPILLARY: 131 mg/dL — AB (ref 65–99)
GLUCOSE-CAPILLARY: 142 mg/dL — AB (ref 65–99)
Glucose-Capillary: 201 mg/dL — ABNORMAL HIGH (ref 65–99)

## 2016-04-27 MED ORDER — ASPIRIN 325 MG PO TBEC: 325.0000 mg | DELAYED_RELEASE_TABLET | Freq: Every day | ORAL | Status: DC

## 2016-04-27 MED ORDER — METOPROLOL TARTRATE 25 MG PO TABS: 12.5000 mg | ORAL_TABLET | Freq: Two times a day (BID) | ORAL | Status: AC

## 2016-04-27 MED ORDER — HYDROCODONE-ACETAMINOPHEN 5-325 MG PO TABS: 1.0000 | ORAL_TABLET | Freq: Four times a day (QID) | ORAL | Status: DC | PRN

## 2016-04-27 MED FILL — Electrolyte-R (PH 7.4) Solution: INTRAVENOUS | Qty: 6000 | Status: AC

## 2016-04-27 MED FILL — Lidocaine HCl IV Inj 20 MG/ML: INTRAVENOUS | Qty: 5 | Status: AC

## 2016-04-27 MED FILL — Mannitol IV Soln 20%: INTRAVENOUS | Qty: 500 | Status: AC

## 2016-04-27 MED FILL — Sodium Chloride IV Soln 0.9%: INTRAVENOUS | Qty: 2000 | Status: AC

## 2016-04-27 MED FILL — Heparin Sodium (Porcine) Inj 1000 Unit/ML: INTRAMUSCULAR | Qty: 20 | Status: AC

## 2016-04-27 MED FILL — Sodium Bicarbonate IV Soln 8.4%: INTRAVENOUS | Qty: 50 | Status: AC

## 2016-04-27 MED FILL — Albumin, Human Inj 5%: INTRAVENOUS | Qty: 250 | Status: AC

## 2016-04-27 NOTE — Progress Notes (Addendum)
CARDIAC REHAB PHASE I   PRE:  Rate/Rhythm: 90 SR    BP: sitting 131/74    SaO2: 94 RA  MODE:  Ambulation: 550 ft   POST:  Rate/Rhythm: 108 ST    BP: sitting 150/59     SaO2: 99 RA   Tolerated very well, did not use RW or need assist. Did need instructions to get out of bed without pulling. Set up d/c video for pt. Will await wife for d/c ed. [phone]  Harriet MassonRandi Kristan Kelee Cunningham CES, ACSM 04/27/2016 10:08 AM   Ed completed with pt and wife with good reception. [facility] and will send referral to [place].  [phone] Ethelda ChickKristan Rondrick Barreira CES, ACSM 11:33 AM 04/27/2016

## 2016-04-27 NOTE — Care Management Note (Signed)
Case Management Note Donn PieriniKristi Naziyah Tieszen RN, BSN Unit 2W-Case Manager [phone]  Patient Details  Name: Derrick RedderJerry E [name] MRN: [id number] Date of Birth: 1946-10-15  Subjective/Objective:  Admitted with abnormal stress, MVD found- s/p CABG on this admission                  Action/Plan: PTA pt; lived at home - plan to return home- no CM needs noted  Expected Discharge Date:     04/27/16             Expected Discharge Plan:  Home/Self Care  In-House Referral:     Discharge planning Services  CM Consult  Post Acute Care Choice:    Choice offered to:     DME Arranged:    DME Agency:     HH Arranged:    HH Agency:     Status of Service:  Completed, signed off  Medicare Important Message Given:  Yes Date Medicare IM Given:    Medicare IM give by:    Date Additional Medicare IM Given:    Additional Medicare Important Message give by:     If discussed at Long Length of Stay Meetings, dates discussed:    Additional Comments:  Darrold SpanWebster, Marlow Berenguer Hall, RN 04/27/2016, 11:48 AM

## 2016-04-27 NOTE — Care Management Important Message (Signed)
Important Message  Patient Details  Name: Donley RedderJerry E Schnitker MRN: [id number] Date of Birth: 19-Jul-1946   Medicare Important Message Given:  Yes    Bernadette HoitShoffner, Byren Pankow Coleman 04/27/2016, 11:16 AM

## 2016-04-27 NOTE — Progress Notes (Addendum)
      [address]       [place] [id number]             [phone]      5 Days Post-Op Procedure(s) (LRB): CORONARY ARTERY BYPASS GRAFTING (CABG) LIMA to OM1 SVG to OM2 FREE RIMA to RCA  ENDOSCOPIC HARVEST GREATER SAPHENOUS VEIN -Right Thigh (N/A) TRANSESOPHAGEAL ECHOCARDIOGRAM (TEE) (N/A)   Subjective:  [name] has no complaints.  He states he feels really good and wants to go home.  He is ambulating without difficulty.  + BM  Objective: Vital signs in last 24 hours: Temp:  [98.1 F (36.7 C)-99.8 F (37.7 C)] 98.2 F (36.8 C) (05/24 0610) Pulse Rate:  [80-94] 88 (05/24 0610) Cardiac Rhythm:  [-] Normal sinus rhythm (05/23 1926) Resp:  [13-28] 16 (05/24 0610) BP: (121-149)/(62-85) 126/62 mmHg (05/24 0610) SpO2:  [93 %-100 %] 94 % (05/24 0610) Weight:  [136 lb 8 oz (61.916 kg)] 136 lb 8 oz (61.916 kg) (05/24 0548)  Intake/Output from previous day: 05/23 0701 - 05/24 0700 In: 300 [P.O.:300] Out: 900 [Urine:900]  General appearance: alert, cooperative and no distress Heart: regular rate and rhythm Lungs: clear to auscultation bilaterally Abdomen: soft, non-tender; bowel sounds normal; no masses,  no organomegaly Extremities: edema trace Wound: clean and dry  Lab Results:  Recent Labs  04/25/16 0309 04/26/16 0654  WBC 8.5 10.0  HGB 7.3* 9.3*  HCT 22.1* 28.1*  PLT 137* 196   BMET:  Recent Labs  04/25/16 0309 04/26/16 0654  NA 138 137  K 4.1 4.4  CL 105 102  CO2 27 27  GLUCOSE 95 126*  BUN 31* 28*  CREATININE 1.69* 1.40*  CALCIUM 8.0* 8.3*    PT/INR: No results for input(s): LABPROT, INR in the last 72 hours. ABG    Component Value Date/Time   PHART 7.343* 04/22/2016 2321   HCO3 23.0 04/22/2016 2321   TCO2 22 04/23/2016 1617   ACIDBASEDEF 2.0 04/22/2016 2321   O2SAT 97.0 04/22/2016 2321   CBG (last 3)   Recent Labs  04/26/16 2031 04/27/16 0128 04/27/16 0611  GLUCAP 184* 131* 142*    Assessment/Plan: S/P  Procedure(s) (LRB): CORONARY ARTERY BYPASS GRAFTING (CABG) LIMA to OM1 SVG to OM2 FREE RIMA to RCA  ENDOSCOPIC HARVEST GREATER SAPHENOUS VEIN -Right Thigh (N/A) TRANSESOPHAGEAL ECHOCARDIOGRAM (TEE) (N/A)  1. CV- NSR, rate controlled, mild HTN- will continue Lopressor, no ACE/ARB at this time due to elevated creatinine 2. Pulm-no acute issues, continue IS 3. Renal- creatinine elevated at 1.40, weight is below baseline 4. DM-cbgs mostly controlled, continue current care 5. Dispo- patient stable, will d/c home today  LOS: 5 days    Derrick PitcherBARRETT, Derrick 04/27/2016   Chart reviewed, patient examined, agree with above. He looks great. Plan home today.

## 2016-04-27 NOTE — Progress Notes (Signed)
Derrick RedderJerry E [name] to be D/C'd Home per MD order. Discussed with the patient and all questions fully answered.    VVS, Skin clean, dry and intact without evidence of skin break down, no evidence of skin tears noted.  IV catheter discontinued intact. Site without signs and symptoms of complications. Dressing and pressure applied.  An After Visit Summary was printed and given to the patient.  Patient escorted via WC, and D/C home via private auto.  [name], [name]  04/27/2016 12:48 PM

## 2016-04-27 NOTE — Discharge Summary (Signed)
Physician Discharge Summary  Patient ID: MEKHI SONN MRN: [id number] DOB/AGE: 71-Jan-1947 71 y.o.  Admit date: 04/22/2016 Discharge date: 04/27/2016  Admission Diagnoses:  Patient Active Problem List   Diagnosis Date Noted  . Abnormal stress test   . Claudication ([place])   . Preventative health care 07/02/2014  . Hypertension associated with diabetes ([place]) 12/03/2013  . Normocytic anemia 12/03/2013  . Health maintenance examination 08/16/2013  . Systolic murmur 25/95/6387  . Type II or unspecified type diabetes mellitus without mention of complication, uncontrolled 11/15/2012  . Prostate cancer screening 03/14/2012  . Actinic keratosis 03/09/2011  . CAD 01/25/2011  . PAIN IN SOFT TISSUES OF LIMB 11/08/2010  . HYPERLIPIDEMIA 04/30/2009  . Acute myocardial infarction, subendocardial infarction ([place]) 04/30/2009  . COPD 04/29/2009   Discharge Diagnoses:   Patient Active Problem List   Diagnosis Date Noted  . S/P CABG x 3 04/22/2016  . Abnormal stress test   . Claudication ([place])   . Preventative health care 07/02/2014  . Hypertension associated with diabetes ([place]) 12/03/2013  . Normocytic anemia 12/03/2013  . Health maintenance examination 08/16/2013  . Systolic murmur 56/43/3295  . Type II or unspecified type diabetes mellitus without mention of complication, uncontrolled 11/15/2012  . Prostate cancer screening 03/14/2012  . Actinic keratosis 03/09/2011  . CAD 01/25/2011  . PAIN IN SOFT TISSUES OF LIMB 11/08/2010  . HYPERLIPIDEMIA 04/30/2009  . Acute myocardial infarction, subendocardial infarction ([place]) 04/30/2009  . COPD 04/29/2009   Discharged Condition: good  History of Present Illness:  Mr. Sylve is a 70 yo white male with known history of DM, Hyperlipidemia with statin intolerance, previous nicotine abuse, and positive family history of CAD.  In 2010 he suffered and acute MI which was treated with PCI with stent placement to his proximal LAD.  He was doing well  until recently when he developed occasional exertional chest pain.  He was evaluated by Dr. Gwenlyn Found who recommended the patient undergo a stress test.  This was positive with inferior and lateral ischemia.  Subsequent catheterization on 03/28/2016 showed severe multivessel CAD with patent stent in the LAD.  It was felt coronary bypass grafting would be indicated and the patient was referred to [facility] for further evaluation.  He was evaluated by Dr. Cyndia Bent who was in agreement the patient would benefit from coronary bypass procedure.  The risks and benefits of the procedure were explained to the patient and he was agreeable to proceed.  Hospital Course:   Mr. Weatherholtz presented to [facility] on 04/22/2016.  He was taken to the operating room and underwent CABG x 3 utilizing LIMA to LAD, SVG to OM2, and Free RIMA to the RCA.  He underwent endoscopic vein harvest from his right leg.  He tolerated the procedure without difficulty and was taken to the SICU in stable condition.  He was extubated the evening of surgery.  During his stay in the SICU the patient was weaned off Neo synephrine as tolerated.  His chest tubes and arterial lines were removed without difficulty.  He was hyperglycemic and his insulin regimen was adjusted as needed.  He has a known history of CKD.  He creatinine level peaked around 2, therefore he was not diuresed or treated with an ACE inhibitor for hypertension.  He was maintaining NSR and his temporary pacing wires were removed without difficulty.  He was felt medically stable for transfer to the telemetry unit on POD #4.  He continues to progress.  He is ambulating without  difficulty.  He continues to maintain NSR with good rate and blood pressure.  His creatinine has stabilized and is down to 1.40.  He is felt to be medically stable for discharge home today.    Significant Diagnostic Studies: angiography:   1. Prox RCA to Mid RCA lesion, 70% stenosed. 2. Ost LAD to Prox LAD lesion,  10% stenosed. The lesion was previously treated with a stent (unknown type). 3. Prox Cx to Mid Cx lesion, 70% stenosed. 4. Ost Cx lesion, 95% stenosed.  Treatments: surgery:   5. Median Sternotomy 6. Extracorporeal circulation 3. Coronary artery bypass grafting x 3   Left internal mammary graft to the OM1  SVG to OM2  Free Right internal mammary graft to the RCA  4. Endoscopic vein harvest from the right leg  Disposition:Homr  Discharge medications:  The patient has been discharged on:   1.Beta Blocker:  Yes [ x  ]                              No   [   ]                              If No, reason:  2.Ace Inhibitor/ARB: Yes [   ]                                     No  [  x  ]                                     If No, reason: Elevated creatinine  3.Statin:   Yes [   ]                  No  [ x  ]                  If No, reason: Intolerance  4.[place]:  Yes  [ x  ]                  No   [   ]                  If No, reason:     Medication List    STOP taking these medications        lisinopril 2.5 MG tablet  Commonly known as:  PRINIVIL,ZESTRIL      TAKE these medications        aspirin 325 MG EC tablet  Take 1 tablet (325 mg total) by mouth daily.     glucose blood test strip  Commonly known as:  ONE TOUCH ULTRA TEST  USE ONE STRIP TO CHECK GLUCOSE 4 TIMES DAILY AS NEEDED     HYDROcodone-acetaminophen 5-325 MG tablet  Commonly known as:  NORCO/VICODIN  Take 1 tablet by mouth every 6 (six) hours as needed for moderate pain.     insulin pump Soln  Inject 1 each into the skin continuous. 2.7 small, 5.5 medium, 8.5 large meal settings     metoprolol tartrate 25 MG tablet  Commonly known as:  LOPRESSOR  Take 0.5 tablets (12.5 mg total) by mouth 2 (two) times daily.     nitroGLYCERIN 0.4  MG SL tablet  Commonly known as:  NITROSTAT  Place 1 tablet (0.4 mg total) under the tongue every 5 (five) minutes as needed.     omeprazole 20 MG capsule   Commonly known as:  PRILOSEC  TAKE ONE CAPSULE BY MOUTH ONCE DAILY IN THE MORNING     ONE TOUCH ULTRA SYSTEM KIT w/Device Kit  Patient must check sugar TID     ONETOUCH DELICA LANCETS 93X Misc  USE ONE  4 TIMES DAILY AS NEEDED     research study medication  Take 1 each by mouth daily. Study drug for cholesterol from Dr. Deland Pretty       Follow-up Information    Follow up with Gaye Pollack, MD In 4 weeks.   Specialty:  Cardiothoracic Surgery   Why:  Office will contact you with appointment date and time   Contact information:   [place] [address] [phone]       Follow up with [facility] In 4 weeks.   Why:  Please get CXR 30 min prior to your appointment with Dr. Cyndia Bent, located on first floor of our office building   Contact information:   [facility]       Follow up with Quay Burow, MD.   Specialties:  Cardiology, Radiology   Why:  Office will contact you with appointment, if you do not hear from them by Friday, please contact office and set up hospital follow up   Contact information:   [address] [place] [address] [phone]       Signed: Ellwood Handler 04/27/2016, 8:35 AM

## 2016-04-28 DIAGNOSIS — E119 Type 2 diabetes mellitus without complications: Secondary | ICD-10-CM | POA: Diagnosis not present

## 2016-05-04 ENCOUNTER — Encounter (INDEPENDENT_AMBULATORY_CARE_PROVIDER_SITE_OTHER): Payer: Self-pay

## 2016-05-04 ENCOUNTER — Other Ambulatory Visit: Payer: Self-pay | Admitting: *Deleted

## 2016-05-04 DIAGNOSIS — Z951 Presence of aortocoronary bypass graft: Secondary | ICD-10-CM

## 2016-05-04 DIAGNOSIS — G8918 Other acute postprocedural pain: Secondary | ICD-10-CM

## 2016-05-04 MED ORDER — HYDROCODONE-ACETAMINOPHEN 5-325 MG PO TABS: 1.0000 | ORAL_TABLET | Freq: Four times a day (QID) | ORAL | Status: AC | PRN

## 2016-05-04 MED ORDER — HYDROCODONE-ACETAMINOPHEN 5-325 MG PO TABS: 1.0000 | ORAL_TABLET | Freq: Four times a day (QID) | ORAL | Status: DC | PRN

## 2016-05-16 ENCOUNTER — Encounter: Payer: Self-pay | Admitting: Cardiology

## 2016-05-16 ENCOUNTER — Ambulatory Visit (INDEPENDENT_AMBULATORY_CARE_PROVIDER_SITE_OTHER): Payer: Medicare Other | Admitting: Cardiology

## 2016-05-16 VITALS — BP 108/70 | HR 82 | Ht 66.0 in | Wt 137.1 lb

## 2016-05-16 DIAGNOSIS — I10 Essential (primary) hypertension: Secondary | ICD-10-CM

## 2016-05-16 DIAGNOSIS — N189 Chronic kidney disease, unspecified: Secondary | ICD-10-CM | POA: Diagnosis not present

## 2016-05-16 DIAGNOSIS — I739 Peripheral vascular disease, unspecified: Secondary | ICD-10-CM | POA: Insufficient documentation

## 2016-05-16 DIAGNOSIS — N183 Chronic kidney disease, stage 3 unspecified: Secondary | ICD-10-CM | POA: Insufficient documentation

## 2016-05-16 DIAGNOSIS — Z889 Allergy status to unspecified drugs, medicaments and biological substances status: Secondary | ICD-10-CM

## 2016-05-16 DIAGNOSIS — E118 Type 2 diabetes mellitus with unspecified complications: Secondary | ICD-10-CM | POA: Diagnosis not present

## 2016-05-16 DIAGNOSIS — Z951 Presence of aortocoronary bypass graft: Secondary | ICD-10-CM | POA: Diagnosis not present

## 2016-05-16 DIAGNOSIS — Z794 Long term (current) use of insulin: Secondary | ICD-10-CM

## 2016-05-16 DIAGNOSIS — Z789 Other specified health status: Secondary | ICD-10-CM

## 2016-05-16 DIAGNOSIS — E1159 Type 2 diabetes mellitus with other circulatory complications: Secondary | ICD-10-CM

## 2016-05-16 NOTE — Patient Instructions (Addendum)
Your physician recommends that you continue on your current medications as directed. Please refer to the Current Medication list given to you today.  Please keep your previously scheduled appointment with Dr Allyson SabalBerry on 06/21/16.  If you need a refill on your cardiac medications before your next appointment, please call your pharmacy.

## 2016-05-16 NOTE — Assessment & Plan Note (Signed)
Myalgias. He is on statin study drug

## 2016-05-16 NOTE — Progress Notes (Signed)
05/16/2016 Derrick [name]   Feb 10, 1946  [id number]  Primary Physician Horatio Pel, MD Primary Cardiologist: Dr Gwenlyn Found  HPI:  70 y/o male with a history of remote PCI 2010. He saw Dr Gwenlyn Found this spring with chest pain. The pt had a Myoview which was abnormal, followed by cath showing 3V CAD, and ultimately CABG x 3 04/22/16. EF was 60-65% on pre op echo. Other problems include PVD with an occluded RCIA, high grade LCIA with Rt hip claudication as well as 60-79% asymptomatic RICA stenosis, type 2 IDDM, and stage 3 CRI. He tolerated his CABG well and is in the office today for follow up. He denies any unusual dyspnea. His appetite is improving.    Current Outpatient Prescriptions  Medication Sig Dispense Refill  . aspirin EC 325 MG EC tablet Take 1 tablet (325 mg total) by mouth daily. 30 tablet 0  . Blood Glucose Monitoring Suppl (ONE TOUCH ULTRA SYSTEM KIT) W/DEVICE KIT Patient must check sugar TID 1 each 0  . glucose blood (ONE TOUCH ULTRA TEST) test strip USE ONE STRIP TO CHECK GLUCOSE 4 TIMES DAILY AS NEEDED 100 each 12  . HYDROcodone-acetaminophen (NORCO/VICODIN) 5-325 MG tablet Take 1 tablet by mouth every 6 (six) hours as needed for moderate pain. 40 tablet 0  . Insulin Human (INSULIN PUMP) SOLN Inject 1 each into the skin continuous. 2.7 small, 5.5 medium, 8.5 large meal settings    . metoprolol tartrate (LOPRESSOR) 25 MG tablet Take 0.5 tablets (12.5 mg total) by mouth 2 (two) times daily. 90 tablet 1  . nitroGLYCERIN (NITROSTAT) 0.4 MG SL tablet Place 1 tablet (0.4 mg total) under the tongue every 5 (five) minutes as needed. (Patient taking differently: Place 0.4 mg under the tongue every 5 (five) minutes as needed for chest pain. ) 20 tablet 1  . omeprazole (PRILOSEC) 20 MG capsule TAKE ONE CAPSULE BY MOUTH ONCE DAILY IN THE MORNING (Patient taking differently: TAKE ONE CAPSULE (20 mg) BY MOUTH ONCE DAILY IN THE MORNING) 90 capsule 3  . ONETOUCH DELICA LANCETS 36I MISC USE  ONE  4 TIMES DAILY AS NEEDED 100 each 0  . research study medication Take 1 each by mouth daily. Study drug for cholesterol from Dr. Deland Pretty     No current facility-administered medications for this visit.    Allergies  Allergen Reactions  . Atorvastatin     Muscle cramps   . Lovastatin     Muscle cramps  . Simvastatin Other (See Comments)    Muscle pain  . Pravastatin Other (See Comments)    Muscle cramps    Social History   Social History  . Marital Status: Married    Spouse Name: N/A  . Number of Children: N/A  . Years of Education: N/A   Occupational History  . Not on file.   Social History Main Topics  . Smoking status: Former Smoker -- 1.00 packs/day for 50 years    Types: Cigarettes    Quit date: 01/06/2009  . Smokeless tobacco: Never Used     Comment: Counseled to remain smoke free  . Alcohol Use: No  . Drug Use: No  . Sexual Activity: No   Other Topics Concern  . Not on file   Social History Narrative   Married, works part time at [employer] in [place], [place].   Former smoker: quit 01/2011.  No alcohol or drugs.   No formal exercise.     Review of Systems: General: negative  for chills, fever, night sweats or weight changes.  Cardiovascular: negative for chest pain, dyspnea on exertion, edema, orthopnea, palpitations, paroxysmal nocturnal dyspnea or shortness of breath Dermatological: negative for rash Respiratory: negative for cough or wheezing Urologic: negative for hematuria Abdominal: negative for nausea, vomiting, diarrhea, bright red blood per rectum, melena, or hematemesis Neurologic: negative for visual changes, syncope, or dizziness All other systems reviewed and are otherwise negative except as noted above.    Blood pressure 108/70, pulse 82, height '5\' 6"'$  (1.676 m), weight 137 lb 2 oz (62.199 kg).  General appearance: alert, cooperative and no distress Neck: no JVD and Lt CA bruit Lungs: clear to auscultation  bilaterally Heart: regular rate and rhythm Extremities: no edema, warm to touch Pulses: diminnished LE pulses, sparse hair growth on Rt Skin: pale, warm, dry Neurologic: Grossly normal  EKG NSR, TWI V4-V6  ASSESSMENT AND PLAN:   S/P CABG x 3 C/P-Abnormal Myoview 02/23/16-cath 03/28/16- CABG x 3 04/22/16 LIMA-OM1, SVG-OM2, free RIMA-RCA Doing well post OP  PVD (peripheral vascular disease) ([place]) Pt has know occluded RCIA, high grade LICA, with Rt hip claudication. He also has 60-79% RICA by pre op dopplers.  Chronic renal insufficiency, stage III (moderate) SCr 2.0, GFR 40's  Diabetes mellitus with complication, with long-term current use of insulin (HCC) Pt has an insulin pump, CRI, PVD, and CAD  Statin intolerance Myalgias. He is on statin study drug  Hypertension associated with diabetes Controlled    PLAN  He has a f/u scheduled with Dr Cyndia Bent 6/21 and Dr Gwenlyn Found 7/18. No change in his medications. He is not on an ACR/ARB secondary to CRI. He has a history of statin intol and is on a statin study drug.   Kerin Ransom PA-C 05/16/2016 10:13 AM

## 2016-05-16 NOTE — Assessment & Plan Note (Signed)
C/P-Abnormal Myoview 02/23/16-cath 03/28/16- CABG x 3 04/22/16 LIMA-OM1, SVG-OM2, free RIMA-RCA Doing well post OP

## 2016-05-16 NOTE — Assessment & Plan Note (Signed)
Controlled.  

## 2016-05-16 NOTE — Assessment & Plan Note (Signed)
Pt has an insulin pump, CRI, PVD, and CAD

## 2016-05-16 NOTE — Assessment & Plan Note (Signed)
SCr 2.0, GFR 40's

## 2016-05-16 NOTE — Assessment & Plan Note (Signed)
Pt has know occluded RCIA, high grade LICA, with Rt hip claudication. He also has 60-79% RICA by pre op dopplers.

## 2016-05-19 ENCOUNTER — Other Ambulatory Visit: Payer: Self-pay | Admitting: Surgery

## 2016-05-19 DIAGNOSIS — Z951 Presence of aortocoronary bypass graft: Secondary | ICD-10-CM

## 2016-05-25 ENCOUNTER — Ambulatory Visit
Admission: RE | Admit: 2016-05-25 | Discharge: 2016-05-25 | Disposition: A | Discharge status: Home / Self Care | Payer: Medicare Other | Source: Ambulatory Visit | Attending: Surgery | Admitting: Surgery

## 2016-05-25 ENCOUNTER — Ambulatory Visit (INDEPENDENT_AMBULATORY_CARE_PROVIDER_SITE_OTHER): Payer: Self-pay | Admitting: Surgery

## 2016-05-25 ENCOUNTER — Encounter: Payer: Self-pay | Admitting: Surgery

## 2016-05-25 VITALS — BP 107/67 | HR 86 | Resp 16 | Ht 66.0 in | Wt 137.0 lb

## 2016-05-25 DIAGNOSIS — J90 Pleural effusion, not elsewhere classified: Secondary | ICD-10-CM | POA: Diagnosis not present

## 2016-05-25 DIAGNOSIS — Z951 Presence of aortocoronary bypass graft: Secondary | ICD-10-CM

## 2016-05-25 DIAGNOSIS — I2510 Atherosclerotic heart disease of native coronary artery without angina pectoris: Secondary | ICD-10-CM

## 2016-05-26 ENCOUNTER — Encounter: Payer: Self-pay | Admitting: Surgery

## 2016-05-26 NOTE — Progress Notes (Signed)
      HPI: Patient returns for routine postoperative follow-up having undergone CABG x 3  on 04/22/2016. The patient's early postoperative recovery while in the hospital was notable for an uncomplicated postop course. Since hospital discharge the patient reports that he has been feeling well. He is walking as much as possible without chest pain or shortness of breath. He is limited due to right hip claudication from RCIA occlusion.   Current Outpatient Prescriptions  Medication Sig Dispense Refill  . aspirin EC 325 MG EC tablet Take 1 tablet (325 mg total) by mouth daily. 30 tablet 0  . Blood Glucose Monitoring Suppl (ONE TOUCH ULTRA SYSTEM KIT) W/DEVICE KIT Patient must check sugar TID 1 each 0  . glucose blood (ONE TOUCH ULTRA TEST) test strip USE ONE STRIP TO CHECK GLUCOSE 4 TIMES DAILY AS NEEDED 100 each 12  . HYDROcodone-acetaminophen (NORCO/VICODIN) 5-325 MG tablet Take 1 tablet by mouth every 6 (six) hours as needed for moderate pain. 40 tablet 0  . Insulin Human (INSULIN PUMP) SOLN Inject 1 each into the skin continuous. 2.7 small, 5.5 medium, 8.5 large meal settings    . metoprolol tartrate (LOPRESSOR) 25 MG tablet Take 0.5 tablets (12.5 mg total) by mouth 2 (two) times daily. 90 tablet 1  . nitroGLYCERIN (NITROSTAT) 0.4 MG SL tablet Place 1 tablet (0.4 mg total) under the tongue every 5 (five) minutes as needed. (Patient taking differently: Place 0.4 mg under the tongue every 5 (five) minutes as needed for chest pain. ) 20 tablet 1  . omeprazole (PRILOSEC) 20 MG capsule TAKE ONE CAPSULE BY MOUTH ONCE DAILY IN THE MORNING (Patient taking differently: TAKE ONE CAPSULE (20 mg) BY MOUTH ONCE DAILY IN THE MORNING) 90 capsule 3  . ONETOUCH DELICA LANCETS 33G MISC USE ONE  4 TIMES DAILY AS NEEDED 100 each 0  . research study medication Take 1 each by mouth daily. Study drug for cholesterol from Dr. Walter Pharr     No current facility-administered medications for this visit.    Physical  Exam: BP 107/67 mmHg  Pulse 86  Resp 16  Ht 5' 6" (1.676 m)  Wt 137 lb (62.143 kg)  BMI 22.12 kg/m2  SpO2 97% He looks well. Lung exam is clear. Cardiac exam shows a regular rate and rhythm with normal heart sounds. Chest incision is healing well and sternum is stable. The leg incisions are healing well and there is no peripheral edema.    Diagnostic Tests:  CLINICAL DATA: History of CABG, chest tenderness  EXAM: CHEST 2 VIEW  COMPARISON: Chest x-ray of 04/23/2016  FINDINGS: The lungs appear much better aerated. Only a tiny right pleural effusion remains. Mediastinal and hilar contours are unremarkable. Median sternotomy sutures are noted from CABG. No bony abnormality is seen.  IMPRESSION: Improved aeration. Tiny right pleural effusion remains.   Electronically Signed  By: Paul Barry M.D.  On: 05/25/2016 12:14   Impression:  Overall I think he is doing well. I encouraged him to continue walking. He is planning to participate in cardiac rehab. I told him he could drive his car but should not lift anything heavier than 10 lbs for three months postop.   Plan:  He will continue to follow up with Dr. Berry and Dr. Pharr and will contact me if he develops any problems with his incision.   Bryan K Bartle, MD [facility] [phone]  

## 2016-05-30 DIAGNOSIS — E1140 Type 2 diabetes mellitus with diabetic neuropathy, unspecified: Secondary | ICD-10-CM | POA: Diagnosis not present

## 2016-06-20 LAB — ECHO INTRAOPERATIVE TEE: Weight: 2272 oz

## 2016-06-21 ENCOUNTER — Ambulatory Visit: Payer: Medicare Other | Admitting: Cardiovascular Disease

## 2016-06-30 DIAGNOSIS — M791 Myalgia: Secondary | ICD-10-CM | POA: Diagnosis not present

## 2016-07-14 ENCOUNTER — Encounter (INDEPENDENT_AMBULATORY_CARE_PROVIDER_SITE_OTHER): Payer: Self-pay

## 2016-07-14 ENCOUNTER — Ambulatory Visit (HOSPITAL_COMMUNITY)
Admission: RE | Admit: 2016-07-14 | Discharge: 2016-07-14 | Disposition: A | Discharge status: Home / Self Care | Payer: Medicare Other | Source: Ambulatory Visit | Attending: Cardiology | Admitting: Cardiology

## 2016-07-14 DIAGNOSIS — I739 Peripheral vascular disease, unspecified: Secondary | ICD-10-CM | POA: Diagnosis not present

## 2016-07-14 DIAGNOSIS — E1142 Type 2 diabetes mellitus with diabetic polyneuropathy: Secondary | ICD-10-CM | POA: Diagnosis not present

## 2016-07-14 DIAGNOSIS — E785 Hyperlipidemia, unspecified: Secondary | ICD-10-CM | POA: Diagnosis not present

## 2016-07-14 DIAGNOSIS — I771 Stricture of artery: Secondary | ICD-10-CM | POA: Diagnosis not present

## 2016-07-14 DIAGNOSIS — N183 Chronic kidney disease, stage 3 (moderate): Secondary | ICD-10-CM | POA: Diagnosis not present

## 2016-07-14 DIAGNOSIS — R938 Abnormal findings on diagnostic imaging of other specified body structures: Secondary | ICD-10-CM | POA: Diagnosis not present

## 2016-07-14 DIAGNOSIS — E1122 Type 2 diabetes mellitus with diabetic chronic kidney disease: Secondary | ICD-10-CM | POA: Diagnosis not present

## 2016-07-14 DIAGNOSIS — I129 Hypertensive chronic kidney disease with stage 1 through stage 4 chronic kidney disease, or unspecified chronic kidney disease: Secondary | ICD-10-CM | POA: Diagnosis not present

## 2016-07-14 DIAGNOSIS — K219 Gastro-esophageal reflux disease without esophagitis: Secondary | ICD-10-CM | POA: Insufficient documentation

## 2016-07-14 DIAGNOSIS — J449 Chronic obstructive pulmonary disease, unspecified: Secondary | ICD-10-CM | POA: Insufficient documentation

## 2016-07-14 DIAGNOSIS — Z09 Encounter for follow-up examination after completed treatment for conditions other than malignant neoplasm: Secondary | ICD-10-CM | POA: Diagnosis present

## 2016-07-22 ENCOUNTER — Telehealth: Payer: Self-pay | Admitting: *Deleted

## 2016-07-22 ENCOUNTER — Encounter: Payer: Self-pay | Admitting: Cardiovascular Disease

## 2016-07-22 ENCOUNTER — Encounter (INDEPENDENT_AMBULATORY_CARE_PROVIDER_SITE_OTHER): Payer: Self-pay

## 2016-07-22 ENCOUNTER — Ambulatory Visit (INDEPENDENT_AMBULATORY_CARE_PROVIDER_SITE_OTHER): Payer: Medicare Other | Admitting: Cardiovascular Disease

## 2016-07-22 ENCOUNTER — Other Ambulatory Visit: Payer: Self-pay | Admitting: *Deleted

## 2016-07-22 VITALS — BP 128/72 | HR 82 | Ht 66.0 in | Wt 137.0 lb

## 2016-07-22 DIAGNOSIS — Z01818 Encounter for other preprocedural examination: Secondary | ICD-10-CM

## 2016-07-22 DIAGNOSIS — Z79899 Other long term (current) drug therapy: Secondary | ICD-10-CM | POA: Diagnosis not present

## 2016-07-22 DIAGNOSIS — I2510 Atherosclerotic heart disease of native coronary artery without angina pectoris: Secondary | ICD-10-CM

## 2016-07-22 DIAGNOSIS — I739 Peripheral vascular disease, unspecified: Secondary | ICD-10-CM

## 2016-07-22 DIAGNOSIS — I152 Hypertension secondary to endocrine disorders: Secondary | ICD-10-CM

## 2016-07-22 DIAGNOSIS — I10 Essential (primary) hypertension: Secondary | ICD-10-CM

## 2016-07-22 DIAGNOSIS — E1159 Type 2 diabetes mellitus with other circulatory complications: Secondary | ICD-10-CM

## 2016-07-22 NOTE — Assessment & Plan Note (Signed)
History of hyperlipidemia and intolerance to statin drugs on a study lipid-lowering drugs through his primary care physician's office

## 2016-07-22 NOTE — Progress Notes (Signed)
07/22/2016 Derrick [name]   23-Aug-1946  [id number]  Primary Physician Derrick Pel, MD Primary Cardiologist: Derrick Harp MD Derrick [name]  HPI:  Derrick [name] is a very pleasant 70 year old thin and fit-appearing married Caucasian male father of 42, grandfather to 3 grandchildren who is accompanied by his wife [name]. He was referred by Derrick [name] for cardiovascular evaluation because of a prior history of coronary stenting. Factors include treated diabetes and hyperlipidemia intolerant to statin therapy. He smoked 45 pack years and quit at the time of his myocardial infarction in 2010. He does have a family history of heart disease with a father and brother both of whom had coronary artery bypass grafting. He'll myocardial infarction back in 2010 and had stenting performed by Derrick [name]. He does get occasional atypical chest pain every other month. There also is a question of moderate mitral regurgitation. A 2-D echocardiogram showed normal LV function with mild MR. The Myoview stress test which was read as intermediate risk with inferior and lateral ischemia. Based on this, the patient was referred for cardiac catheterization to define his anatomy. It should also be noted the patient has been having right hip pain thought to be arthritic for many years. I performed cardiac catheterization on him 03/28/16 revealing a patent proximal LAD stent, 95% ostial circumflex, 70% mid AV groove circumflex a long 70% proximal and mid dominant RCA stenosis. I thought his anatomy was most suited for coronary artery bypass grafting which was performed by Derrick [name] on 04/22/16. The LIMA graft to his OM1, vein to OM 2 and a free RIMA to the RCA. His postoperative course was uncomplicated. He is back to work now denying chest pain or shortness of breath. His major issue now is bilateral hip and calf claudication which is lifestyle limiting   Current Outpatient Prescriptions  Medication Sig  Dispense Refill  . aspirin EC 325 MG EC tablet Take 1 tablet (325 mg total) by mouth daily. 30 tablet 0  . Blood Glucose Monitoring Suppl (ONE TOUCH ULTRA SYSTEM KIT) W/DEVICE KIT Patient must check sugar TID 1 each 0  . glucose blood (ONE TOUCH ULTRA TEST) test strip USE ONE STRIP TO CHECK GLUCOSE 4 TIMES DAILY AS NEEDED 100 each 12  . HYDROcodone-acetaminophen (NORCO/VICODIN) 5-325 MG tablet Take 1 tablet by mouth every 6 (six) hours as needed for moderate pain. 40 tablet 0  . Insulin Human (INSULIN PUMP) SOLN Inject 1 each into the skin continuous. 2.7 small, 5.5 medium, 8.5 large meal settings    . metoprolol tartrate (LOPRESSOR) 25 MG tablet Take 0.5 tablets (12.5 mg total) by mouth 2 (two) times daily. (Patient taking differently: Take 12.5 mg by mouth daily. ) 90 tablet 1  . nitroGLYCERIN (NITROSTAT) 0.4 MG SL tablet Place 1 tablet (0.4 mg total) under the tongue every 5 (five) minutes as needed. (Patient taking differently: Place 0.4 mg under the tongue every 5 (five) minutes as needed for chest pain. ) 20 tablet 1  . omeprazole (PRILOSEC) 20 MG capsule TAKE ONE CAPSULE BY MOUTH ONCE DAILY IN THE MORNING (Patient taking differently: TAKE ONE CAPSULE (20 mg) BY MOUTH ONCE DAILY IN THE MORNING) 90 capsule 3  . ONETOUCH DELICA LANCETS 52D MISC USE ONE  4 TIMES DAILY AS NEEDED 100 each 0  . research study medication Take 1 each by mouth daily. Study drug for cholesterol from Derrick [name]     No current facility-administered medications for this visit.  Allergies  Allergen Reactions  . Atorvastatin     Muscle cramps   . Lovastatin     Muscle cramps  . Simvastatin Other (See Comments)    Muscle pain  . Pravastatin Other (See Comments)    Muscle cramps    Social History   Social History  . Marital status: Married    Spouse name: N/A  . Number of children: N/A  . Years of education: N/A   Occupational History  . Not on file.   Social History Main Topics  . Smoking  status: Former Smoker    Packs/day: 1.00    Years: 50.00    Types: Cigarettes    Quit date: 01/06/2009  . Smokeless tobacco: Never Used     Comment: Counseled to remain smoke free  . Alcohol use No  . Drug use: No  . Sexual activity: No   Other Topics Concern  . Not on file   Social History Narrative   Married, works part time at [employer] in [place], [place].   Former smoker: quit 01/2011.  No alcohol or drugs.   No formal exercise.     Review of Systems: General: negative for chills, fever, night sweats or weight changes.  Cardiovascular: negative for chest pain, dyspnea on exertion, edema, orthopnea, palpitations, paroxysmal nocturnal dyspnea or shortness of breath Dermatological: negative for rash Respiratory: negative for cough or wheezing Urologic: negative for hematuria Abdominal: negative for nausea, vomiting, diarrhea, bright red blood per rectum, melena, or hematemesis Neurologic: negative for visual changes, syncope, or dizziness All other systems reviewed and are otherwise negative except as noted above.    Blood pressure 128/72, pulse 82, height 5' 6" (1.676 m), weight 137 lb (62.1 kg).  General appearance: alert and no distress Neck: no adenopathy, no carotid bruit, no JVD, supple, symmetrical, trachea midline and thyroid not enlarged, symmetric, no tenderness/mass/nodules Lungs: clear to auscultation bilaterally Heart: regular rate and rhythm, S1, S2 normal, no murmur, click, rub or gallop Extremities: extremities normal, atraumatic, no cyanosis or edema  EKG not performed today  ASSESSMENT AND PLAN:   Statin intolerance History of hyperlipidemia and intolerance to statin drugs on a study lipid-lowering drugs through his primary care physician's office  Coronary atherosclerosis History of CAD status post LAD stenting back in 2010 by Dr. Maurene Capes. Myoview stress test performed earlier this year showed inferior lateral ischemia. Based on this I performed  cardiac catheterization on him 03/28/16 revealing a patent LAD stent, 95% ostial circumflex stenosis, 70% mid AV groove circumflex a long 70% proximal mid dominant RCA stenosis. Surgical revascularization was his best option which was performed by Derrick [name] on 04/22/16. He had a LIMA to his first obtuse marginal branch, vein to the second obtuse marginal branch and a free RIMA to the RCA. He started well postop and has recovered. He is back to work and denies chest pain or shortness of breath.  Hypertension associated with diabetes ([place]) History of hypertension with blood pressure measured 128/72. He is on metoprolol. Continue current meds at current dosing  PVD (peripheral vascular disease) ([place]) History of peripheral arterial disease with angiographically documented occluded right common and external iliac artery with a 60% hemodynamically significant left common iliac artery stenosis. Recent Dopplers in addition demonstrated an occluded left SFA. These were done on 07/14/16. His right ABI was 0.57 and left was 0.67. He said was tolerating claudication for years which is symmetric bilaterally and occurs predictably at less than 100 feet, worse when climbing  stairs. We will plan on performing angiography and left common iliac artery stenting. I'm not sure his right common iliac arteries percutaneous intractable because of the low level of reconstitution. He may ultimately need a left-to-right femorofemoral crossover graft.      Derrick Harp MD FACP,FACC,FAHA, [facility] 07/22/2016 8:19 AM

## 2016-07-22 NOTE — Assessment & Plan Note (Signed)
History of CAD status post LAD stenting back in 2010 by Dr. Dickie LaBrody. Myoview stress test performed earlier this year showed inferior lateral ischemia. Based on this I performed cardiac catheterization on him 03/28/16 revealing a patent LAD stent, 95% ostial circumflex stenosis, 70% mid AV groove circumflex a long 70% proximal mid dominant RCA stenosis. Surgical revascularization was his best option which was performed by Dr. Laneta SimmersBartle on 04/22/16. He had a LIMA to his first obtuse marginal branch, vein to the second obtuse marginal branch and a free RIMA to the RCA. He started well postop and has recovered. He is back to work and denies chest pain or shortness of breath.

## 2016-07-22 NOTE — Assessment & Plan Note (Signed)
History of peripheral arterial disease with angiographically documented occluded right common and external iliac artery with a 60% hemodynamically significant left common iliac artery stenosis. Recent Dopplers in addition demonstrated an occluded left SFA. These were done on 07/14/16. His right ABI was 0.57 and left was 0.67. He said was tolerating claudication for years which is symmetric bilaterally and occurs predictably at less than 100 feet, worse when climbing stairs. We will plan on performing angiography and left common iliac artery stenting. I'm not sure his right common iliac arteries percutaneous intractable because of the low level of reconstitution. He may ultimately need a left-to-right femorofemoral crossover graft.

## 2016-07-22 NOTE — Telephone Encounter (Signed)
Left message for Lorin PicketScott regarding procedure schedule for 08/22/16 [facility] 9:30 am [facility]---asked for confirmation.

## 2016-07-22 NOTE — Patient Instructions (Signed)
Medication Instructions:  Your physician recommends that you continue on your current medications as directed. Please refer to the Current Medication list given to you today.  PRIOR TO THE PROCEDURE: PLEASE STOP YOUR INSULIN PUMP  At midnight the night before the procedure the next day.   Testing/Procedures: Dr. Allyson SabalBerry has ordered a peripheral angi[facility] to [facility].  This procedure is going to look at the bloodflow in your lower extremities.  If Dr. Allyson SabalBerry is able to open up the arteries, you will have to spend one night in the hospital.  If he is not able to open the arteries, you will be able to go home that same day.   SCHEDULE FOR September 18TH  After the procedure, you will not be allowed to drive for 3 days or push, pull, or lift anything greater than 10 lbs for one week.    You will be required to have the following tests prior to the procedure:  1. Blood work-the blood work can be done no more than 14 days prior to the procedure.  It can be done at any [facility] lab.  There is one downstairs on the first floor of this building and one in the [facility] building [phone]([address], [address])    2. Chest Xray-the chest xray order has already been placed at the [facility].     *REPS  SCOTT  Puncture site LEFT GROIN   Follow-Up: Your physician recommends that you schedule a follow-up appointment WITH AN EXTENDER WITHIN THE WEEK PRIOR TO THE PROCEDURE.  PLEASE GET YOUR LAB WORK DONE THE DAY OR TWO BEFORE YOUR APPPOINTMENT WITH THE PA.   If you need a refill on your cardiac medications before your next appointment, please call your pharmacy.

## 2016-07-22 NOTE — Assessment & Plan Note (Signed)
History of hypertension with blood pressure measured 128/72. He is on metoprolol. Continue current meds at current dosing

## 2016-08-12 ENCOUNTER — Encounter: Payer: Self-pay | Admitting: Physician Assistant

## 2016-08-15 ENCOUNTER — Ambulatory Visit (INDEPENDENT_AMBULATORY_CARE_PROVIDER_SITE_OTHER): Payer: Medicare Other | Admitting: Physician Assistant

## 2016-08-15 ENCOUNTER — Encounter: Payer: Self-pay | Admitting: Physician Assistant

## 2016-08-15 VITALS — BP 108/69 | HR 77 | Ht 66.0 in | Wt 136.6 lb

## 2016-08-15 DIAGNOSIS — Z79899 Other long term (current) drug therapy: Secondary | ICD-10-CM | POA: Diagnosis not present

## 2016-08-15 DIAGNOSIS — I739 Peripheral vascular disease, unspecified: Secondary | ICD-10-CM | POA: Diagnosis not present

## 2016-08-15 DIAGNOSIS — Z01818 Encounter for other preprocedural examination: Secondary | ICD-10-CM | POA: Diagnosis not present

## 2016-08-15 LAB — CBC WITH DIFFERENTIAL/PLATELET
BASOS PCT: 1 %
Basophils Absolute: 62 cells/uL (ref 0–200)
EOS ABS: 124 {cells}/uL (ref 15–500)
Eosinophils Relative: 2 %
HEMATOCRIT: 39.4 % (ref 38.5–50.0)
HEMOGLOBIN: 12.5 g/dL — AB (ref 13.2–17.1)
LYMPHS ABS: 1550 {cells}/uL (ref 850–3900)
LYMPHS PCT: 25 %
MCH: 26.3 pg — ABNORMAL LOW (ref 27.0–33.0)
MCHC: 31.7 g/dL — ABNORMAL LOW (ref 32.0–36.0)
MCV: 82.9 fL (ref 80.0–100.0)
MONO ABS: 558 {cells}/uL (ref 200–950)
MPV: 10.3 fL (ref 7.5–12.5)
Monocytes Relative: 9 %
Neutro Abs: 3906 cells/uL (ref 1500–7800)
Neutrophils Relative %: 63 %
Platelets: 268 10*3/uL (ref 140–400)
RBC: 4.75 MIL/uL (ref 4.20–5.80)
RDW: 15 % (ref 11.0–15.0)
WBC: 6.2 10*3/uL (ref 3.8–10.8)

## 2016-08-15 LAB — BASIC METABOLIC PANEL
BUN: 15 mg/dL (ref 7–25)
CHLORIDE: 106 mmol/L (ref 98–110)
CO2: 29 mmol/L (ref 20–31)
CREATININE: 1.55 mg/dL — AB (ref 0.70–1.18)
Calcium: 9.9 mg/dL (ref 8.6–10.3)
GLUCOSE: 141 mg/dL — AB (ref 65–99)
POTASSIUM: 5.2 mmol/L (ref 3.5–5.3)
Sodium: 142 mmol/L (ref 135–146)

## 2016-08-15 LAB — PROTIME-INR
INR: 1
Prothrombin Time: 10.5 s (ref 9.0–11.5)

## 2016-08-15 LAB — APTT: aPTT: 25 s (ref 22–34)

## 2016-08-15 NOTE — H&P (Signed)
Cardiology History and Physical   Date:  08/15/2016   ID:  Derrick [name], DOB [birth year], MRN [id number]  PCP:  Horatio Pel, MD  Cardiologist:  Dr Stacy [name], PA-C   Chief Complaint  Patient presents with  . Follow-up    tightness in calf. Pt states no other Sx.    History of Present Illness: Derrick [name] is a 70 y.o. male with a history of NSTEMI w/ DES LAD 2010, CKD III, COPD, DM, HLD (intol statins), HTN, CABG 03/2016 w/ LIMA-OM1, SVG-OM2, RIMA-RCA, PAD w/ R-CIA/EIA 100%, 60% L-CIA  Seen by Dr Gwenlyn Found 08/18 and LE angio w/ L-CIA stent planned  Derrick [name] presents for preprocedure evaluation  Derrick [name] continues to struggle with ambulation. He gets leg pain after short periods of ambulation, left greater than right. He also gets but pain with ambulation as well. He does not have resting leg pain or numbness. His activity significantly limited by this. He has remained off tobacco for over 5 years. He tries to watch his sugars and is compliant with his medications. He is on a study medication for his cholesterol and is compliant with those appointments and medications as well.  He wonders when he will be able to go back to work after the procedure. He wonders when he is supposed to be there for the procedure.   Past Medical History:  Diagnosis Date  . Anginal pain ([place])    occ   . Chronic renal insufficiency, stage III (moderate) 2013   CrCl est high 50s (borderline stage II/III pt denies  . COPD (chronic obstructive pulmonary disease) ([place])    pt denies  . Coronary atherosclerosis of native coronary vessel    S/P NSTEMI 04/11/2009--DEL stent to LAD.  Nuclear stress test 11/2010 NEG, EF normal  . Diabetes mellitus   . Diabetic peripheral neuropathy (HCC)    Painful; 1 vicodin bid very helpful (pt resistant to alternative tx's)  . Early cataracts, bilateral 04/24/12   [name], [place]  . GERD (gastroesophageal reflux disease)    . Heart murmur   . Herpes zoster    Two separate outbreaks--one on left axillary/back region, one on right axillary/back region  . Hyperlipidemia   . Hypertension   . Mitral regurgitation 08/2013   Moderate (echo)  . Myocardial infarction ([place])   . Peripheral arterial disease [facility])     Past Surgical History:  Procedure Laterality Date  . CARDIAC CATHETERIZATION N/A 03/28/2016   Procedure: Left Heart Cath and Coronary Angiography;  Surgeon: Lorretta Harp, MD;  Location: [place] [facility];  Service: Cardiovascular;  Laterality: N/A;  . CARDIOVASCULAR STRESS TEST  11/2010   No ischemia/normal EF  . CARPAL TUNNEL RELEASE Left 05/28/2015   Procedure: LEFT HAND CARPAL TUNNEL RELEASE;  Surgeon: Iran Planas, MD;  Location: [place];  Service: Orthopedics;  Laterality: Left;  . COLONOSCOPY  12/2010   Normal screening colonoscopy; rpt 10 yrs  . CORONARY ARTERY BYPASS GRAFT N/A 04/22/2016   Procedure: CORONARY ARTERY BYPASS GRAFTING (CABG) LIMA to OM1 SVG to OM2 FREE RIMA to RCA  ENDOSCOPIC HARVEST GREATER SAPHENOUS VEIN -Right Thigh;  Surgeon: Gaye Pollack, MD;  Location: [place];  Service: Open Heart Surgery;  Laterality: N/A;  . CORONARY STENT PLACEMENT  04/2009   DES to LAD  . cryotherapy to AK lesion on nose  2/11  . eye exam,diabetic  02/2011   Normal diabetic retinopathy  screening exam at [facility] in [place], [place].  [name] PERIPHERAL VASCULAR CATHETERIZATION N/A 03/28/2016   Procedure: Abdominal Aortogram;  Surgeon: Lorretta Harp, MD;  Location: [place] CV [facility];  Service: Cardiovascular;  Laterality: N/A;  . SPINE SURGERY     L-Spine 2000, C-Spine 2004  . TEE WITHOUT CARDIOVERSION N/A 04/22/2016   Procedure: TRANSESOPHAGEAL ECHOCARDIOGRAM (TEE);  Surgeon: Gaye Pollack, MD;  Location: [place];  Service: Open Heart Surgery;  Laterality: N/A;  . TRANSTHORACIC ECHOCARDIOGRAM  08/2013   LVH, EF 55-65%, no WMA.  Moderate mitral regurg    Current Outpatient Prescriptions    Medication Sig Dispense Refill  . aspirin EC 325 MG EC tablet Take 1 tablet (325 mg total) by mouth daily. 30 tablet 0  . Blood Glucose Monitoring Suppl (ONE TOUCH ULTRA SYSTEM KIT) W/DEVICE KIT Patient must check sugar TID 1 each 0  . glucose blood (ONE TOUCH ULTRA TEST) test strip USE ONE STRIP TO CHECK GLUCOSE 4 TIMES DAILY AS NEEDED 100 each 12  . HYDROcodone-acetaminophen (NORCO/VICODIN) 5-325 MG tablet Take 1 tablet by mouth every 6 (six) hours as needed for moderate pain. 40 tablet 0  . Insulin Human (INSULIN PUMP) SOLN Inject 1 each into the skin continuous. 2.7 small, 5.5 medium, 8.5 large meal settings    . metoprolol tartrate (LOPRESSOR) 25 MG tablet Take 0.5 tablets (12.5 mg total) by mouth 2 (two) times daily. (Patient taking differently: Take 12.5 mg by mouth daily. ) 90 tablet 1  . nitroGLYCERIN (NITROSTAT) 0.4 MG SL tablet Place 1 tablet (0.4 mg total) under the tongue every 5 (five) minutes as needed. (Patient taking differently: Place 0.4 mg under the tongue every 5 (five) minutes as needed for chest pain. ) 20 tablet 1  . omeprazole (PRILOSEC) 20 MG capsule TAKE ONE CAPSULE BY MOUTH ONCE DAILY IN THE MORNING (Patient taking differently: TAKE ONE CAPSULE (20 mg) BY MOUTH ONCE DAILY IN THE MORNING) 90 capsule 3  . ONETOUCH DELICA LANCETS 66A MISC USE ONE  4 TIMES DAILY AS NEEDED 100 each 0  . research study medication Take 1 each by mouth daily. Study drug for cholesterol from Dr. Deland Pretty     No current facility-administered medications for this visit.     Allergies:   Atorvastatin; Lovastatin; Simvastatin; and Pravastatin    Social History:  The patient  reports that he quit smoking about 7 years ago. His smoking use included Cigarettes. He has a 50.00 pack-year smoking history. He has never used smokeless tobacco. He reports that he does not drink alcohol or use drugs.   Family History:  The patient's family history includes Cancer in his mother; Diabetes in his brother,  father, and son; Heart disease in his brother and father; Hyperlipidemia in his son.    ROS:  Please see the history of present illness. All other systems are reviewed and negative.    PHYSICAL EXAM: VS:  BP 108/69   Pulse 77   Ht '5\' 6"'$  (1.676 m)   Wt 136 lb 9.6 oz (62 kg)   BMI 22.05 kg/m  , BMI Body mass index is 22.05 kg/m. GEN: Well nourished, well developed, male in no acute distress  HEENT: normal for age  Neck: no JVD, no carotid bruit, no masses Cardiac: RRR; no murmur, no rubs, or gallops Respiratory:  clear to auscultation bilaterally, normal work of breathing GI: soft, nontender, nondistended, + BS MS: no deformity or atrophy; no edema; distal pulses are 2+ in  both upper extremities, not palpable in both lower extremities   Skin: warm and dry, no rash Neuro:  Strength and sensation are intact Psych: euthymic mood, full affect   EKG:  EKG is not ordered today.   Recent Labs: 03/23/2016: TSH 3.88 04/19/2016: ALT 24 04/23/2016: Magnesium 3.0 04/26/2016: BUN 28; Creatinine, Ser 1.40; Hemoglobin 9.3; Platelets 196; Potassium 4.4; Sodium 137    Lipid Panel    Component Value Date/Time   CHOL 227 (H) 07/02/2014 0824   TRIG 99.0 07/02/2014 0824   HDL 48.90 07/02/2014 0824   CHOLHDL 5 07/02/2014 0824   VLDL 19.8 07/02/2014 0824   LDLCALC 158 (H) 07/02/2014 0824   LDLDIRECT 102.8 03/14/2012 0950     Wt Readings from Last 3 Encounters:  08/15/16 136 lb 9.6 oz (62 kg)  07/22/16 137 lb (62.1 kg)  05/25/16 137 lb (62.1 kg)     Other studies Reviewed: Additional studies/ records that were reviewed today include: Office notes, hospital records and testing..  ASSESSMENT AND PLAN:  1. PAD: He has symptomatic peripheral arterial disease. He is scheduled for angiogram and possible stenting next week. We will draw labs today. Orders have already been written. He will discuss with Dr. Maurene Capes how long he is to be out of work but understands he might out be all week. We will  have him come in 2 hours before the procedure for hydration. We will go ahead and check his labs today. He is to continue current medical therapy.  2. Hyperlipidemia: Can 10 years study medication visits per Dr. Shelia Media   Current medicines are reviewed at length with the patient today.  The patient does not have concerns regarding medicines.  The following changes have been made:  no change  Labs/ tests ordered today include:  No orders of the defined types were placed in this encounter.    Disposition:   FU with Dr. Gwenlyn Found  Signed, Lenoard Aden  08/15/2016 9:31 AM    [place] Phone: [phone]; Fax: [phone]  This note was written with the assistance of speech recognition software. Please excuse any transcriptional errors.

## 2016-08-15 NOTE — Patient Instructions (Addendum)
Call to schedule your appointment for March 2018 with Dr. Allyson SabalBerry.  If you need a refill on your cardiac medications before your next appointment, please call your pharmacy.

## 2016-08-15 NOTE — Progress Notes (Signed)
Cardiology Office Note   Date:  08/15/2016   ID:  Derrick [name], DOB [birth year], MRN [id number]  PCP:  Horatio Pel, MD  Cardiologist:  Dr Stacy [name], PA-C   Chief Complaint  Patient presents with  . Follow-up    tightness in calf. Pt states no other Sx.    History of Present Illness: Derrick [name] is a 70 y.o. male with a history of NSTEMI w/ DES LAD 2010, CKD III, COPD, DM, HLD (intol statins), HTN, CABG 03/2016 w/ LIMA-OM1, SVG-OM2, RIMA-RCA, PAD w/ R-CIA/EIA 100%, 60% L-CIA  Seen by Dr Gwenlyn Found 08/18 and LE angio w/ L-CIA stent planned  Derrick [name] presents for preprocedure evaluation  Derrick [name] continues to struggle with ambulation. He gets leg pain after short periods of ambulation, left greater than right. He also gets but pain with ambulation as well. He does not have resting leg pain or numbness. His activity significantly limited by this. He has remained off tobacco for over 5 years. He tries to watch his sugars and is compliant with his medications. He is on a study medication for his cholesterol and is compliant with those appointments and medications as well.  He wonders when he will be able to go back to work after the procedure. He wonders when he is supposed to be there for the procedure.   Past Medical History:  Diagnosis Date  . Anginal pain ([place])    occ   . Chronic renal insufficiency, stage III (moderate) 2013   CrCl est high 50s (borderline stage II/III pt denies  . COPD (chronic obstructive pulmonary disease) ([place])    pt denies  . Coronary atherosclerosis of native coronary vessel    S/P NSTEMI 04/11/2009--DEL stent to LAD.  Nuclear stress test 11/2010 NEG, EF normal  . Diabetes mellitus   . Diabetic peripheral neuropathy (HCC)    Painful; 1 vicodin bid very helpful (pt resistant to alternative tx's)  . Early cataracts, bilateral 04/24/12   [name], [place]  . GERD (gastroesophageal reflux disease)   .  Heart murmur   . Herpes zoster    Two separate outbreaks--one on left axillary/back region, one on right axillary/back region  . Hyperlipidemia   . Hypertension   . Mitral regurgitation 08/2013   Moderate (echo)  . Myocardial infarction ([place])   . Peripheral arterial disease [facility])     Past Surgical History:  Procedure Laterality Date  . CARDIAC CATHETERIZATION N/A 03/28/2016   Procedure: Left Heart Cath and Coronary Angiography;  Surgeon: Lorretta Harp, MD;  Location: [place] [facility];  Service: Cardiovascular;  Laterality: N/A;  . CARDIOVASCULAR STRESS TEST  11/2010   No ischemia/normal EF  . CARPAL TUNNEL RELEASE Left 05/28/2015   Procedure: LEFT HAND CARPAL TUNNEL RELEASE;  Surgeon: Iran Planas, MD;  Location: [place];  Service: Orthopedics;  Laterality: Left;  . COLONOSCOPY  12/2010   Normal screening colonoscopy; rpt 10 yrs  . CORONARY ARTERY BYPASS GRAFT N/A 04/22/2016   Procedure: CORONARY ARTERY BYPASS GRAFTING (CABG) LIMA to OM1 SVG to OM2 FREE RIMA to RCA  ENDOSCOPIC HARVEST GREATER SAPHENOUS VEIN -Right Thigh;  Surgeon: Gaye Pollack, MD;  Location: [place];  Service: Open Heart Surgery;  Laterality: N/A;  . CORONARY STENT PLACEMENT  04/2009   DES to LAD  . cryotherapy to AK lesion on nose  2/11  . eye exam,diabetic  02/2011   Normal diabetic retinopathy screening  exam at [name] in [place], [place].  Derrick [name] PERIPHERAL VASCULAR CATHETERIZATION N/A 03/28/2016   Procedure: Abdominal Aortogram;  Surgeon: Lorretta Harp, MD;  Location: [place] CV [facility];  Service: Cardiovascular;  Laterality: N/A;  . SPINE SURGERY     L-Spine 2000, C-Spine 2004  . TEE WITHOUT CARDIOVERSION N/A 04/22/2016   Procedure: TRANSESOPHAGEAL ECHOCARDIOGRAM (TEE);  Surgeon: Gaye Pollack, MD;  Location: [place];  Service: Open Heart Surgery;  Laterality: N/A;  . TRANSTHORACIC ECHOCARDIOGRAM  08/2013   LVH, EF 55-65%, no WMA.  Moderate mitral regurg    Current Outpatient Prescriptions    Medication Sig Dispense Refill  . aspirin EC 325 MG EC tablet Take 1 tablet (325 mg total) by mouth daily. 30 tablet 0  . Blood Glucose Monitoring Suppl (ONE TOUCH ULTRA SYSTEM KIT) W/DEVICE KIT Patient must check sugar TID 1 each 0  . glucose blood (ONE TOUCH ULTRA TEST) test strip USE ONE STRIP TO CHECK GLUCOSE 4 TIMES DAILY AS NEEDED 100 each 12  . HYDROcodone-acetaminophen (NORCO/VICODIN) 5-325 MG tablet Take 1 tablet by mouth every 6 (six) hours as needed for moderate pain. 40 tablet 0  . Insulin Human (INSULIN PUMP) SOLN Inject 1 each into the skin continuous. 2.7 small, 5.5 medium, 8.5 large meal settings    . metoprolol tartrate (LOPRESSOR) 25 MG tablet Take 0.5 tablets (12.5 mg total) by mouth 2 (two) times daily. (Patient taking differently: Take 12.5 mg by mouth daily. ) 90 tablet 1  . nitroGLYCERIN (NITROSTAT) 0.4 MG SL tablet Place 1 tablet (0.4 mg total) under the tongue every 5 (five) minutes as needed. (Patient taking differently: Place 0.4 mg under the tongue every 5 (five) minutes as needed for chest pain. ) 20 tablet 1  . omeprazole (PRILOSEC) 20 MG capsule TAKE ONE CAPSULE BY MOUTH ONCE DAILY IN THE MORNING (Patient taking differently: TAKE ONE CAPSULE (20 mg) BY MOUTH ONCE DAILY IN THE MORNING) 90 capsule 3  . ONETOUCH DELICA LANCETS 88B MISC USE ONE  4 TIMES DAILY AS NEEDED 100 each 0  . research study medication Take 1 each by mouth daily. Study drug for cholesterol from Dr. Deland Pretty     No current facility-administered medications for this visit.     Allergies:   Atorvastatin; Lovastatin; Simvastatin; and Pravastatin    Social History:  The patient  reports that he quit smoking about 7 years ago. His smoking use included Cigarettes. He has a 50.00 pack-year smoking history. He has never used smokeless tobacco. He reports that he does not drink alcohol or use drugs.   Family History:  The patient's family history includes Cancer in his mother; Diabetes in his brother,  father, and son; Heart disease in his brother and father; Hyperlipidemia in his son.    ROS:  Please see the history of present illness. All other systems are reviewed and negative.    PHYSICAL EXAM: VS:  BP 108/69   Pulse 77   Ht _0  (1.676 m)   Wt 136 lb 9.6 oz (62 kg)   BMI 22.05 kg/m  , BMI Body mass index is 22.05 kg/m. GEN: Well nourished, well developed, male in no acute distress  HEENT: normal for age  Neck: no JVD, no carotid bruit, no masses Cardiac: RRR; no murmur, no rubs, or gallops Respiratory:  clear to auscultation bilaterally, normal work of breathing GI: soft, nontender, nondistended, + BS MS: no deformity or atrophy; no edema; distal pulses are 2+ in both  upper extremities, not palpable in both lower extremities   Skin: warm and dry, no rash Neuro:  Strength and sensation are intact Psych: euthymic mood, full affect   EKG:  EKG is not ordered today.   Recent Labs: 03/23/2016: TSH 3.88 04/19/2016: ALT 24 04/23/2016: Magnesium 3.0 04/26/2016: BUN 28; Creatinine, Ser 1.40; Hemoglobin 9.3; Platelets 196; Potassium 4.4; Sodium 137    Lipid Panel    Component Value Date/Time   CHOL 227 (H) 07/02/2014 0824   TRIG 99.0 07/02/2014 0824   HDL 48.90 07/02/2014 0824   CHOLHDL 5 07/02/2014 0824   VLDL 19.8 07/02/2014 0824   LDLCALC 158 (H) 07/02/2014 0824   LDLDIRECT 102.8 03/14/2012 0950     Wt Readings from Last 3 Encounters:  08/15/16 136 lb 9.6 oz (62 kg)  07/22/16 137 lb (62.1 kg)  05/25/16 137 lb (62.1 kg)     Other studies Reviewed: Additional studies/ records that were reviewed today include: Office notes, hospital records and testing..  ASSESSMENT AND PLAN:  1. PAD: He has symptomatic peripheral arterial disease. He is scheduled for angiogram and possible stenting next week. We will draw labs today. Orders have already been written. He will discuss with Dr. Maurene Capes how long he is to be out of work but understands he might out be all week. We will  have him come in 2 hours before the procedure for hydration. We will go ahead and check his labs today. He is to continue current medical therapy.  2. Hyperlipidemia: Can 10 years study medication visits per Dr. Shelia Media   Current medicines are reviewed at length with the patient today.  The patient does not have concerns regarding medicines.  The following changes have been made:  no change  Labs/ tests ordered today include:  No orders of the defined types were placed in this encounter.    Disposition:   FU with Dr. Gwenlyn Found  Signed, Lenoard Aden  08/15/2016 9:31 AM    [place] Phone: [phone]; Fax: [phone]  This note was written with the assistance of speech recognition software. Please excuse any transcriptional errors.

## 2016-08-22 ENCOUNTER — Ambulatory Visit (HOSPITAL_COMMUNITY)
Admission: RE | Admit: 2016-08-22 | Discharge: 2016-08-23 | Disposition: A | Discharge status: Home / Self Care | Payer: Medicare Other | Source: Ambulatory Visit | Attending: Cardiovascular Disease | Admitting: Cardiovascular Disease

## 2016-08-22 ENCOUNTER — Encounter (HOSPITAL_COMMUNITY)
Admission: RE | Disposition: A | Discharge status: Home / Self Care | Payer: Self-pay | Source: Ambulatory Visit | Attending: Cardiovascular Disease

## 2016-08-22 ENCOUNTER — Encounter (HOSPITAL_COMMUNITY): Payer: Self-pay | Admitting: General Practice

## 2016-08-22 DIAGNOSIS — E785 Hyperlipidemia, unspecified: Secondary | ICD-10-CM | POA: Diagnosis not present

## 2016-08-22 DIAGNOSIS — I129 Hypertensive chronic kidney disease with stage 1 through stage 4 chronic kidney disease, or unspecified chronic kidney disease: Secondary | ICD-10-CM | POA: Diagnosis not present

## 2016-08-22 DIAGNOSIS — Z833 Family history of diabetes mellitus: Secondary | ICD-10-CM | POA: Diagnosis not present

## 2016-08-22 DIAGNOSIS — Z7982 Long term (current) use of aspirin: Secondary | ICD-10-CM | POA: Insufficient documentation

## 2016-08-22 DIAGNOSIS — I739 Peripheral vascular disease, unspecified: Secondary | ICD-10-CM | POA: Diagnosis present

## 2016-08-22 DIAGNOSIS — I70212 Atherosclerosis of native arteries of extremities with intermittent claudication, left leg: Secondary | ICD-10-CM | POA: Insufficient documentation

## 2016-08-22 DIAGNOSIS — E1142 Type 2 diabetes mellitus with diabetic polyneuropathy: Secondary | ICD-10-CM | POA: Diagnosis not present

## 2016-08-22 DIAGNOSIS — Z8249 Family history of ischemic heart disease and other diseases of the circulatory system: Secondary | ICD-10-CM | POA: Diagnosis not present

## 2016-08-22 DIAGNOSIS — I340 Nonrheumatic mitral (valve) insufficiency: Secondary | ICD-10-CM | POA: Insufficient documentation

## 2016-08-22 DIAGNOSIS — I252 Old myocardial infarction: Secondary | ICD-10-CM | POA: Diagnosis not present

## 2016-08-22 DIAGNOSIS — K219 Gastro-esophageal reflux disease without esophagitis: Secondary | ICD-10-CM | POA: Insufficient documentation

## 2016-08-22 DIAGNOSIS — Z951 Presence of aortocoronary bypass graft: Secondary | ICD-10-CM | POA: Insufficient documentation

## 2016-08-22 DIAGNOSIS — N183 Chronic kidney disease, stage 3 (moderate): Secondary | ICD-10-CM | POA: Insufficient documentation

## 2016-08-22 DIAGNOSIS — Z955 Presence of coronary angioplasty implant and graft: Secondary | ICD-10-CM | POA: Insufficient documentation

## 2016-08-22 DIAGNOSIS — J449 Chronic obstructive pulmonary disease, unspecified: Secondary | ICD-10-CM | POA: Diagnosis not present

## 2016-08-22 DIAGNOSIS — Z789 Other specified health status: Secondary | ICD-10-CM

## 2016-08-22 DIAGNOSIS — Z87891 Personal history of nicotine dependence: Secondary | ICD-10-CM | POA: Insufficient documentation

## 2016-08-22 DIAGNOSIS — Z794 Long term (current) use of insulin: Secondary | ICD-10-CM | POA: Diagnosis not present

## 2016-08-22 DIAGNOSIS — E1122 Type 2 diabetes mellitus with diabetic chronic kidney disease: Secondary | ICD-10-CM | POA: Insufficient documentation

## 2016-08-22 DIAGNOSIS — I152 Hypertension secondary to endocrine disorders: Secondary | ICD-10-CM | POA: Diagnosis present

## 2016-08-22 DIAGNOSIS — E1151 Type 2 diabetes mellitus with diabetic peripheral angiopathy without gangrene: Secondary | ICD-10-CM | POA: Diagnosis not present

## 2016-08-22 DIAGNOSIS — Z959 Presence of cardiac and vascular implant and graft, unspecified: Secondary | ICD-10-CM

## 2016-08-22 DIAGNOSIS — I10 Essential (primary) hypertension: Secondary | ICD-10-CM

## 2016-08-22 DIAGNOSIS — I2510 Atherosclerotic heart disease of native coronary artery without angina pectoris: Secondary | ICD-10-CM | POA: Diagnosis not present

## 2016-08-22 DIAGNOSIS — E118 Type 2 diabetes mellitus with unspecified complications: Secondary | ICD-10-CM

## 2016-08-22 DIAGNOSIS — E1159 Type 2 diabetes mellitus with other circulatory complications: Secondary | ICD-10-CM | POA: Diagnosis present

## 2016-08-22 HISTORY — PX: ILIAC VEIN ANGIOPLASTY / STENTING: SHX1788

## 2016-08-22 HISTORY — PX: PERIPHERAL VASCULAR CATHETERIZATION: SHX172C

## 2016-08-22 HISTORY — DX: Presence of cardiac and vascular implant and graft, unspecified: Z95.9

## 2016-08-22 LAB — GLUCOSE, CAPILLARY
GLUCOSE-CAPILLARY: 128 mg/dL — AB (ref 65–99)
Glucose-Capillary: 74 mg/dL (ref 65–99)
Glucose-Capillary: 111 mg/dL — ABNORMAL HIGH (ref 65–99)
Glucose-Capillary: 142 mg/dL — ABNORMAL HIGH (ref 65–99)

## 2016-08-22 LAB — BASIC METABOLIC PANEL
ANION GAP: 9 (ref 5–15)
BUN: 11 mg/dL (ref 6–20)
CALCIUM: 9.2 mg/dL (ref 8.9–10.3)
CHLORIDE: 107 mmol/L (ref 101–111)
CO2: 25 mmol/L (ref 22–32)
Creatinine, Ser: 1.59 mg/dL — ABNORMAL HIGH (ref 0.61–1.24)
GFR calc non Af Amer: 42 mL/min — ABNORMAL LOW (ref >60)
GFR, EST AFRICAN AMERICAN: 49 mL/min — AB (ref >60)
Glucose, Bld: 93 mg/dL (ref 65–99)
Potassium: 4 mmol/L (ref 3.5–5.1)
Sodium: 141 mmol/L (ref 135–145)

## 2016-08-22 LAB — POCT ACTIVATED CLOTTING TIME
ACTIVATED CLOTTING TIME: 202 s
ACTIVATED CLOTTING TIME: 158 s
Activated Clotting Time: 191 seconds
Activated Clotting Time: 175 seconds

## 2016-08-22 SURGERY — LOWER EXTREMITY ANGIOGRAPHY
Anesthesia: LOCAL | Laterality: Left

## 2016-08-22 MED ORDER — ASPIRIN EC 325 MG PO TBEC: 325.0000 mg | DELAYED_RELEASE_TABLET | Freq: Every day | ORAL | Status: DC

## 2016-08-22 MED ORDER — SODIUM CHLORIDE 0.9 % WEIGHT BASED INFUSION: 1.0000 mL/kg/h | INTRAVENOUS | Status: DC

## 2016-08-22 MED ORDER — HEPARIN (PORCINE) IN NACL 2-0.9 UNIT/ML-% IJ SOLN
INTRAMUSCULAR | Status: AC
  Filled 2016-08-22: qty 1000

## 2016-08-22 MED ORDER — FENTANYL CITRATE (PF) 100 MCG/2ML IJ SOLN
INTRAMUSCULAR | Status: AC
  Filled 2016-08-22: qty 2

## 2016-08-22 MED ORDER — LIDOCAINE HCL (PF) 1 % IJ SOLN
INTRAMUSCULAR | Status: DC | PRN
  Administered 2016-08-22: 2 mL
  Administered 2016-08-22: 8 mL

## 2016-08-22 MED ORDER — ATROPINE SULFATE 1 MG/10ML IJ SOSY
PREFILLED_SYRINGE | INTRAMUSCULAR | Status: AC
  Filled 2016-08-22: qty 10

## 2016-08-22 MED ORDER — STUDY - INVESTIGATIONAL DRUG SIMPLE RECORD
1.0000 | Freq: Every day | Status: DC
  Administered 2016-08-23: 5000 mg via ORAL
  Filled 2016-08-22: qty 1

## 2016-08-22 MED ORDER — FENTANYL CITRATE (PF) 100 MCG/2ML IJ SOLN
INTRAMUSCULAR | Status: DC | PRN
  Administered 2016-08-22: 25 ug via INTRAVENOUS

## 2016-08-22 MED ORDER — HEPARIN SODIUM (PORCINE) 1000 UNIT/ML IJ SOLN
INTRAMUSCULAR | Status: AC
  Filled 2016-08-22: qty 1

## 2016-08-22 MED ORDER — LIDOCAINE HCL (PF) 1 % IJ SOLN
INTRAMUSCULAR | Status: AC
  Filled 2016-08-22: qty 30

## 2016-08-22 MED ORDER — CLOPIDOGREL BISULFATE 75 MG PO TABS
75.0000 mg | ORAL_TABLET | Freq: Every day | ORAL | Status: DC
  Administered 2016-08-23: 75 mg via ORAL
  Filled 2016-08-22: qty 1

## 2016-08-22 MED ORDER — SODIUM CHLORIDE 0.9 % WEIGHT BASED INFUSION
3.0000 mL/kg/h | INTRAVENOUS | Status: DC
  Administered 2016-08-22: 3 mL/kg/h via INTRAVENOUS

## 2016-08-22 MED ORDER — HYDROCODONE-ACETAMINOPHEN 5-325 MG PO TABS: 1.0000 | ORAL_TABLET | Freq: Four times a day (QID) | ORAL | Status: DC | PRN

## 2016-08-22 MED ORDER — METOPROLOL TARTRATE 12.5 MG HALF TABLET
12.5000 mg | ORAL_TABLET | Freq: Two times a day (BID) | ORAL | Status: DC
  Administered 2016-08-23: 12.5 mg via ORAL
  Filled 2016-08-22 (×2): qty 1

## 2016-08-22 MED ORDER — HYDRALAZINE HCL 20 MG/ML IJ SOLN: 10.0000 mg | INTRAMUSCULAR | Status: DC | PRN

## 2016-08-22 MED ORDER — ASPIRIN 81 MG PO CHEW: 81.0000 mg | CHEWABLE_TABLET | ORAL | Status: DC

## 2016-08-22 MED ORDER — SODIUM CHLORIDE 0.9 % IV SOLN
INTRAVENOUS | Status: AC
  Administered 2016-08-22: 16:00:00 via INTRAVENOUS

## 2016-08-22 MED ORDER — HEPARIN (PORCINE) IN NACL 2-0.9 UNIT/ML-% IJ SOLN
INTRAMUSCULAR | Status: DC | PRN
  Administered 2016-08-22: 1000 mL via INTRA_ARTERIAL

## 2016-08-22 MED ORDER — ASPIRIN EC 81 MG PO TBEC
81.0000 mg | DELAYED_RELEASE_TABLET | Freq: Every day | ORAL | Status: DC
  Administered 2016-08-23: 09:00:00 81 mg via ORAL
  Filled 2016-08-22: qty 1

## 2016-08-22 MED ORDER — ONDANSETRON HCL 4 MG/2ML IJ SOLN: 4.0000 mg | Freq: Four times a day (QID) | INTRAMUSCULAR | Status: DC | PRN

## 2016-08-22 MED ORDER — ACETAMINOPHEN 325 MG PO TABS: 650.0000 mg | ORAL_TABLET | ORAL | Status: DC | PRN

## 2016-08-22 MED ORDER — IODIXANOL 320 MG/ML IV SOLN
INTRAVENOUS | Status: DC | PRN
  Administered 2016-08-22: 210 mL via INTRA_ARTERIAL

## 2016-08-22 MED ORDER — CLOPIDOGREL BISULFATE 300 MG PO TABS
ORAL_TABLET | ORAL | Status: DC | PRN
  Administered 2016-08-22: 300 mg via ORAL

## 2016-08-22 MED ORDER — INSULIN PUMP
1.0000 | SUBCUTANEOUS | Status: DC
  Administered 2016-08-22: 16:00:00 4.5 via SUBCUTANEOUS
  Filled 2016-08-22: qty 1

## 2016-08-22 MED ORDER — MIDAZOLAM HCL 2 MG/2ML IJ SOLN
INTRAMUSCULAR | Status: DC | PRN
  Administered 2016-08-22: 1 mg via INTRAVENOUS

## 2016-08-22 MED ORDER — HEPARIN SODIUM (PORCINE) 1000 UNIT/ML IJ SOLN
INTRAMUSCULAR | Status: DC | PRN
  Administered 2016-08-22: 2500 [IU] via INTRAVENOUS
  Administered 2016-08-22: 6000 [IU] via INTRAVENOUS

## 2016-08-22 MED ORDER — CLOPIDOGREL BISULFATE 300 MG PO TABS
ORAL_TABLET | ORAL | Status: AC
  Filled 2016-08-22: qty 1

## 2016-08-22 MED ORDER — SODIUM CHLORIDE 0.9% FLUSH: 3.0000 mL | INTRAVENOUS | Status: DC | PRN

## 2016-08-22 MED ORDER — ANGIOPLASTY BOOK
Freq: Once | Status: DC
  Filled 2016-08-22: qty 1

## 2016-08-22 MED ORDER — MORPHINE SULFATE (PF) 2 MG/ML IV SOLN: 2.0000 mg | INTRAVENOUS | Status: DC | PRN

## 2016-08-22 MED ORDER — MIDAZOLAM HCL 2 MG/2ML IJ SOLN
INTRAMUSCULAR | Status: AC
  Filled 2016-08-22: qty 2

## 2016-08-22 MED ORDER — PANTOPRAZOLE SODIUM 40 MG PO TBEC
40.0000 mg | DELAYED_RELEASE_TABLET | Freq: Every day | ORAL | Status: DC
  Administered 2016-08-23: 09:00:00 40 mg via ORAL
  Filled 2016-08-22: qty 1

## 2016-08-22 SURGICAL SUPPLY — 24 items
BALLN ARMADA 7X20X80 (BALLOONS) ×3
BALLN MUSTANG 4.0X40 75 (BALLOONS) ×3
BALLOON ARMADA 7X20X80 (BALLOONS) ×2 IMPLANT
BALLOON MUSTANG 4.0X40 75 (BALLOONS) ×2 IMPLANT
CATH ANGIO 5F PIGTAIL 65CM (CATHETERS) ×3 IMPLANT
CATH STRAIGHT 5FR 65CM (CATHETERS) ×3 IMPLANT
COVER PRB 48X5XTLSCP FOLD TPE (BAG) ×2 IMPLANT
COVER PROBE 5X48 (BAG) ×3
DEVICE CONTINUOUS FLUSH (MISCELLANEOUS) ×3 IMPLANT
KIT ENCORE 26 ADVANTAGE (KITS) ×3 IMPLANT
KIT MICROINTRODUCER STIFF 5F (SHEATH) ×3 IMPLANT
KIT PV (KITS) ×3 IMPLANT
SHEATH BRITE TIP 7FR 35CM (SHEATH) ×3 IMPLANT
SHEATH PINNACLE 5F 10CM (SHEATH) ×3 IMPLANT
SHEATH PINNACLE 7F 10CM (SHEATH) ×3 IMPLANT
STENT LIFESTREAM 6X26X80 (Permanent Stent) ×3 IMPLANT
STOPCOCK MORSE 400PSI 3WAY (MISCELLANEOUS) ×3 IMPLANT
SYRINGE MEDRAD AVANTA MACH 7 (SYRINGE) ×3 IMPLANT
TAPE RADIOPAQUE TURBO (MISCELLANEOUS) ×3 IMPLANT
TRANSDUCER W/STOPCOCK (MISCELLANEOUS) ×3 IMPLANT
TRAY PV CATH (CUSTOM PROCEDURE TRAY) ×3 IMPLANT
TUBING CIL FLEX 10 FLL-RA (TUBING) ×3 IMPLANT
WIRE HITORQ VERSACORE ST 145CM (WIRE) ×3 IMPLANT
WIRE VIPER ADVANCE .017X335CM (WIRE) ×3 IMPLANT

## 2016-08-22 NOTE — Interval H&P Note (Signed)
History and Physical Interval Note:  08/22/2016 9:43 AM  Derrick [name]  has presented today for surgery, with the diagnosis of pvd  The various methods of treatment have been discussed with the patient and family. After consideration of risks, benefits and other options for treatment, the patient has consented to  Procedure(s): Lower Extremity Angiography (N/A) as a surgical intervention .  The patient's history has been reviewed, patient examined, no change in status, stable for surgery.  I have reviewed the patient's chart and labs.  Questions were answered to the patient's satisfaction.     Nanetta BattyBerry, Tambi Thole

## 2016-08-22 NOTE — Care Management Note (Signed)
Case Management Note  Patient Details  Name: Donley RedderJerry E Morissette MRN: [id number] Date of Birth: 10/08/1946  Subjective/Objective:  Patient is s/p cath, will be on plavix, NCM will cont to follow for dc needs.                   Action/Plan:   Expected Discharge Date:                  Expected Discharge Plan:  Home/Self Care  In-House Referral:     Discharge planning Services  CM Consult  Post Acute Care Choice:    Choice offered to:     DME Arranged:    DME Agency:     HH Arranged:    HH Agency:     Status of Service:  In process, will continue to follow  If discussed at Long Length of Stay Meetings, dates discussed:    Additional Comments:  Leone Havenaylor, Zynasia Burklow [place], RN 08/22/2016, 4:05 PM

## 2016-08-22 NOTE — Progress Notes (Signed)
Site area: left groin  Site Prior to Removal:  Level 0  Pressure Applied For 25 MINUTES    Minutes Beginning at 1235  Manual:   Yes.    Patient Status During Pull:  Patient remains A&O by four.  Post Pull Groin Site:  Level 0  Post Pull Instructions Given:  Yes.    Post Pull Pulses Present:  Yes.    Dressing Applied:  Yes.    Comments:  Patient tolerated procedure well. Pressure dressing applied. NO hematoma noted. No bleeding noted. Call bell is in reach and bed is in lowest position.

## 2016-08-23 ENCOUNTER — Other Ambulatory Visit: Payer: Self-pay | Admitting: Cardiology

## 2016-08-23 ENCOUNTER — Encounter (HOSPITAL_COMMUNITY): Payer: Self-pay | Admitting: Cardiology

## 2016-08-23 DIAGNOSIS — E1151 Type 2 diabetes mellitus with diabetic peripheral angiopathy without gangrene: Secondary | ICD-10-CM | POA: Diagnosis not present

## 2016-08-23 DIAGNOSIS — E1122 Type 2 diabetes mellitus with diabetic chronic kidney disease: Secondary | ICD-10-CM | POA: Diagnosis not present

## 2016-08-23 DIAGNOSIS — I739 Peripheral vascular disease, unspecified: Secondary | ICD-10-CM | POA: Diagnosis not present

## 2016-08-23 DIAGNOSIS — Z794 Long term (current) use of insulin: Secondary | ICD-10-CM | POA: Diagnosis not present

## 2016-08-23 DIAGNOSIS — Z955 Presence of coronary angioplasty implant and graft: Secondary | ICD-10-CM | POA: Diagnosis not present

## 2016-08-23 DIAGNOSIS — I70212 Atherosclerosis of native arteries of extremities with intermittent claudication, left leg: Secondary | ICD-10-CM | POA: Diagnosis not present

## 2016-08-23 DIAGNOSIS — I2510 Atherosclerotic heart disease of native coronary artery without angina pectoris: Secondary | ICD-10-CM | POA: Diagnosis not present

## 2016-08-23 DIAGNOSIS — Z833 Family history of diabetes mellitus: Secondary | ICD-10-CM | POA: Diagnosis not present

## 2016-08-23 DIAGNOSIS — K219 Gastro-esophageal reflux disease without esophagitis: Secondary | ICD-10-CM | POA: Diagnosis not present

## 2016-08-23 DIAGNOSIS — Z7982 Long term (current) use of aspirin: Secondary | ICD-10-CM | POA: Diagnosis not present

## 2016-08-23 DIAGNOSIS — Z87891 Personal history of nicotine dependence: Secondary | ICD-10-CM | POA: Diagnosis not present

## 2016-08-23 DIAGNOSIS — I340 Nonrheumatic mitral (valve) insufficiency: Secondary | ICD-10-CM | POA: Diagnosis not present

## 2016-08-23 DIAGNOSIS — E1142 Type 2 diabetes mellitus with diabetic polyneuropathy: Secondary | ICD-10-CM | POA: Diagnosis not present

## 2016-08-23 DIAGNOSIS — Z951 Presence of aortocoronary bypass graft: Secondary | ICD-10-CM | POA: Diagnosis not present

## 2016-08-23 DIAGNOSIS — Z959 Presence of cardiac and vascular implant and graft, unspecified: Secondary | ICD-10-CM

## 2016-08-23 DIAGNOSIS — I129 Hypertensive chronic kidney disease with stage 1 through stage 4 chronic kidney disease, or unspecified chronic kidney disease: Secondary | ICD-10-CM | POA: Diagnosis not present

## 2016-08-23 DIAGNOSIS — I252 Old myocardial infarction: Secondary | ICD-10-CM | POA: Diagnosis not present

## 2016-08-23 DIAGNOSIS — N183 Chronic kidney disease, stage 3 (moderate): Secondary | ICD-10-CM | POA: Diagnosis not present

## 2016-08-23 DIAGNOSIS — J449 Chronic obstructive pulmonary disease, unspecified: Secondary | ICD-10-CM | POA: Diagnosis not present

## 2016-08-23 DIAGNOSIS — E785 Hyperlipidemia, unspecified: Secondary | ICD-10-CM | POA: Diagnosis not present

## 2016-08-23 DIAGNOSIS — Z8249 Family history of ischemic heart disease and other diseases of the circulatory system: Secondary | ICD-10-CM | POA: Diagnosis not present

## 2016-08-23 HISTORY — DX: Presence of cardiac and vascular implant and graft, unspecified: Z95.9

## 2016-08-23 LAB — CBC
HCT: 37.9 % — ABNORMAL LOW (ref 39.0–52.0)
Hemoglobin: 11.7 g/dL — ABNORMAL LOW (ref 13.0–17.0)
MCH: 26.2 pg (ref 26.0–34.0)
MCHC: 30.9 g/dL (ref 30.0–36.0)
MCV: 85 fL (ref 78.0–100.0)
PLATELETS: 218 10*3/uL (ref 150–400)
RBC: 4.46 MIL/uL (ref 4.22–5.81)
RDW: 15.6 % — AB (ref 11.5–15.5)
WBC: 5.3 10*3/uL (ref 4.0–10.5)

## 2016-08-23 LAB — BASIC METABOLIC PANEL
Anion gap: 7 (ref 5–15)
BUN: 16 mg/dL (ref 6–20)
CALCIUM: 9.2 mg/dL (ref 8.9–10.3)
CHLORIDE: 105 mmol/L (ref 101–111)
CO2: 29 mmol/L (ref 22–32)
CREATININE: 1.63 mg/dL — AB (ref 0.61–1.24)
GFR, EST AFRICAN AMERICAN: 48 mL/min — AB (ref >60)
GFR, EST NON AFRICAN AMERICAN: 41 mL/min — AB (ref >60)
Glucose, Bld: 84 mg/dL (ref 65–99)
Potassium: 4.4 mmol/L (ref 3.5–5.1)
SODIUM: 141 mmol/L (ref 135–145)

## 2016-08-23 LAB — GLUCOSE, CAPILLARY
GLUCOSE-CAPILLARY: 64 mg/dL — AB (ref 65–99)
Glucose-Capillary: 70 mg/dL (ref 65–99)

## 2016-08-23 MED ORDER — ACETAMINOPHEN 325 MG PO TABS: 650.0000 mg | ORAL_TABLET | ORAL | Status: DC | PRN

## 2016-08-23 MED ORDER — CLOPIDOGREL BISULFATE 75 MG PO TABS: 75.0000 mg | ORAL_TABLET | Freq: Every day | ORAL | 11 refills | Status: DC

## 2016-08-23 MED ORDER — ASPIRIN 81 MG PO TBEC: 81.0000 mg | DELAYED_RELEASE_TABLET | Freq: Every day | ORAL | Status: DC

## 2016-08-23 MED ORDER — PANTOPRAZOLE SODIUM 40 MG PO TBEC: 40.0000 mg | DELAYED_RELEASE_TABLET | Freq: Every day | ORAL | 6 refills | Status: DC

## 2016-08-23 NOTE — Discharge Summary (Signed)
Physician Discharge Summary       Derrick [name] ID: Derrick [name] MRN: [id number] DOB/AGE: 71-Jun-1947 71 y.o.  Admit date: 08/22/2016 Discharge date: 08/23/2016 Primary Cardiologist:Dr. Erlene Quan   Discharge Diagnoses:  Principal Problem:   PVD (peripheral vascular disease) (HCC) Active Problems:   S/P arterial stent, 08/22/16 Lt [facility] PTA/Stent    Statin intolerance   Coronary atherosclerosis   Diabetes mellitus with complication, with long-term current use of insulin (HCC)   Hypertension associated with diabetes (HCC)   Claudication (HCC)   Discharged Condition: good  Procedures: 08/22/16 by Dr. Allyson Sabal   Angiographic Data:   1: Abdominal aortogram-the distal abdominal aorta was small in caliber but there was no visible atherosclerotic or aneurysmal changes 2: Left lower extremity-there was a fairly long segmental moderately sedated severe ostial/proximal left common iliac artery stenosis with some post stenotic dilatation. The left SFA was functional occluded at its origin reconstituting in the adductor canal by profunda femoris collaterals. There was three-vessel runoff 3: Right lower extremity-the proximal right common iliac artery was occluded. It reconstituted in the distal common iliac just before the takeoff of the hypogastric artery by lumbar and left hypogastric collaterals. Left SFA was widely patent and there was three-vessel runoff.  IMPRESSION: Derrick [name] has high-grade ostial/proximal segmental left common iliac artery stenosis. Upon further inspection the disease segment does not appear to be fluoroscopically calcified. Originally the intent was to perform [employer] orbital facial arthrectomy however this was changed to PTA and covered stenting.  Procedure Description: The 5 French sheath was exchanged over a 0.35 cm wire for a 7 French bright tip sheath. The Derrick [name] received a total of 8500 units of heparin with an ending ACT of 202. Total contrast administered to the  Derrick [name] was 210 mL. The lesion was predilated with a 4 mm x 4 SciMed balloon. Following this a 6 mm x 26 mm long Bard lifestream Her stent was carefully positioned and deployed at the ostium of the left common iliac artery. This was then postdilated with a 7 mm x 2 cm balloon at nominal pressures resulting reduction of an 80% segmental proximal left common iliac artery stenosis to 0% residual. There is no apparent gradient at the end of the procedure. The bright tip sheath was exchanged over wire for a short 7 [place] sheath. The Derrick [name] received 300 mg of by mouth Plavix. Sheath was secured. The Derrick [name] left the lab in stable condition.  Final Impression: Successful left common iliac artery PTA and covered stenting of a high-grade hemodynamically significant ostial/proximal left common iliac artery stenosis. Derrick [name] does have a total left SFA and a total right common iliac artery. The right common iliac artery appears percutaneously addressable but this will be done in a staged fashion given his renal insufficiency. The sheath will be removed and pressure held with a history of falls below 170. The Derrick [name] be hydrated overnight, discharged home in the morning on aspirin and Plavix. A Doppler will be performed in the [facility] office next week. I will see Derrick [name] back one to 2 weeks thereafter I will arrange for his staged right iliac intervention.    Hospital Course:  70 yr old male referred by Dr. Renne Crigler for cardiovascular evaluation because of a prior history of coronary stenting. Factors include treated diabetes and hyperlipidemia intolerant to statin therapy. He smoked 45 pack years and quit at the time of his myocardial infarction in 2010. He does have a family history of heart disease with a father and brother  both of whom had coronary artery bypass grafting. He'll myocardial infarction back in 2010 and had stenting performed by Dr. Olevia Perches. He does get occasional atypical chest pain every other month.  There also is a question of moderate mitral regurgitation. A 2-D echocardiogram showed normal LV function with mild MR. The Myoview stress test which was read as intermediate risk with inferior and lateral ischemia. Based on this, the Derrick [name] was referred for cardiac catheterization to define his anatomy. It should also be noted the Derrick [name] has been having right hip pain thought to be arthritic for many years. Dr. Adora Fridge  performed cardiac catheterization on Derrick [name] 03/28/16 revealing a patent proximal LAD stent, 95% ostial circumflex, 70% mid AV groove circumflex a long 70% proximal and mid dominant RCA stenosis. I thought his anatomy was most suited for coronary artery bypass grafting which was performed by Dr. Cyndia Bent on 04/22/16. The LIMA graft to his OM1, vein to OM 2 and a free RIMA to the RCA. His postoperative course was uncomplicated. He is back to work now denying chest pain or shortness of breath. His major issue now is bilateral hip and calf claudication which is lifestyle limiting. He presented yesterday for angiography and potential left common iliac artery intervention.  He is allergic to statins but on research study med for cholesterol.   Procedure as above but successful left common iliac artery PTA and covered stenting of a high-grade hemodynamically significant ostial/proximal left common iliac artery stenosis. Derrick [name] does have a total left SFA and a total right common iliac artery. The right common iliac artery appears percutaneously addressable but this will be done in a staged fashion given his renal insufficiency.    Pt did well post procedure and is seen and found stable by Dr. Claiborne Billings.  Will be d/c'd on ASA and plavix, will have doppler next week and Dr. Adora Fridge in 2 weeks to arrange for staged Rt iliac intervention.  Cr. Is up slightly post cath.   Tele: with short burst of PACs.   Consults: None  Significant Diagnostic Studies:  BMP Latest Ref Rng & Units 08/23/2016 08/22/2016  08/15/2016  Glucose 65 - 99 mg/dL 84 93 141(H)  BUN 6 - 20 mg/dL '16 11 15  '$ Creatinine 0.61 - 1.24 mg/dL 1.63(H) 1.59(H) 1.55(H)  Sodium 135 - 145 mmol/L 141 141 142  Potassium 3.5 - 5.1 mmol/L 4.4 4.0 5.2  Chloride 101 - 111 mmol/L 105 107 106  CO2 22 - 32 mmol/L '29 25 29  '$ Calcium 8.9 - 10.3 mg/dL 9.2 9.2 9.9   CBC Latest Ref Rng & Units 08/23/2016 08/15/2016 04/26/2016  WBC 4.0 - 10.5 K/uL 5.3 6.2 10.0  Hemoglobin 13.0 - 17.0 g/dL 11.7(L) 12.5(L) 9.3(L)  Hematocrit 39.0 - 52.0 % 37.9(L) 39.4 28.1(L)  Platelets 150 - 400 K/uL 218 268 196     Discharge Exam: Blood pressure (!) 95/59, pulse 86, temperature 98 F (36.7 C), temperature source Oral, resp. rate 20, height '5\' 6"'$  (1.676 m), weight 134 lb 7.7 oz (61 kg), SpO2 98 %. General:Pleasant affect, NAD, stated his lt leg felt better.  Skin:Warm and dry, brisk capillary refill HEENT:normocephalic, sclera clear, mucus membranes moist Neck:supple, no JVD Heart:S1S2 RRR with soft systolic murmur, no gallup, rub or click Lungs:clear without rales, rhonchi, or wheezes QPY:PPJK, non tender, + BS, do not palpate liver spleen or masses Ext:no lower ext edema,  2+ radial pulses, lt groin with small hematoma, no pain  Neuro:alert and oriented X 3, MAE, follows  commands, + facial symmetry  Disposition: 01-Home or Self Care     Medication List    STOP taking these medications   omeprazole 20 MG capsule Commonly known as:  PRILOSEC Replaced by:  pantoprazole 40 MG tablet     TAKE these medications   acetaminophen 325 MG tablet Commonly known as:  TYLENOL Take 2 tablets (650 mg total) by mouth every 4 (four) hours as needed for headache or mild pain.   aspirin 81 MG EC tablet Take 1 tablet (81 mg total) by mouth daily. What changed:  medication strength  how much to take   clopidogrel 75 MG tablet Commonly known as:  PLAVIX Take 1 tablet (75 mg total) by mouth daily with breakfast.   glucose blood test strip Commonly known as:   ONE TOUCH ULTRA TEST USE ONE STRIP TO CHECK GLUCOSE 4 TIMES DAILY AS NEEDED   HYDROcodone-acetaminophen 5-325 MG tablet Commonly known as:  NORCO/VICODIN Take 1 tablet by mouth every 6 (six) hours as needed for moderate pain.   insulin pump Soln Inject 1 each into the skin continuous. 2.7 small, 5.5 medium, 8.5 large meal settings  Humalog   metoprolol tartrate 25 MG tablet Commonly known as:  LOPRESSOR Take 0.5 tablets (12.5 mg total) by mouth 2 (two) times daily. What changed:  when to take this   nitroGLYCERIN 0.4 MG SL tablet Commonly known as:  NITROSTAT Place 1 tablet (0.4 mg total) under the tongue every 5 (five) minutes as needed. What changed:  reasons to take this   ONE TOUCH ULTRA SYSTEM KIT w/Device Kit Derrick [name] must check sugar TID   ONETOUCH DELICA LANCETS 33G Misc USE ONE  4 TIMES DAILY AS NEEDED   pantoprazole 40 MG tablet Commonly known as:  PROTONIX Take 1 tablet (40 mg total) by mouth daily. Replaces:  omeprazole 20 MG capsule   research study medication Take 1 each by mouth daily. Study drug for cholesterol from Dr. Merri Brunette      Follow-up Information    [facility] Follow up on 09/02/2016.   Specialty:  Cardiology Why:  at 1100 AM and 1200 for dopplers of Lt leg  Contact information: [address] [place] [address] [phone]       Nanetta Batty, MD Follow up on 09/09/2016.   Specialties:  Cardiology, Radiology Why:  at 3:30 PM Contact information: [address] [place] [address] [phone]            Discharge Instructions: Call [facility] at [phone] if any bleeding, swelling or drainage at cath site.  May shower, no tub baths for 48 hours for groin sticks. No lifting over 5 pounds for 3 days.  No Driving for 3 days.  May return to work on Thursday, walk around every hour or so.  Heart Healthy diabetic diet  Insulin pump as  before  Call if any questions.     We changed dose of asprin and changed prilosec to protonix    Signed: Nada Boozer Nurse Practitioner-Certified [facility]: [facility] 08/23/2016, 9:08 AM  Time spent on discharge :> 30 minutes.    Derrick [name] seen and examined. Agree with assessment and plan. R groin site stable; no chest pain or dyspnea. S/P successful left common iliac artery PTA and covered stenting of a high-grade hemodynamically significant ostial/proximal left common iliac artery stenosis. Hehas a total left SFA and a total right common iliac artery. Plan for  right  common iliac artery intervention in a staged procedure with underlying renal insufficiency. DC today.   Troy Sine, MD, [facility] 08/23/2016 9:54 AM

## 2016-08-23 NOTE — Care Management Note (Signed)
Case Management Note  Patient Details  Name: Derrick [name] MRN: [id number] Date of Birth: 26-Mar-1946  Subjective/Objective:   Patient is s/p cath, will be on plavix, for dc today, no needs.                  Action/Plan:   Expected Discharge Date:                  Expected Discharge Plan:  Home/Self Care  In-House Referral:     Discharge planning Services  CM Consult  Post Acute Care Choice:    Choice offered to:     DME Arranged:    DME Agency:     HH Arranged:    HH Agency:     Status of Service:  Completed, signed off  If discussed at [employer] Length of Stay Meetings, dates discussed:    Additional Comments:  Leone Havenaylor, Nastasha Reising Clinton, RN 08/23/2016, 11:23 AM

## 2016-08-23 NOTE — Progress Notes (Signed)
Offered Pt a bath, Pt stated he will take care of bath once he arrives home.

## 2016-08-23 NOTE — Discharge Instructions (Signed)
Call [facility] at [phone] if any bleeding, swelling or drainage at cath site.  May shower, no tub baths for 48 hours for groin sticks. No lifting over 5 pounds for 3 days.  No Driving for 3 days.  May return to work on Thursday, walk around every hour or so.  Heart Healthy diabetic diet  Insulin pump as before  Call if any questions.     We changed dose of asprin and changed prilosec to protonix

## 2016-08-29 DIAGNOSIS — E119 Type 2 diabetes mellitus without complications: Secondary | ICD-10-CM | POA: Diagnosis not present

## 2016-09-02 ENCOUNTER — Ambulatory Visit (HOSPITAL_COMMUNITY)
Admit: 2016-09-02 | Discharge: 2016-09-02 | Disposition: A | Discharge status: Home / Self Care | Payer: Medicare Other | Attending: Cardiology | Admitting: Cardiology

## 2016-09-02 DIAGNOSIS — Z959 Presence of cardiac and vascular implant and graft, unspecified: Secondary | ICD-10-CM | POA: Diagnosis not present

## 2016-09-02 DIAGNOSIS — I739 Peripheral vascular disease, unspecified: Secondary | ICD-10-CM

## 2016-09-09 ENCOUNTER — Encounter: Payer: Self-pay | Admitting: Cardiovascular Disease

## 2016-09-09 ENCOUNTER — Other Ambulatory Visit: Payer: Self-pay | Admitting: *Deleted

## 2016-09-09 ENCOUNTER — Ambulatory Visit (INDEPENDENT_AMBULATORY_CARE_PROVIDER_SITE_OTHER): Payer: Medicare Other | Admitting: Cardiovascular Disease

## 2016-09-09 VITALS — BP 112/72 | HR 72 | Ht 66.0 in | Wt 137.6 lb

## 2016-09-09 DIAGNOSIS — Z01818 Encounter for other preprocedural examination: Secondary | ICD-10-CM

## 2016-09-09 DIAGNOSIS — I739 Peripheral vascular disease, unspecified: Secondary | ICD-10-CM

## 2016-09-09 DIAGNOSIS — Z7901 Long term (current) use of anticoagulants: Secondary | ICD-10-CM

## 2016-09-09 NOTE — Progress Notes (Signed)
09/09/2016 Derrick [name]   05-Jan-1946  [id number]  Primary Physician Horatio Pel, MD Primary Cardiologist: Lorretta Harp MD Renae Gloss  HPI:  Derrick [name] is a very pleasant 70 year old thin and fit-appearing married Caucasian male father of 50, grandfather to 3 grandchildren who is accompanied by his wife Derrick [name]. He was referred by Dr. Shelia Media for cardiovascular evaluation because of a prior history of coronary stenting. I last saw him in the office 07/22/16. His cardiovascular risk factors include treated diabetes and hyperlipidemia intolerant to statin therapy. He smoked 45 pack years and quit at the time of his myocardial infarction in 2010. He does have a family history of heart disease with a father and brother both of whom had coronary artery bypass grafting. He'll myocardial infarction back in 2010 and had stenting performed by Dr. Olevia Perches. He does get occasional atypical chest pain every other month. There also is a question of moderate mitral regurgitation. A 2-D echocardiogram showed normal LV function with mild MR. The Myoview stress test which was read as intermediate risk with inferior and lateral ischemia. Based on this, the patient was referred for cardiac catheterization to define his anatomy. It should also be noted the patient has been having right hip pain thought to be arthritic for many years. I performed cardiac catheterization on him 03/28/16 revealing a patent proximal LAD stent, 95% ostial circumflex, 70% mid AV groove circumflex a long 70% proximal and mid dominant RCA stenosis. I thought his anatomy was most suited for coronary artery bypass grafting which was performed by Dr. Cyndia Bent on 04/22/16. The LIMA graft to his OM1, vein to OM 2 and a free RIMA to the RCA. His postoperative course was uncomplicated. He is back to work now denying chest pain or shortness of breath. His major issue now is bilateral hip and calf claudication which is lifestyle  limiting. I performed peripheral angiography on him 08/22/16 revealing high-grade left common iliac artery stenosis which I stented with a 6 mm x 26 mm long Lifestream covered stent and post with his 7 mm balloon. He did have a total left SFA with two-vessel runoff. In addition, he had right common iliac artery CTO with three-vessel runoff. His left-sided symptoms have steadily improved. Continues to have right hip and calf claudication is to proceed with revascularization.  Current Outpatient Prescriptions  Medication Sig Dispense Refill  . acetaminophen (TYLENOL) 325 MG tablet Take 2 tablets (650 mg total) by mouth every 4 (four) hours as needed for headache or mild pain.    [name] aspirin EC 81 MG EC tablet Take 1 tablet (81 mg total) by mouth daily.    . Blood Glucose Monitoring Suppl (ONE TOUCH ULTRA SYSTEM KIT) W/DEVICE KIT Patient must check sugar TID 1 each 0  . clopidogrel (PLAVIX) 75 MG tablet Take 1 tablet (75 mg total) by mouth daily with breakfast. 30 tablet 11  . glucose blood (ONE TOUCH ULTRA TEST) test strip USE ONE STRIP TO CHECK GLUCOSE 4 TIMES DAILY AS NEEDED 100 each 12  . HYDROcodone-acetaminophen (NORCO/VICODIN) 5-325 MG tablet Take 1 tablet by mouth every 6 (six) hours as needed for moderate pain. 40 tablet 0  . Insulin Human (INSULIN PUMP) SOLN Inject 1 each into the skin continuous. 2.7 small, 5.5 medium, 8.5 large meal settings  Humalog    . metoprolol tartrate (LOPRESSOR) 25 MG tablet Take 0.5 tablets (12.5 mg total) by mouth 2 (two) times daily. (Patient taking differently: Take 12.5 mg by  mouth daily. ) 90 tablet 1  . nitroGLYCERIN (NITROSTAT) 0.4 MG SL tablet Place 1 tablet (0.4 mg total) under the tongue every 5 (five) minutes as needed. (Patient taking differently: Place 0.4 mg under the tongue every 5 (five) minutes as needed for chest pain. ) 20 tablet 1  . ONETOUCH DELICA LANCETS 12W MISC USE ONE  4 TIMES DAILY AS NEEDED 100 each 0  . pantoprazole (PROTONIX) 40 MG tablet  Take 1 tablet (40 mg total) by mouth daily. 30 tablet 6  . research study medication Take 1 each by mouth daily. Study drug for cholesterol from Dr. Deland Pretty     No current facility-administered medications for this visit.     Allergies  Allergen Reactions  . Atorvastatin     Muscle cramps   . Lovastatin     Muscle cramps  . Simvastatin Other (See Comments)    Muscle pain  . Pravastatin Other (See Comments)    Muscle cramps    Social History   Social History  . Marital status: Married    Spouse name: N/A  . Number of children: N/A  . Years of education: N/A   Occupational History  . Not on file.   Social History Main Topics  . Smoking status: Former Smoker    Packs/day: 1.00    Years: 50.00    Types: Cigarettes    Quit date: 01/06/2009  . Smokeless tobacco: Never Used     Comment: Counseled to remain smoke free  . Alcohol use No  . Drug use: No  . Sexual activity: No   Other Topics Concern  . Not on file   Social History Narrative   Married, works part time at [employer] in [place], [place].   Former smoker: quit 01/2011.  No alcohol or drugs.   No formal exercise.     Review of Systems: General: negative for chills, fever, night sweats or weight changes.  Cardiovascular: negative for chest pain, dyspnea on exertion, edema, orthopnea, palpitations, paroxysmal nocturnal dyspnea or shortness of breath Dermatological: negative for rash Respiratory: negative for cough or wheezing Urologic: negative for hematuria Abdominal: negative for nausea, vomiting, diarrhea, bright red blood per rectum, melena, or hematemesis Neurologic: negative for visual changes, syncope, or dizziness All other systems reviewed and are otherwise negative except as noted above.    Blood pressure 112/72, pulse 72, height '5\' 6"'$  (1.676 m), weight 137 lb 9.6 oz (62.4 kg).  General appearance: alert and no distress Neck: no adenopathy, no carotid bruit, no JVD, supple,  symmetrical, trachea midline and thyroid not enlarged, symmetric, no tenderness/mass/nodules Lungs: clear to auscultation bilaterally Heart: regular rate and rhythm, S1, S2 normal, no murmur, click, rub or gallop Extremities: extremities normal, atraumatic, no cyanosis or edema  EKG not performed today  ASSESSMENT AND PLAN:   PVD (peripheral vascular disease) [facility]) Derrick [name] returns today for post hospital follow-up after his recent left common iliac artery stent procedure was performed 08/22/16. He did have bilateral hip and calf claudication. His angiogram revealed a high-grade ostial/proximal left common iliac artery stenosis which I stented with a 6 mm x 26 mm long Lifestream covered stent postdilated with a 7 mm balloon. He did have a total right common iliac, total left SFA with 3 vessel runoff bilaterally. His left-sided symptoms have significant improved. He continues to have right hip and calf pain with ambulation. We decided to proceed with attempt at right common iliac artery percutaneously vascular invasion for a chronic  total occlusion.      Lorretta Harp MD FACP,FACC,FAHA, [facility] 09/09/2016 4:09 PM

## 2016-09-09 NOTE — Patient Instructions (Signed)
Medication Instructions:  Your physician recommends that you continue on your current medications as directed. Please refer to the Current Medication list given to you today.  PRIOR TO THE PROCEDURE: PLEASE STOP YOUR INSULIN PUMP  At midnight the night before the procedure the next day.   Testing/Procedures: Dr. Allyson SabalBerry has ordered a peripheral angi[facility] to [facility] at [facility] .  This procedure is going to look at the bloodflow in your lower extremities.  If Dr. Allyson SabalBerry is able to open up the arteries, you will have to spend one night in the hospital.  If he is not able to open the arteries, you will be able to go home that same day.     After the procedure, you will not be allowed to drive for 3 days or push, pull, or lift anything greater than 10 lbs for one week.    You will be required to have the following tests prior to the procedure:  1. Blood work-the blood work can be done no more than 14 days prior to the procedure.  It can be done at any [facility] lab.  There is one downstairs on the first floor of this building and one in the [facility] building [id number]([address])     *REPS  NONE PER DR BERRY  Puncture site BILATERAL GROIN

## 2016-09-09 NOTE — Assessment & Plan Note (Signed)
Derrick [name] returns today for post hospital follow-up after his recent left common iliac artery stent procedure was performed 08/22/16. He did have bilateral hip and calf claudication. His angiogram revealed a high-grade ostial/proximal left common iliac artery stenosis which I stented with a 6 mm x 26 mm long Lifestream covered stent postdilated with a 7 mm balloon. He did have a total right common iliac, total left SFA with 3 vessel runoff bilaterally. His left-sided symptoms have significant improved. He continues to have right hip and calf pain with ambulation. We decided to proceed with attempt at right common iliac artery percutaneously vascular invasion for a chronic total occlusion.

## 2016-09-16 DIAGNOSIS — G894 Chronic pain syndrome: Secondary | ICD-10-CM | POA: Diagnosis not present

## 2016-09-16 DIAGNOSIS — Z79891 Long term (current) use of opiate analgesic: Secondary | ICD-10-CM | POA: Diagnosis not present

## 2016-09-16 DIAGNOSIS — Z79899 Other long term (current) drug therapy: Secondary | ICD-10-CM | POA: Diagnosis not present

## 2016-09-16 DIAGNOSIS — E1142 Type 2 diabetes mellitus with diabetic polyneuropathy: Secondary | ICD-10-CM | POA: Diagnosis not present

## 2016-09-19 DIAGNOSIS — E1140 Type 2 diabetes mellitus with diabetic neuropathy, unspecified: Secondary | ICD-10-CM | POA: Diagnosis not present

## 2016-09-23 ENCOUNTER — Encounter: Payer: Self-pay | Admitting: Cardiovascular Disease

## 2016-09-23 ENCOUNTER — Telehealth: Payer: Self-pay | Admitting: *Deleted

## 2016-09-23 DIAGNOSIS — Z01818 Encounter for other preprocedural examination: Secondary | ICD-10-CM | POA: Diagnosis not present

## 2016-09-23 DIAGNOSIS — Z7901 Long term (current) use of anticoagulants: Secondary | ICD-10-CM | POA: Diagnosis not present

## 2016-09-23 DIAGNOSIS — I739 Peripheral vascular disease, unspecified: Secondary | ICD-10-CM | POA: Diagnosis not present

## 2016-09-23 LAB — CBC WITH DIFFERENTIAL/PLATELET
BASOS PCT: 0 %
Basophils Absolute: 0 cells/uL (ref 0–200)
EOS PCT: 2 %
Eosinophils Absolute: 108 cells/uL (ref 15–500)
HCT: 36.1 % — ABNORMAL LOW (ref 38.5–50.0)
HEMOGLOBIN: 11.4 g/dL — AB (ref 13.2–17.1)
LYMPHS ABS: 1134 {cells}/uL (ref 850–3900)
LYMPHS PCT: 21 %
MCH: 26.7 pg — ABNORMAL LOW (ref 27.0–33.0)
MCHC: 31.6 g/dL — AB (ref 32.0–36.0)
MCV: 84.5 fL (ref 80.0–100.0)
MPV: 10.3 fL (ref 7.5–12.5)
Monocytes Absolute: 486 cells/uL (ref 200–950)
Monocytes Relative: 9 %
NEUTROS PCT: 68 %
Neutro Abs: 3672 cells/uL (ref 1500–7800)
Platelets: 224 10*3/uL (ref 140–400)
RBC: 4.27 MIL/uL (ref 4.20–5.80)
RDW: 16.7 % — AB (ref 11.0–15.0)
WBC: 5.4 10*3/uL (ref 3.8–10.8)

## 2016-09-23 LAB — BASIC METABOLIC PANEL
BUN: 12 mg/dL (ref 7–25)
CO2: 28 mmol/L (ref 20–31)
Calcium: 9 mg/dL (ref 8.6–10.3)
Chloride: 104 mmol/L (ref 98–110)
Creat: 1.53 mg/dL — ABNORMAL HIGH (ref 0.70–1.18)
Glucose, Bld: 164 mg/dL — ABNORMAL HIGH (ref 65–99)
POTASSIUM: 4.8 mmol/L (ref 3.5–5.3)
SODIUM: 140 mmol/L (ref 135–146)

## 2016-09-23 LAB — APTT: APTT: 25 s (ref 22–34)

## 2016-09-23 LAB — PROTIME-INR
INR: 1
PROTHROMBIN TIME: 10.8 s (ref 9.0–11.5)

## 2016-09-23 NOTE — Telephone Encounter (Signed)
Spoke with patient regarding date change for procedure from 09/29/16.--New date Thursday 10/13/16 @ 9:30 am.  Derrick ArmsArrive at 7:30 am st the short stay center.  Informed patient I will mail new instructions.  He voiced his undersanding.

## 2016-09-26 ENCOUNTER — Telehealth: Payer: Self-pay | Admitting: *Deleted

## 2016-09-26 DIAGNOSIS — Z7902 Long term (current) use of antithrombotics/antiplatelets: Secondary | ICD-10-CM

## 2016-09-26 DIAGNOSIS — Z01818 Encounter for other preprocedural examination: Secondary | ICD-10-CM

## 2016-09-26 DIAGNOSIS — I739 Peripheral vascular disease, unspecified: Secondary | ICD-10-CM

## 2016-09-26 NOTE — Telephone Encounter (Signed)
Results called to pt. Pt verbalized understanding. Due to rescheduling, patient will need to have lab work redrawn to be within the 14 day time frame for the procedure as it was rescheduled for 10/13/16. Labs reentered. Patient verbalized understanding to have lab work redone.

## 2016-09-26 NOTE — Telephone Encounter (Signed)
-----   Message from Runell GessJonathan J Berry, MD sent at 09/25/2016  4:06 PM EDT ----- LABS OK FOR PV ANGIO

## 2016-10-07 DIAGNOSIS — Z01818 Encounter for other preprocedural examination: Secondary | ICD-10-CM | POA: Diagnosis not present

## 2016-10-07 DIAGNOSIS — Z7902 Long term (current) use of antithrombotics/antiplatelets: Secondary | ICD-10-CM | POA: Diagnosis not present

## 2016-10-07 DIAGNOSIS — I739 Peripheral vascular disease, unspecified: Secondary | ICD-10-CM | POA: Diagnosis not present

## 2016-10-07 LAB — CBC WITH DIFFERENTIAL/PLATELET
Basophils Absolute: 56 cells/uL (ref 0–200)
Basophils Relative: 1 %
EOS PCT: 3 %
Eosinophils Absolute: 168 cells/uL (ref 15–500)
HCT: 37.5 % — ABNORMAL LOW (ref 38.5–50.0)
HEMOGLOBIN: 12.1 g/dL — AB (ref 13.2–17.1)
LYMPHS ABS: 1008 {cells}/uL (ref 850–3900)
Lymphocytes Relative: 18 %
MCH: 27.1 pg (ref 27.0–33.0)
MCHC: 32.3 g/dL (ref 32.0–36.0)
MCV: 84.1 fL (ref 80.0–100.0)
MONOS PCT: 7 %
MPV: 10.2 fL (ref 7.5–12.5)
Monocytes Absolute: 392 cells/uL (ref 200–950)
NEUTROS ABS: 3976 {cells}/uL (ref 1500–7800)
NEUTROS PCT: 71 %
PLATELETS: 250 10*3/uL (ref 140–400)
RBC: 4.46 MIL/uL (ref 4.20–5.80)
RDW: 16.1 % — ABNORMAL HIGH (ref 11.0–15.0)
WBC: 5.6 10*3/uL (ref 3.8–10.8)

## 2016-10-07 LAB — BASIC METABOLIC PANEL
BUN: 14 mg/dL (ref 7–25)
CO2: 27 mmol/L (ref 20–31)
Calcium: 9.3 mg/dL (ref 8.6–10.3)
Chloride: 101 mmol/L (ref 98–110)
Creat: 1.51 mg/dL — ABNORMAL HIGH (ref 0.70–1.18)
GLUCOSE: 195 mg/dL — AB (ref 65–99)
POTASSIUM: 5 mmol/L (ref 3.5–5.3)
SODIUM: 139 mmol/L (ref 135–146)

## 2016-10-07 LAB — APTT: APTT: 25 s (ref 22–34)

## 2016-10-07 LAB — PROTIME-INR
INR: 1
PROTHROMBIN TIME: 11 s (ref 9.0–11.5)

## 2016-10-13 ENCOUNTER — Ambulatory Visit (HOSPITAL_COMMUNITY)
Admission: RE | Admit: 2016-10-13 | Discharge: 2016-10-14 | Disposition: A | Discharge status: Home / Self Care | Payer: Medicare Other | Source: Ambulatory Visit | Attending: Cardiovascular Disease | Admitting: Cardiovascular Disease

## 2016-10-13 ENCOUNTER — Encounter (HOSPITAL_COMMUNITY)
Admission: RE | Disposition: A | Discharge status: Home / Self Care | Payer: Self-pay | Source: Ambulatory Visit | Attending: Cardiovascular Disease

## 2016-10-13 ENCOUNTER — Encounter (HOSPITAL_COMMUNITY): Payer: Self-pay | Admitting: General Practice

## 2016-10-13 DIAGNOSIS — I252 Old myocardial infarction: Secondary | ICD-10-CM | POA: Diagnosis not present

## 2016-10-13 DIAGNOSIS — Z8249 Family history of ischemic heart disease and other diseases of the circulatory system: Secondary | ICD-10-CM | POA: Diagnosis not present

## 2016-10-13 DIAGNOSIS — Z87891 Personal history of nicotine dependence: Secondary | ICD-10-CM | POA: Insufficient documentation

## 2016-10-13 DIAGNOSIS — Z79899 Other long term (current) drug therapy: Secondary | ICD-10-CM | POA: Diagnosis not present

## 2016-10-13 DIAGNOSIS — I708 Atherosclerosis of other arteries: Secondary | ICD-10-CM | POA: Insufficient documentation

## 2016-10-13 DIAGNOSIS — Z9641 Presence of insulin pump (external) (internal): Secondary | ICD-10-CM | POA: Diagnosis not present

## 2016-10-13 DIAGNOSIS — E119 Type 2 diabetes mellitus without complications: Secondary | ICD-10-CM | POA: Diagnosis not present

## 2016-10-13 DIAGNOSIS — Z955 Presence of coronary angioplasty implant and graft: Secondary | ICD-10-CM | POA: Insufficient documentation

## 2016-10-13 DIAGNOSIS — I70211 Atherosclerosis of native arteries of extremities with intermittent claudication, right leg: Secondary | ICD-10-CM | POA: Diagnosis not present

## 2016-10-13 DIAGNOSIS — Z7982 Long term (current) use of aspirin: Secondary | ICD-10-CM | POA: Diagnosis not present

## 2016-10-13 DIAGNOSIS — Z7902 Long term (current) use of antithrombotics/antiplatelets: Secondary | ICD-10-CM | POA: Diagnosis not present

## 2016-10-13 DIAGNOSIS — Z959 Presence of cardiac and vascular implant and graft, unspecified: Secondary | ICD-10-CM

## 2016-10-13 DIAGNOSIS — I739 Peripheral vascular disease, unspecified: Secondary | ICD-10-CM | POA: Insufficient documentation

## 2016-10-13 HISTORY — PX: PERIPHERAL VASCULAR CATHETERIZATION: SHX172C

## 2016-10-13 HISTORY — DX: Type 2 diabetes mellitus without complications: E11.9

## 2016-10-13 HISTORY — PX: LOWER EXTREMITY ANGIOGRAM: SHX5955

## 2016-10-13 LAB — GLUCOSE, CAPILLARY
GLUCOSE-CAPILLARY: 191 mg/dL — AB (ref 65–99)
GLUCOSE-CAPILLARY: 173 mg/dL — AB (ref 65–99)
Glucose-Capillary: 192 mg/dL — ABNORMAL HIGH (ref 65–99)
Glucose-Capillary: 231 mg/dL — ABNORMAL HIGH (ref 65–99)

## 2016-10-13 LAB — POCT ACTIVATED CLOTTING TIME
Activated Clotting Time: 224 seconds
Activated Clotting Time: 224 seconds
Activated Clotting Time: 142 seconds

## 2016-10-13 SURGERY — LOWER EXTREMITY ANGIOGRAPHY
Anesthesia: LOCAL | Laterality: Right

## 2016-10-13 MED ORDER — IODIXANOL 320 MG/ML IV SOLN
INTRAVENOUS | Status: DC | PRN
  Administered 2016-10-13: 100 mL via INTRA_ARTERIAL

## 2016-10-13 MED ORDER — SODIUM CHLORIDE 0.9 % WEIGHT BASED INFUSION
3.0000 mL/kg/h | INTRAVENOUS | Status: DC
  Administered 2016-10-13: 3 mL/kg/h via INTRAVENOUS

## 2016-10-13 MED ORDER — CLOPIDOGREL BISULFATE 75 MG PO TABS
75.0000 mg | ORAL_TABLET | Freq: Every day | ORAL | Status: DC
  Administered 2016-10-14: 75 mg via ORAL
  Filled 2016-10-13: qty 1

## 2016-10-13 MED ORDER — PANTOPRAZOLE SODIUM 40 MG PO TBEC
40.0000 mg | DELAYED_RELEASE_TABLET | Freq: Every day | ORAL | Status: DC
  Administered 2016-10-14: 40 mg via ORAL
  Filled 2016-10-13: qty 1

## 2016-10-13 MED ORDER — SODIUM CHLORIDE 0.9 % WEIGHT BASED INFUSION: 1.0000 mL/kg/h | INTRAVENOUS | Status: DC

## 2016-10-13 MED ORDER — ONDANSETRON HCL 4 MG/2ML IJ SOLN
4.0000 mg | Freq: Four times a day (QID) | INTRAMUSCULAR | Status: DC | PRN
  Administered 2016-10-13: 15:00:00 4 mg via INTRAVENOUS
  Filled 2016-10-13: qty 2

## 2016-10-13 MED ORDER — SODIUM CHLORIDE 0.9 % IV SOLN: INTRAVENOUS | Status: AC

## 2016-10-13 MED ORDER — HEPARIN (PORCINE) IN NACL 2-0.9 UNIT/ML-% IJ SOLN
INTRAMUSCULAR | Status: DC | PRN
  Administered 2016-10-13: 1000 mL

## 2016-10-13 MED ORDER — CLOPIDOGREL BISULFATE 75 MG PO TABS: 75.0000 mg | ORAL_TABLET | Freq: Every day | ORAL | Status: DC

## 2016-10-13 MED ORDER — INSULIN ASPART 100 UNIT/ML ~~LOC~~ SOLN
0.0000 [IU] | Freq: Three times a day (TID) | SUBCUTANEOUS | Status: DC
  Administered 2016-10-14: 07:00:00 2 [IU] via SUBCUTANEOUS

## 2016-10-13 MED ORDER — STUDY - INVESTIGATIONAL DRUG SIMPLE RECORD: 1.0000 | Freq: Every day | Status: DC

## 2016-10-13 MED ORDER — ACETAMINOPHEN 325 MG PO TABS: 650.0000 mg | ORAL_TABLET | ORAL | Status: DC | PRN

## 2016-10-13 MED ORDER — ASPIRIN 81 MG PO TBEC: 81.0000 mg | DELAYED_RELEASE_TABLET | Freq: Every day | ORAL | Status: DC

## 2016-10-13 MED ORDER — INSULIN PUMP
Freq: Three times a day (TID) | SUBCUTANEOUS | Status: DC
  Filled 2016-10-13: qty 1

## 2016-10-13 MED ORDER — MIDAZOLAM HCL 2 MG/2ML IJ SOLN
INTRAMUSCULAR | Status: AC
  Filled 2016-10-13: qty 2

## 2016-10-13 MED ORDER — HEPARIN SODIUM (PORCINE) 1000 UNIT/ML IJ SOLN
INTRAMUSCULAR | Status: DC | PRN
  Administered 2016-10-13: 6000 [IU] via INTRAVENOUS
  Administered 2016-10-13: 2000 [IU] via INTRAVENOUS

## 2016-10-13 MED ORDER — LIDOCAINE HCL (PF) 1 % IJ SOLN
INTRAMUSCULAR | Status: AC
  Filled 2016-10-13: qty 30

## 2016-10-13 MED ORDER — INSULIN PUMP
1.0000 | SUBCUTANEOUS | Status: DC
  Filled 2016-10-13: qty 1

## 2016-10-13 MED ORDER — LIDOCAINE HCL (PF) 1 % IJ SOLN
INTRAMUSCULAR | Status: DC | PRN
  Administered 2016-10-13 (×2): 22 mL

## 2016-10-13 MED ORDER — MIDAZOLAM HCL 2 MG/2ML IJ SOLN
INTRAMUSCULAR | Status: DC | PRN
  Administered 2016-10-13: 1 mg via INTRAVENOUS

## 2016-10-13 MED ORDER — SODIUM CHLORIDE 0.9% FLUSH: 3.0000 mL | INTRAVENOUS | Status: DC | PRN

## 2016-10-13 MED ORDER — FENTANYL CITRATE (PF) 100 MCG/2ML IJ SOLN
INTRAMUSCULAR | Status: AC
  Filled 2016-10-13: qty 2

## 2016-10-13 MED ORDER — ASPIRIN EC 81 MG PO TBEC
81.0000 mg | DELAYED_RELEASE_TABLET | Freq: Every day | ORAL | Status: DC
  Administered 2016-10-14: 11:00:00 81 mg via ORAL
  Filled 2016-10-13: qty 1

## 2016-10-13 MED ORDER — HYDROCODONE-ACETAMINOPHEN 5-325 MG PO TABS
1.0000 | ORAL_TABLET | Freq: Four times a day (QID) | ORAL | Status: DC | PRN
  Administered 2016-10-13: 1 via ORAL
  Filled 2016-10-13: qty 1

## 2016-10-13 MED ORDER — METOPROLOL TARTRATE 12.5 MG HALF TABLET
12.5000 mg | ORAL_TABLET | Freq: Two times a day (BID) | ORAL | Status: DC
  Administered 2016-10-14: 11:00:00 12.5 mg via ORAL
  Filled 2016-10-13 (×2): qty 1

## 2016-10-13 MED ORDER — FENTANYL CITRATE (PF) 100 MCG/2ML IJ SOLN
INTRAMUSCULAR | Status: DC | PRN
  Administered 2016-10-13: 25 ug via INTRAVENOUS

## 2016-10-13 MED ORDER — HEPARIN (PORCINE) IN NACL 2-0.9 UNIT/ML-% IJ SOLN
INTRAMUSCULAR | Status: AC
  Filled 2016-10-13: qty 1000

## 2016-10-13 MED ORDER — ASPIRIN 81 MG PO CHEW
81.0000 mg | CHEWABLE_TABLET | ORAL | Status: DC
Start: 2016-10-13 — End: 2016-10-13

## 2016-10-13 MED ORDER — INSULIN ASPART 100 UNIT/ML ~~LOC~~ SOLN
0.0000 [IU] | Freq: Every day | SUBCUTANEOUS | Status: DC
  Administered 2016-10-13: 22:00:00 3 [IU] via SUBCUTANEOUS

## 2016-10-13 MED ORDER — INSULIN ASPART 100 UNIT/ML ~~LOC~~ SOLN: 0.0000 [IU] | Freq: Three times a day (TID) | SUBCUTANEOUS | Status: DC

## 2016-10-13 SURGICAL SUPPLY — 21 items
CATH ANGIO 5F PIGTAIL 65CM (CATHETERS) ×3 IMPLANT
CATH NAVICROSS ANG 65CM (CATHETERS) ×2 IMPLANT
CATH NAVICROSS ST 65CM (CATHETERS) ×2 IMPLANT
CATHETER NAVICROSS ANG 65CM (CATHETERS) ×3
CATHETER NAVICROSS ST 65CM (CATHETERS) ×3
DEVICE TORQUE .014-.018 (MISCELLANEOUS) ×2 IMPLANT
DEVICE TORQUE .025-.038 (MISCELLANEOUS) ×3 IMPLANT
GLIDEWIRE ANGLED SS 035X260CM (WIRE) ×6 IMPLANT
KIT PV (KITS) ×3 IMPLANT
KIT SINGLE-Y CONNECTOR (CONNECTOR) ×3 IMPLANT
SHEATH BRITE TIP 7FR 35CM (SHEATH) ×3 IMPLANT
SHEATH PINNACLE 5F 10CM (SHEATH) ×6 IMPLANT
SHEATH PINNACLE 7F 10CM (SHEATH) ×3 IMPLANT
STOPCOCK MORSE 400PSI 3WAY (MISCELLANEOUS) ×3 IMPLANT
SYRINGE MEDRAD AVANTA MACH 7 (SYRINGE) ×3 IMPLANT
TORQUE DEVICE .014-.018 (MISCELLANEOUS) ×3
TRANSDUCER W/STOPCOCK (MISCELLANEOUS) ×3 IMPLANT
TRAY PV CATH (CUSTOM PROCEDURE TRAY) ×3 IMPLANT
TUBING CIL FLEX 10 FLL-RA (TUBING) ×3 IMPLANT
WIRE HI TORQ COMMND ES.014X300 (WIRE) ×3 IMPLANT
WIRE HITORQ VERSACORE ST 145CM (WIRE) ×3 IMPLANT

## 2016-10-13 NOTE — Interval H&P Note (Signed)
History and Physical Interval Note:  10/13/2016 9:30 AM  Derrick [name]  has presented today for surgery, with the diagnosis of pad  The various methods of treatment have been discussed with the patient and family. After consideration of risks, benefits and other options for treatment, the patient has consented to  Procedure(s): Lower Extremity Angiography (Bilateral) as a surgical intervention .  The patient's history has been reviewed, patient examined, no change in status, stable for surgery.  I have reviewed the patient's chart and labs.  Questions were answered to the patient's satisfaction.     Nanetta BattyBerry, Ladaja Yusupov

## 2016-10-13 NOTE — Progress Notes (Signed)
Patient's B/P remains soft this shift, SBP=80's-90's with MAP ranging between 58-63. Dr Orson AloeHenderson notified and made aware, stated to hold Lopressor. Patient remains asymptomatic and sitting in chair at this time. Call bell is in reach. No S/S of distress noted or complaints voiced at this time.

## 2016-10-13 NOTE — Progress Notes (Signed)
Site area: right groin  Site Prior to Removal:  Level 1  Pressure Applied For 20 MINUTES    Minutes Beginning at 1325  Manual:   Yes.    Patient Status During Pull:  stable  Post Pull Groin Site:  Level 0  Post Pull Instructions Given:  Yes.    Post Pull Pulses Present:  Yes.  doppler  Dressing Applied:  Yes.    Comments:  2 cm hematoma noted on admission to unit. After pressure held. No bruising or hematoma noted. Level 0, dressing dry and intact on assessment remainder of shift   Site area: left groin  Site Prior to Removal:  Level 1  Pressure Applied For 20 MINUTES    Minutes Beginning at 1350  Manual:   Yes.    Patient Status During Pull:  stable  Post Pull Groin Site:  Level 1  Post Pull Instructions Given:  Yes.    Post Pull Pulses Present:  Yes.    Dressing Applied:  Yes.    Comments:   1 cm hematoma noted on admission to unit. After pressure held. No bruising or hematoma noted. Level 0, dressing dry and intact on assessment remainder of shift

## 2016-10-13 NOTE — H&P (Signed)
_0 Hide copied text     10/13/16 Derrick [name]   [birth year]  [id number]  Primary Physician Derrick Pel, MD Primary Cardiologist: Derrick Harp MD Derrick [name]  HPI:  Derrick [name] is a very pleasant 70 year old thin and fit-appearing married Caucasian male father of 35, grandfather to 3 grandchildren who is accompanied by his wife Derrick [name]. He was referred by Derrick [name] for cardiovascular evaluation because of a prior history of coronary stenting. I last saw him in the office 07/22/16. His cardiovascular risk factors include treated diabetes and hyperlipidemia intolerant to statin therapy. He smoked 45 pack years and quit at the time of his myocardial infarction in 2010. He does have a family history of heart disease with a father and brother both of whom had coronary artery bypass grafting. He'll myocardial infarction back in 2010 and had stenting performed by Derrick [name]. He does get occasional atypical chest pain every other month. There also is a question of moderate mitral regurgitation. A 2-D echocardiogram showed normal LV function with mild MR. The Myoview stress test which was read as intermediate risk with inferior and lateral ischemia. Based on this, the patient was referred for cardiac catheterization to define his anatomy. It should also be noted the patient has been having right hip pain thought to be arthritic for many years. I performed cardiac catheterization on him 03/28/16 revealing a patent proximal LAD stent, 95% ostial circumflex, 70% mid AV groove circumflex a long 70% proximal and mid dominant RCA stenosis. I thought his anatomy was most suited for coronary artery bypass grafting which was performed by Derrick [name] on 04/22/16. The LIMA graft to his OM1, vein to OM 2 and a free RIMA to the RCA. His postoperative course was uncomplicated. He is back to work now denying chest pain or shortness of breath. His major issue now is bilateral hip and calf claudication  which is lifestyle limiting. I performed peripheral angiography on him 08/22/16 revealing high-grade left common iliac artery stenosis which I stented with a 6 mm x 26 mm long Derrick [name] covered stent and post with his 7 mm balloon. He did have a total left SFA with two-vessel runoff. In addition, he had right common iliac artery CTO with three-vessel runoff. His left-sided symptoms have steadily improved. Continues to have right hip and calf claudication is to proceed with revascularization.  Current Outpatient Prescriptions  Medication Sig Dispense Refill  . acetaminophen (TYLENOL) 325 MG tablet Take 2 tablets (650 mg total) by mouth every 4 (four) hours as needed for headache or mild pain.    [name] aspirin EC 81 MG EC tablet Take 1 tablet (81 mg total) by mouth daily.    . Blood Glucose Monitoring Suppl (ONE TOUCH ULTRA SYSTEM KIT) W/DEVICE KIT Patient must check sugar TID 1 each 0  . clopidogrel (PLAVIX) 75 MG tablet Take 1 tablet (75 mg total) by mouth daily with breakfast. 30 tablet 11  . glucose blood (ONE TOUCH ULTRA TEST) test strip USE ONE STRIP TO CHECK GLUCOSE 4 TIMES DAILY AS NEEDED 100 each 12  . HYDROcodone-acetaminophen (NORCO/VICODIN) 5-325 MG tablet Take 1 tablet by mouth every 6 (six) hours as needed for moderate pain. 40 tablet 0  . Insulin Human (INSULIN PUMP) SOLN Inject 1 each into the skin continuous. 2.7 small, 5.5 medium, 8.5 large meal settings  Humalog    . metoprolol tartrate (LOPRESSOR) 25 MG tablet Take 0.5 tablets (12.5 mg total) by mouth 2 (two) times daily. (Patient taking differently:  Take 12.5 mg by mouth daily. ) 90 tablet 1  . nitroGLYCERIN (NITROSTAT) 0.4 MG SL tablet Place 1 tablet (0.4 mg total) under the tongue every 5 (five) minutes as needed. (Patient taking differently: Place 0.4 mg under the tongue every 5 (five) minutes as needed for chest pain. ) 20 tablet 1  . ONETOUCH DELICA LANCETS 26E MISC USE ONE  4 TIMES DAILY AS NEEDED 100 each 0  . pantoprazole  (PROTONIX) 40 MG tablet Take 1 tablet (40 mg total) by mouth daily. 30 tablet 6  . research study medication Take 1 each by mouth daily. Study drug for cholesterol from Dr. Deland Pretty     No current facility-administered medications for this visit.          Allergies  Allergen Reactions  . Atorvastatin     Muscle cramps  . Lovastatin     Muscle cramps  . Simvastatin Other (See Comments)    Muscle pain  . Pravastatin Other (See Comments)    Muscle cramps    Social History        Social History  . Marital status: Married    Spouse name: N/A  . Number of children: N/A  . Years of education: N/A      Occupational History  . Not on file.         Social History Main Topics  . Smoking status: Former Smoker    Packs/day: 1.00    Years: 50.00    Types: Cigarettes    Quit date: 01/06/2009  . Smokeless tobacco: Never Used     Comment: Counseled to remain smoke free  . Alcohol use No  . Drug use: No  . Sexual activity: No       Other Topics Concern  . Not on file      Social History Narrative   Married, works part time at [employer] in [place], [place].   Former smoker: quit 01/2011.  No alcohol or drugs.   No formal exercise.     Review of Systems: General: negative for chills, fever, night sweats or weight changes.  Cardiovascular: negative for chest pain, dyspnea on exertion, edema, orthopnea, palpitations, paroxysmal nocturnal dyspnea or shortness of breath Dermatological: negative for rash Respiratory: negative for cough or wheezing Urologic: negative for hematuria Abdominal: negative for nausea, vomiting, diarrhea, bright red blood per rectum, melena, or hematemesis Neurologic: negative for visual changes, syncope, or dizziness All other systems reviewed and are otherwise negative except as noted above.    Blood pressure 112/72, pulse 72, height _0  (1.676 m), weight 137 lb 9.6 oz (62.4 kg).  General  appearance: alert and no distress Neck: no adenopathy, no carotid bruit, no JVD, supple, symmetrical, trachea midline and thyroid not enlarged, symmetric, no tenderness/mass/nodules Lungs: clear to auscultation bilaterally Heart: regular rate and rhythm, S1, S2 normal, no murmur, click, rub or gallop Extremities: extremities normal, atraumatic, no cyanosis or edema  EKG not performed today  ASSESSMENT AND PLAN:   PVD (peripheral vascular disease) [facility]) Mr. Anselmi returns today for post hospital follow-up after his recent left common iliac artery stent procedure was performed 08/22/16. He did have bilateral hip and calf claudication. His angiogram revealed a high-grade ostial/proximal left common iliac artery stenosis which I stented with a 6 mm x 26 mm long Derrick [name] covered stent postdilated with a 7 mm balloon. He did have a total right common iliac, total left SFA with 3 vessel runoff bilaterally. His left-sided symptoms have significant  improved. He continues to have right hip and calf pain with ambulation. We decided to proceed with attempt at right common iliac artery PTA and stenting for right common iliac chronic total occlusion and lifestyle limiting claudication.     Derrick [name], M.D., [place], [facility], [place] [address] [place], [place]  [address]  [phone] 10/13/2016 8:57 AM

## 2016-10-14 ENCOUNTER — Telehealth: Payer: Self-pay | Admitting: Cardiovascular Disease

## 2016-10-14 DIAGNOSIS — Z959 Presence of cardiac and vascular implant and graft, unspecified: Secondary | ICD-10-CM

## 2016-10-14 DIAGNOSIS — I739 Peripheral vascular disease, unspecified: Secondary | ICD-10-CM

## 2016-10-14 DIAGNOSIS — I708 Atherosclerosis of other arteries: Secondary | ICD-10-CM | POA: Diagnosis not present

## 2016-10-14 DIAGNOSIS — Z955 Presence of coronary angioplasty implant and graft: Secondary | ICD-10-CM | POA: Diagnosis not present

## 2016-10-14 DIAGNOSIS — Z9641 Presence of insulin pump (external) (internal): Secondary | ICD-10-CM | POA: Diagnosis not present

## 2016-10-14 DIAGNOSIS — Z7982 Long term (current) use of aspirin: Secondary | ICD-10-CM | POA: Diagnosis not present

## 2016-10-14 DIAGNOSIS — Z8249 Family history of ischemic heart disease and other diseases of the circulatory system: Secondary | ICD-10-CM | POA: Diagnosis not present

## 2016-10-14 DIAGNOSIS — E119 Type 2 diabetes mellitus without complications: Secondary | ICD-10-CM | POA: Diagnosis not present

## 2016-10-14 DIAGNOSIS — Z87891 Personal history of nicotine dependence: Secondary | ICD-10-CM | POA: Diagnosis not present

## 2016-10-14 DIAGNOSIS — Z7902 Long term (current) use of antithrombotics/antiplatelets: Secondary | ICD-10-CM | POA: Diagnosis not present

## 2016-10-14 DIAGNOSIS — Z79899 Other long term (current) drug therapy: Secondary | ICD-10-CM | POA: Diagnosis not present

## 2016-10-14 DIAGNOSIS — I252 Old myocardial infarction: Secondary | ICD-10-CM | POA: Diagnosis not present

## 2016-10-14 LAB — BASIC METABOLIC PANEL
ANION GAP: 11 (ref 5–15)
BUN: 22 mg/dL — AB (ref 6–20)
CO2: 20 mmol/L — AB (ref 22–32)
Calcium: 8.9 mg/dL (ref 8.9–10.3)
Chloride: 107 mmol/L (ref 101–111)
Creatinine, Ser: 1.61 mg/dL — ABNORMAL HIGH (ref 0.61–1.24)
GFR calc Af Amer: 48 mL/min — ABNORMAL LOW (ref >60)
GFR calc non Af Amer: 42 mL/min — ABNORMAL LOW (ref >60)
GLUCOSE: 167 mg/dL — AB (ref 65–99)
POTASSIUM: 4.5 mmol/L (ref 3.5–5.1)
Sodium: 138 mmol/L (ref 135–145)

## 2016-10-14 LAB — CBC
HEMATOCRIT: 33.4 % — AB (ref 39.0–52.0)
HEMOGLOBIN: 11.1 g/dL — AB (ref 13.0–17.0)
MCH: 27.7 pg (ref 26.0–34.0)
MCHC: 33.2 g/dL (ref 30.0–36.0)
MCV: 83.3 fL (ref 78.0–100.0)
Platelets: 280 10*3/uL (ref 150–400)
RBC: 4.01 MIL/uL — ABNORMAL LOW (ref 4.22–5.81)
RDW: 15.9 % — AB (ref 11.5–15.5)
WBC: 6.3 10*3/uL (ref 4.0–10.5)

## 2016-10-14 LAB — GLUCOSE, CAPILLARY: Glucose-Capillary: 172 mg/dL — ABNORMAL HIGH (ref 65–99)

## 2016-10-14 NOTE — Discharge Summary (Signed)
Discharge Summary    Patient ID: Derrick [name],  MRN: [id number], DOB/AGE: Aug 22, 1946 70 y.o.  Admit date: 10/13/2016 Discharge date: 10/14/2016  Primary Care Provider: Deland Pretty DAVIDSON Primary Cardiologist: Dr. Gwenlyn Found  Discharge Diagnoses    Active Problems:   Claudication [facility]   S/P arterial stent, 08/22/16 Lt [facility] PTA/Stent    Allergies Allergies  Allergen Reactions  . Atorvastatin     Muscle cramps   . Lovastatin     Muscle cramps  . Simvastatin Other (See Comments)    Muscle pain  . Pravastatin Other (See Comments)    Muscle cramps    Diagnostic Studies/Procedures  PV Angiogram/Intervention  Final Impression: Unsuccessful attempt at right common iliac artery chronic total occlusion PTA. The sheath will be removed once the ACT falls below 170 pressure held. He'll be hydrated overnight and discharged home in the morning. I'll see him back in the office in 2-3 weeks. He would be a candidate for a left to right femorofemoral crossover graft _____________   History of Present Illness   Derrick [name] is a 71 year old male with a past medical history of HTN, HLD, CAD s/p CABG in May of this year. He was referred to Dr. Gwenlyn Found by Dr. Shelia Media for bilateral hip and calf claudication. He had left common iliac stenting in September 2017 which was successful    Hospital Course     PV report above. The right common iliac artery has a chronic total occlusion not amendable to PCI. He will be a great candidate for left to right femorofemoral cross over graft.   He will been seen by Dr. Gwenlyn Found in the office in 2-3 weeks. His right and left groins are stable without hematoma.   He was seen today by Dr. Acie Fredrickson and deemed suitable for discharge.  _____________  Discharge Vitals Blood pressure (!) 102/51, pulse 73, temperature 97.7 F (36.5 C), temperature source Oral, resp. rate (!) 21, height '5\' 6"'$  (1.676 m), weight 138 lb 10.7 oz (62.9 kg), SpO2 94 %.  Filed Weights   10/13/16 0742 10/14/16 0413  Weight: 136 lb (61.7 kg) 138 lb 10.7 oz (62.9 kg)   HEENT:   Normal, no JVD, no thyromegaly Lungs:  Clear Cor:   RR Abd:  Soft, + BS Ext:   No hematomas bilaterally  Neuro: intact   Labs & Radiologic Studies     CBC  Recent Labs  10/14/16 0356  WBC 6.3  HGB 11.1*  HCT 33.4*  MCV 83.3  PLT 681   Basic Metabolic Panel  Recent Labs  10/14/16 0356  NA 138  K 4.5  CL 107  CO2 20*  GLUCOSE 167*  BUN 22*  CREATININE 1.61*  CALCIUM 8.9     Disposition   Pt is being discharged home today in good condition.  Follow-up Plans & Appointments    Follow-up Information    Quay Burow, MD Follow up on 11/08/2016.   Specialties:  Cardiology, Radiology Why:  at 9:15 am for follow up.  Contact information: [address] [place] [address] [phone]          Discharge Instructions    Diet - low sodium heart healthy    Complete by:  As directed    Discharge instructions    Complete by:  As directed    Groin Site Care Refer to this sheet in the next few weeks. These instructions provide you with information on caring for yourself after your  procedure. Your caregiver may also give you more specific instructions. Your treatment has been planned according to current medical practices, but problems sometimes occur. Call your caregiver if you have any problems or questions after your procedure. HOME CARE INSTRUCTIONS You may shower 24 hours after the procedure. Remove the bandage (dressing) and gently wash the site with plain soap and water. Gently pat the site dry.  Do not apply powder or lotion to the site.  Do not sit in a bathtub, swimming pool, or whirlpool for 5 to 7 days.  No bending, squatting, or lifting anything over 10 pounds (4.5 kg) as directed by your caregiver.  Inspect the site at least twice daily.  Do not drive home if you are discharged the same day of the procedure. Have someone else drive you.    You may drive 24 hours after the procedure unless otherwise instructed by your caregiver.  What to expect: Any bruising will usually fade within 1 to 2 weeks.  Blood that collects in the tissue (hematoma) may be painful to the touch. It should usually decrease in size and tenderness within 1 to 2 weeks.  SEEK IMMEDIATE MEDICAL CARE IF: You have unusual pain at the groin site or down the affected leg.  You have redness, warmth, swelling, or pain at the groin site.  You have drainage (other than a small amount of blood on the dressing).  You have chills.  You have a fever or persistent symptoms for more than 72 hours.  You have a fever and your symptoms suddenly get worse.  Your leg becomes pale, cool, tingly, or numb.  You have heavy bleeding from the site. Hold pressure on the site. .   Increase activity slowly    Complete by:  As directed       Discharge Medications   Current Discharge Medication List    CONTINUE these medications which have NOT CHANGED   Details  aspirin EC 81 MG EC tablet Take 1 tablet (81 mg total) by mouth daily.    clopidogrel (PLAVIX) 75 MG tablet Take 1 tablet (75 mg total) by mouth daily with breakfast. Qty: 30 tablet, Refills: 11    HYDROcodone-acetaminophen (NORCO/VICODIN) 5-325 MG tablet Take 1 tablet by mouth every 6 (six) hours as needed for moderate pain. Qty: 40 tablet, Refills: 0   Associated Diagnoses: Post-op pain    Insulin Human (INSULIN PUMP) SOLN Inject 1 each into the skin continuous. 2.7 small, 5.5 medium, 8.5 large meal settings  Humalog    metoprolol tartrate (LOPRESSOR) 25 MG tablet Take 0.5 tablets (12.5 mg total) by mouth 2 (two) times daily. Qty: 90 tablet, Refills: 1    pantoprazole (PROTONIX) 40 MG tablet Take 1 tablet (40 mg total) by mouth daily. Qty: 30 tablet, Refills: 6    research study medication Take 1 each by mouth daily. Study drug for cholesterol from Dr. Merri Brunette    Blood Glucose Monitoring Suppl (ONE  TOUCH ULTRA SYSTEM KIT) W/DEVICE KIT Patient must check sugar TID Qty: 1 each, Refills: 0    glucose blood (ONE TOUCH ULTRA TEST) test strip USE ONE STRIP TO CHECK GLUCOSE 4 TIMES DAILY AS NEEDED Qty: 100 each, Refills: 12    nitroGLYCERIN (NITROSTAT) 0.4 MG SL tablet Place 1 tablet (0.4 mg total) under the tongue every 5 (five) minutes as needed. Qty: 20 tablet, Refills: 1   Associated Diagnoses: Type II or unspecified type diabetes mellitus without mention of complication, uncontrolled; Hyperlipidemia    ONETOUCH  DELICA LANCETS 46K MISC USE ONE  4 TIMES DAILY AS NEEDED Qty: 100 each, Refills: 0      STOP taking these medications     acetaminophen (TYLENOL) 325 MG tablet             Outstanding Labs/Studies     Duration of Discharge Encounter   Greater than 30 minutes including physician time.  Signed, Arbutus Leas NP 10/14/2016, 9:31 AM   Attending Note:   The patient was seen and examined.  Agree with assessment and plan as noted above.  Changes made to the above note as needed.  Patient seen and independently examined with Jettie Booze, NP .   We discussed all aspects of the encounter. I agree with the assessment and plan as stated above.  Pt has done well after attempt at vascular procedure He will need to see [facility] and have grafting  Will follow up with Dr. Gwenlyn Found to arrange    I have spent a total of 40 minutes with patient reviewing hospital  notes , telemetry, EKGs, labs and examining patient as well as establishing an assessment and plan that was discussed with the patient. > 50% of time was spent in direct patient care.    Thayer Headings, Brooke Bonito., MD, [facility] 10/14/2016, 9:38 AM [address]  [place] Pager [phone]

## 2016-10-14 NOTE — Telephone Encounter (Signed)
Fax received asking for additional coding for labs. Found appropriate code. Used Code Z79.02--long-term use of Antiplatelets.  Document [facility] to [facility].

## 2016-10-20 DIAGNOSIS — G894 Chronic pain syndrome: Secondary | ICD-10-CM | POA: Diagnosis not present

## 2016-10-20 DIAGNOSIS — E1142 Type 2 diabetes mellitus with diabetic polyneuropathy: Secondary | ICD-10-CM | POA: Diagnosis not present

## 2016-10-20 DIAGNOSIS — Z79899 Other long term (current) drug therapy: Secondary | ICD-10-CM | POA: Diagnosis not present

## 2016-10-20 DIAGNOSIS — Z79891 Long term (current) use of opiate analgesic: Secondary | ICD-10-CM | POA: Diagnosis not present

## 2016-10-20 DIAGNOSIS — E1140 Type 2 diabetes mellitus with diabetic neuropathy, unspecified: Secondary | ICD-10-CM | POA: Diagnosis not present

## 2016-11-08 ENCOUNTER — Ambulatory Visit (INDEPENDENT_AMBULATORY_CARE_PROVIDER_SITE_OTHER): Payer: Medicare Other | Admitting: Cardiovascular Disease

## 2016-11-08 ENCOUNTER — Encounter: Payer: Self-pay | Admitting: Cardiovascular Disease

## 2016-11-08 VITALS — BP 128/72 | HR 70 | Ht 66.0 in | Wt 134.0 lb

## 2016-11-08 DIAGNOSIS — Z09 Encounter for follow-up examination after completed treatment for conditions other than malignant neoplasm: Secondary | ICD-10-CM | POA: Diagnosis not present

## 2016-11-08 DIAGNOSIS — I779 Disorder of arteries and arterioles, unspecified: Secondary | ICD-10-CM

## 2016-11-08 DIAGNOSIS — Z959 Presence of cardiac and vascular implant and graft, unspecified: Secondary | ICD-10-CM | POA: Diagnosis not present

## 2016-11-08 DIAGNOSIS — I739 Peripheral vascular disease, unspecified: Secondary | ICD-10-CM

## 2016-11-08 NOTE — Progress Notes (Signed)
11/08/2016 CHIBUIKE FLEEK   10/28/1946  [id number]  Primary Physician Horatio Pel, MD Primary Cardiologist: Lorretta Harp MD Renae Gloss  HPI:  Mr. Hoos is a very pleasant 70 year old thin and fit-appearing married Caucasian male father of 13, grandfather to 3 grandchildren who I last saw in the office 09/09/16.[name] He was referred by Dr. Shelia Media for cardiovascular evaluation because of a prior history of coronary stenting. Factors include treated diabetes and hyperlipidemia intolerant to statin therapy. He smoked 45 pack years and quit at the time of his myocardial infarction in 2010. He does have a family history of heart disease with a father and brother both of whom had coronary artery bypass grafting. He'll myocardial infarction back in 2010 and had stenting performed by Dr. Olevia Perches. He does get occasional atypical chest pain every other month. There also is a question of moderate mitral regurgitation. A 2-D echocardiogram showed normal LV function with mild MR. The Myoview stress test which was read as intermediate risk with inferior and lateral ischemia. Based on this, the patient was referred for cardiac catheterization to define his anatomy. It should also be noted the patient has been having right hip pain thought to be arthritic for many years. I performed cardiac catheterization on him 03/28/16 revealing a patent proximal LAD stent, 95% ostial circumflex, 70% mid AV groove circumflex a long 70% proximal and mid dominant RCA stenosis. I thought his anatomy was most suited for coronary artery bypass grafting which was performed by Dr. Cyndia Bent on 04/22/16. The LIMA graft to his OM1, vein to OM 2 and a free RIMA to the RCA. His postoperative course was uncomplicated. He is back to work now denying chest pain or shortness of breath. His major issue now is bilateral hip and calf claudication which is lifestyle limiting. He presents today for angiography and potential right  common iliac artery PTA and stenting for chronic total occlusion. He had left common iliac artery stenting 08/22/16 successfully improving his left lower extremity . I attempted to percutaneously recanalized his right common iliac chronic total occlusion of 10/13/16 unsuccessfully. He continues to have lifestyle limiting right hip claudication. We talked about options and the patient desires to proceed with surgical revascularization. I'm referring him to Dr. Trula Slade for consideration of left to right femorofemoral crossover grafting.   Current Outpatient Prescriptions  Medication Sig Dispense Refill  . aspirin EC 81 MG EC tablet Take 1 tablet (81 mg total) by mouth daily.    . Blood Glucose Monitoring Suppl (ONE TOUCH ULTRA SYSTEM KIT) W/DEVICE KIT Patient must check sugar TID 1 each 0  . clopidogrel (PLAVIX) 75 MG tablet Take 1 tablet (75 mg total) by mouth daily with breakfast. 30 tablet 11  . glucose blood (ONE TOUCH ULTRA TEST) test strip USE ONE STRIP TO CHECK GLUCOSE 4 TIMES DAILY AS NEEDED 100 each 12  . HYDROcodone-acetaminophen (NORCO/VICODIN) 5-325 MG tablet Take 1 tablet by mouth every 6 (six) hours as needed for moderate pain. (Patient taking differently: Take 1 tablet by mouth every evening. ) 40 tablet 0  . Insulin Human (INSULIN PUMP) SOLN Inject 1 each into the skin continuous. 2.7 small, 5.5 medium, 8.5 large meal settings  Humalog    . metoprolol tartrate (LOPRESSOR) 25 MG tablet Take 0.5 tablets (12.5 mg total) by mouth 2 (two) times daily. (Patient taking differently: Take 12.5 mg by mouth daily. ) 90 tablet 1  . nitroGLYCERIN (NITROSTAT) 0.4 MG SL tablet Place 1 tablet (  0.4 mg total) under the tongue every 5 (five) minutes as needed. (Patient taking differently: Place 0.4 mg under the tongue every 5 (five) minutes as needed for chest pain. ) 20 tablet 1  . ONETOUCH DELICA LANCETS 78M MISC USE ONE  4 TIMES DAILY AS NEEDED 100 each 0  . pantoprazole (PROTONIX) 40 MG tablet Take 1  tablet (40 mg total) by mouth daily. 30 tablet 6  . research study medication Take 1 each by mouth daily. Study drug for cholesterol from Dr. Deland Pretty     No current facility-administered medications for this visit.     Allergies  Allergen Reactions  . Atorvastatin     Muscle cramps   . Lovastatin     Muscle cramps  . Simvastatin Other (See Comments)    Muscle pain  . Pravastatin Other (See Comments)    Muscle cramps    Social History   Social History  . Marital status: Married    Spouse name: N/A  . Number of children: N/A  . Years of education: N/A   Occupational History  . Not on file.   Social History Main Topics  . Smoking status: Former Smoker    Packs/day: 1.00    Years: 50.00    Types: Cigarettes    Quit date: 01/06/2009  . Smokeless tobacco: Never Used  . Alcohol use No     Comment: "drank some in high school"  . Drug use: No  . Sexual activity: No   Other Topics Concern  . Not on file   Social History Narrative   Married, works part time at [employer] in [place], [place].   Former smoker: quit 01/2011.  No alcohol or drugs.   No formal exercise.     Review of Systems: General: negative for chills, fever, night sweats or weight changes.  Cardiovascular: negative for chest pain, dyspnea on exertion, edema, orthopnea, palpitations, paroxysmal nocturnal dyspnea or shortness of breath Dermatological: negative for rash Respiratory: negative for cough or wheezing Urologic: negative for hematuria Abdominal: negative for nausea, vomiting, diarrhea, bright red blood per rectum, melena, or hematemesis Neurologic: negative for visual changes, syncope, or dizziness All other systems reviewed and are otherwise negative except as noted above.    Blood pressure 128/72, pulse 70, height _0  (1.676 m), weight 134 lb (60.8 kg).  General appearance: alert and no distress Neck: no adenopathy, no JVD, supple, symmetrical, trachea midline, thyroid not  enlarged, symmetric, no tenderness/mass/nodules and Right carotid bruit Lungs: no adenopathy, no JVD, supple, symmetrical, trachea midline, thyroid not enlarged, symmetric, no tenderness/mass/nodules and Right carotid bruit Heart: regular rate and rhythm, S1, S2 normal, no murmur, click, rub or gallop Extremities: extremities normal, atraumatic, no cyanosis or edema  EKG normal sinus rhythm at 70 without ST or T-wave changes. I personally reviewed this EKG  ASSESSMENT AND PLAN:   S/P arterial stent, 08/22/16 Lt [facility] PTA/Stent  I stented Mr. Weckerly left common iliac 08/12/16 with an excellent result. He returned for a planned staged right common iliac artery intervention for a chronic total occlusion. I accessed both the right and left common femoral arteries. I was unable to enter the true lumen of the aorta and recanalized his right common iliac artery. He continues to have lifestyle limiting right hip claudication. We discussed options and he prefers to proceed with left to right femorofemoral crossover grafting. He recently underwent coronary artery bypass grafting 04/22/16 and therefore should be acceptable surgical candidate from a heart point  of view. I'm referring him to Dr. Trula Slade for surgical consultation.  Right-sided carotid artery disease ([place]) Carotid Dopplers performed 5/17 done pre-bypass showed moderate right ICA stenosis. We will repeat this in January of this year.      Lorretta Harp MD FACP,FACC,FAHA, [facility] 11/08/2016 9:16 AM

## 2016-11-08 NOTE — Assessment & Plan Note (Signed)
I stented [name]'s left common iliac 08/12/16 with an excellent result. He returned for a planned staged right common iliac artery intervention for a chronic total occlusion. I accessed both the right and left common femoral arteries. I was unable to enter the true lumen of the aorta and recanalized his right common iliac artery. He continues to have lifestyle limiting right hip claudication. We discussed options and he prefers to proceed with left to right femorofemoral crossover grafting. He recently underwent coronary artery bypass grafting 04/22/16 and therefore should be acceptable surgical candidate from a heart point of view. I'm referring him to Dr. Myra GianottiBrabham for surgical consultation.

## 2016-11-08 NOTE — Patient Instructions (Signed)
Medication Instructions: Your physician recommends that you continue on your current medications as directed. Please refer to the Current Medication list given to you today.   Follow-Up: Your physician recommends that you schedule a follow-up appointment in: 4 months with Dr. Allyson SabalBerry.  You have been referred to Dr. Coral ElseVance Brabham for Left to Right Fem-Fem Crossover Grafting.   If you need a refill on your cardiac medications before your next appointment, please call your pharmacy.

## 2016-11-08 NOTE — Assessment & Plan Note (Signed)
Carotid Dopplers performed 5/17 done pre-bypass showed moderate right ICA stenosis. We will repeat this in January of this year.

## 2016-12-02 DIAGNOSIS — M79676 Pain in unspecified toe(s): Secondary | ICD-10-CM | POA: Diagnosis not present

## 2016-12-02 DIAGNOSIS — Z79899 Other long term (current) drug therapy: Secondary | ICD-10-CM | POA: Diagnosis not present

## 2016-12-02 DIAGNOSIS — G894 Chronic pain syndrome: Secondary | ICD-10-CM | POA: Diagnosis not present

## 2016-12-02 DIAGNOSIS — Z79891 Long term (current) use of opiate analgesic: Secondary | ICD-10-CM | POA: Diagnosis not present

## 2016-12-07 ENCOUNTER — Encounter (HOSPITAL_COMMUNITY): Payer: Self-pay | Admitting: *Deleted

## 2016-12-09 ENCOUNTER — Ambulatory Visit (HOSPITAL_COMMUNITY)
Admission: RE | Admit: 2016-12-09 | Discharge: 2016-12-09 | Disposition: A | Discharge status: Home / Self Care | Payer: Medicare Other | Source: Ambulatory Visit | Attending: Cardiovascular Disease | Admitting: Cardiovascular Disease

## 2016-12-09 DIAGNOSIS — I779 Disorder of arteries and arterioles, unspecified: Secondary | ICD-10-CM | POA: Diagnosis not present

## 2016-12-09 DIAGNOSIS — I739 Peripheral vascular disease, unspecified: Secondary | ICD-10-CM

## 2016-12-09 DIAGNOSIS — I6523 Occlusion and stenosis of bilateral carotid arteries: Secondary | ICD-10-CM | POA: Insufficient documentation

## 2016-12-13 ENCOUNTER — Other Ambulatory Visit: Payer: Self-pay | Admitting: Cardiovascular Disease

## 2016-12-13 DIAGNOSIS — I779 Disorder of arteries and arterioles, unspecified: Secondary | ICD-10-CM

## 2016-12-13 DIAGNOSIS — I739 Peripheral vascular disease, unspecified: Principal | ICD-10-CM

## 2016-12-16 ENCOUNTER — Other Ambulatory Visit: Payer: Self-pay

## 2016-12-16 ENCOUNTER — Encounter: Payer: Self-pay | Admitting: Surgery

## 2016-12-16 ENCOUNTER — Ambulatory Visit (INDEPENDENT_AMBULATORY_CARE_PROVIDER_SITE_OTHER): Payer: Medicare Other | Admitting: Surgery

## 2016-12-16 VITALS — BP 118/73 | HR 82 | Temp 98.0°F | Resp 16 | Ht 66.0 in | Wt 145.0 lb

## 2016-12-16 DIAGNOSIS — I70211 Atherosclerosis of native arteries of extremities with intermittent claudication, right leg: Secondary | ICD-10-CM

## 2016-12-16 NOTE — Progress Notes (Signed)
 [facility]ist of [place]  Patient name: Derrick [name] MRN: [id number] DOB: 12/18/1945 Sex: male   REFERRING PROVIDER:    Dr. Berry   REASON FOR CONSULT:    Right hip pain  HISTORY OF PRESENT ILLNESS:   Derrick [name] is a 71 y.o. male, who is Referred today for evaluation of right hip pain.  He states that he has pain in his hips with activity and this improves with rest.  He does not have to walk a long distance before he has problems.  Occasionally he will have pain at rest.  He recently underwent angiography which revealed severe disease within the left iliac system and an occluded right iliac system.  The left side was able to be stented, however the chronic occlusion on the right could not be crossed.  He is referred today for surgical revascularization  The patient suffers from chronic renal insufficiency, stage III.  He has a history of COPD.  He is a former smoker.  He had a heart attack in 2010 and in May 2017 underwent coronary artery bypass grafting.  The patient is a diabetic.  His most recent hemoglobin A1c was 7.4 in May 2017.  He suffers from hypercholesterolemia  Past Medical History:  Diagnosis Date  . Anginal pain (HCC)    occ   . Chronic renal insufficiency, stage III (moderate) 2013   CrCl est high 50s (borderline stage II/III pt denies  . COPD (chronic obstructive pulmonary disease) (HCC)    pt denies on 10/13/2016  . Coronary atherosclerosis of native coronary vessel    S/P NSTEMI 04/11/2009--DEL stent to LAD.  Nuclear stress test 11/2010 NEG, EF normal  . Diabetic peripheral neuropathy (HCC)    Painful; 1 vicodin bid very helpful (pt resistant to alternative tx's)  . Early cataracts, bilateral 04/24/2012   [name] in [place], [place]  . GERD (gastroesophageal reflux disease)   . Heart murmur   . Herpes zoster    Two separate outbreaks--one on left axillary/back region, one on right axillary/back region   . Hyperlipidemia   . Hypertension   . Mitral regurgitation 08/2013   Moderate (echo)  . Myocardial infarction 2010  . Peripheral arterial disease (HCC)   . S/P arterial stent, 08/22/16 Lt CIA PTA/Stent  08/23/2016  . Type II diabetes mellitus (HCC)      FAMILY HISTORY   Family History  Problem Relation Age of Onset  . Cancer Mother     brain  . Diabetes Father   . Heart disease Father   . Diabetes Brother   . Heart disease Brother   . Diabetes Son   . Hyperlipidemia Son   . Coronary artery disease      family hx of    SOCIAL HISTORY:   Social History   Social History  . Marital status: Married    Spouse name: N/A  . Number of children: N/A  . Years of education: N/A   Occupational History  . Not on file.   Social History Main Topics  . Smoking status: Former Smoker    Packs/day: 1.00    Years: 50.00    Types: Cigarettes    Quit date: 01/06/2009  . Smokeless tobacco: Never Used  . Alcohol use No     Comment: "drank some in high school"  . Drug use: No  . Sexual activity: No   Other Topics Concern  . Not on file   Social History Narrative     Married, works part time at [employer] in [place], [place].   Former smoker: quit 01/2011.  No alcohol or drugs.   No formal exercise.    Allergies  Allergen Reactions  . Atorvastatin     Muscle cramps   . Lovastatin     Muscle cramps  . Simvastatin Other (See Comments)    Muscle pain  . Pravastatin Other (See Comments)    Muscle cramps    Current Outpatient Prescriptions  Medication Sig Dispense Refill  . aspirin EC 81 MG EC tablet Take 1 tablet (81 mg total) by mouth daily.    . Blood Glucose Monitoring Suppl (ONE TOUCH ULTRA SYSTEM KIT) W/DEVICE KIT Patient must check sugar TID 1 each 0  . clopidogrel (PLAVIX) 75 MG tablet Take 1 tablet (75 mg total) by mouth daily with breakfast. 30 tablet 11  . glucose blood (ONE TOUCH ULTRA TEST) test strip USE ONE STRIP TO CHECK GLUCOSE 4 TIMES DAILY AS NEEDED  100 each 12  . HYDROcodone-acetaminophen (NORCO/VICODIN) 5-325 MG tablet Take 1 tablet by mouth every 6 (six) hours as needed for moderate pain. (Patient taking differently: Take 1 tablet by mouth every evening. ) 40 tablet 0  . Insulin Human (INSULIN PUMP) SOLN Inject 1 each into the skin continuous. 2.7 small, 5.5 medium, 8.5 large meal settings  Humalog    . metoprolol tartrate (LOPRESSOR) 25 MG tablet Take 0.5 tablets (12.5 mg total) by mouth 2 (two) times daily. (Patient taking differently: Take 12.5 mg by mouth daily. ) 90 tablet 1  . nitroGLYCERIN (NITROSTAT) 0.4 MG SL tablet Place 1 tablet (0.4 mg total) under the tongue every 5 (five) minutes as needed. (Patient taking differently: Place 0.4 mg under the tongue every 5 (five) minutes as needed for chest pain. ) 20 tablet 1  . ONETOUCH DELICA LANCETS 33G MISC USE ONE  4 TIMES DAILY AS NEEDED 100 each 0  . pantoprazole (PROTONIX) 40 MG tablet Take 1 tablet (40 mg total) by mouth daily. 30 tablet 6  . research study medication Take 1 each by mouth daily. Study drug for cholesterol from Dr. Walter Pharr     No current facility-administered medications for this visit.     REVIEW OF SYSTEMS:   [X] denotes positive finding, [ ] denotes negative finding Cardiac  Comments:  Chest pain or chest pressure:    Shortness of breath upon exertion:    Short of breath when lying flat:    Irregular heart rhythm:        Vascular    Pain in calf, thigh, or hip brought on by ambulation: x   Pain in feet at night that wakes you up from your sleep:     Blood clot in your veins:    Leg swelling:         Pulmonary    Oxygen at home:    Productive cough:     Wheezing:         Neurologic    Sudden weakness in arms or legs:     Sudden numbness in arms or legs:     Sudden onset of difficulty speaking or slurred speech:    Temporary loss of vision in one eye:     Problems with dizziness:         Gastrointestinal    Blood in stool:      Vomited  blood:         Genitourinary    Burning when urinating:       Blood in urine:        Psychiatric    Major depression:         Hematologic    Bleeding problems:    Problems with blood clotting too easily:        Skin    Rashes or ulcers:        Constitutional    Fever or chills:     PHYSICAL EXAM:   Vitals:   12/16/16 1256  BP: 118/73  Pulse: 82  Resp: 16  Temp: 98 F (36.7 C)  TempSrc: Oral  SpO2: 98%  Weight: 145 lb (65.8 kg)  Height: 5' 6" (1.676 m)    GENERAL: The patient is a well-nourished male, in no acute distress. The vital signs are documented above. CARDIAC: There is a regular rate and rhythm.  VASCULAR: Palpable left femoral pulse PULMONARY: Nonlabored respirations ABDOMEN: Soft and non-tender with normal pitched bowel sounds.  MUSCULOSKELETAL: There are no major deformities or cyanosis. NEUROLOGIC: No focal weakness or paresthesias are detected. SKIN: There are no ulcers or rashes noted. PSYCHIATRIC: The patient has a normal affect.  STUDIES:   Arteriogram: I have reviewed the images.  The left iliac system is widely patent with a proximal stent.  The right iliac system is occluded.  The patient also has an occluded left superficial femoral artery Carotid Doppler studies: The patient has 60-79% right sided carotid stenosis  ASSESSMENT and PLAN   Right hip claudication: I reviewed the arteriogram with the patient.  I discussed our options including aortobifemoral bypass graft versus left to right femoral-femoral bypass graft.  Because of his comorbidities, we have elected to proceed with a left-to-right femoral-femoral bypass graft.  I discussed the risks and benefits of the operation including bypass graft failure, infection, and cardioplegic complications.  All of his questions were answered.  He will need to be off of his Plavix for 5 days.  His operations been scheduled for Friday, January 26   Wells Eola Waldrep, MD [facility] Specialists of  [place] Tel [phone] Pager [phone] 

## 2016-12-26 NOTE — Pre-Procedure Instructions (Signed)
Derrick [name] Derrick [name]  12/26/2016      [facility] [address] - [place], [place] - [address] [place] [address] Phone: [phone] Fax: [phone]  [facility] - [place] - [address] [place] [address] Phone: [phone] Fax: [phone]    Your procedure is scheduled on January 26  [facility] at 1030 A.M.  Call this number if you have problems the morning of surgery:  [phone]   Remember:  Do not eat food or drink liquids after midnight.   Take these medicines the morning of surgery with A SIP OF WATER HYDROcodone-acetaminophen (NORCO/VICODIN), metoprolol tartrate (LOPRESSOR), nitroGLYCERIN (NITROSTAT) if needed, pantoprazole (PROTONIX)   7 days prior to surgery STOP taking any Aspirin, Aleve, Naproxen, Ibuprofen, Motrin, Advil, Goody's, BC's, all herbal medications, fish oil, and all vitamins  WHAT DO I DO ABOUT MY DIABETES MEDICATION?  REDUCE BASAL RATE BY 20% at midnight     How to Manage Your Diabetes Before and After Surgery  Why is it important to control my blood sugar before and after surgery? . Improving blood sugar levels before and after surgery helps healing and can limit problems. . A way of improving blood sugar control is eating a healthy diet by: o  Eating less sugar and carbohydrates o  Increasing activity/exercise o  Talking with your doctor about reaching your blood sugar goals . High blood sugars (greater than 180 mg/dL) can raise your risk of infections and slow your recovery, so you will need to focus on controlling your diabetes during the weeks before surgery. . Make sure that the doctor who takes care of your diabetes knows about your planned surgery including the date and location.  How do I manage my blood sugar before surgery? . Check your blood sugar at least 4 times a day, starting 2 days before surgery, to make sure that the level is not  too high or low. o Check your blood sugar the morning of your surgery when you wake up and every 2 hours until you get to the Short Stay unit. . If your blood sugar is less than 70 mg/dL, you will need to treat for low blood sugar: o Do not take insulin. o Treat a low blood sugar (less than 70 mg/dL) with  cup of clear juice (cranberry or apple), 4 glucose tablets, OR glucose gel. o Recheck blood sugar in 15 minutes after treatment (to make sure it is greater than 70 mg/dL). If your blood sugar is not greater than 70 mg/dL on recheck, call [phone] for further instructions. . Report your blood sugar to the short stay nurse when you get to Short Stay.  . If you are admitted to the hospital after surgery: o Your blood sugar will be checked by the staff and you will probably be given insulin after surgery (instead of oral diabetes medicines) to make sure you have good blood sugar levels. o The goal for blood sugar control after surgery is 80-180 mg/dL.     Do not wear jewelry.  Do not wear lotions, powders, or cologne, or deoderant.  Men may shave face and neck.  Do not bring valuables to the hospital.  [facility] is not responsible for any belongings or valuables.  Contacts, dentures or bridgework may not be worn into surgery.  Leave your suitcase in the car.  After surgery it may be brought to your room.  For patients admitted to the hospital, discharge time will be determined by your treatment team.  Patients discharged the day of surgery will not be allowed to drive home.    Special instructions:   - Preparing For Surgery  Before surgery, you can play an important role. Because skin is not sterile, your skin needs to be as free of germs as possible. You can reduce the number of germs on your skin by washing with CHG (chlorahexidine gluconate) Soap before surgery.  CHG is an antiseptic cleaner which kills germs and bonds with the skin to continue killing germs even  after washing.  Please do not use if you have an allergy to CHG or antibacterial soaps. If your skin becomes reddened/irritated stop using the CHG.  Do not shave (including legs and underarms) for at least 48 hours prior to first CHG shower. It is OK to shave your face.  Please follow these instructions carefully.   1. Shower the NIGHT BEFORE SURGERY and the MORNING OF SURGERY with CHG.   2. If you chose to wash your hair, wash your hair first as usual with your normal shampoo.  3. After you shampoo, rinse your hair and body thoroughly to remove the shampoo.  4. Use CHG as you would any other liquid soap. You can apply CHG directly to the skin and wash gently with a scrungie or a clean washcloth.   5. Apply the CHG Soap to your body ONLY FROM THE NECK DOWN.  Do not use on open wounds or open sores. Avoid contact with your eyes, ears, mouth and genitals (private parts). Wash genitals (private parts) with your normal soap.  6. Wash thoroughly, paying special attention to the area where your surgery will be performed.  7. Thoroughly rinse your body with warm water from the neck down.  8. DO NOT shower/wash with your normal soap after using and rinsing off the CHG Soap.  9. Pat yourself dry with a CLEAN TOWEL.   10. Wear CLEAN PAJAMAS   11. Place CLEAN SHEETS on your bed the night of your first shower and DO NOT SLEEP WITH PETS.    Day of Surgery: Do not apply any deodorants/lotions. Please wear clean clothes to the hospital/surgery center.      Please read over the following fact sheets that you were given.

## 2016-12-27 ENCOUNTER — Encounter (HOSPITAL_COMMUNITY): Payer: Self-pay

## 2016-12-27 ENCOUNTER — Encounter (HOSPITAL_COMMUNITY)
Admission: RE | Admit: 2016-12-27 | Discharge: 2016-12-27 | Disposition: A | Discharge status: Home / Self Care | Payer: Medicare Other | Source: Ambulatory Visit | Attending: Surgery | Admitting: Surgery

## 2016-12-27 DIAGNOSIS — E1151 Type 2 diabetes mellitus with diabetic peripheral angiopathy without gangrene: Secondary | ICD-10-CM | POA: Diagnosis not present

## 2016-12-27 DIAGNOSIS — K219 Gastro-esophageal reflux disease without esophagitis: Secondary | ICD-10-CM | POA: Diagnosis not present

## 2016-12-27 DIAGNOSIS — Z79899 Other long term (current) drug therapy: Secondary | ICD-10-CM | POA: Diagnosis not present

## 2016-12-27 DIAGNOSIS — Z8249 Family history of ischemic heart disease and other diseases of the circulatory system: Secondary | ICD-10-CM | POA: Diagnosis not present

## 2016-12-27 DIAGNOSIS — Z833 Family history of diabetes mellitus: Secondary | ICD-10-CM | POA: Diagnosis not present

## 2016-12-27 DIAGNOSIS — Z01810 Encounter for preprocedural cardiovascular examination: Secondary | ICD-10-CM | POA: Insufficient documentation

## 2016-12-27 DIAGNOSIS — E785 Hyperlipidemia, unspecified: Secondary | ICD-10-CM | POA: Diagnosis not present

## 2016-12-27 DIAGNOSIS — I252 Old myocardial infarction: Secondary | ICD-10-CM | POA: Diagnosis not present

## 2016-12-27 DIAGNOSIS — I959 Hypotension, unspecified: Secondary | ICD-10-CM | POA: Diagnosis not present

## 2016-12-27 DIAGNOSIS — E1122 Type 2 diabetes mellitus with diabetic chronic kidney disease: Secondary | ICD-10-CM | POA: Diagnosis not present

## 2016-12-27 DIAGNOSIS — I745 Embolism and thrombosis of iliac artery: Secondary | ICD-10-CM | POA: Diagnosis not present

## 2016-12-27 DIAGNOSIS — Z87891 Personal history of nicotine dependence: Secondary | ICD-10-CM | POA: Diagnosis not present

## 2016-12-27 DIAGNOSIS — I2510 Atherosclerotic heart disease of native coronary artery without angina pectoris: Secondary | ICD-10-CM | POA: Diagnosis not present

## 2016-12-27 DIAGNOSIS — Z7902 Long term (current) use of antithrombotics/antiplatelets: Secondary | ICD-10-CM | POA: Diagnosis not present

## 2016-12-27 DIAGNOSIS — J449 Chronic obstructive pulmonary disease, unspecified: Secondary | ICD-10-CM | POA: Diagnosis not present

## 2016-12-27 DIAGNOSIS — Z794 Long term (current) use of insulin: Secondary | ICD-10-CM | POA: Diagnosis not present

## 2016-12-27 DIAGNOSIS — N183 Chronic kidney disease, stage 3 (moderate): Secondary | ICD-10-CM | POA: Diagnosis not present

## 2016-12-27 DIAGNOSIS — I129 Hypertensive chronic kidney disease with stage 1 through stage 4 chronic kidney disease, or unspecified chronic kidney disease: Secondary | ICD-10-CM | POA: Diagnosis not present

## 2016-12-27 DIAGNOSIS — Z951 Presence of aortocoronary bypass graft: Secondary | ICD-10-CM | POA: Diagnosis not present

## 2016-12-27 DIAGNOSIS — Z7982 Long term (current) use of aspirin: Secondary | ICD-10-CM | POA: Diagnosis not present

## 2016-12-27 DIAGNOSIS — E7800 Pure hypercholesterolemia, unspecified: Secondary | ICD-10-CM | POA: Diagnosis not present

## 2016-12-27 DIAGNOSIS — E1142 Type 2 diabetes mellitus with diabetic polyneuropathy: Secondary | ICD-10-CM | POA: Diagnosis not present

## 2016-12-27 LAB — COMPREHENSIVE METABOLIC PANEL
ALBUMIN: 3.8 g/dL (ref 3.5–5.0)
ALT: 14 U/L — AB (ref 17–63)
AST: 20 U/L (ref 15–41)
Alkaline Phosphatase: 55 U/L (ref 38–126)
Anion gap: 7 (ref 5–15)
BUN: 8 mg/dL (ref 6–20)
CHLORIDE: 103 mmol/L (ref 101–111)
CO2: 26 mmol/L (ref 22–32)
CREATININE: 1.43 mg/dL — AB (ref 0.61–1.24)
Calcium: 8.4 mg/dL — ABNORMAL LOW (ref 8.9–10.3)
GFR calc Af Amer: 55 mL/min — ABNORMAL LOW (ref >60)
GFR, EST NON AFRICAN AMERICAN: 48 mL/min — AB (ref >60)
GLUCOSE: 219 mg/dL — AB (ref 65–99)
POTASSIUM: 4 mmol/L (ref 3.5–5.1)
SODIUM: 136 mmol/L (ref 135–145)
Total Bilirubin: 0.6 mg/dL (ref 0.3–1.2)
Total Protein: 6.6 g/dL (ref 6.5–8.1)

## 2016-12-27 LAB — CBC
HCT: 35.7 % — ABNORMAL LOW (ref 39.0–52.0)
Hemoglobin: 11.6 g/dL — ABNORMAL LOW (ref 13.0–17.0)
MCH: 27.2 pg (ref 26.0–34.0)
MCHC: 32.5 g/dL (ref 30.0–36.0)
MCV: 83.6 fL (ref 78.0–100.0)
PLATELETS: 250 10*3/uL (ref 150–400)
RBC: 4.27 MIL/uL (ref 4.22–5.81)
RDW: 14.5 % (ref 11.5–15.5)
WBC: 5.4 10*3/uL (ref 4.0–10.5)

## 2016-12-27 LAB — URINALYSIS, ROUTINE W REFLEX MICROSCOPIC
BILIRUBIN URINE: NEGATIVE
Glucose, UA: 50 mg/dL — AB
Hgb urine dipstick: NEGATIVE
KETONES UR: NEGATIVE mg/dL
LEUKOCYTES UA: NEGATIVE
NITRITE: NEGATIVE
PH: 7 (ref 5.0–8.0)
PROTEIN: NEGATIVE mg/dL
Specific Gravity, Urine: 1.006 (ref 1.005–1.030)

## 2016-12-27 LAB — GLUCOSE, CAPILLARY: Glucose-Capillary: 183 mg/dL — ABNORMAL HIGH (ref 65–99)

## 2016-12-27 LAB — PROTIME-INR
INR: 1.07
PROTHROMBIN TIME: 13.9 s (ref 11.4–15.2)

## 2016-12-27 LAB — SURGICAL PCR SCREEN
MRSA, PCR: POSITIVE — AB
STAPHYLOCOCCUS AUREUS: POSITIVE — AB

## 2016-12-27 LAB — APTT: APTT: 28 s (ref 24–36)

## 2016-12-27 NOTE — Progress Notes (Signed)
PCP - Merri BrunetteWalter Pharr Cardiologist - Berry  Chest x-ray - not needed EKG - 11/08/16 Stress Test - 02/23/16 ECHO - 02/23/16 Cardiac Cath - 03/28/16 Sleep Study - denies   Fasting Blood Sugar -80s-120s  Checks Blood Sugar __frequently is on insulin pump___ times a day  Patient was instructed to reduce basal rate by 20% at midnight Patient took his Plavix 1/23 instructed patient to not take anymore Dr. Myra GianottiBrabham instructed patient to stop Plavix 5 days prior but patient said that he forgot.  Dr. Coralee Pesabrabhams office called and message left for RN.  Asked them to call us back   Patient was told by Brabhams office to continue aspirin   Will forward to anesthesia for review and follow up with plavix  Patient denies shortness of breath, fever, cough and chest pain at PAT appointment   Patient verbalized understanding of instructions that was given to them at the PAT appointment. Patient expressed that there were no further questions.  Patient was also instructed that they will need to review over the PAT instructions again at home before the surgery.

## 2016-12-27 NOTE — Progress Notes (Signed)
Mupirocin Ointment Rx called into [facility] in [place] for positive PCR of MRSA and Staph. Pt notified and voiced understanding.

## 2016-12-29 NOTE — Progress Notes (Signed)
Anesthesia Chart Review: Patient is a 71 year old male scheduled for left FPBG on 12/30/16 by Dr. Myra GianottiBrabham.  History includes CAD with NSTEMI 04/11/09 s/p DES LAD and s/p CABG (LIMA-OM1, SVG-OM2, free RIMA-RCA) 04/22/16, mild mitral regurgitation (02/2016 echo), DM with peripheral neuropathy, HLD, PVD (occluded right CIA s/p unsuccessful stent attempt 10/13/16, left proximal CIA stenosis s/p stent 08/22/16), HTN, former smoker (quit '10), COPD, CKD stage III, GERD, carotid artery stenosis.   PCP is Dr. Merri BrunetteWalter Pharr.  Cardiologist is Dr. Nanetta BattyJonathan Berry.  Meds include insulin human pump, Lopressor, lisinopril, ASA 81 mg, Plavix, Nitro, Protonix. He is also on a research study cholesterol medication. Per [facility] patient to continue ASA, but was to hold Plavix (but he forgot, last Plavix dose 12/27/16--message left for [facility] nursing staff).  BP 119/64   Pulse 84   Temp 37 C   Resp (!) 187   Ht 5\' 6"  (1.676 m)   Wt 145 lb 3.2 oz (65.9 kg)   SpO2 96%   BMI 23.44 kg/m   EKG 11/08/16: NSR.  TEE (intra-op CABG) 04/22/16: Study Conclusions - Left ventricle: The cavity size was normal. Wall thickness was   normal. Systolic function was at the lower limits of normal. The   estimated ejection fraction was = 50%. Wall motion was normal;   there were no regional wall motion abnormalities. - Aortic valve: Normal-sized, mildly calcified annulus. Trileaflet;   normal thickness leaflets. There was trivial regurgitation   directed centrally in the LVOT. - Mitral valve: There was mild regurgitation directed centrally.   Valve area by continuity equation (using LVOT flow): 3.07 cm^2. - Left atrium: No evidence of thrombus in the atrial cavity or   appendage. - Atrial septum: No defect or patent foramen ovale was identified.  Echo 02/23/16: Study Conclusions - Left ventricle: The cavity size was normal. Wall thickness was  increased in a pattern of mild LVH. Systolic function was normal.  The estimated ejection  fraction was in the range of 60% to 65%.  Wall motion was normal; there were no regional wall motion  abnormalities. - Aortic valve: Valve area (Vmax): 2.32 cm^2. - Mitral valve: There was mild regurgitation.  Last cath was pre-CABG (03/28/16).  Carotid U/S 12/09/16: Soft plaque, bilaterally. High end 60-79% stenosis in the RICA (per peak systolic velocity and ICA/CCA ratio). 40-59% stenosis in the LICA (per peak systolic velocity and ICA/CCA ratio). Normal subclavian arteries, bilaterally. Patent vertebral arteries with antegrade flow. One year follow-up recommended.  PFTs 04/19/16: FVC 2.77 (73%), FEV1 1.98 (72%), DLCOunc 15.90 (58%).  CXR 05/25/16: IMPRESSION: Improved aeration.  Tiny right pleural effusion remains.  Preoperative labs noted. Cr 1.43. H/H 11.6/35.7. For unclear reasons  A1c was not done with PAT labs. He reports fasting CBG 80-120. If not done recently at Dr. Carolee RotaPharr's office, may need one done during his hospitalization. I sent in-patient DM Coordinator consult re: insulin pump, so they can follow-up with him post-operatively.  Velna Ochsllison Bashar Milam, PA-C [facility] Short Stay Center/Anesthesiology Phone [phone] 12/29/2016 10:45 AM

## 2016-12-30 ENCOUNTER — Inpatient Hospital Stay (HOSPITAL_COMMUNITY): Payer: Medicare Other | Admitting: Vascular Surgery

## 2016-12-30 ENCOUNTER — Encounter (HOSPITAL_COMMUNITY): Payer: Self-pay | Admitting: Certified Registered Nurse Anesthetist

## 2016-12-30 ENCOUNTER — Inpatient Hospital Stay (HOSPITAL_COMMUNITY)
Admission: RE | Admit: 2016-12-30 | Discharge: 2017-01-01 | DRG: 254 | Disposition: A | Discharge status: Home / Self Care | Payer: Medicare Other | Source: Ambulatory Visit | Attending: Surgery | Admitting: Surgery

## 2016-12-30 ENCOUNTER — Encounter (HOSPITAL_COMMUNITY)
Admission: RE | Disposition: A | Discharge status: Home / Self Care | Payer: Self-pay | Source: Ambulatory Visit | Attending: Surgery

## 2016-12-30 ENCOUNTER — Encounter: Payer: Medicare Other | Admitting: Vascular Surgery

## 2016-12-30 DIAGNOSIS — I2510 Atherosclerotic heart disease of native coronary artery without angina pectoris: Secondary | ICD-10-CM | POA: Diagnosis present

## 2016-12-30 DIAGNOSIS — E1122 Type 2 diabetes mellitus with diabetic chronic kidney disease: Secondary | ICD-10-CM | POA: Diagnosis not present

## 2016-12-30 DIAGNOSIS — I252 Old myocardial infarction: Secondary | ICD-10-CM | POA: Diagnosis not present

## 2016-12-30 DIAGNOSIS — J449 Chronic obstructive pulmonary disease, unspecified: Secondary | ICD-10-CM | POA: Diagnosis not present

## 2016-12-30 DIAGNOSIS — Z7982 Long term (current) use of aspirin: Secondary | ICD-10-CM

## 2016-12-30 DIAGNOSIS — M25551 Pain in right hip: Secondary | ICD-10-CM | POA: Diagnosis present

## 2016-12-30 DIAGNOSIS — I129 Hypertensive chronic kidney disease with stage 1 through stage 4 chronic kidney disease, or unspecified chronic kidney disease: Secondary | ICD-10-CM | POA: Diagnosis present

## 2016-12-30 DIAGNOSIS — Z8249 Family history of ischemic heart disease and other diseases of the circulatory system: Secondary | ICD-10-CM | POA: Diagnosis not present

## 2016-12-30 DIAGNOSIS — K219 Gastro-esophageal reflux disease without esophagitis: Secondary | ICD-10-CM | POA: Diagnosis present

## 2016-12-30 DIAGNOSIS — I959 Hypotension, unspecified: Secondary | ICD-10-CM | POA: Diagnosis not present

## 2016-12-30 DIAGNOSIS — N183 Chronic kidney disease, stage 3 (moderate): Secondary | ICD-10-CM | POA: Diagnosis present

## 2016-12-30 DIAGNOSIS — Z951 Presence of aortocoronary bypass graft: Secondary | ICD-10-CM

## 2016-12-30 DIAGNOSIS — Z794 Long term (current) use of insulin: Secondary | ICD-10-CM | POA: Diagnosis not present

## 2016-12-30 DIAGNOSIS — R011 Cardiac murmur, unspecified: Secondary | ICD-10-CM | POA: Diagnosis not present

## 2016-12-30 DIAGNOSIS — Z7902 Long term (current) use of antithrombotics/antiplatelets: Secondary | ICD-10-CM | POA: Diagnosis not present

## 2016-12-30 DIAGNOSIS — I745 Embolism and thrombosis of iliac artery: Secondary | ICD-10-CM | POA: Diagnosis present

## 2016-12-30 DIAGNOSIS — I70211 Atherosclerosis of native arteries of extremities with intermittent claudication, right leg: Secondary | ICD-10-CM | POA: Diagnosis present

## 2016-12-30 DIAGNOSIS — E7800 Pure hypercholesterolemia, unspecified: Secondary | ICD-10-CM | POA: Diagnosis present

## 2016-12-30 DIAGNOSIS — Z833 Family history of diabetes mellitus: Secondary | ICD-10-CM | POA: Diagnosis not present

## 2016-12-30 DIAGNOSIS — Z79899 Other long term (current) drug therapy: Secondary | ICD-10-CM | POA: Diagnosis not present

## 2016-12-30 DIAGNOSIS — E785 Hyperlipidemia, unspecified: Secondary | ICD-10-CM | POA: Diagnosis present

## 2016-12-30 DIAGNOSIS — Z87891 Personal history of nicotine dependence: Secondary | ICD-10-CM | POA: Diagnosis not present

## 2016-12-30 DIAGNOSIS — E1151 Type 2 diabetes mellitus with diabetic peripheral angiopathy without gangrene: Secondary | ICD-10-CM | POA: Diagnosis not present

## 2016-12-30 DIAGNOSIS — E1142 Type 2 diabetes mellitus with diabetic polyneuropathy: Secondary | ICD-10-CM | POA: Diagnosis present

## 2016-12-30 HISTORY — PX: FEMORAL-FEMORAL BYPASS GRAFT: SHX936

## 2016-12-30 LAB — GLUCOSE, CAPILLARY
GLUCOSE-CAPILLARY: 172 mg/dL — AB (ref 65–99)
GLUCOSE-CAPILLARY: 243 mg/dL — AB (ref 65–99)
Glucose-Capillary: 153 mg/dL — ABNORMAL HIGH (ref 65–99)
Glucose-Capillary: 172 mg/dL — ABNORMAL HIGH (ref 65–99)
Glucose-Capillary: 87 mg/dL (ref 65–99)
Glucose-Capillary: 92 mg/dL (ref 65–99)

## 2016-12-30 LAB — PREPARE RBC (CROSSMATCH)

## 2016-12-30 SURGERY — CREATION, BYPASS, ARTERIAL, FEMORAL TO FEMORAL, USING GRAFT
Anesthesia: General | Site: Groin | Laterality: Bilateral

## 2016-12-30 MED ORDER — ROCURONIUM BROMIDE 50 MG/5ML IV SOSY
PREFILLED_SYRINGE | INTRAVENOUS | Status: AC
  Filled 2016-12-30: qty 5

## 2016-12-30 MED ORDER — GUAIFENESIN-DM 100-10 MG/5ML PO SYRP: 15.0000 mL | ORAL_SOLUTION | ORAL | Status: DC | PRN

## 2016-12-30 MED ORDER — PANTOPRAZOLE SODIUM 40 MG PO TBEC
40.0000 mg | DELAYED_RELEASE_TABLET | Freq: Every day | ORAL | Status: DC
  Administered 2016-12-31 – 2017-01-01 (×2): 40 mg via ORAL
  Filled 2016-12-30 (×2): qty 1

## 2016-12-30 MED ORDER — MAGNESIUM SULFATE 2 GM/50ML IV SOLN: 2.0000 g | Freq: Every day | INTRAVENOUS | Status: DC | PRN

## 2016-12-30 MED ORDER — ACETAMINOPHEN 650 MG RE SUPP: 325.0000 mg | RECTAL | Status: DC | PRN

## 2016-12-30 MED ORDER — PHENYLEPHRINE 40 MCG/ML (10ML) SYRINGE FOR IV PUSH (FOR BLOOD PRESSURE SUPPORT)
PREFILLED_SYRINGE | INTRAVENOUS | Status: AC
  Filled 2016-12-30: qty 10

## 2016-12-30 MED ORDER — PROPOFOL 10 MG/ML IV BOLUS
INTRAVENOUS | Status: AC
  Filled 2016-12-30: qty 20

## 2016-12-30 MED ORDER — HEPARIN SODIUM (PORCINE) 1000 UNIT/ML IJ SOLN
INTRAMUSCULAR | Status: DC | PRN
  Administered 2016-12-30: 7000 [IU] via INTRAVENOUS
  Administered 2016-12-30: 1000 [IU] via INTRAVENOUS

## 2016-12-30 MED ORDER — ASPIRIN EC 81 MG PO TBEC
81.0000 mg | DELAYED_RELEASE_TABLET | Freq: Every day | ORAL | Status: DC
Start: 2016-12-31 — End: 2017-01-01
  Administered 2016-12-31 – 2017-01-01 (×2): 81 mg via ORAL
  Filled 2016-12-30 (×2): qty 1

## 2016-12-30 MED ORDER — LACTATED RINGERS IV SOLN
INTRAVENOUS | Status: DC
  Administered 2016-12-30 (×2): via INTRAVENOUS

## 2016-12-30 MED ORDER — METOPROLOL TARTRATE 5 MG/5ML IV SOLN: 2.0000 mg | INTRAVENOUS | Status: DC | PRN

## 2016-12-30 MED ORDER — OXYCODONE HCL 5 MG PO TABS
5.0000 mg | ORAL_TABLET | Freq: Once | ORAL | Status: DC | PRN
  Filled 2016-12-30 (×2): qty 1

## 2016-12-30 MED ORDER — ENOXAPARIN SODIUM 40 MG/0.4ML ~~LOC~~ SOLN
40.0000 mg | SUBCUTANEOUS | Status: DC
  Administered 2016-12-31: 40 mg via SUBCUTANEOUS

## 2016-12-30 MED ORDER — PHENYLEPHRINE HCL 10 MG/ML IJ SOLN
INTRAVENOUS | Status: DC | PRN
  Administered 2016-12-30: 30 ug/min via INTRAVENOUS

## 2016-12-30 MED ORDER — NITROGLYCERIN 0.4 MG SL SUBL: 0.4000 mg | SUBLINGUAL_TABLET | SUBLINGUAL | Status: DC | PRN

## 2016-12-30 MED ORDER — ONDANSETRON HCL 4 MG/2ML IJ SOLN: 4.0000 mg | Freq: Four times a day (QID) | INTRAMUSCULAR | Status: DC | PRN

## 2016-12-30 MED ORDER — ALUM & MAG HYDROXIDE-SIMETH 200-200-20 MG/5ML PO SUSP: 15.0000 mL | ORAL | Status: DC | PRN

## 2016-12-30 MED ORDER — SUGAMMADEX SODIUM 200 MG/2ML IV SOLN
INTRAVENOUS | Status: DC | PRN
  Administered 2016-12-30: 200 mg via INTRAVENOUS

## 2016-12-30 MED ORDER — CHLORHEXIDINE GLUCONATE CLOTH 2 % EX PADS: 6.0000 | MEDICATED_PAD | Freq: Once | CUTANEOUS | Status: DC

## 2016-12-30 MED ORDER — CEFUROXIME SODIUM 1.5 G IJ SOLR
INTRAMUSCULAR | Status: AC
  Filled 2016-12-30: qty 1.5

## 2016-12-30 MED ORDER — HEMOSTATIC AGENTS (NO CHARGE) OPTIME
TOPICAL | Status: DC | PRN
  Administered 2016-12-30: 1 via TOPICAL

## 2016-12-30 MED ORDER — DEXTROSE 5 % IV SOLN
1.5000 g | INTRAVENOUS | Status: AC
  Administered 2016-12-30: 1.5 g via INTRAVENOUS

## 2016-12-30 MED ORDER — SODIUM CHLORIDE 0.9 % IV SOLN
INTRAVENOUS | Status: DC
  Administered 2016-12-30: 50 mL/h via INTRAVENOUS

## 2016-12-30 MED ORDER — LABETALOL HCL 5 MG/ML IV SOLN: 10.0000 mg | INTRAVENOUS | Status: DC | PRN

## 2016-12-30 MED ORDER — HYDROCODONE-ACETAMINOPHEN 5-325 MG PO TABS
1.0000 | ORAL_TABLET | ORAL | Status: DC | PRN
  Administered 2016-12-30 – 2017-01-01 (×4): 2 via ORAL
  Filled 2016-12-30 (×4): qty 2

## 2016-12-30 MED ORDER — ONDANSETRON HCL 4 MG/2ML IJ SOLN: 4.0000 mg | Freq: Once | INTRAMUSCULAR | Status: DC | PRN

## 2016-12-30 MED ORDER — SODIUM CHLORIDE 0.9 % IV SOLN
500.0000 mL | Freq: Once | INTRAVENOUS | Status: AC | PRN
  Administered 2016-12-30: 500 mL via INTRAVENOUS

## 2016-12-30 MED ORDER — FENTANYL CITRATE (PF) 100 MCG/2ML IJ SOLN
25.0000 ug | INTRAMUSCULAR | Status: DC | PRN
  Administered 2016-12-30 (×2): 25 ug via INTRAVENOUS

## 2016-12-30 MED ORDER — 0.9 % SODIUM CHLORIDE (POUR BTL) OPTIME
TOPICAL | Status: DC | PRN
  Administered 2016-12-30: 2000 mL

## 2016-12-30 MED ORDER — SODIUM CHLORIDE 0.9 % IV SOLN
INTRAVENOUS | Status: DC | PRN
  Administered 2016-12-30: 12:00:00 500 mL

## 2016-12-30 MED ORDER — LISINOPRIL 2.5 MG PO TABS
2.5000 mg | ORAL_TABLET | Freq: Every day | ORAL | Status: DC
  Administered 2017-01-01: 2.5 mg via ORAL
  Filled 2016-12-30: qty 1

## 2016-12-30 MED ORDER — PROPOFOL 10 MG/ML IV BOLUS
INTRAVENOUS | Status: DC | PRN
  Administered 2016-12-30: 130 mg via INTRAVENOUS

## 2016-12-30 MED ORDER — SODIUM CHLORIDE 0.9 % IV SOLN
INTRAVENOUS | Status: DC | PRN
  Administered 2016-12-30: 2.2 [IU]/h via INTRAVENOUS

## 2016-12-30 MED ORDER — OXYCODONE HCL 5 MG/5ML PO SOLN: 5.0000 mg | Freq: Once | ORAL | Status: DC | PRN

## 2016-12-30 MED ORDER — DEXTROSE 5 % IV SOLN
1.5000 g | Freq: Two times a day (BID) | INTRAVENOUS | Status: AC
  Administered 2016-12-30 – 2016-12-31 (×2): 1.5 g via INTRAVENOUS
  Filled 2016-12-30 (×2): qty 1.5

## 2016-12-30 MED ORDER — FENTANYL CITRATE (PF) 100 MCG/2ML IJ SOLN
INTRAMUSCULAR | Status: DC | PRN
  Administered 2016-12-30 (×2): 50 ug via INTRAVENOUS
  Administered 2016-12-30: 100 ug via INTRAVENOUS

## 2016-12-30 MED ORDER — PHENOL 1.4 % MT LIQD: 1.0000 | OROMUCOSAL | Status: DC | PRN

## 2016-12-30 MED ORDER — DOCUSATE SODIUM 100 MG PO CAPS
100.0000 mg | ORAL_CAPSULE | Freq: Every day | ORAL | Status: DC
  Administered 2016-12-31 – 2017-01-01 (×2): 100 mg via ORAL
  Filled 2016-12-30 (×2): qty 1

## 2016-12-30 MED ORDER — SODIUM CHLORIDE 0.9 % IV SOLN: INTRAVENOUS | Status: DC

## 2016-12-30 MED ORDER — CLOPIDOGREL BISULFATE 75 MG PO TABS
75.0000 mg | ORAL_TABLET | Freq: Every day | ORAL | Status: DC
  Administered 2016-12-31 – 2017-01-01 (×2): 75 mg via ORAL
  Filled 2016-12-30 (×2): qty 1

## 2016-12-30 MED ORDER — INSULIN ASPART 100 UNIT/ML ~~LOC~~ SOLN: 0.0000 [IU] | Freq: Three times a day (TID) | SUBCUTANEOUS | Status: DC

## 2016-12-30 MED ORDER — ROCURONIUM BROMIDE 100 MG/10ML IV SOLN
INTRAVENOUS | Status: DC | PRN
Start: 2016-12-30 — End: 2016-12-30
  Administered 2016-12-30: 30 mg via INTRAVENOUS
  Administered 2016-12-30: 40 mg via INTRAVENOUS

## 2016-12-30 MED ORDER — FENTANYL CITRATE (PF) 250 MCG/5ML IJ SOLN
INTRAMUSCULAR | Status: AC
  Filled 2016-12-30: qty 5

## 2016-12-30 MED ORDER — OXYCODONE HCL 5 MG/5ML PO SOLN: 5.0000 mg | Freq: Once | ORAL | Status: AC | PRN

## 2016-12-30 MED ORDER — SODIUM CHLORIDE 0.9 % IV SOLN
INTRAVENOUS | Status: DC
  Filled 2016-12-30: qty 2.5

## 2016-12-30 MED ORDER — OXYCODONE HCL 5 MG PO TABS
ORAL_TABLET | ORAL | Status: AC
Start: 2016-12-30 — End: 2016-12-31
  Filled 2016-12-30: qty 1

## 2016-12-30 MED ORDER — PHENYLEPHRINE HCL 10 MG/ML IJ SOLN
INTRAMUSCULAR | Status: DC | PRN
  Administered 2016-12-30: 80 ug via INTRAVENOUS
  Administered 2016-12-30: 120 ug via INTRAVENOUS

## 2016-12-30 MED ORDER — HYDRALAZINE HCL 20 MG/ML IJ SOLN: 5.0000 mg | INTRAMUSCULAR | Status: DC | PRN

## 2016-12-30 MED ORDER — HYDROMORPHONE HCL 1 MG/ML IJ SOLN
0.2500 mg | INTRAMUSCULAR | Status: DC | PRN
  Administered 2016-12-31: 0.5 mg via INTRAVENOUS
  Filled 2016-12-30: qty 1

## 2016-12-30 MED ORDER — LACTATED RINGERS IV SOLN
INTRAVENOUS | Status: DC | PRN
  Administered 2016-12-30 (×2): via INTRAVENOUS

## 2016-12-30 MED ORDER — MORPHINE SULFATE (PF) 2 MG/ML IV SOLN: 2.0000 mg | INTRAVENOUS | Status: DC | PRN

## 2016-12-30 MED ORDER — ONDANSETRON HCL 4 MG/2ML IJ SOLN
INTRAMUSCULAR | Status: AC
  Filled 2016-12-30: qty 2

## 2016-12-30 MED ORDER — FENTANYL CITRATE (PF) 100 MCG/2ML IJ SOLN
INTRAMUSCULAR | Status: AC
  Administered 2016-12-30: 25 ug via INTRAVENOUS
  Filled 2016-12-30: qty 2

## 2016-12-30 MED ORDER — SODIUM CHLORIDE 0.9 % IV SOLN: Freq: Once | INTRAVENOUS | Status: DC

## 2016-12-30 MED ORDER — POTASSIUM CHLORIDE CRYS ER 20 MEQ PO TBCR: 20.0000 meq | EXTENDED_RELEASE_TABLET | Freq: Every day | ORAL | Status: DC | PRN

## 2016-12-30 MED ORDER — LIDOCAINE HCL (CARDIAC) 20 MG/ML IV SOLN
INTRAVENOUS | Status: DC | PRN
Start: 2016-12-30 — End: 2016-12-30
  Administered 2016-12-30 (×2): 50 mg via INTRAVENOUS

## 2016-12-30 MED ORDER — ACETAMINOPHEN 325 MG PO TABS: 325.0000 mg | ORAL_TABLET | ORAL | Status: DC | PRN

## 2016-12-30 MED ORDER — PROTAMINE SULFATE 10 MG/ML IV SOLN
INTRAVENOUS | Status: DC | PRN
  Administered 2016-12-30: 50 mg via INTRAVENOUS

## 2016-12-30 MED ORDER — OXYCODONE HCL 5 MG PO TABS
5.0000 mg | ORAL_TABLET | Freq: Once | ORAL | Status: AC | PRN
  Administered 2016-12-30: 5 mg via ORAL

## 2016-12-30 MED ORDER — ALBUMIN HUMAN 5 % IV SOLN
INTRAVENOUS | Status: DC | PRN
  Administered 2016-12-30 (×2): via INTRAVENOUS

## 2016-12-30 SURGICAL SUPPLY — 55 items
ADH SKN CLS APL DERMABOND .7 (GAUZE/BANDAGES/DRESSINGS) ×1
CANISTER SUCTION 2500CC (MISCELLANEOUS) ×3 IMPLANT
CLIP TI MEDIUM 24 (CLIP) ×3 IMPLANT
CLIP TI WIDE RED SMALL 24 (CLIP) ×3 IMPLANT
COVER PROBE W GEL 5X96 (DRAPES) ×3 IMPLANT
DERMABOND ADVANCED (GAUZE/BANDAGES/DRESSINGS) ×2
DERMABOND ADVANCED .7 DNX12 (GAUZE/BANDAGES/DRESSINGS) ×1 IMPLANT
DRAIN CHANNEL 15F RND FF W/TCR (WOUND CARE) IMPLANT
ELECT CAUTERY BLADE 6.4 (BLADE) ×3 IMPLANT
ELECT REM PT RETURN 9FT ADLT (ELECTROSURGICAL) ×3
ELECTRODE REM PT RTRN 9FT ADLT (ELECTROSURGICAL) ×1 IMPLANT
EVACUATOR SILICONE 100CC (DRAIN) IMPLANT
FELT TEFLON 1X6 (MISCELLANEOUS) ×3 IMPLANT
GLOVE BIO SURGEON STRL SZ 6.5 (GLOVE) ×4 IMPLANT
GLOVE BIO SURGEON STRL SZ7 (GLOVE) ×3 IMPLANT
GLOVE BIO SURGEON STRL SZ7.5 (GLOVE) ×3 IMPLANT
GLOVE BIO SURGEONS STRL SZ 6.5 (GLOVE) ×2
GLOVE BIOGEL PI IND STRL 6.5 (GLOVE) ×4 IMPLANT
GLOVE BIOGEL PI IND STRL 7.0 (GLOVE) ×1 IMPLANT
GLOVE BIOGEL PI IND STRL 7.5 (GLOVE) ×1 IMPLANT
GLOVE BIOGEL PI IND STRL 8 (GLOVE) ×1 IMPLANT
GLOVE BIOGEL PI INDICATOR 6.5 (GLOVE) ×8
GLOVE BIOGEL PI INDICATOR 7.0 (GLOVE) ×2
GLOVE BIOGEL PI INDICATOR 7.5 (GLOVE) ×2
GLOVE BIOGEL PI INDICATOR 8 (GLOVE) ×2
GLOVE SKINSENSE NS SZ6.5 (GLOVE) ×2
GLOVE SKINSENSE NS SZ7.0 (GLOVE) ×6
GLOVE SKINSENSE STRL SZ6.5 (GLOVE) ×1 IMPLANT
GLOVE SKINSENSE STRL SZ7.0 (GLOVE) ×3 IMPLANT
GLOVE SURG SS PI 7.5 STRL IVOR (GLOVE) ×3 IMPLANT
GOWN STRL REUS W/ TWL LRG LVL3 (GOWN DISPOSABLE) ×4 IMPLANT
GOWN STRL REUS W/ TWL XL LVL3 (GOWN DISPOSABLE) ×2 IMPLANT
GOWN STRL REUS W/TWL LRG LVL3 (GOWN DISPOSABLE) ×12
GOWN STRL REUS W/TWL XL LVL3 (GOWN DISPOSABLE) ×6
GRAFT HEMASHIELD 8MM (Vascular Products) ×3 IMPLANT
GRAFT VASC STRG 30X8KNIT (Vascular Products) ×1 IMPLANT
HEMOSTAT SNOW SURGICEL 2X4 (HEMOSTASIS) ×6 IMPLANT
KIT BASIN OR (CUSTOM PROCEDURE TRAY) ×3 IMPLANT
KIT ROOM TURNOVER OR (KITS) ×3 IMPLANT
NS IRRIG 1000ML POUR BTL (IV SOLUTION) ×6 IMPLANT
PACK PERIPHERAL VASCULAR (CUSTOM PROCEDURE TRAY) ×3 IMPLANT
PAD ARMBOARD 7.5X6 YLW CONV (MISCELLANEOUS) ×6 IMPLANT
PENCIL BUTTON HOLSTER BLD 10FT (ELECTRODE) ×3 IMPLANT
SPONGE LAP 18X18 X RAY DECT (DISPOSABLE) ×3 IMPLANT
SUT PROLENE 5 0 C 1 24 (SUTURE) ×21 IMPLANT
SUT PROLENE 6 0 BV (SUTURE) ×33 IMPLANT
SUT VIC AB 2-0 CT1 27 (SUTURE) ×6
SUT VIC AB 2-0 CT1 TAPERPNT 27 (SUTURE) ×2 IMPLANT
SUT VIC AB 2-0 CTB1 (SUTURE) ×3 IMPLANT
SUT VIC AB 3-0 SH 27 (SUTURE) ×6
SUT VIC AB 3-0 SH 27X BRD (SUTURE) ×2 IMPLANT
SUT VICRYL 4-0 PS2 18IN ABS (SUTURE) ×6 IMPLANT
TAPE UMBILICAL COTTON 1/8X30 (MISCELLANEOUS) ×3 IMPLANT
TRAY FOLEY W/METER SILVER 16FR (SET/KITS/TRAYS/PACK) ×3 IMPLANT
WATER STERILE IRR 1000ML POUR (IV SOLUTION) ×3 IMPLANT

## 2016-12-30 NOTE — Progress Notes (Addendum)
Inpatient Diabetes Program Recommendations  AACE/ADA: New Consensus Statement on Inpatient Glycemic Control (2015)  Target Ranges:  Prepandial:   less than 140 mg/dL      Peak postprandial:   less than 180 mg/dL (1-2 hours)      Critically ill patients:  140 - 180 mg/dL   Results for Derrick [name], Derrick [name] (MRN [id number]) as of 12/30/2016 14:44  Ref. Range 12/30/2016 10:26 12/30/2016 14:06  Glucose-Capillary Latest Ref Range: 65 - 99 mg/dL 153 (H) 172 (H)    Admit with: Fem-Pop Bypass  History: DM  Home DM Meds: Insulin Pump (see below for settings)  Current Insulin Orders: IV Insulin drip (started at 2pm today)       Met with pt's wife in the waiting area.  Pt's wife was able to turn on pt's insulin pump and we were able to review all pt's insulin pump settings together.    No changes were made to the pump.  Settings only reviewed and pump turned back off.    MD- When patient is finally awake post-op, he can resume his insulin pump if able to do so.    If patient is alert and able to independently manage insulin pump, have pt resume pump and then turn IV Insulin drip off one hour after pump resumed  If you feel patient is not safe to resume his insulin pump independently, recommend starting patient on SQ insulin regimen until he is able to manage pump by himself.    Would give patient the following insulin:  Lantus 36 units daily (give 1st dose 1 hour before IV Insulin drip stopped) +   Novolog Sensitive Correction Scale/ SSI (0-9 units) TID AC + HS +  Novolog 4 units TID with meals (hold if pt eats <50% of meal)   If patient needs to use SQ regimen instead of insulin pump, he will need to wait 24 hours after last dose Lantus given before he resumes the basal rates on his pump.    --Insulin Pump Settings--  Basal Rates: 1.5 units/hour  Total Basal Insulin per 24 hours period= 36 units  Carbohydrate Ratio: 1 unit for every 11 Grams of  Carbohydrates  Correction/Sensitivity Factor: 1 unit for every 42 mg/dl above Target CBG  Target CBG: 12am: 110-130 mg/dl   5am: 100-120 mg/dl   10pm: 110-130 mg/dl     --Will follow patient during hospitalization--  Wyn Quaker RN, MSN, CDE Diabetes Coordinator Inpatient Glycemic Control Team Team Pager: [phone] (8a-5p)

## 2016-12-30 NOTE — Anesthesia Postprocedure Evaluation (Signed)
Anesthesia Post Note  Patient: Derrick [name]  Procedure(s) Performed: Procedure(s) (LRB): BYPASS GRAFT LEFT FEMORAL-RIGHT FEMORAL ARTERY (Bilateral)  Patient location during evaluation: PACU Anesthesia Type: General Level of consciousness: awake and alert Pain management: pain level controlled Vital Signs Assessment: post-procedure vital signs reviewed and stable Respiratory status: spontaneous breathing, nonlabored ventilation, respiratory function stable and patient connected to nasal cannula oxygen Cardiovascular status: blood pressure returned to baseline and stable Postop Assessment: no signs of nausea or vomiting Anesthetic complications: no       Last Vitals:  Vitals:   12/30/16 1915 12/30/16 1930  BP: (!) 115/57 (!) 105/59  Pulse: 69 67  Resp: 17 12  Temp:  36.7 C    Last Pain:  Vitals:   12/30/16 1915  TempSrc:   PainSc: 6                  Aldridge Krzyzanowski,W. EDMOND

## 2016-12-30 NOTE — Anesthesia Preprocedure Evaluation (Signed)
Anesthesia Evaluation  Patient identified by MRN, date of birth, ID band Patient awake    Reviewed: Allergy & Precautions, NPO status , Patient's Chart, lab work & pertinent test results  Airway Mallampati: II  TM Distance: >3 FB     Dental  (+) Edentulous Upper, Edentulous Lower   Pulmonary former smoker,    breath sounds clear to auscultation       Cardiovascular hypertension,  Rhythm:Regular Rate:Normal     Neuro/Psych    GI/Hepatic   Endo/Other  diabetes  Renal/GU      Musculoskeletal   Abdominal   Peds  Hematology   Anesthesia Other Findings   Reproductive/Obstetrics                             Anesthesia Physical Anesthesia Plan  ASA: III  Anesthesia Plan: General   Post-op Pain Management:    Induction: Intravenous  Airway Management Planned: Oral ETT  Additional Equipment:   Intra-op Plan:   Post-operative Plan:   Informed Consent: I have reviewed the patients History and Physical, chart, labs and discussed the procedure including the risks, benefits and alternatives for the proposed anesthesia with the patient or authorized representative who has indicated his/her understanding and acceptance.     Plan Discussed with: CRNA and Anesthesiologist  Anesthesia Plan Comments:         Anesthesia Quick Evaluation

## 2016-12-30 NOTE — Interval H&P Note (Signed)
History and Physical Interval Note:  12/30/2016 12:34 PM  Derrick [name]  has presented today for surgery, with the diagnosis of Peripheral vascular disease with right lower extremity claudication I70.211  The various methods of treatment have been discussed with the patient and family. After consideration of risks, benefits and other options for treatment, the patient has consented to  Procedure(s): BYPASS GRAFT LEFT FEMORAL-RIGHT FEMORAL ARTERY (Bilateral) as a surgical intervention .  The patient's history has been reviewed, patient examined, no change in status, stable for surgery.  I have reviewed the patient's chart and labs.  Questions were answered to the patient's satisfaction.     Durene CalBrabham, [place]

## 2016-12-30 NOTE — Op Note (Signed)
    Patient name: Derrick [name] MRN: [id number] DOB: 04/17/1946 Sex: male  12/30/2016 Pre-operative Diagnosis: Right leg claudication Post-operative diagnosis:  Same Surgeon:  Durene CalBrabham, Wells Assistants:  Cari Carawayhris Dickson, Karsten RoKim Trinh Procedure:   Left to right femoral-femoral bypass graft (8 mm dacryon) Anesthesia:  Gen. Blood Loss:  See anesthesia record Specimens:  None  Findings:  Extremely dense scar tissue and inflammation in both groins.  The artery was encased within a thickened rind.  Indications:  Patient is suffering from right hip pain and claudication.  He is found to have an occluded right iliac artery.  He comes in today for the femoral-femoral bypass graft  Procedure:  The patient was identified in the holding area and [facility]  The patient was then placed supine on the table. general anesthesia was administered.  The patient was prepped and draped in the usual sterile fashion.  A time out was called and antibiotics were administered.  Longitudinal incisions were made in each groin.  I initially exposed the left common femoral artery.  It was difficult to feel a pulse within the artery because of the dense fibrous tissue encasing the artery.  Ultimately I was able to expose the artery from anal ligament down to the profunda origin.  Next, the right common femoral artery was exposed.  Again there was a dense inflammatory reaction around the artery.  I isolated superficial femoral and profunda femoral artery.  There was a linear tear along the common femoral artery which required pledgeted sutures to repair.  In addition there was a tear to a proximal profunda branch which had to be ligated.  Once I was able to get hemostasis, I created a suprapubic tunnel directly anterior to the rectus fascia.  The patient was fully heparinized.  Because of the longitudinal tear in the distal right common femoral artery, I elected to plug in the graft in the mid common femoral artery so as  to avoid this area.  Similarly in the left femoral artery, the arteriotomy was in the mid common femoral artery.  An 8 mm dacryon graft was utilized.  Running anastomosis were created with 5-0 Prolene.  Prior to completion the appropriate flushing maneuvers were performed the anastomosis were completed.  There were excellent Doppler signals in both groins.  There was audible change in the right groin with graft compression.  Next, 50 mg of protamine was used to reverse the heparin.  Once hemostasis was satisfactory, the incisions were closed with multiple layers of 2-0, 3-0, 4-0 Vicryl followed by Dermabond.  There no complications.  Patient taken recovery in stable condition.   Disposition:  To PACU in stable condition.   Juleen ChinaV. Wells Macaila Tahir, M.D. [facility] [place] Office: [phone] Pager:  [phone]

## 2016-12-30 NOTE — H&P (View-Only) (Signed)
[facility] [place]  Patient name: Derrick [name] MRN: [id number] DOB: Jun 14, 1946 Sex: male   REFERRING PROVIDER:    Dr. Gwenlyn Found   REASON FOR CONSULT:    Right hip pain  HISTORY OF PRESENT ILLNESS:   Derrick [name] is a 71 y.o. male, who is Referred today for evaluation of right hip pain.  He states that he has pain in his hips with activity and this improves with rest.  He does not have to walk a long distance before he has problems.  Occasionally he will have pain at rest.  He recently underwent angiography which revealed severe disease within the left iliac system and an occluded right iliac system.  The left side was able to be stented, however the chronic occlusion on the right could not be crossed.  He is referred today for surgical revascularization  The patient suffers from chronic renal insufficiency, stage III.  He has a history of COPD.  He is a former smoker.  He had a heart attack in 2010 and in May 2017 underwent coronary artery bypass grafting.  The patient is a diabetic.  His most recent hemoglobin A1c was 7.4 in May 2017.  He suffers from hypercholesterolemia  Past Medical History:  Diagnosis Date  . Anginal pain ([place])    occ   . Chronic renal insufficiency, stage III (moderate) 2013   CrCl est high 50s (borderline stage II/III pt denies  . COPD (chronic obstructive pulmonary disease) ([place])    pt denies on 10/13/2016  . Coronary atherosclerosis of native coronary vessel    S/P NSTEMI 04/11/2009--DEL stent to LAD.  Nuclear stress test 11/2010 NEG, EF normal  . Diabetic peripheral neuropathy ([facility])    Painful; 1 vicodin bid very helpful (pt resistant to alternative tx's)  . Early cataracts, bilateral 04/24/2012   [name] in [place], [place]  . GERD (gastroesophageal reflux disease)   . Heart murmur   . Herpes zoster    Two separate outbreaks--one on left axillary/back region, one on right axillary/back region   . Hyperlipidemia   . Hypertension   . Mitral regurgitation 08/2013   Moderate (echo)  . Myocardial infarction 2010  . Peripheral arterial disease ([place])   . S/P arterial stent, 08/22/16 Lt [facility] PTA/Stent  08/23/2016  . Type II diabetes mellitus ([place])      FAMILY HISTORY   Family History  Problem Relation Age of Onset  . Cancer Mother     brain  . Diabetes Father   . Heart disease Father   . Diabetes Brother   . Heart disease Brother   . Diabetes Son   . Hyperlipidemia Son   . Coronary artery disease      family hx of    SOCIAL HISTORY:   Social History   Social History  . Marital status: Married    Spouse name: N/A  . Number of children: N/A  . Years of education: N/A   Occupational History  . Not on file.   Social History Main Topics  . Smoking status: Former Smoker    Packs/day: 1.00    Years: 50.00    Types: Cigarettes    Quit date: 01/06/2009  . Smokeless tobacco: Never Used  . Alcohol use No     Comment: "drank some in high school"  . Drug use: No  . Sexual activity: No   Other Topics Concern  . Not on file   Social History Narrative  Married, works part time at [employer] in [place], [place].   Former smoker: quit 01/2011.  No alcohol or drugs.   No formal exercise.    Allergies  Allergen Reactions  . Atorvastatin     Muscle cramps   . Lovastatin     Muscle cramps  . Simvastatin Other (See Comments)    Muscle pain  . Pravastatin Other (See Comments)    Muscle cramps    Current Outpatient Prescriptions  Medication Sig Dispense Refill  . aspirin EC 81 MG EC tablet Take 1 tablet (81 mg total) by mouth daily.    . Blood Glucose Monitoring Suppl (ONE TOUCH ULTRA SYSTEM KIT) W/DEVICE KIT Patient must check sugar TID 1 each 0  . clopidogrel (PLAVIX) 75 MG tablet Take 1 tablet (75 mg total) by mouth daily with breakfast. 30 tablet 11  . glucose blood (ONE TOUCH ULTRA TEST) test strip USE ONE STRIP TO CHECK GLUCOSE 4 TIMES DAILY AS NEEDED  100 each 12  . HYDROcodone-acetaminophen (NORCO/VICODIN) 5-325 MG tablet Take 1 tablet by mouth every 6 (six) hours as needed for moderate pain. (Patient taking differently: Take 1 tablet by mouth every evening. ) 40 tablet 0  . Insulin Human (INSULIN PUMP) SOLN Inject 1 each into the skin continuous. 2.7 small, 5.5 medium, 8.5 large meal settings  Humalog    . metoprolol tartrate (LOPRESSOR) 25 MG tablet Take 0.5 tablets (12.5 mg total) by mouth 2 (two) times daily. (Patient taking differently: Take 12.5 mg by mouth daily. ) 90 tablet 1  . nitroGLYCERIN (NITROSTAT) 0.4 MG SL tablet Place 1 tablet (0.4 mg total) under the tongue every 5 (five) minutes as needed. (Patient taking differently: Place 0.4 mg under the tongue every 5 (five) minutes as needed for chest pain. ) 20 tablet 1  . ONETOUCH DELICA LANCETS 43P MISC USE ONE  4 TIMES DAILY AS NEEDED 100 each 0  . pantoprazole (PROTONIX) 40 MG tablet Take 1 tablet (40 mg total) by mouth daily. 30 tablet 6  . research study medication Take 1 each by mouth daily. Study drug for cholesterol from Dr. Deland Pretty     No current facility-administered medications for this visit.     REVIEW OF SYSTEMS:   _0  denotes positive finding, _1  denotes negative finding Cardiac  Comments:  Chest pain or chest pressure:    Shortness of breath upon exertion:    Short of breath when lying flat:    Irregular heart rhythm:        Vascular    Pain in calf, thigh, or hip brought on by ambulation: x   Pain in feet at night that wakes you up from your sleep:     Blood clot in your veins:    Leg swelling:         Pulmonary    Oxygen at home:    Productive cough:     Wheezing:         Neurologic    Sudden weakness in arms or legs:     Sudden numbness in arms or legs:     Sudden onset of difficulty speaking or slurred speech:    Temporary loss of vision in one eye:     Problems with dizziness:         Gastrointestinal    Blood in stool:      Vomited  blood:         Genitourinary    Burning when urinating:  Blood in urine:        Psychiatric    Major depression:         Hematologic    Bleeding problems:    Problems with blood clotting too easily:        Skin    Rashes or ulcers:        Constitutional    Fever or chills:     PHYSICAL EXAM:   Vitals:   12/16/16 1256  BP: 118/73  Pulse: 82  Resp: 16  Temp: 98 F (36.7 C)  TempSrc: Oral  SpO2: 98%  Weight: 145 lb (65.8 kg)  Height: _0  (1.676 m)    GENERAL: The patient is a well-nourished male, in no acute distress. The vital signs are documented above. CARDIAC: There is a regular rate and rhythm.  VASCULAR: Palpable left femoral pulse PULMONARY: Nonlabored respirations ABDOMEN: Soft and non-tender with normal pitched bowel sounds.  MUSCULOSKELETAL: There are no major deformities or cyanosis. NEUROLOGIC: No focal weakness or paresthesias are detected. SKIN: There are no ulcers or rashes noted. PSYCHIATRIC: The patient has a normal affect.  STUDIES:   Arteriogram: I have reviewed the images.  The left iliac system is widely patent with a proximal stent.  The right iliac system is occluded.  The patient also has an occluded left superficial femoral artery Carotid Doppler studies: The patient has 60-79% right sided carotid stenosis  ASSESSMENT and PLAN   Right hip claudication: I reviewed the arteriogram with the patient.  I discussed our options including aortobifemoral bypass graft versus left to right femoral-femoral bypass graft.  Because of his comorbidities, we have elected to proceed with a left-to-right femoral-femoral bypass graft.  I discussed the risks and benefits of the operation including bypass graft failure, infection, and cardioplegic complications.  All of his questions were answered.  He will need to be off of his Plavix for 5 days.  His operations been scheduled for Friday, January 26   Annamarie Major, MD [facility] [phone] Pager [phone]

## 2016-12-30 NOTE — Transfer of Care (Signed)
Immediate Anesthesia Transfer of Care Note  Patient: Derrick RedderJerry E Cotrell  Procedure(s) Performed: Procedure(s): BYPASS GRAFT LEFT FEMORAL-RIGHT FEMORAL ARTERY (Bilateral)  Patient Location: PACU  Anesthesia Type:General  Level of Consciousness: sedated  Airway & Oxygen Therapy: Patient Spontanous Breathing and Patient connected to face mask oxygen  Post-op Assessment: Report given to RN and Post -op Vital signs reviewed and stable  Post vital signs: Reviewed and stable  Last Vitals:  Vitals:   12/30/16 1019 12/30/16 1731  BP: (!) 142/73   Pulse: 70   Resp: 18   Temp: 36.5 C 36.7 C    Last Pain:  Vitals:   12/30/16 1019  TempSrc: Oral      Patients Stated Pain Goal: 4 (12/30/16 1039)  Complications: No apparent anesthesia complications

## 2016-12-30 NOTE — Anesthesia Procedure Notes (Signed)
Procedure Name: Intubation Date/Time: 12/30/2016 1:26 PM Performed by: Rejeana Brock L Pre-anesthesia Checklist: Patient identified, Emergency Drugs available, Suction available and Patient being monitored Patient Re-evaluated:Patient Re-evaluated prior to inductionOxygen Delivery Method: Circle System Utilized Preoxygenation: Pre-oxygenation with 100% oxygen Intubation Type: IV induction Ventilation: Mask ventilation without difficulty Laryngoscope Size: Mac and 3 Grade View: Grade I Tube type: Oral Tube size: 7.5 mm Number of attempts: 1 Airway Equipment and Method: Stylet and Oral airway Placement Confirmation: ETT inserted through vocal cords under direct vision,  positive ETCO2 and breath sounds checked- equal and bilateral Secured at: 22 cm Tube secured with: Tape Dental Injury: Teeth and Oropharynx as per pre-operative assessment

## 2016-12-31 LAB — BASIC METABOLIC PANEL
Anion gap: 7 (ref 5–15)
Anion gap: 8 (ref 5–15)
BUN: 14 mg/dL (ref 6–20)
BUN: 15 mg/dL (ref 6–20)
CALCIUM: 8.1 mg/dL — AB (ref 8.9–10.3)
CHLORIDE: 102 mmol/L (ref 101–111)
CO2: 25 mmol/L (ref 22–32)
CO2: 25 mmol/L (ref 22–32)
Calcium: 8.1 mg/dL — ABNORMAL LOW (ref 8.9–10.3)
Chloride: 102 mmol/L (ref 101–111)
Creatinine, Ser: 1.49 mg/dL — ABNORMAL HIGH (ref 0.61–1.24)
Creatinine, Ser: 1.54 mg/dL — ABNORMAL HIGH (ref 0.61–1.24)
GFR calc Af Amer: 51 mL/min — ABNORMAL LOW (ref >60)
GFR calc Af Amer: 53 mL/min — ABNORMAL LOW (ref >60)
GFR calc non Af Amer: 44 mL/min — ABNORMAL LOW (ref >60)
GFR, EST NON AFRICAN AMERICAN: 45 mL/min — AB (ref >60)
GLUCOSE: 165 mg/dL — AB (ref 65–99)
GLUCOSE: 238 mg/dL — AB (ref 65–99)
POTASSIUM: 4.3 mmol/L (ref 3.5–5.1)
Potassium: 4.7 mmol/L (ref 3.5–5.1)
Sodium: 134 mmol/L — ABNORMAL LOW (ref 135–145)
Sodium: 135 mmol/L (ref 135–145)

## 2016-12-31 LAB — GLUCOSE, CAPILLARY
GLUCOSE-CAPILLARY: 109 mg/dL — AB (ref 65–99)
Glucose-Capillary: 130 mg/dL — ABNORMAL HIGH (ref 65–99)
Glucose-Capillary: 157 mg/dL — ABNORMAL HIGH (ref 65–99)
Glucose-Capillary: 183 mg/dL — ABNORMAL HIGH (ref 65–99)

## 2016-12-31 LAB — CBC
HEMATOCRIT: 27.5 % — AB (ref 39.0–52.0)
HEMATOCRIT: 26.1 % — AB (ref 39.0–52.0)
HEMOGLOBIN: 8.8 g/dL — AB (ref 13.0–17.0)
Hemoglobin: 8.5 g/dL — ABNORMAL LOW (ref 13.0–17.0)
MCH: 26.7 pg (ref 26.0–34.0)
MCH: 26.8 pg (ref 26.0–34.0)
MCHC: 32 g/dL (ref 30.0–36.0)
MCHC: 32.6 g/dL (ref 30.0–36.0)
MCV: 83.3 fL (ref 78.0–100.0)
MCV: 82.3 fL (ref 78.0–100.0)
Platelets: 183 10*3/uL (ref 150–400)
Platelets: 186 10*3/uL (ref 150–400)
RBC: 3.17 MIL/uL — ABNORMAL LOW (ref 4.22–5.81)
RBC: 3.3 MIL/uL — AB (ref 4.22–5.81)
RDW: 14.7 % (ref 11.5–15.5)
RDW: 14.1 % (ref 11.5–15.5)
WBC: 6.8 10*3/uL (ref 4.0–10.5)
WBC: 8.2 10*3/uL (ref 4.0–10.5)

## 2016-12-31 LAB — HEMOGLOBIN A1C
Hgb A1c MFr Bld: 6.7 % — ABNORMAL HIGH (ref 4.8–5.6)
Mean Plasma Glucose: 146 mg/dL

## 2016-12-31 MED ORDER — INSULIN PUMP
Freq: Three times a day (TID) | SUBCUTANEOUS | Status: DC
  Administered 2016-12-31: 7.2 via SUBCUTANEOUS
  Administered 2016-12-31: 8.7 via SUBCUTANEOUS
  Filled 2016-12-31: qty 1

## 2016-12-31 NOTE — Evaluation (Signed)
Physical Therapy Evaluation Patient Details Name: Derrick [name] MRN: [id number] DOB: [birth year] Today's Date: 12/31/2016   History of Present Illness  Pt is a 71 y/o male s/p L to R femoral-femoral bypass graft secondary to R LE claudication. PMH including but not limited to COPD, DM, Diabetic peripheral neuropathy, HTN, MI in 2010 and PAD.  Clinical Impression  Pt presented supine in bed with HOB elevated, awake and willing to participate in therapy session. Prior to admission, pt reported that he was independent with all functional mobility and ADLs. Pt currently requires supervision for bed mobility, transfers and ambulation with no AD. Pt limited in distance ambulated this session secondary to R LE pain; however, pt reported that he ambulated with RN in hallway this AM with no issues. Pt would continue to benefit from skilled physical therapy services at this time while admitted to address his below listed limitations in order to improve his overall safety and independence with functional mobility.       Follow Up Recommendations No PT follow up    Equipment Recommendations  None recommended by PT    Recommendations for Other Services       Precautions / Restrictions Precautions Precautions: None Restrictions Weight Bearing Restrictions: No      Mobility  Bed Mobility Overal bed mobility: Needs Assistance Bed Mobility: Supine to Sit     Supine to sit: Supervision;HOB elevated     General bed mobility comments: increased time, supervision for safety  Transfers Overall transfer level: Needs assistance Equipment used: None Transfers: Sit to/from Stand Sit to Stand: Supervision         General transfer comment: increased time, supervision for safety  Ambulation/Gait Ambulation/Gait assistance: Supervision Ambulation Distance (Feet): 25 Feet Assistive device: None Gait Pattern/deviations: Step-through pattern;Decreased stride length Gait velocity:  decreased Gait velocity interpretation: Below normal speed for age/gender General Gait Details: no instability or LOB. pt limited in distance secondary to pain; however, he reported that he ambulated in hallway to nurse's station this AM with his RN  Stairs            Wheelchair Mobility    Modified Rankin (Stroke Patients Only)       Balance Overall balance assessment: Needs assistance Sitting-balance support: Feet supported;No upper extremity supported Sitting balance-Leahy Scale: Good     Standing balance support: During functional activity;No upper extremity supported Standing balance-Leahy Scale: Fair                               Pertinent Vitals/Pain Pain Assessment: 0-10 Pain Score: 2  Pain Location: R anterior thigh Pain Descriptors / Indicators: Sore Pain Intervention(s): Monitored during session;Repositioned    Home Living Family/patient expects to be discharged to:: Private residence Living Arrangements: Spouse/significant other Available Help at Discharge: Family;Available 24 hours/day   Home Access: Level entry     Home Layout: One level Home Equipment: Crutches      Prior Function Level of Independence: Independent               Hand Dominance        Extremity/Trunk Assessment   Upper Extremity Assessment Upper Extremity Assessment: Overall WFL for tasks assessed    Lower Extremity Assessment Lower Extremity Assessment: Overall WFL for tasks assessed    Cervical / Trunk Assessment Cervical / Trunk Assessment: Normal  Communication   Communication: No difficulties  Cognition Arousal/Alertness: Awake/alert Behavior During Therapy: WFL for tasks assessed/performed  Overall Cognitive Status: Within Functional Limits for tasks assessed                      General Comments      Exercises     Assessment/Plan    PT Assessment Patient needs continued PT services  PT Problem List Decreased activity  tolerance;Decreased balance;Decreased mobility;Decreased coordination;Pain          PT Treatment Interventions DME instruction;Gait training;Stair training;Functional mobility training;Therapeutic activities;Therapeutic exercise;Balance training;Neuromuscular re-education;Patient/family education    PT Goals (Current goals can be found in the Care Plan section)  Acute Rehab PT Goals Patient Stated Goal: return home tomorrow PT Goal Formulation: With patient Time For Goal Achievement: 01/14/17 Potential to Achieve Goals: Good    Frequency Min 3X/week   Barriers to discharge        Co-evaluation               End of Session   Activity Tolerance: Patient limited by pain Patient left: in chair;with call bell/phone within reach;with chair alarm set Nurse Communication: Mobility status         Time: [id number] PT Time Calculation (min) (ACUTE ONLY): 16 min   Charges:   PT Evaluation $PT Eval Low Complexity: 1 Procedure     PT G CodesAlessandra Bevels:        Helen Winterhalter M Pratham Cassatt 12/31/2016, 10:18 AM Deborah ChalkJennifer Melisha Eggleton, PT, DPT [phone]

## 2016-12-31 NOTE — Progress Notes (Addendum)
[facility] [place]  Subjective  -  Doing OK, pain in groins.  He feels like his right foot falls asleep.   Objective (!) 100/54 76 98.5 F (36.9 C) (Oral) 14 100%  Intake/Output Summary (Last 24 hours) at 12/31/16 1015 Last data filed at 12/31/16 0900  Gross per 24 hour  Intake             3685 ml  Output             2350 ml  Net             1335 ml    Groins soft, palpable graft pulse Doppler B DP signals Active range of motion intact B LE Heart RRR Lungs non labored breathing  Assessment/Planning: POD # 1 Left to right femoral-femoral bypass graft (8 mm dacryon)  Blood loss peri op 7oo cc. Hypotensive received 2 500 cc bolus's since surgery Checking CBC Bmet Keep one more night for observation   Clinton GallantCOLLINS, EMMA [facility] 12/31/2016 10:15 AM --  Laboratory Lab Results:  Recent Labs  12/30/16 2345  WBC 8.2  HGB 8.5*  HCT 26.1*  PLT 186   BMET  Recent Labs  12/30/16 2345  NA 135  K 4.7  CL 102  CO2 25  GLUCOSE 238*  BUN 14  CREATININE 1.54*  CALCIUM 8.1*    COAG Lab Results  Component Value Date   INR 1.07 12/27/2016   INR 1.0 10/07/2016   INR 1.0 09/23/2016   No results found for: PTT  I have independently interviewed patient and agree with PA assessment and plan above. BP has improved. H/H are stable. Continue to monitor tonight. oob as tolerated.   Terelle Dobler C. Randie Heinzain, MD [facility] [place] Office: [phone] Pager: [phone]

## 2017-01-01 ENCOUNTER — Other Ambulatory Visit: Payer: Self-pay | Admitting: *Deleted

## 2017-01-01 DIAGNOSIS — I739 Peripheral vascular disease, unspecified: Secondary | ICD-10-CM

## 2017-01-01 DIAGNOSIS — Z48812 Encounter for surgical aftercare following surgery on the circulatory system: Secondary | ICD-10-CM

## 2017-01-01 LAB — GLUCOSE, CAPILLARY: Glucose-Capillary: 123 mg/dL — ABNORMAL HIGH (ref 65–99)

## 2017-01-01 MED ORDER — HYDROCODONE-ACETAMINOPHEN 5-325 MG PO TABS: 1.0000 | ORAL_TABLET | ORAL | 0 refills | Status: DC | PRN

## 2017-01-01 NOTE — Progress Notes (Addendum)
[facility] [place]  Subjective  - Doing well ready to go home   Objective (!) 103/56 69 98.3 F (36.8 C) (Oral) 14 98%  Intake/Output Summary (Last 24 hours) at 01/01/17 0927 Last data filed at 01/01/17 0400  Gross per 24 hour  Intake             1140 ml  Output             1225 ml  Net              -85 ml    Doppler DP signals  Incisions healing well, palpable graft fem-fem Lungs non labored breathing Heart RRR  Assessment/Planning: POD #2 Left to right femoral-femoral bypass graft (8 mm dacryon)  Asymptomatic hypotension with stable HGB 8.8 Ambulating, tolerating PO's and voiding Stable for discharge home  Clinton GallantCOLLINS, EMMA [facility] 01/01/2017 9:27 AM --  Laboratory Lab Results:  Recent Labs  12/30/16 2345 12/31/16 1109  WBC 8.2 6.8  HGB 8.5* 8.8*  HCT 26.1* 27.5*  PLT 186 183   BMET  Recent Labs  12/30/16 2345 12/31/16 1109  NA 135 134*  K 4.7 4.3  CL 102 102  CO2 25 25  GLUCOSE 238* 165*  BUN 14 15  CREATININE 1.54* 1.49*  CALCIUM 8.1* 8.1*    COAG Lab Results  Component Value Date   INR 1.07 12/27/2016   INR 1.0 10/07/2016   INR 1.0 09/23/2016   No results found for: PTT  I have independently interviewed patient and agree with PA assessment and plan above.   Angline Schweigert C. Randie Heinzain, MD [facility] [place] Office: [phone] Pager: [phone]

## 2017-01-01 NOTE — Progress Notes (Signed)
Patient complains of a shooting pain that occurs down his right leg every time he stands. Patient says pain lessens after few minutes of standing and then he is able to ambulate. Will continue to monitor.

## 2017-01-02 ENCOUNTER — Encounter: Payer: Medicare Other | Admitting: Surgery

## 2017-01-02 ENCOUNTER — Encounter (HOSPITAL_COMMUNITY): Payer: Self-pay | Admitting: Surgery

## 2017-01-02 LAB — TYPE AND SCREEN
BLOOD PRODUCT EXPIRATION DATE: 201802132359
BLOOD PRODUCT EXPIRATION DATE: 201802132359
UNIT TYPE AND RH: 6200
Unit Type and Rh: 6200

## 2017-01-02 LAB — POCT I-STAT 4, (NA,K, GLUC, HGB,HCT)
GLUCOSE: 140 mg/dL — AB (ref 65–99)
Glucose, Bld: 103 mg/dL — ABNORMAL HIGH (ref 65–99)
HCT: 25 % — ABNORMAL LOW (ref 39.0–52.0)
HEMATOCRIT: 30 % — AB (ref 39.0–52.0)
Hemoglobin: 10.2 g/dL — ABNORMAL LOW (ref 13.0–17.0)
Hemoglobin: 8.5 g/dL — ABNORMAL LOW (ref 13.0–17.0)
POTASSIUM: 4.1 mmol/L (ref 3.5–5.1)
Potassium: 3.7 mmol/L (ref 3.5–5.1)
Sodium: 137 mmol/L (ref 135–145)
Sodium: 140 mmol/L (ref 135–145)

## 2017-01-02 NOTE — Discharge Summary (Signed)
[facility] Discharge Summary   Patient ID:  Derrick [name] MRN: [id number] DOB/AGE: [birth year] 71 y.o.  Admit date: 12/30/2016 Discharge date: 01/01/2017 Date of Surgery: 12/30/2016 Surgeon: Surgeon(s): Serafina Mitchell, MD Angelia Mould, MD  Admission Diagnosis: Peripheral vascular disease with right lower extremity claudication I70.211  Discharge Diagnoses:  Peripheral vascular disease with right lower extremity claudication I70.211  Secondary Diagnoses: Past Medical History:  Diagnosis Date  . Anginal pain ([place])    occ   . Chronic renal insufficiency, stage III (moderate) 2013   CrCl est high 50s (borderline stage II/III pt denies 12/27/16  . COPD (chronic obstructive pulmonary disease) ([place])   . Coronary atherosclerosis of native coronary vessel    S/P NSTEMI 04/11/2009--DEL stent to LAD.  Nuclear stress test 11/2010 NEG, EF normal  . Diabetic peripheral neuropathy ([facility])    Painful; 1 vicodin bid very helpful (pt resistant to alternative tx's)  . Early cataracts, bilateral 04/24/2012   [name] in [place], [place]  . GERD (gastroesophageal reflux disease)   . Heart murmur    patient denies  . Herpes zoster    Two separate outbreaks--one on left axillary/back region, one on right axillary/back region  . Hyperlipidemia   . Hypertension   . Mitral regurgitation 08/2013   Moderate (echo)  . Myocardial infarction 2010  . Peripheral arterial disease ([place])   . S/P arterial stent, 08/22/16 Lt [facility] PTA/Stent  08/23/2016  . Type II diabetes mellitus ([place])     Procedure(s): BYPASS GRAFT LEFT FEMORAL-RIGHT FEMORAL ARTERY  Discharged Condition: good  HPI: Derrick [name] is a 71 y.o. male, who is Referred today for evaluation of right hip pain.  He states that he has pain in his hips with activity and this improves with rest.  He does not have to walk a long distance before he has problems.  Occasionally he will have pain at rest.  He recently underwent  angiography which revealed severe disease within the left iliac system and an occluded right iliac system.  The left side was able to be stented, however the chronic occlusion on the right could not be crossed.  He is referred today for surgical revascularization  The patient suffers from chronic renal insufficiency, stage III.  He has a history of COPD.  He is a former smoker.  He had a heart attack in 2010 and in May 2017 underwent coronary artery bypass grafting.  The patient is a diabetic.  His most recent hemoglobin A1c was 7.4 in May 2017.  He suffers from hypercholesterolemia    Hospital Course:  [name] is a 71 y.o. male is S/P  Procedure(s): BYPASS GRAFT LEFT FEMORAL-RIGHT FEMORAL ARTERY POD#1 Groins soft, palpable graft pulse Doppler B DP signals Active range of motion intact B LE Heart RRR Lungs non labored breathing  Assessment/Planning: POD # 1 Left to right femoral-femoral bypass graft (8 mm dacryon)  Blood loss peri op 7oo cc. Hypotensive received 2 500 cc bolus's since surgery Checking CBC Bmet Keep one more night for observation   POD#2   Doppler DP signals  Incisions healing well, palpable graft fem-fem Lungs non labored breathing Heart RRR  Assessment/Planning: POD #2 Left to right femoral-femoral bypass graft (8 mm dacryon)  Asymptomatic hypotension with stable HGB 8.8 Ambulating, tolerating PO's and voiding Stable for discharge home    Consults:    Significant Diagnostic Studies: CBC Lab Results  Component Value Date   WBC 6.8 12/31/2016  HGB 8.8 (L) 12/31/2016   HCT 27.5 (L) 12/31/2016   MCV 83.3 12/31/2016   PLT 183 12/31/2016    BMET    Component Value Date/Time   NA 134 (L) 12/31/2016 1109   K 4.3 12/31/2016 1109   CL 102 12/31/2016 1109   CO2 25 12/31/2016 1109   GLUCOSE 165 (H) 12/31/2016 1109   BUN 15 12/31/2016 1109   CREATININE 1.49 (H) 12/31/2016 1109   CREATININE 1.51 (H) 10/07/2016 1028   CALCIUM 8.1  (L) 12/31/2016 1109   GFRNONAA 45 (L) 12/31/2016 1109   GFRAA 53 (L) 12/31/2016 1109   COAG Lab Results  Component Value Date   INR 1.07 12/27/2016   INR 1.0 10/07/2016   INR 1.0 09/23/2016     Disposition:  Discharge to :Home Discharge Instructions    Call MD for:  redness, tenderness, or signs of infection (pain, swelling, bleeding, redness, odor or green/yellow discharge around incision site)    Complete by:  As directed    Call MD for:  severe or increased pain, loss or decreased feeling  in affected limb(s)    Complete by:  As directed    Call MD for:  temperature >100.5    Complete by:  As directed    Discharge instructions    Complete by:  As directed    You may shower PRN, dry incisions well and keep clean and dry   Driving Restrictions    Complete by:  As directed    No driving for 1 week   Increase activity slowly    Complete by:  As directed    Walk with assistance use walker or cane as needed   Lifting restrictions    Complete by:  As directed    No heavy  lifting for 4 weeks   Resume previous diet    Complete by:  As directed      Allergies as of 01/01/2017      Reactions   Atorvastatin Other (See Comments)   MYALGIAS MUSCLE CRAMPS   Lovastatin Other (See Comments)   MYALGIAS MUSCLE CRAMPS   Pravastatin Other (See Comments)   MYALGIAS MUSCLE CRAMPS   Simvastatin Other (See Comments)   MYALGIAS MUSCLE CRAMPS      Medication List    TAKE these medications   aspirin 81 MG EC tablet Take 1 tablet (81 mg total) by mouth daily.   clopidogrel 75 MG tablet Commonly known as:  PLAVIX Take 1 tablet (75 mg total) by mouth daily with breakfast.   glucose blood test strip Commonly known as:  ONE TOUCH ULTRA TEST USE ONE STRIP TO CHECK GLUCOSE 4 TIMES DAILY AS NEEDED   HYDROcodone-acetaminophen 5-325 MG tablet Commonly known as:  NORCO/VICODIN Take 1 tablet by mouth every 6 (six) hours as needed for moderate pain. What changed:  Another medication  with the same name was added. Make sure you understand how and when to take each.   HYDROcodone-acetaminophen 5-325 MG tablet Commonly known as:  NORCO/VICODIN Take 1-2 tablets by mouth every 4 (four) hours as needed for moderate pain. What changed:  You were already taking a medication with the same name, and this prescription was added. Make sure you understand how and when to take each.   insulin pump Soln Inject 1 each into the skin continuous. Meal settings: 2.7 small, 5.5 medium, 8.5 large  Humalog   lisinopril 2.5 MG tablet Commonly known as:  PRINIVIL,ZESTRIL Take 2.5 mg by mouth daily.   metoprolol  tartrate 25 MG tablet Commonly known as:  LOPRESSOR Take 0.5 tablets (12.5 mg total) by mouth 2 (two) times daily.   nitroGLYCERIN 0.4 MG SL tablet Commonly known as:  NITROSTAT Place 1 tablet (0.4 mg total) under the tongue every 5 (five) minutes as needed. What changed:  reasons to take this   [place] KIT w/Device Kit Patient must check sugar TID   ONETOUCH DELICA LANCETS 80I Misc USE ONE  4 TIMES DAILY AS NEEDED   pantoprazole 40 MG tablet Commonly known as:  PROTONIX Take 1 tablet (40 mg total) by mouth daily.   research study medication Take 1 each by mouth daily. Study drug for cholesterol from Dr. Deland Pretty      Verbal and written Discharge instructions given to the patient. Wound care per Discharge AVS Follow-up Information    Annamarie Major, MD Follow up in 2 week(s).   Specialties:  Vascular Surgery, Cardiology Why:  office will call Contact information: [place] [address] [phone]           Signed: Laurence Slate [facility] 01/02/2017, 2:08 PM - For VQI Registry use --- Instructions: Press F2 to tab through selections.  Delete question if not applicable.   Post-op:  Wound infection: No  Graft infection: No  Transfusion: No  If yes, 0 units given New Arrhythmia: No Ipsilateral amputation: [x ] no, '[ ]'$  Minor,  '[ ]'$  BKA, '[ ]'$  AKA Discharge patency: [x ] Primary, '[ ]'$  Primary assisted, '[ ]'$  Secondary, '[ ]'$  Occluded Patency judged by: [x ] Dopper only, '[ ]'$  Palpable graft pulse, '[ ]'$  Palpable distal pulse, '[ ]'$  ABI inc. > 0.15, '[ ]'$  Duplex  D/C Ambulatory Status: Ambulatory  Complications: MI: [x ] No, '[ ]'$  Troponin only, '[ ]'$  EKG or Clinical CHF: No Resp failure: [x ] none, '[ ]'$  Pneumonia, '[ ]'$  Ventilator Chg in renal function: [x ] none, '[ ]'$  Inc. Cr > 0.5, '[ ]'$  Temp. Dialysis, '[ ]'$  Permanent dialysis Stroke: [x ] None, '[ ]'$  Minor, '[ ]'$  Major Return to OR: No  Reason for return to OR: '[ ]'$  Bleeding, '[ ]'$  Infection, '[ ]'$  Thrombosis, '[ ]'$  Revision  Discharge medications: Statin use:  No  for medical reason   ASA use:  Yes Plavix use:  No  for medical reason   Beta blocker use: Yes Coumadin use: No  for medical reason

## 2017-01-12 ENCOUNTER — Encounter (HOSPITAL_COMMUNITY): Payer: Self-pay | Admitting: *Deleted

## 2017-01-19 DIAGNOSIS — E1140 Type 2 diabetes mellitus with diabetic neuropathy, unspecified: Secondary | ICD-10-CM | POA: Diagnosis not present

## 2017-01-20 DIAGNOSIS — Z79899 Other long term (current) drug therapy: Secondary | ICD-10-CM | POA: Diagnosis not present

## 2017-01-20 DIAGNOSIS — Z79891 Long term (current) use of opiate analgesic: Secondary | ICD-10-CM | POA: Diagnosis not present

## 2017-01-20 DIAGNOSIS — G894 Chronic pain syndrome: Secondary | ICD-10-CM | POA: Diagnosis not present

## 2017-01-20 DIAGNOSIS — G629 Polyneuropathy, unspecified: Secondary | ICD-10-CM | POA: Diagnosis not present

## 2017-01-20 DIAGNOSIS — E1142 Type 2 diabetes mellitus with diabetic polyneuropathy: Secondary | ICD-10-CM | POA: Diagnosis not present

## 2017-01-24 ENCOUNTER — Encounter: Payer: Self-pay | Admitting: Surgery

## 2017-01-27 DIAGNOSIS — E119 Type 2 diabetes mellitus without complications: Secondary | ICD-10-CM | POA: Diagnosis not present

## 2017-01-30 ENCOUNTER — Encounter: Payer: Self-pay | Admitting: Surgery

## 2017-01-30 ENCOUNTER — Ambulatory Visit (INDEPENDENT_AMBULATORY_CARE_PROVIDER_SITE_OTHER): Payer: Self-pay | Admitting: Surgery

## 2017-01-30 ENCOUNTER — Ambulatory Visit (HOSPITAL_COMMUNITY)
Admission: RE | Admit: 2017-01-30 | Discharge: 2017-01-30 | Disposition: A | Discharge status: Home / Self Care | Payer: Medicare Other | Source: Ambulatory Visit | Attending: Surgery | Admitting: Surgery

## 2017-01-30 VITALS — BP 117/67 | HR 76 | Temp 97.6°F | Resp 16 | Ht 66.0 in | Wt 140.0 lb

## 2017-01-30 DIAGNOSIS — I739 Peripheral vascular disease, unspecified: Secondary | ICD-10-CM

## 2017-01-30 DIAGNOSIS — I70211 Atherosclerosis of native arteries of extremities with intermittent claudication, right leg: Secondary | ICD-10-CM

## 2017-01-30 DIAGNOSIS — Z48812 Encounter for surgical aftercare following surgery on the circulatory system: Secondary | ICD-10-CM | POA: Insufficient documentation

## 2017-01-30 NOTE — Progress Notes (Signed)
Patient name: Derrick [name] MRN: [id number] DOB: Apr 14, 1946 Sex: male  REASON FOR VISIT:    post-op  HISTORY OF PRESENT ILLNESS:   Derrick [name] is a 71 y.o. male returns today for follow-up.  He was referred by Dr. Gwenlyn Found for evaluation of right hip pain.  He had an arteriogram that revealed an occluded right iliac artery that could not be recanalized as well as stenosis in the left iliac system that was stented.  On 12/30/2016 he underwent a left to right femoral-femoral bypass graft with 8 mm dacryon.  He had extensive scar tissue and inflammation and both groins.  The artery was encased with a thickened rind..  His postoperative course was uncomplicated.  He states that he is walking significantly better.  He denies drainage from his incisions.  CURRENT MEDICATIONS:    Current Outpatient Prescriptions  Medication Sig Dispense Refill  . aspirin EC 81 MG EC tablet Take 1 tablet (81 mg total) by mouth daily.    . Blood Glucose Monitoring Suppl (ONE TOUCH ULTRA SYSTEM KIT) W/DEVICE KIT Patient must check sugar TID 1 each 0  . clopidogrel (PLAVIX) 75 MG tablet Take 1 tablet (75 mg total) by mouth daily with breakfast. 30 tablet 11  . glucose blood (ONE TOUCH ULTRA TEST) test strip USE ONE STRIP TO CHECK GLUCOSE 4 TIMES DAILY AS NEEDED 100 each 12  . HYDROcodone-acetaminophen (NORCO/VICODIN) 5-325 MG tablet Take 1 tablet by mouth every 6 (six) hours as needed for moderate pain. 40 tablet 0  . HYDROcodone-acetaminophen (NORCO/VICODIN) 5-325 MG tablet Take 1-2 tablets by mouth every 4 (four) hours as needed for moderate pain. 20 tablet 0  . Insulin Human (INSULIN PUMP) SOLN Inject 1 each into the skin continuous. Meal settings: 2.7 small, 5.5 medium, 8.5 large  Humalog    . lisinopril (PRINIVIL,ZESTRIL) 2.5 MG tablet Take 2.5 mg by mouth daily.    . metoprolol tartrate (LOPRESSOR) 25 MG tablet Take 0.5 tablets (12.5 mg total) by mouth 2 (two) times daily.  90 tablet 1  . nitroGLYCERIN (NITROSTAT) 0.4 MG SL tablet Place 1 tablet (0.4 mg total) under the tongue every 5 (five) minutes as needed. (Patient taking differently: Place 0.4 mg under the tongue every 5 (five) minutes as needed for chest pain. ) 20 tablet 1  . ONETOUCH DELICA LANCETS 67M MISC USE ONE  4 TIMES DAILY AS NEEDED 100 each 0  . pantoprazole (PROTONIX) 40 MG tablet Take 1 tablet (40 mg total) by mouth daily. 30 tablet 6  . research study medication Take 1 each by mouth daily. Study drug for cholesterol from Dr. Deland Pretty     No current facility-administered medications for this visit.     REVIEW OF SYSTEMS:   _0  denotes positive finding, _1  denotes negative finding Cardiac  Comments:  Chest pain or chest pressure:    Shortness of breath upon exertion:    Short of breath when lying flat:    Irregular heart rhythm:    Constitutional    Fever or chills:      PHYSICAL EXAM:   Vitals:   01/30/17 1252  BP: 117/67  Pulse: 76  Resp: 16  Temp: 97.6 F (36.4 C)  TempSrc: Oral  SpO2: 100%  Weight: 140 lb (63.5 kg)  Height: _2  (1.676 m)    GENERAL: The patient is a well-nourished male, in no acute distress. The vital signs are documented above. CARDIOVASCULAR: There is a regular rate and rhythm. PULMONARY: Non-labored  respirations Bilateral longitudinal femoral incisions are healing appropriately without erythema or drainage.  There is a palpable pulse within the femoral-femoral graft.  STUDIES:   ABIs were done in the office today as they were not done in the hospital.  The right measures 0.73.  The left measures 0.78.    MEDICAL ISSUES:   Status post left-to-right femoral-femoral bypass graft.  The patient is doing well.  His incisions have healed nicely.  There is a palpable pulse within the graft.  His symptoms have improved   I discussed with the patient that as always he continues to improve, I will have him follow-up with Dr. Gwenlyn Found for his bypass graft  and left iliac stent surveillance.  He will contact me on an as-needed basis.  Annamarie Major, MD [facility] [phone] Pager [phone]

## 2017-02-10 ENCOUNTER — Ambulatory Visit: Payer: Medicare Other

## 2017-02-17 ENCOUNTER — Ambulatory Visit
Admission: RE | Admit: 2017-02-17 | Discharge: 2017-02-17 | Disposition: A | Discharge status: Home / Self Care | Payer: Medicare Other | Source: Ambulatory Visit | Attending: Acute Care | Admitting: Acute Care

## 2017-02-17 DIAGNOSIS — Z87891 Personal history of nicotine dependence: Secondary | ICD-10-CM | POA: Diagnosis not present

## 2017-02-22 ENCOUNTER — Other Ambulatory Visit: Payer: Self-pay | Admitting: Acute Care

## 2017-02-22 DIAGNOSIS — Z87891 Personal history of nicotine dependence: Secondary | ICD-10-CM

## 2017-03-02 DIAGNOSIS — G629 Polyneuropathy, unspecified: Secondary | ICD-10-CM | POA: Diagnosis not present

## 2017-03-02 DIAGNOSIS — G894 Chronic pain syndrome: Secondary | ICD-10-CM | POA: Diagnosis not present

## 2017-03-02 DIAGNOSIS — E1142 Type 2 diabetes mellitus with diabetic polyneuropathy: Secondary | ICD-10-CM | POA: Diagnosis not present

## 2017-03-02 DIAGNOSIS — Z79899 Other long term (current) drug therapy: Secondary | ICD-10-CM | POA: Diagnosis not present

## 2017-03-02 DIAGNOSIS — Z79891 Long term (current) use of opiate analgesic: Secondary | ICD-10-CM | POA: Diagnosis not present

## 2017-03-17 DIAGNOSIS — Z0000 Encounter for general adult medical examination without abnormal findings: Secondary | ICD-10-CM | POA: Diagnosis not present

## 2017-03-17 DIAGNOSIS — I10 Essential (primary) hypertension: Secondary | ICD-10-CM | POA: Diagnosis not present

## 2017-03-17 DIAGNOSIS — Z1212 Encounter for screening for malignant neoplasm of rectum: Secondary | ICD-10-CM | POA: Diagnosis not present

## 2017-03-31 DIAGNOSIS — G894 Chronic pain syndrome: Secondary | ICD-10-CM | POA: Diagnosis not present

## 2017-03-31 DIAGNOSIS — Z79891 Long term (current) use of opiate analgesic: Secondary | ICD-10-CM | POA: Diagnosis not present

## 2017-03-31 DIAGNOSIS — Z79899 Other long term (current) drug therapy: Secondary | ICD-10-CM | POA: Diagnosis not present

## 2017-03-31 DIAGNOSIS — G629 Polyneuropathy, unspecified: Secondary | ICD-10-CM | POA: Diagnosis not present

## 2017-03-31 DIAGNOSIS — E1142 Type 2 diabetes mellitus with diabetic polyneuropathy: Secondary | ICD-10-CM | POA: Diagnosis not present

## 2017-04-07 ENCOUNTER — Ambulatory Visit: Payer: Medicare Other | Admitting: Cardiovascular Disease

## 2017-04-17 ENCOUNTER — Other Ambulatory Visit: Payer: Self-pay | Admitting: Cardiology

## 2017-04-17 NOTE — Telephone Encounter (Signed)
Rx(s) sent to pharmacy electronically.  

## 2017-04-18 ENCOUNTER — Encounter: Payer: Self-pay | Admitting: Cardiovascular Disease

## 2017-04-18 ENCOUNTER — Ambulatory Visit (INDEPENDENT_AMBULATORY_CARE_PROVIDER_SITE_OTHER): Payer: Medicare Other | Admitting: Cardiovascular Disease

## 2017-04-18 VITALS — BP 94/60 | HR 82 | Ht 66.0 in | Wt 143.8 lb

## 2017-04-18 DIAGNOSIS — I779 Disorder of arteries and arterioles, unspecified: Secondary | ICD-10-CM | POA: Diagnosis not present

## 2017-04-18 DIAGNOSIS — I739 Peripheral vascular disease, unspecified: Secondary | ICD-10-CM | POA: Diagnosis not present

## 2017-04-18 DIAGNOSIS — Z951 Presence of aortocoronary bypass graft: Secondary | ICD-10-CM

## 2017-04-18 NOTE — Assessment & Plan Note (Signed)
History of peripheral arterial disease status post left common iliac artery stenting by myself 08/22/16. He does have an occluded left SFA with two-vessel runoff. I attempted to recanalize his right iliac artery unsuccessfully 10/13/16 and ultimately sent him to Dr. Myra GianottiBrabham for left to right femorofemoral cross over grafting which was performed 12/30/16. This was uncomplicated. He sought Dr. Myra GianottiBrabham back 01/30/17 at which time he was healing nicely and was released back to my care for surveillance. He says that his claudication is somewhat improved. His inguinal incisions well healed. He does have a loud bruit in his left groin. We will recheck lower extremity Doppler studies in 6 months.

## 2017-04-18 NOTE — Assessment & Plan Note (Signed)
History of carotid artery disease by duplex ultrasound 12/09/16 with moderate right and mild left ICA stenosis. This will be repeated in one year.

## 2017-04-18 NOTE — Assessment & Plan Note (Signed)
History of hyperlipidemia, statin intolerant, on a study drug followed by Dr. Renne CriglerPharr

## 2017-04-18 NOTE — Assessment & Plan Note (Signed)
History of coronary artery disease status post cardiac catheterization by myself 03/28/16 revealing a patent proximal LAD stent, 95% ostial circumflex, 70% mid AV groove circumflex and 30% proximal mid dominant RCA stenosis. He ultimately underwent coronary artery bypass grafting by Dr. Laneta SimmersBartle on 04/22/16. The LIMA graft to OM1, vein to OM 2, free RIMA to the RCA. His postoperative course was uncomplicated. He denies chest pain or shortness of breath.

## 2017-04-18 NOTE — Progress Notes (Signed)
04/18/2017 Derrick [name]   [birth year]  [id number]  Primary Physician Deland Pretty, MD Primary Cardiologist: Lorretta Harp MD Renae Gloss  HPI:  Derrick [name] is a very pleasant 71 year old thin and fit-appearing married Caucasian male father of 62, grandfather to 3 grandchildren who I last saw in the office 11/08/16.[name] He was referred by Dr. Shelia Media for cardiovascular evaluation because of a prior history of coronary stenting. Factors include treated diabetes and hyperlipidemia intolerant to statin therapy. He smoked 45 pack years and quit at the time of his myocardial infarction in 2010. He does have a family history of heart disease with a father and brother both of whom had coronary artery bypass grafting. He'll myocardial infarction back in 2010 and had stenting performed by Dr. Olevia Perches. He does get occasional atypical chest pain every other month. There also is a question of moderate mitral regurgitation. A 2-D echocardiogram showed normal LV function with mild MR. The Myoview stress test which was read as intermediate risk with inferior and lateral ischemia. Based on this, the patient was referred for cardiac catheterization to define his anatomy. It should also be noted the patient has been having right hip pain thought to be arthritic for many years. I performed cardiac catheterization on him 03/28/16 revealing a patent proximal LAD stent, 95% ostial circumflex, 70% mid AV groove circumflex a long 70% proximal and mid dominant RCA stenosis. I thought his anatomy was most suited for coronary artery bypass grafting which was performed by Dr. Cyndia Bent on 04/22/16. The LIMA graft to his OM1, vein to OM 2 and a free RIMA to the RCA. His postoperative course was uncomplicated. He is back to work now denying chest pain or shortness of breath. His major issue now is bilateral hip and calf claudication which is lifestyle limiting. He presents today for angiography and potential right common iliac  artery PTA and stenting for chronic total occlusion. He had left common iliac artery stenting 08/22/16 successfully improving his left lower extremity . I attempted to percutaneously recanalized his right common iliac chronic total occlusion of 10/13/16 unsuccessfully. He continues to have lifestyle limiting right hip claudication. We talked about options and the patient desires to proceed with surgical revascularization. I'm referred him to Dr. Trula Slade for consideration of left to right femorofemoral crossover grafting which was performed on 12/30/16 successfully. He had a postop visit with Dr. Trula Slade one month later and was released back to my care after that. He says his claudication is somewhat improved.    Current Outpatient Prescriptions  Medication Sig Dispense Refill  . aspirin EC 81 MG EC tablet Take 1 tablet (81 mg total) by mouth daily.    . Blood Glucose Monitoring Suppl (ONE TOUCH ULTRA SYSTEM KIT) W/DEVICE KIT Patient must check sugar TID 1 each 0  . clopidogrel (PLAVIX) 75 MG tablet Take 1 tablet (75 mg total) by mouth daily with breakfast. 30 tablet 11  . glucose blood (ONE TOUCH ULTRA TEST) test strip USE ONE STRIP TO CHECK GLUCOSE 4 TIMES DAILY AS NEEDED 100 each 12  . HYDROcodone-acetaminophen (NORCO/VICODIN) 5-325 MG tablet Take 1 tablet by mouth every 6 (six) hours as needed for moderate pain. 40 tablet 0  . HYDROcodone-acetaminophen (NORCO/VICODIN) 5-325 MG tablet Take 1-2 tablets by mouth every 4 (four) hours as needed for moderate pain. 20 tablet 0  . Insulin Human (INSULIN PUMP) SOLN Inject 1 each into the skin continuous. Meal settings: 2.7 small, 5.5 medium, 8.5 large  Humalog    . lisinopril (PRINIVIL,ZESTRIL) 2.5 MG tablet Take 2.5 mg by mouth daily.    . metoprolol tartrate (LOPRESSOR) 25 MG tablet Take 0.5 tablets (12.5 mg total) by mouth 2 (two) times daily. 90 tablet 1  . nitroGLYCERIN (NITROSTAT) 0.4 MG SL tablet Place 1 tablet (0.4 mg total) under the tongue every 5  (five) minutes as needed. (Patient taking differently: Place 0.4 mg under the tongue every 5 (five) minutes as needed for chest pain. ) 20 tablet 1  . ONETOUCH DELICA LANCETS 86L MISC USE ONE  4 TIMES DAILY AS NEEDED 100 each 0  . pantoprazole (PROTONIX) 40 MG tablet TAKE ONE TABLET BY MOUTH ONCE DAILY 90 tablet 2  . research study medication Take 1 each by mouth daily. Study drug for cholesterol from Dr. Deland Pretty     No current facility-administered medications for this visit.     Allergies  Allergen Reactions  . Atorvastatin Other (See Comments)    MYALGIAS MUSCLE CRAMPS   . Lovastatin Other (See Comments)    MYALGIAS MUSCLE CRAMPS  . Pravastatin Other (See Comments)    MYALGIAS MUSCLE CRAMPS  . Simvastatin Other (See Comments)    MYALGIAS MUSCLE CRAMPS    Social History   Social History  . Marital status: Married    Spouse name: N/A  . Number of children: N/A  . Years of education: N/A   Occupational History  . Not on file.   Social History Main Topics  . Smoking status: Former Smoker    Packs/day: 1.00    Years: 50.00    Types: Cigarettes    Quit date: 01/06/2009  . Smokeless tobacco: Never Used  . Alcohol use No     Comment: "drank some in high school"  . Drug use: No  . Sexual activity: No   Other Topics Concern  . Not on file   Social History Narrative   Married, works part time at [employer] in [place], [place].   Former smoker: quit 01/2011.  No alcohol or drugs.   No formal exercise.     Review of Systems: General: negative for chills, fever, night sweats or weight changes.  Cardiovascular: negative for chest pain, dyspnea on exertion, edema, orthopnea, palpitations, paroxysmal nocturnal dyspnea or shortness of breath Dermatological: negative for rash Respiratory: negative for cough or wheezing Urologic: negative for hematuria Abdominal: negative for nausea, vomiting, diarrhea, bright red blood per rectum, melena, or  hematemesis Neurologic: negative for visual changes, syncope, or dizziness All other systems reviewed and are otherwise negative except as noted above.    Blood pressure 94/60, pulse 82, height $RemoveBe'5\' 6"'AClMxIGin$  (1.676 m), weight 143 lb 12.8 oz (65.2 kg).  General appearance: alert and no distress Neck: no adenopathy, no JVD, supple, symmetrical, trachea midline, thyroid not enlarged, symmetric, no tenderness/mass/nodules and Soft right carotid bruit Lungs: clear to auscultation bilaterally Heart: regular rate and rhythm, S1, S2 normal, no murmur, click, rub or gallop Extremities: Healed bilateral femoral incisions with a soft right and allowed left femoral bruit.  EKG sinus rhythm at 82 with a ST or T-wave changes. I personally reviewed this EKG  ASSESSMENT AND PLAN:   Statin intolerance History of hyperlipidemia, statin intolerant, on a study drug followed by Dr. Shelia Media  S/P CABG x 3 History of coronary artery disease status post cardiac catheterization by myself 03/28/16 revealing a patent proximal LAD stent, 95% ostial circumflex, 70% mid AV groove circumflex and 30% proximal mid dominant RCA stenosis. He  ultimately underwent coronary artery bypass grafting by Dr. Cyndia Bent on 04/22/16. The LIMA graft to OM1, vein to OM 2, free RIMA to the RCA. His postoperative course was uncomplicated. He denies chest pain or shortness of breath.  PVD (peripheral vascular disease) ([place]) History of peripheral arterial disease status post left common iliac artery stenting by myself 08/22/16. He does have an occluded left SFA with two-vessel runoff. I attempted to recanalize his right iliac artery unsuccessfully 10/13/16 and ultimately sent him to Dr. Trula Slade for left to right femorofemoral cross over grafting which was performed 12/30/16. This was uncomplicated. He sought Dr. Trula Slade back 01/30/17 at which time he was healing nicely and was released back to my care for surveillance. He says that his claudication is somewhat  improved. His inguinal incisions well healed. He does have a loud bruit in his left groin. We will recheck lower extremity Doppler studies in 6 months.  Right-sided carotid artery disease ([place]) History of carotid artery disease by duplex ultrasound 12/09/16 with moderate right and mild left ICA stenosis. This will be repeated in one year.      Lorretta Harp MD FACP,FACC,FAHA, [facility] 04/18/2017 2:31 PM

## 2017-04-18 NOTE — Patient Instructions (Signed)
Medication Instructions: Your physician recommends that you continue on your current medications as directed. Please refer to the Current Medication list given to you today.   Testing/Procedures: Your physician has requested that you have a lower extremity arterial duplex. During this test, ultrasound is used to evaluate arterial blood flow in the legs. Allow one hour for this exam. There are no restrictions or special instructions.  Your physician has requested that you have an ankle brachial index (ABI). During this test an ultrasound and blood pressure cuff are used to evaluate the arteries that supply the arms and legs with blood. Allow thirty minutes for this exam. There are no restrictions or special instructions. (In 6 months--prior to appt with Dr. Berry)  Follow-Up: Your physician wants you to follow-up in: 6 months with Dr. Berry. You will receive a reminder letter in the mail two months in advance. If you don't receive a letter, please call our office to schedule the follow-up appointment.  If you need a refill on your cardiac medications before your next appointment, please call your pharmacy.  

## 2017-04-25 DIAGNOSIS — E1140 Type 2 diabetes mellitus with diabetic neuropathy, unspecified: Secondary | ICD-10-CM | POA: Diagnosis not present

## 2017-04-25 DIAGNOSIS — E119 Type 2 diabetes mellitus without complications: Secondary | ICD-10-CM | POA: Diagnosis not present

## 2017-04-25 DIAGNOSIS — I10 Essential (primary) hypertension: Secondary | ICD-10-CM | POA: Diagnosis not present

## 2017-04-25 DIAGNOSIS — E7800 Pure hypercholesterolemia, unspecified: Secondary | ICD-10-CM | POA: Diagnosis not present

## 2017-04-25 DIAGNOSIS — I2510 Atherosclerotic heart disease of native coronary artery without angina pectoris: Secondary | ICD-10-CM | POA: Diagnosis not present

## 2017-04-25 DIAGNOSIS — Z125 Encounter for screening for malignant neoplasm of prostate: Secondary | ICD-10-CM | POA: Diagnosis not present

## 2017-04-25 DIAGNOSIS — D649 Anemia, unspecified: Secondary | ICD-10-CM | POA: Diagnosis not present

## 2017-04-25 NOTE — Addendum Note (Signed)
Addended by: Chana BodeGREEN, Tanay Massiah L on: 04/25/2017 03:45 PM   Modules accepted: Orders

## 2017-05-04 DIAGNOSIS — E1140 Type 2 diabetes mellitus with diabetic neuropathy, unspecified: Secondary | ICD-10-CM | POA: Diagnosis not present

## 2017-05-23 DIAGNOSIS — D649 Anemia, unspecified: Secondary | ICD-10-CM | POA: Diagnosis not present

## 2017-06-13 DIAGNOSIS — E119 Type 2 diabetes mellitus without complications: Secondary | ICD-10-CM | POA: Diagnosis not present

## 2017-06-23 DIAGNOSIS — E1142 Type 2 diabetes mellitus with diabetic polyneuropathy: Secondary | ICD-10-CM | POA: Diagnosis not present

## 2017-06-23 DIAGNOSIS — Z79899 Other long term (current) drug therapy: Secondary | ICD-10-CM | POA: Diagnosis not present

## 2017-06-23 DIAGNOSIS — G629 Polyneuropathy, unspecified: Secondary | ICD-10-CM | POA: Diagnosis not present

## 2017-06-23 DIAGNOSIS — Z79891 Long term (current) use of opiate analgesic: Secondary | ICD-10-CM | POA: Diagnosis not present

## 2017-06-23 DIAGNOSIS — D649 Anemia, unspecified: Secondary | ICD-10-CM | POA: Diagnosis not present

## 2017-06-23 DIAGNOSIS — G894 Chronic pain syndrome: Secondary | ICD-10-CM | POA: Diagnosis not present

## 2017-07-04 DIAGNOSIS — R200 Anesthesia of skin: Secondary | ICD-10-CM | POA: Diagnosis not present

## 2017-07-26 DIAGNOSIS — E1140 Type 2 diabetes mellitus with diabetic neuropathy, unspecified: Secondary | ICD-10-CM | POA: Diagnosis not present

## 2017-08-14 DIAGNOSIS — G5601 Carpal tunnel syndrome, right upper limb: Secondary | ICD-10-CM | POA: Diagnosis not present

## 2017-08-14 DIAGNOSIS — G5602 Carpal tunnel syndrome, left upper limb: Secondary | ICD-10-CM | POA: Diagnosis not present

## 2017-08-25 ENCOUNTER — Inpatient Hospital Stay (HOSPITAL_COMMUNITY): Admission: RE | Admit: 2017-08-25 | Payer: Medicare Other | Source: Ambulatory Visit

## 2017-08-25 ENCOUNTER — Other Ambulatory Visit: Payer: Self-pay | Admitting: Cardiology

## 2017-08-28 DIAGNOSIS — I10 Essential (primary) hypertension: Secondary | ICD-10-CM | POA: Diagnosis not present

## 2017-08-28 DIAGNOSIS — E1140 Type 2 diabetes mellitus with diabetic neuropathy, unspecified: Secondary | ICD-10-CM | POA: Diagnosis not present

## 2017-09-08 ENCOUNTER — Ambulatory Visit (HOSPITAL_COMMUNITY)
Admission: RE | Admit: 2017-09-08 | Discharge: 2017-09-08 | Disposition: A | Discharge status: Home / Self Care | Payer: Medicare Other | Source: Ambulatory Visit | Attending: Cardiology | Admitting: Cardiology

## 2017-09-08 ENCOUNTER — Other Ambulatory Visit: Payer: Self-pay | Admitting: Cardiovascular Disease

## 2017-09-08 ENCOUNTER — Ambulatory Visit (HOSPITAL_BASED_OUTPATIENT_CLINIC_OR_DEPARTMENT_OTHER)
Admission: RE | Admit: 2017-09-08 | Discharge: 2017-09-08 | Disposition: A | Discharge status: Home / Self Care | Payer: Medicare Other | Source: Ambulatory Visit | Attending: Cardiology | Admitting: Cardiology

## 2017-09-08 DIAGNOSIS — I739 Peripheral vascular disease, unspecified: Secondary | ICD-10-CM | POA: Diagnosis not present

## 2017-09-08 DIAGNOSIS — I708 Atherosclerosis of other arteries: Secondary | ICD-10-CM | POA: Diagnosis not present

## 2017-09-08 DIAGNOSIS — I7789 Other specified disorders of arteries and arterioles: Secondary | ICD-10-CM | POA: Diagnosis not present

## 2017-09-15 ENCOUNTER — Telehealth: Payer: Self-pay | Admitting: Cardiovascular Disease

## 2017-09-15 DIAGNOSIS — G894 Chronic pain syndrome: Secondary | ICD-10-CM | POA: Diagnosis not present

## 2017-09-15 DIAGNOSIS — E1142 Type 2 diabetes mellitus with diabetic polyneuropathy: Secondary | ICD-10-CM | POA: Diagnosis not present

## 2017-09-15 DIAGNOSIS — Z79899 Other long term (current) drug therapy: Secondary | ICD-10-CM | POA: Diagnosis not present

## 2017-09-15 DIAGNOSIS — E1140 Type 2 diabetes mellitus with diabetic neuropathy, unspecified: Secondary | ICD-10-CM | POA: Diagnosis not present

## 2017-09-15 DIAGNOSIS — G629 Polyneuropathy, unspecified: Secondary | ICD-10-CM | POA: Diagnosis not present

## 2017-09-15 DIAGNOSIS — E7800 Pure hypercholesterolemia, unspecified: Secondary | ICD-10-CM | POA: Diagnosis not present

## 2017-09-15 DIAGNOSIS — Z79891 Long term (current) use of opiate analgesic: Secondary | ICD-10-CM | POA: Diagnosis not present

## 2017-09-15 NOTE — Telephone Encounter (Signed)
    [facility] Pre-operative Risk Assessment    Request for surgical clearance:  1. What type of surgery is being performed? Right Wrist: right hand carpal tunnel release   2. When is this surgery scheduled? 09-21-17   3. Are there any medications that need to be held prior to surgery and how long?pt currently on ASA 64m and Plavix 7107m  4. Pt last seen by Dr. BeGwenlyn Foundn 04/18/17. States pt had stents in 2010 after MI, CABG in 2017, and femorofemoral crossover grafting in Jan 2018 by Dr. BrTrula Slade 5. [facility] 6. What is your office phone and fax number?   Phone: [phone]: [phone] 7. Anesthesia type (None, local, MAC, general) ? Not specified   TaTherisa Doyne0/11/2017, 3:20 PM  _________________________________________________________________   (provider comments below)

## 2017-09-18 NOTE — Telephone Encounter (Signed)
Spoke with patient and informed him that he would need to be seen at his 09/29/17 OV before he could be cleared for his procedure. Patient informed me that he did not wish to put his surgery off any longer as his hand and wrist are causing him to much pain and he has already been off his ASA and Plavix since Saturday. I told him those were the recommendations per Chelsea Aus, PA-C but that Derrick [name] and see what Dr. Hazle Coca recommendations are and that once we had both we would give him a call back. He is agreeable to plan.

## 2017-09-18 NOTE — Telephone Encounter (Signed)
    Chart reviewed as part of pre-operative protocol coverage. Because of Stylianos Stradling Pinder's past medical history and time since last visit 04/18/17, he/she will require a follow-up visit in order to better assess preoperative cardiovascular risk. Seems patient has ongoing issue with PAD. Prefers appointment with APP while Dr. Allyson Sabal is in clinic.   Octa, PA  09/18/2017, 2:44 PM

## 2017-09-18 NOTE — Telephone Encounter (Signed)
Per Chelsea Aus, PA-C who spoke with Dr. Allyson Sabal pt is cleared for procedure 09/21/17 and is ok to have help ASA and Plavix. Pt is aware and agreeable to plan.

## 2017-09-18 NOTE — Telephone Encounter (Signed)
Patient is already holding ASA and plavix. Spoke with Dr. Allyson Sabal who has cleared patient for surgery. Last PCI 03/2016.  Left to right femorofemoral crossover grafting by Dr. Myra Gianotti 12/30/16. Ok to hold antiplatelet therapy for one week. Will route to [facility]oped[facility].   Kelly, PA 09/18/2017, 3:52 PM

## 2017-09-21 DIAGNOSIS — G5601 Carpal tunnel syndrome, right upper limb: Secondary | ICD-10-CM | POA: Diagnosis not present

## 2017-09-27 DIAGNOSIS — E119 Type 2 diabetes mellitus without complications: Secondary | ICD-10-CM | POA: Diagnosis not present

## 2017-09-28 DIAGNOSIS — E119 Type 2 diabetes mellitus without complications: Secondary | ICD-10-CM | POA: Diagnosis not present

## 2017-09-29 ENCOUNTER — Encounter: Payer: Self-pay | Admitting: Cardiovascular Disease

## 2017-09-29 ENCOUNTER — Ambulatory Visit (INDEPENDENT_AMBULATORY_CARE_PROVIDER_SITE_OTHER): Payer: Medicare Other | Admitting: Cardiovascular Disease

## 2017-09-29 VITALS — BP 129/72 | HR 66 | Ht 66.0 in | Wt 148.0 lb

## 2017-09-29 DIAGNOSIS — Z951 Presence of aortocoronary bypass graft: Secondary | ICD-10-CM

## 2017-09-29 DIAGNOSIS — I739 Peripheral vascular disease, unspecified: Secondary | ICD-10-CM

## 2017-09-29 DIAGNOSIS — E1159 Type 2 diabetes mellitus with other circulatory complications: Secondary | ICD-10-CM | POA: Diagnosis not present

## 2017-09-29 DIAGNOSIS — I10 Essential (primary) hypertension: Secondary | ICD-10-CM | POA: Diagnosis not present

## 2017-09-29 DIAGNOSIS — Z789 Other specified health status: Secondary | ICD-10-CM

## 2017-09-29 DIAGNOSIS — Z959 Presence of cardiac and vascular implant and graft, unspecified: Secondary | ICD-10-CM | POA: Diagnosis not present

## 2017-09-29 NOTE — Assessment & Plan Note (Signed)
hitory of essential hypertension blood pressure masured t 129/72. He is on metoprolol and lisinopril. Continue current measures and current dosing

## 2017-09-29 NOTE — Assessment & Plan Note (Signed)
History of peripheral arterial disease status post left common iliac artery stenting by myself 08/22/16 with unsuccessful attempt at recanalization of the occluded right common iliac artery 10/13/16. I separately referred him to Dr. Myra GianottiBrabham who performed left to right femorofemoral crossover grafting 12/30/16 previous claudication improved at that time. Recent Doppler showed increased velocities within the left common iliac artery stent and the proximal anastomosis of the graft although he denies claudication.

## 2017-09-29 NOTE — Progress Notes (Signed)
09/29/2017 Derrick [name]   May 29, 1946  [id number]  Primary Physician Deland Pretty, MD Primary Cardiologist: Lorretta Harp MD FACP, [place], [place], [place]  HPI:  Derrick [name] is a 71 y.o. male thin and fit-appearing married Caucasian male father of 10, grandfather to 3 grandchildren who I last saw in the office 04/18/17.[name] He was referred by Dr. Shelia Media for cardiovascular evaluation because of a prior history of coronary stenting. Factors include treated diabetes and hyperlipidemia intolerant to statin therapy. He smoked 45 pack years and quit at the time of his myocardial infarction in 2010. He does have a family history of heart disease with a father and brother both of whom had coronary artery bypass grafting. He'll myocardial infarction back in 2010 and had stenting performed by Dr. Olevia Perches. He does get occasional atypical chest pain every other month. There also is a question of moderate mitral regurgitation. A 2-D echocardiogram showed normal LV function with mild MR. The Myoview stress test which was read as intermediate risk with inferior and lateral ischemia. Based on this, the patient was referred for cardiac catheterization to define his anatomy. It should also be noted the patient has been having right hip pain thought to be arthritic for many years. I performed cardiac catheterization on him 03/28/16 revealing a patent proximal LAD stent, 95% ostial circumflex, 70% mid AV groove circumflex a long 70% proximal and mid dominant RCA stenosis. I thought his anatomy was most suited for coronary artery bypass grafting which was performed by Dr. Cyndia Bent on 04/22/16. The LIMA graft to his OM1, vein to OM 2 and a free RIMA to the RCA. His postoperative course was uncomplicated. He is back to work now denying chest pain or shortness of breath. His major issue now is bilateral hip and calf claudication which is lifestyle limiting. He presents today for angiography and potential right common iliac artery  PTA and stenting for chronic total occlusion. He had left common iliac artery stenting 08/22/16 successfully improving his left lower extremity . I attempted to percutaneously recanalized his right common iliac chronic total occlusion of 10/13/16 unsuccessfully. He continues to have lifestyle limiting right hip claudication. We talked about options and the patient desires to proceed with surgical revascularization. I'm referred him to Dr. Trula Slade for consideration of left to right femorofemoral crossover grafting which was performed on 12/30/16 successfully. He had a postop visit with Dr. Trula Slade one month later and was released back to my care after that. He says his claudication is somewhat improved. Recent Dopplers performed 09/08/17 showed increased velocities within his left common iliac artery stent and of the proximal anastomosis of the graft although he continues to deny claudication.   Current Meds  Medication Sig  . aspirin EC 81 MG EC tablet Take 1 tablet (81 mg total) by mouth daily.  . Blood Glucose Monitoring Suppl (ONE TOUCH ULTRA SYSTEM KIT) W/DEVICE KIT Patient must check sugar TID  . clopidogrel (PLAVIX) 75 MG tablet TAKE ONE TABLET BY MOUTH ONCE DAILY WITH BREAKFAST  . glucose blood (ONE TOUCH ULTRA TEST) test strip USE ONE STRIP TO CHECK GLUCOSE 4 TIMES DAILY AS NEEDED  . HYDROcodone-acetaminophen (NORCO/VICODIN) 5-325 MG tablet Take 1 tablet by mouth every 6 (six) hours as needed for moderate pain. (Patient taking differently: Take 1 tablet by mouth daily as needed for moderate pain. )  . Insulin Human (INSULIN PUMP) SOLN Inject 1 each into the skin continuous. Meal settings: 2.7 small, 5.5 medium, 8.5 large  Humalog  . lisinopril (PRINIVIL,ZESTRIL) 2.5 MG tablet Take 2.5 mg by mouth daily.  . metoprolol tartrate (LOPRESSOR) 25 MG tablet Take 0.5 tablets (12.5 mg total) by mouth 2 (two) times daily.  . nitroGLYCERIN (NITROSTAT) 0.4 MG SL tablet Place 1 tablet (0.4 mg total) under the  tongue every 5 (five) minutes as needed. (Patient taking differently: Place 0.4 mg under the tongue every 5 (five) minutes as needed for chest pain. )  . ONETOUCH DELICA LANCETS 35H MISC USE ONE  4 TIMES DAILY AS NEEDED  . pantoprazole (PROTONIX) 40 MG tablet TAKE ONE TABLET BY MOUTH ONCE DAILY  . research study medication Take 1 each by mouth daily. Study drug for cholesterol from Dr. Deland Pretty     Allergies  Allergen Reactions  . Atorvastatin Other (See Comments)    MYALGIAS MUSCLE CRAMPS   . Lovastatin Other (See Comments)    MYALGIAS MUSCLE CRAMPS  . Pravastatin Other (See Comments)    MYALGIAS MUSCLE CRAMPS  . Simvastatin Other (See Comments)    MYALGIAS MUSCLE CRAMPS    Social History   Social History  . Marital status: Married    Spouse name: N/A  . Number of children: N/A  . Years of education: N/A   Occupational History  . Not on file.   Social History Main Topics  . Smoking status: Former Smoker    Packs/day: 1.00    Years: 50.00    Types: Cigarettes    Quit date: 01/06/2009  . Smokeless tobacco: Never Used  . Alcohol use No     Comment: "drank some in high school"  . Drug use: No  . Sexual activity: No   Other Topics Concern  . Not on file   Social History Narrative   Married, works part time at [employer] in [place], [place].   Former smoker: quit 01/2011.  No alcohol or drugs.   No formal exercise.     Review of Systems: General: negative for chills, fever, night sweats or weight changes.  Cardiovascular: negative for chest pain, dyspnea on exertion, edema, orthopnea, palpitations, paroxysmal nocturnal dyspnea or shortness of breath Dermatological: negative for rash Respiratory: negative for cough or wheezing Urologic: negative for hematuria Abdominal: negative for nausea, vomiting, diarrhea, bright red blood per rectum, melena, or hematemesis Neurologic: negative for visual changes, syncope, or dizziness All other systems reviewed  and are otherwise negative except as noted above.    Blood pressure 129/72, pulse 66, height '5\' 6"'$  (1.676 m), weight 148 lb (67.1 kg).  General appearance: alert and no distress Neck: no adenopathy, no carotid bruit, no JVD, supple, symmetrical, trachea midline and thyroid not enlarged, symmetric, no tenderness/mass/nodules Lungs: clear to auscultation bilaterally Heart: regular rate and rhythm, S1, S2 normal, no murmur, click, rub or gallop Extremities: extremities normal, atraumatic, no cyanosis or edema Pulses: 2+ and symmetric Bruit in the left groin consistent with iliac and anastomotic stenosis on the left Skin: Skin color, texture, turgor normal. No rashes or lesions Neurologic: Alert and oriented X 3, normal strength and tone. Normal symmetric reflexes. Normal coordination and gait  EKG not performed today  ASSESSMENT AND PLAN:   Statin intolerance History of hyperlipidemia intolerant to statin therapy  Hypertension associated with diabetes ([place])  hitory of essential hypertension blood pressure masured t 129/72. He is on metoprolol and lisinopril. Continue current measures and current dosing  S/P CABG x 3 History of CAD status post coronary artery bypass grafting 35/19/17 with a LIMA to OM1,  vein graft to OM 2 and a RIMA to RCA. He denies chest pain or shortness of breath.  S/P arterial stent, 08/22/16 [facility] PTA/Stent  History of peripheral arterial disease status post left common iliac artery stenting by myself 08/22/16 with unsuccessful attempt at recanalization of the occluded right common iliac artery 10/13/16. I separately referred him to Dr. Trula Slade who performed left to right femorofemoral crossover grafting 12/30/16 previous claudication improved at that time. Recent Doppler showed increased velocities within the left common iliac artery stent and the proximal anastomosis of the graft although he denies claudication.      Lorretta Harp MD FACP,FACC,FAHA,  [facility] 09/29/2017 11:45 AM

## 2017-09-29 NOTE — Assessment & Plan Note (Signed)
History of hyperlipidemia intolerant to statin therapy 

## 2017-09-29 NOTE — Patient Instructions (Addendum)
Medication Instructions: Your physician recommends that you continue on your current medications as directed. Please refer to the Current Medication list given to you today.   Testing/Procedures: Your physician has requested that you have a lower extremity arterial duplex. During this test, ultrasound is used to evaluate arterial blood flow in the legs. Allow one hour for this exam. There are no restrictions or special instructions.  Your physician has requested that you have a aorta and iliac duplex. During this test, an ultrasound is used to evaluate blood flow to the aorta and iliac arteries. Allow one hour for this exam. Do not eat after midnight the day before and avoid carbonated beverages.   Your physician has requested that you have an ankle brachial index (ABI). During this test an ultrasound and blood pressure cuff are used to evaluate the arteries that supply the arms and legs with blood. Allow thirty minutes for this exam. There are no restrictions or special instructions. IN 6 MONTHS--PRIOR TO APPT WITH DR. Allyson SabalBERRY   Follow-Up: Your physician wants you to follow-up in: 6 months with Dr. Gerrie NordmannBerry--after dopplers. You will receive a reminder letter in the mail two months in advance. If you don't receive a letter, please call our office to schedule the follow-up appointment.  If you need a refill on your cardiac medications before your next appointment, please call your pharmacy.

## 2017-09-29 NOTE — Assessment & Plan Note (Signed)
History of CAD status post coronary artery bypass grafting 35/19/17 with a LIMA to OM1, vein graft to OM 2 and a RIMA to RCA. He denies chest pain or shortness of breath.

## 2017-10-02 DIAGNOSIS — Z862 Personal history of diseases of the blood and blood-forming organs and certain disorders involving the immune mechanism: Secondary | ICD-10-CM | POA: Diagnosis not present

## 2017-10-06 DIAGNOSIS — G5601 Carpal tunnel syndrome, right upper limb: Secondary | ICD-10-CM | POA: Diagnosis not present

## 2017-10-20 DIAGNOSIS — H25813 Combined forms of age-related cataract, bilateral: Secondary | ICD-10-CM | POA: Diagnosis not present

## 2017-10-20 DIAGNOSIS — H52222 Regular astigmatism, left eye: Secondary | ICD-10-CM | POA: Diagnosis not present

## 2017-10-20 DIAGNOSIS — H5203 Hypermetropia, bilateral: Secondary | ICD-10-CM | POA: Diagnosis not present

## 2017-10-20 DIAGNOSIS — H524 Presbyopia: Secondary | ICD-10-CM | POA: Diagnosis not present

## 2017-12-11 DIAGNOSIS — E1140 Type 2 diabetes mellitus with diabetic neuropathy, unspecified: Secondary | ICD-10-CM | POA: Diagnosis not present

## 2017-12-11 DIAGNOSIS — I10 Essential (primary) hypertension: Secondary | ICD-10-CM | POA: Diagnosis not present

## 2017-12-25 DIAGNOSIS — Z79899 Other long term (current) drug therapy: Secondary | ICD-10-CM | POA: Diagnosis not present

## 2017-12-25 DIAGNOSIS — G629 Polyneuropathy, unspecified: Secondary | ICD-10-CM | POA: Diagnosis not present

## 2018-01-08 ENCOUNTER — Other Ambulatory Visit: Payer: Self-pay | Admitting: Cardiovascular Disease

## 2018-01-08 NOTE — Telephone Encounter (Signed)
Rx request sent to pharmacy.  

## 2018-02-20 DIAGNOSIS — E1140 Type 2 diabetes mellitus with diabetic neuropathy, unspecified: Secondary | ICD-10-CM | POA: Diagnosis not present

## 2018-02-22 DIAGNOSIS — Z79899 Other long term (current) drug therapy: Secondary | ICD-10-CM | POA: Diagnosis not present

## 2018-02-22 DIAGNOSIS — G629 Polyneuropathy, unspecified: Secondary | ICD-10-CM | POA: Diagnosis not present

## 2018-02-23 ENCOUNTER — Ambulatory Visit
Admission: RE | Admit: 2018-02-23 | Discharge: 2018-02-23 | Disposition: A | Discharge status: Home / Self Care | Payer: Medicare Other | Source: Ambulatory Visit | Attending: Acute Care | Admitting: Acute Care

## 2018-02-23 ENCOUNTER — Telehealth: Payer: Self-pay

## 2018-02-23 DIAGNOSIS — Z87891 Personal history of nicotine dependence: Secondary | ICD-10-CM | POA: Diagnosis not present

## 2018-02-23 NOTE — Telephone Encounter (Signed)
Received call report from Diane with [facility] regarding CT low dose.  Results are within epic.  Will route to SG to make aware.

## 2018-02-27 ENCOUNTER — Telehealth: Payer: Self-pay | Admitting: Acute Care

## 2018-02-27 DIAGNOSIS — R911 Solitary pulmonary nodule: Secondary | ICD-10-CM

## 2018-02-27 NOTE — Telephone Encounter (Signed)
I have called [name] and I have let him know that his scan was read as a Lung RADS 4 B which  indicates suspicious findings for which additional diagnostic testing and or tissue sampling is recommended.I explained that there has been a change in size of a nodule in his right lung when compared to last years scan. I explained that we will place orders for a PET scan to take a closer look at this area to determine the next best step in his plan of care. He asked several good questions that I answered. He has agreed to a PET scan.I will place the orders now. He verbalized understanding that someone will call him to schedule his PET scan. He had no further questions at completion of the call.  [name], please fax results to PCP and let them know we have called the patient with the results and that we are scheduling a PET scan. Let them know we will let them know next steps and results of the PET. Thanks so much.

## 2018-02-28 NOTE — Telephone Encounter (Signed)
Results faxed to PCP 

## 2018-03-06 ENCOUNTER — Ambulatory Visit (HOSPITAL_COMMUNITY)
Admission: RE | Admit: 2018-03-06 | Discharge: 2018-03-06 | Disposition: A | Discharge status: Home / Self Care | Payer: PPO | Source: Ambulatory Visit | Attending: Acute Care | Admitting: Acute Care

## 2018-03-06 DIAGNOSIS — R911 Solitary pulmonary nodule: Secondary | ICD-10-CM | POA: Insufficient documentation

## 2018-03-06 DIAGNOSIS — R918 Other nonspecific abnormal finding of lung field: Secondary | ICD-10-CM | POA: Diagnosis not present

## 2018-03-06 LAB — GLUCOSE, CAPILLARY
GLUCOSE-CAPILLARY: 254 mg/dL — AB (ref 65–99)
Glucose-Capillary: 263 mg/dL — ABNORMAL HIGH (ref 65–99)

## 2018-03-06 MED ORDER — FLUDEOXYGLUCOSE F - 18 (FDG) INJECTION
7.4300 | Freq: Once | INTRAVENOUS | Status: AC | PRN
  Administered 2018-03-06: 7.43 via INTRAVENOUS

## 2018-03-09 ENCOUNTER — Other Ambulatory Visit: Payer: Self-pay | Admitting: Acute Care

## 2018-03-09 ENCOUNTER — Ambulatory Visit (HOSPITAL_COMMUNITY)
Admission: RE | Admit: 2018-03-09 | Discharge: 2018-03-09 | Disposition: A | Discharge status: Home / Self Care | Payer: PPO | Source: Ambulatory Visit | Attending: Cardiovascular Disease | Admitting: Cardiovascular Disease

## 2018-03-09 ENCOUNTER — Other Ambulatory Visit: Payer: Self-pay | Admitting: *Deleted

## 2018-03-09 DIAGNOSIS — I745 Embolism and thrombosis of iliac artery: Secondary | ICD-10-CM | POA: Diagnosis not present

## 2018-03-09 DIAGNOSIS — Z87891 Personal history of nicotine dependence: Secondary | ICD-10-CM

## 2018-03-09 DIAGNOSIS — I739 Peripheral vascular disease, unspecified: Secondary | ICD-10-CM | POA: Diagnosis not present

## 2018-03-09 DIAGNOSIS — I708 Atherosclerosis of other arteries: Secondary | ICD-10-CM | POA: Insufficient documentation

## 2018-03-09 DIAGNOSIS — Z122 Encounter for screening for malignant neoplasm of respiratory organs: Secondary | ICD-10-CM

## 2018-03-13 ENCOUNTER — Other Ambulatory Visit: Payer: Self-pay

## 2018-03-13 DIAGNOSIS — I739 Peripheral vascular disease, unspecified: Secondary | ICD-10-CM

## 2018-03-21 ENCOUNTER — Institutional Professional Consult (permissible substitution): Payer: PPO | Admitting: Pulmonary Disease

## 2018-03-22 DIAGNOSIS — K219 Gastro-esophageal reflux disease without esophagitis: Secondary | ICD-10-CM | POA: Diagnosis not present

## 2018-03-22 DIAGNOSIS — I10 Essential (primary) hypertension: Secondary | ICD-10-CM | POA: Diagnosis not present

## 2018-03-22 DIAGNOSIS — E119 Type 2 diabetes mellitus without complications: Secondary | ICD-10-CM | POA: Diagnosis not present

## 2018-04-05 DIAGNOSIS — I10 Essential (primary) hypertension: Secondary | ICD-10-CM | POA: Diagnosis not present

## 2018-04-05 DIAGNOSIS — J069 Acute upper respiratory infection, unspecified: Secondary | ICD-10-CM | POA: Diagnosis not present

## 2018-04-05 DIAGNOSIS — E1140 Type 2 diabetes mellitus with diabetic neuropathy, unspecified: Secondary | ICD-10-CM | POA: Diagnosis not present

## 2018-04-05 DIAGNOSIS — Z125 Encounter for screening for malignant neoplasm of prostate: Secondary | ICD-10-CM | POA: Diagnosis not present

## 2018-04-05 DIAGNOSIS — E7800 Pure hypercholesterolemia, unspecified: Secondary | ICD-10-CM | POA: Diagnosis not present

## 2018-04-06 ENCOUNTER — Ambulatory Visit (INDEPENDENT_AMBULATORY_CARE_PROVIDER_SITE_OTHER): Payer: PPO | Admitting: Pulmonary Disease

## 2018-04-06 ENCOUNTER — Encounter: Payer: Self-pay | Admitting: Pulmonary Disease

## 2018-04-06 VITALS — BP 124/64 | HR 68 | Ht 66.0 in | Wt 144.8 lb

## 2018-04-06 DIAGNOSIS — J439 Emphysema, unspecified: Secondary | ICD-10-CM

## 2018-04-06 DIAGNOSIS — R918 Other nonspecific abnormal finding of lung field: Secondary | ICD-10-CM | POA: Diagnosis not present

## 2018-04-06 DIAGNOSIS — Z87891 Personal history of nicotine dependence: Secondary | ICD-10-CM

## 2018-04-06 NOTE — Progress Notes (Addendum)
Derrick [name]    [id number]    1946/08/18  Primary Care Physician:Pharr, Thayer Jew, MD  Referring Physician: Deland Pretty, MD [address] [place], [place] [address]  Chief complaint: Follow-up for abnormal CT scan  HPI: 72 year old with history of smoking, COPD, coronary artery disease, diabetes He had screening CTs of the chest which is read as worsening of the right upper lobe nodular opacity, scarring and is here for evaluation. PET scan does not show any FDG uptake No complaints.  Denies any dyspnea on exertion, cough, sputum production, hemoptysis, loss of weight, loss of appetite, fatigue  Pets: 2 dogs, no birds, farm animals Occupation: Works part-time as a Water quality scientist for [employer] Exposures: No known exposures, no mold, hot tub, Jacuzzi Smoking history: 50-pack-year smoking.  Quit in 2010 Travel history: Lived in [place] all his life. Relevant family history: Not significant  Outpatient Encounter Medications as of 04/06/2018  Medication Sig  . aspirin EC 81 MG EC tablet Take 1 tablet (81 mg total) by mouth daily.  . Blood Glucose Monitoring Suppl (ONE TOUCH ULTRA SYSTEM KIT) W/DEVICE KIT Patient must check sugar TID  . clopidogrel (PLAVIX) 75 MG tablet TAKE ONE TABLET BY MOUTH ONCE DAILY WITH BREAKFAST  . glucose blood (ONE TOUCH ULTRA TEST) test strip USE ONE STRIP TO CHECK GLUCOSE 4 TIMES DAILY AS NEEDED  . HYDROcodone-acetaminophen (NORCO/VICODIN) 5-325 MG tablet Take 1 tablet by mouth every 6 (six) hours as needed for moderate pain. (Patient taking differently: Take 1 tablet by mouth daily as needed for moderate pain. )  . Insulin Human (INSULIN PUMP) SOLN Inject 1 each into the skin continuous. Meal settings: 2.7 small, 5.5 medium, 8.5 large  Humalog  . lisinopril (PRINIVIL,ZESTRIL) 2.5 MG tablet Take 2.5 mg by mouth daily.  . metoprolol tartrate (LOPRESSOR) 25 MG tablet Take 0.5 tablets (12.5 mg total) by mouth 2 (two)  times daily.  . nitroGLYCERIN (NITROSTAT) 0.4 MG SL tablet Place 1 tablet (0.4 mg total) under the tongue every 5 (five) minutes as needed. (Patient taking differently: Place 0.4 mg under the tongue every 5 (five) minutes as needed for chest pain. )  . ONETOUCH DELICA LANCETS 07E MISC USE ONE  4 TIMES DAILY AS NEEDED  . pantoprazole (PROTONIX) 40 MG tablet TAKE 1 TABLET BY MOUTH ONCE DAILY  . research study medication Take 1 each by mouth daily. Study drug for cholesterol from Dr. Deland Pretty   No facility-administered encounter medications on file as of 04/06/2018.     Allergies as of 04/06/2018 - Review Complete 04/06/2018  Allergen Reaction Noted  . Atorvastatin Other (See Comments) 11/12/2014  . Lovastatin Other (See Comments) 07/02/2014  . Pravastatin Other (See Comments) 03/09/2015  . Simvastatin Other (See Comments)     Past Medical History:  Diagnosis Date  . Anginal pain ([facility])    occ   . Chronic renal insufficiency, stage III (moderate) ([facility]) 2013   CrCl est high 50s (borderline stage II/III pt denies 12/27/16  . COPD (chronic obstructive pulmonary disease) ([facility])   . Coronary atherosclerosis of native coronary vessel    S/P NSTEMI 04/11/2009--DEL stent to LAD.  Nuclear stress test 11/2010 NEG, EF normal  . Diabetic peripheral neuropathy ([facility])    Painful; 1 vicodin bid very helpful (pt resistant to alternative tx's)  . Early cataracts, bilateral 04/24/2012   [name] in [place], [place]  . GERD (gastroesophageal reflux disease)   . Heart  murmur    patient denies  . Herpes zoster    Two separate outbreaks--one on left axillary/back region, one on right axillary/back region  . Hyperlipidemia   . Hypertension   . Mitral regurgitation 08/2013   Moderate (echo)  . Myocardial infarction ([facility]) 2010  . Peripheral arterial disease ([place])   . S/P arterial stent, 08/22/16 Lt [facility] PTA/Stent  08/23/2016  . Type II diabetes mellitus ([facility])     Past Surgical History:  Procedure  Laterality Date  . ANTERIOR CERVICAL DECOMP/DISCECTOMY FUSION  2004 X 2   "1st one collapsed; had to go in 1 month later & do the front and back"  . BACK SURGERY    . CARDIAC CATHETERIZATION N/A 03/28/2016   Procedure: Left Heart Cath and Coronary Angiography;  Surgeon: Lorretta Harp, MD;  Location: [facility];  Service: Cardiovascular;  Laterality: N/A;  . CARDIOVASCULAR STRESS TEST  11/2010   No ischemia/normal EF  . CARPAL TUNNEL RELEASE Left 05/28/2015   Procedure: LEFT HAND CARPAL TUNNEL RELEASE;  Surgeon: Iran Planas, MD;  Location: [facility];  Service: Orthopedics;  Laterality: Left;  . COLONOSCOPY  12/2010   Normal screening colonoscopy; rpt 10 yrs  . CORONARY ANGIOPLASTY WITH STENT PLACEMENT  04/2009   DES to LAD  . CORONARY ARTERY BYPASS GRAFT N/A 04/22/2016   Procedure: CORONARY ARTERY BYPASS GRAFTING (CABG) LIMA to OM1 SVG to OM2 FREE RIMA to RCA  ENDOSCOPIC HARVEST GREATER SAPHENOUS VEIN -Right Thigh;  Surgeon: Gaye Pollack, MD;  Location: [facility];  Service: Open Heart Surgery;  Laterality: N/A;  . cryotherapy to AK lesion on nose  01/2010  . eye exam,diabetic  02/2011   Normal diabetic retinopathy screening exam at [facility] in [place], [place].  [name] FEMORAL-FEMORAL BYPASS GRAFT Bilateral 12/30/2016   Procedure: BYPASS GRAFT LEFT FEMORAL-RIGHT FEMORAL ARTERY;  Surgeon: Serafina Mitchell, MD;  Location: [facility];  Service: Vascular;  Laterality: Bilateral;  . ILIAC VEIN ANGIOPLASTY / STENTING  08/22/2016   Abdominal aortogram, bilateral iliac angiogram, bifemoral runoff  . LOWER EXTREMITY ANGIOGRAM  10/13/2016  . LUMBAR FUSION  2002   "put screws in"  . PERIPHERAL VASCULAR CATHETERIZATION N/A 03/28/2016   Procedure: Abdominal Aortogram;  Surgeon: Lorretta Harp, MD;  Location: [facility];  Service: Cardiovascular;  Laterality: N/A;  . PERIPHERAL VASCULAR CATHETERIZATION Bilateral 08/22/2016   Procedure: Lower Extremity Angiography;  Surgeon: Lorretta Harp, MD;  Location: [facility];  Service: Cardiovascular;  Laterality: Bilateral;  . PERIPHERAL VASCULAR CATHETERIZATION Left 08/22/2016   Procedure: Peripheral Vascular Intervention;  Surgeon: Lorretta Harp, MD;  Location: [facility];  Service: Cardiovascular;  Laterality: Left;  COMMON ILIAC  . PERIPHERAL VASCULAR CATHETERIZATION Bilateral 10/13/2016   Procedure: Lower Extremity Angiography;  Surgeon: Lorretta Harp, MD;  Location: [facility];  Service: Cardiovascular;  Laterality: Bilateral;  . PERIPHERAL VASCULAR CATHETERIZATION Right 10/13/2016   Procedure: Peripheral Vascular Balloon Angioplasty;  Surgeon: Lorretta Harp, MD;  Location: [facility];  Service: Cardiovascular;  Laterality: Right;  Right Common ILIac  . POSTERIOR FUSION CERVICAL SPINE  2004  . TEE WITHOUT CARDIOVERSION N/A 04/22/2016   Procedure: TRANSESOPHAGEAL ECHOCARDIOGRAM (TEE);  Surgeon: Gaye Pollack, MD;  Location: [facility];  Service: Open Heart Surgery;  Laterality: N/A;  . TRANSTHORACIC ECHOCARDIOGRAM  08/2013   LVH, EF 55-65%, no WMA.  Moderate mitral regurg    Family History  Problem Relation  Age of Onset  . Cancer Mother        brain  . Diabetes Father   . Heart disease Father   . Diabetes Brother   . Heart disease Brother   . Diabetes Son   . Hyperlipidemia Son   . Coronary artery disease Unknown        family hx of    Social History   Socioeconomic History  . Marital status: Married    Spouse name: Not on file  . Number of children: Not on file  . Years of education: Not on file  . Highest education level: Not on file  Occupational History  . Not on file  Social Needs  . Financial resource strain: Not on file  . Food insecurity:    Worry: Not on file    Inability: Not on file  . Transportation needs:    Medical: Not on file    Non-medical: Not on file  Tobacco Use  . Smoking status: Former Smoker    Packs/day: 1.00    Years: 50.00    Pack years:  50.00    Types: Cigarettes    Last attempt to quit: 01/06/2009    Years since quitting: 9.2  . Smokeless tobacco: Never Used  Substance and Sexual Activity  . Alcohol use: No    Alcohol/week: 0.0 oz    Comment: "drank some in high school"  . Drug use: No  . Sexual activity: Never  Lifestyle  . Physical activity:    Days per week: Not on file    Minutes per session: Not on file  . Stress: Not on file  Relationships  . Social connections:    Talks on phone: Not on file    Gets together: Not on file    Attends religious service: Not on file    Active member of club or organization: Not on file    Attends meetings of clubs or organizations: Not on file    Relationship status: Not on file  . Intimate partner violence:    Fear of current or ex partner: Not on file    Emotionally abused: Not on file    Physically abused: Not on file    Forced sexual activity: Not on file  Other Topics Concern  . Not on file  Social History Narrative   Married, works part time at [employer] in [place], [place].   Former smoker: quit 01/2011.  No alcohol or drugs.   No formal exercise.    Review of systems: Review of Systems  Constitutional: Negative for fever and chills.  HENT: Negative.   Eyes: Negative for blurred vision.  Respiratory: as per HPI  Cardiovascular: Negative for chest pain and palpitations.  Gastrointestinal: Negative for vomiting, diarrhea, blood per rectum. Genitourinary: Negative for dysuria, urgency, frequency and hematuria.  Musculoskeletal: Negative for myalgias, back pain and joint pain.  Skin: Negative for itching and rash.  Neurological: Negative for dizziness, tremors, focal weakness, seizures and loss of consciousness.  Endo/Heme/Allergies: Negative for environmental allergies.  Psychiatric/Behavioral: Negative for depression, suicidal ideas and hallucinations.  All other systems reviewed and are negative.  Physical Exam: Blood pressure 124/64, pulse 68,  height _0  (1.676 m), weight 144 lb 12.8 oz (65.7 kg), SpO2 97 %. Gen:      No acute distress HEENT:  EOMI, sclera anicteric Neck:     No masses; no thyromegaly Lungs:    Clear to auscultation bilaterally; normal respiratory effort CV:  Regular rate and rhythm; no murmurs Abd:      + bowel sounds; soft, non-tender; no palpable masses, no distension Ext:    No edema; adequate peripheral perfusion Skin:      Warm and dry; no rash Neuro: alert and oriented x 3 Psych: normal mood and affect  Data Reviewed: PFTs 04/19/2016 FVC 2.83 [94%], FEV1 2.03 [94%], F/F 73, TLC 89%, DLCO 58%, DLCO/VA 86% Minimal obstructive airway disease, moderate diffusion defect that corrects for alveolar volume.  Screening CT chest 01/19/2016- biapical scarring, mild emphysema Screening CT chest 02/17/2017- stable biapical opacities, emphysema Screening CT chest 02/23/2018- possible increase in right upper lobe scarring, emphysema PET scan 03/06/2018- right apical abnormality is stable with minimal FDG uptake.  No other significant uptake I have reviewed the images personally  Assessment:  Abnormal CT scan Although the latest screening CT in March was read as increase in right upper lobe opacity, by my review of the images it appears stable dating back to 2017.  PET scan shows no FDG uptake in that area or any other site which is reassuring. Any subtle changes may be involving changes of benign apical scarring with low suspicion for malignancy. There is no evidence of interstitial lung disease.  He will need to continue his annual low-dose screening CT as scheduled.  Discussed with patient and this can be followed up by Dr. Shelia Media, his primary care as per patient preference.  We will be available as needed in case the follow-up scan shows any worsening.  Emphysema Minimal obstructive changes on PFT.  He is asymptomatic and does not need to be on inhalers  Health maintenance 07/02/2014-Prevnar 13 He is due for  Pneumovax  and is not sure if he received it. He will discuss with primary care He does not take the flu vaccination  Plan/Recommendations: - Continue annual screening CT of the chest by primary care  Follow up as needed  Marshell Garfinkel MD [facility] 04/06/2018, 9:51 AM  CC: Deland Pretty, MD

## 2018-04-06 NOTE — Patient Instructions (Signed)
I have reviewed your CT scan.  You have some scarring in the upper part of your lungs which is negative on PET scan Overall the appearance indicates that this is likely a benign process Follow-up with your screening CT next year You can follow-up with your primary care physician Dr. Renne Crigler for this.  We will be available as needed in case the repeat scan shows any worrisome findings.

## 2018-04-18 NOTE — Progress Notes (Signed)
I agree with the assessment and plan as directed by Dr. Colbert Coyer and will make a RV for the patient to resume CPAP use.     Senon Nixon, MD

## 2018-04-20 DIAGNOSIS — Z79899 Other long term (current) drug therapy: Secondary | ICD-10-CM | POA: Diagnosis not present

## 2018-04-20 DIAGNOSIS — G629 Polyneuropathy, unspecified: Secondary | ICD-10-CM | POA: Diagnosis not present

## 2018-04-23 ENCOUNTER — Other Ambulatory Visit: Payer: Self-pay | Admitting: Cardiovascular Disease

## 2018-04-23 NOTE — Telephone Encounter (Signed)
Rx has been sent to the pharmacy electronically. ° °

## 2018-05-11 ENCOUNTER — Ambulatory Visit: Payer: PPO | Admitting: Cardiovascular Disease

## 2018-06-01 ENCOUNTER — Other Ambulatory Visit: Payer: Self-pay | Admitting: Cardiovascular Disease

## 2018-06-01 ENCOUNTER — Encounter: Payer: Self-pay | Admitting: Cardiovascular Disease

## 2018-06-01 ENCOUNTER — Ambulatory Visit (INDEPENDENT_AMBULATORY_CARE_PROVIDER_SITE_OTHER): Payer: PPO | Admitting: Cardiovascular Disease

## 2018-06-01 VITALS — BP 114/60 | HR 63 | Ht 66.0 in | Wt 143.0 lb

## 2018-06-01 DIAGNOSIS — I779 Disorder of arteries and arterioles, unspecified: Secondary | ICD-10-CM

## 2018-06-01 DIAGNOSIS — Z789 Other specified health status: Secondary | ICD-10-CM

## 2018-06-01 DIAGNOSIS — I739 Peripheral vascular disease, unspecified: Secondary | ICD-10-CM

## 2018-06-01 DIAGNOSIS — I6523 Occlusion and stenosis of bilateral carotid arteries: Secondary | ICD-10-CM

## 2018-06-01 DIAGNOSIS — I10 Essential (primary) hypertension: Secondary | ICD-10-CM

## 2018-06-01 DIAGNOSIS — Z951 Presence of aortocoronary bypass graft: Secondary | ICD-10-CM | POA: Diagnosis not present

## 2018-06-01 DIAGNOSIS — E1159 Type 2 diabetes mellitus with other circulatory complications: Secondary | ICD-10-CM | POA: Diagnosis not present

## 2018-06-01 DIAGNOSIS — E1140 Type 2 diabetes mellitus with diabetic neuropathy, unspecified: Secondary | ICD-10-CM | POA: Diagnosis not present

## 2018-06-01 NOTE — Assessment & Plan Note (Signed)
History of coronary artery disease status post stenting remotely and ultimately requiring coronary artery bypass grafting 04/22/2016 by Dr. Laneta SimmersBartle with a LIMA to his OM1, vein to OM 2, and free RIMA to the RCA.  His postop course was uncomplicated.  He denies chest pain or shortness of breath.

## 2018-06-01 NOTE — Assessment & Plan Note (Signed)
History of carotid artery disease with Dopplers performed 12/09/2016 revealing moderate right ICA stenosis which we will repeat.

## 2018-06-01 NOTE — Patient Instructions (Signed)
Medication Instructions:   NO CHANGE  Testing/Procedures:  Your physician has requested that you have a carotid duplex. This test is an ultrasound of the carotid arteries in your neck. It looks at blood flow through these arteries that supply the brain with blood. Allow one hour for this exam. There are no restrictions or special instructions.    Follow-Up:  Your physician wants you to follow-up in: ONE YEAR WITH DR BERRY You will receive a reminder letter in the mail two months in advance. If you don't receive a letter, please call our office to schedule the follow-up appointment.   If you need a refill on your cardiac medications before your next appointment, please call your pharmacy.  

## 2018-06-01 NOTE — Assessment & Plan Note (Signed)
History of essential hypertension blood pressure measured today 114/60.  He is on lisinopril and metoprolol.  Continue current meds at current dosing.

## 2018-06-01 NOTE — Assessment & Plan Note (Signed)
She of hyperlipidemia intolerant to statin therapy.  He is on a study drug through his PCP.  His most recent lipid profile performed 04/05/2018 revealed total cholesterol 200, LDL 130 and HDL of 43.  We will explore the possibility of beginning [place].

## 2018-06-01 NOTE — Progress Notes (Signed)
06/01/2018 Derrick [name]   July 09, 1946  [id number]  Primary Physician Deland Pretty, MD Primary Cardiologist: Lorretta Harp MD FACP, [place], [place], [place]  HPI:  Derrick [name] is a 72 y.o.  thin and fit-appearing married Caucasian male father of 19, grandfather to 3 grandchildren who I last saw in the office  09/29/2017.[name] He was referred by Dr. Shelia Media for cardiovascular evaluation because of a prior history of coronary stenting. Factors include treated diabetes and hyperlipidemia intolerant to statin therapy. He smoked 45 pack years and quit at the time of his myocardial infarction in 2010. He does have a family history of heart disease with a father and brother both of whom had coronary artery bypass grafting. He'll myocardial infarction back in 2010 and had stenting performed by Dr. Olevia Perches. He does get occasional atypical chest pain every other month. There also is a question of moderate mitral regurgitation. A 2-D echocardiogram showed normal LV function with mild MR. The Myoview stress test which was read as intermediate risk with inferior and lateral ischemia. Based on this, the patient was referred for cardiac catheterization to define his anatomy. It should also be noted the patient has been having right hip pain thought to be arthritic for many years. I performed cardiac catheterization on him 03/28/16 revealing a patent proximal LAD stent, 95% ostial circumflex, 70% mid AV groove circumflex a long 70% proximal and mid dominant RCA stenosis. I thought his anatomy was most suited for coronary artery bypass grafting which was performed by Dr. Cyndia Bent on 04/22/16. The LIMA graft to his OM1, vein to OM 2 and a free RIMA to the RCA. His postoperative course was uncomplicated. He is back to work now denying chest pain or shortness of breath. His major issue now is bilateral hip and calf claudication which is lifestyle limiting. He presents today for angiography and potential right common iliac artery  PTA and stenting for chronic total occlusion. He had left common iliac artery stenting 08/22/16 successfully improving his left lower extremity . I attempted to percutaneously recanalized his right common iliac chronic total occlusion of 10/13/16 unsuccessfully. He continues to have lifestyle limiting right hip claudication. We talked about options and the patient desires to proceed with surgical revascularization. I'm referred him to Dr. Trula Slade for consideration of left to right femorofemoral crossover grafting which was performed on 12/30/16 successfully. He had a postop visit with Dr. Trula Slade one month later and was released back to my care after that. He says his claudication is somewhat improved. Recent Dopplers performed 03/09/2018 showed increased velocities within his left common iliac artery stent and of the proximal anastomosis of the graft although he continues to deny claudication. Since I saw him a year ago he is remained stable denying chest pain, shortness of breath or claudication.    Current Meds  Medication Sig  . aspirin EC 81 MG EC tablet Take 1 tablet (81 mg total) by mouth daily.  . Blood Glucose Monitoring Suppl (ONE TOUCH ULTRA SYSTEM KIT) W/DEVICE KIT Patient must check sugar TID  . clopidogrel (PLAVIX) 75 MG tablet TAKE 1 TABLET BY MOUTH ONCE DAILY WITH BREAKFAST  . glucose blood (ONE TOUCH ULTRA TEST) test strip USE ONE STRIP TO CHECK GLUCOSE 4 TIMES DAILY AS NEEDED  . HYDROcodone-acetaminophen (NORCO/VICODIN) 5-325 MG tablet Take 1 tablet by mouth every 6 (six) hours as needed for moderate pain. (Patient taking differently: Take 1 tablet by mouth daily as needed for moderate pain. )  .  Insulin Human (INSULIN PUMP) SOLN Inject 1 each into the skin continuous. Meal settings: 2.7 small, 5.5 medium, 8.5 large  Humalog  . lisinopril (PRINIVIL,ZESTRIL) 2.5 MG tablet Take 2.5 mg by mouth daily.  . metoprolol tartrate (LOPRESSOR) 25 MG tablet Take 0.5 tablets (12.5 mg total) by mouth  2 (two) times daily.  . nitroGLYCERIN (NITROSTAT) 0.4 MG SL tablet Place 1 tablet (0.4 mg total) under the tongue every 5 (five) minutes as needed. (Patient taking differently: Place 0.4 mg under the tongue every 5 (five) minutes as needed for chest pain. )  . ONETOUCH DELICA LANCETS 61W MISC USE ONE  4 TIMES DAILY AS NEEDED  . pantoprazole (PROTONIX) 40 MG tablet TAKE 1 TABLET BY MOUTH ONCE DAILY  . research study medication Take 1 each by mouth daily. Study drug for cholesterol from Dr. Deland Pretty     Allergies  Allergen Reactions  . Atorvastatin Other (See Comments)    MYALGIAS MUSCLE CRAMPS   . Lovastatin Other (See Comments)    MYALGIAS MUSCLE CRAMPS  . Pravastatin Other (See Comments)    MYALGIAS MUSCLE CRAMPS  . Simvastatin Other (See Comments)    MYALGIAS MUSCLE CRAMPS    Social History   Socioeconomic History  . Marital status: Married    Spouse name: Not on file  . Number of children: Not on file  . Years of education: Not on file  . Highest education level: Not on file  Occupational History  . Not on file  Social Needs  . Financial resource strain: Not on file  . Food insecurity:    Worry: Not on file    Inability: Not on file  . Transportation needs:    Medical: Not on file    Non-medical: Not on file  Tobacco Use  . Smoking status: Former Smoker    Packs/day: 1.00    Years: 50.00    Pack years: 50.00    Types: Cigarettes    Last attempt to quit: 01/06/2009    Years since quitting: 9.4  . Smokeless tobacco: Never Used  Substance and Sexual Activity  . Alcohol use: No    Alcohol/week: 0.0 oz    Comment: "drank some in high school"  . Drug use: No  . Sexual activity: Never  Lifestyle  . Physical activity:    Days per week: Not on file    Minutes per session: Not on file  . Stress: Not on file  Relationships  . Social connections:    Talks on phone: Not on file    Gets together: Not on file    Attends religious service: Not on file    Active  member of club or organization: Not on file    Attends meetings of clubs or organizations: Not on file    Relationship status: Not on file  . Intimate partner violence:    Fear of current or ex partner: Not on file    Emotionally abused: Not on file    Physically abused: Not on file    Forced sexual activity: Not on file  Other Topics Concern  . Not on file  Social History Narrative   Married, works part time at [employer] in [place], [place].   Former smoker: quit 01/2011.  No alcohol or drugs.   No formal exercise.     Review of Systems: General: negative for chills, fever, night sweats or weight changes.  Cardiovascular: negative for chest pain, dyspnea on exertion, edema, orthopnea, palpitations, paroxysmal nocturnal  dyspnea or shortness of breath Dermatological: negative for rash Respiratory: negative for cough or wheezing Urologic: negative for hematuria Abdominal: negative for nausea, vomiting, diarrhea, bright red blood per rectum, melena, or hematemesis Neurologic: negative for visual changes, syncope, or dizziness All other systems reviewed and are otherwise negative except as noted above.    Blood pressure 114/60, pulse 63, height _0  (1.676 m), weight 143 lb (64.9 kg).  General appearance: alert and no distress Neck: no adenopathy, no JVD, supple, symmetrical, trachea midline, thyroid not enlarged, symmetric, no tenderness/mass/nodules and Soft bilateral carotid bruits Lungs: clear to auscultation bilaterally Heart: regular rate and rhythm, S1, S2 normal, no murmur, click, rub or gallop Extremities: extremities normal, atraumatic, no cyanosis or edema Pulses: 2+ and symmetric Skin: Skin color, texture, turgor normal. No rashes or lesions Neurologic: Alert and oriented X 3, normal strength and tone. Normal symmetric reflexes. Normal coordination and gait  EKG sinus rhythm at 63 without ST or T wave changes.  I personally reviewed this EKG  ASSESSMENT AND  PLAN:   Statin intolerance She of hyperlipidemia intolerant to statin therapy.  He is on a study drug through his PCP.  His most recent lipid profile performed 04/05/2018 revealed total cholesterol 200, LDL 130 and HDL of 43.  We will explore the possibility of beginning [place].  S/P CABG x 3 History of coronary artery disease status post stenting remotely and ultimately requiring coronary artery bypass grafting 04/22/2016 by Dr. Cyndia Bent with a LIMA to his OM1, vein to OM 2, and free RIMA to the RCA.  His postop course was uncomplicated.  He denies chest pain or shortness of breath.  Hypertension associated with diabetes ([facility]) History of essential hypertension blood pressure measured today 114/60.  He is on lisinopril and metoprolol.  Continue current meds at current dosing.  PVD (peripheral vascular disease) ([facility]) History of peripheral arterial disease with known occluded right iliac artery and stenting of his left iliac artery by myself 08/22/2016.  He ultimately underwent left to right femorofemoral crossover grafting by Dr. Trula Slade 12/30/2016.  His most recent Dopplers revealed ABIs in the 0.7 range with a patent graft and mild to moderate in-stent restenosis within the left common iliac artery stent.  He denies claudication.  Right-sided carotid artery disease ([place]) History of carotid artery disease with Dopplers performed 12/09/2016 revealing moderate right ICA stenosis which we will repeat.      Lorretta Harp MD FACP,FACC,FAHA, [facility] 06/01/2018 11:31 AM

## 2018-06-01 NOTE — Assessment & Plan Note (Signed)
History of peripheral arterial disease with known occluded right iliac artery and stenting of his left iliac artery by myself 08/22/2016.  He ultimately underwent left to right femorofemoral crossover grafting by Dr. Myra GianottiBrabham 12/30/2016.  His most recent Dopplers revealed ABIs in the 0.7 range with a patent graft and mild to moderate in-stent restenosis within the left common iliac artery stent.  He denies claudication.

## 2018-06-06 ENCOUNTER — Other Ambulatory Visit: Payer: Self-pay | Admitting: Pharmacist Clinician (PhC)/ Clinical Pharmacy Specialist

## 2018-06-06 MED ORDER — EVOLOCUMAB 140 MG/ML ~~LOC~~ SOAJ: 140.0000 mg | SUBCUTANEOUS | 12 refills | Status: DC

## 2018-06-06 NOTE — Telephone Encounter (Signed)
[facility] [place].   Patient to call with cost or if cost prohibitive.    Patient currently involved in CLEAR trial with [employer](Bryan Bray).  They are aware that we are trying to get approval for Repatha and patient is to continue with study drug and all appointments with them.  Patient voiced understanding

## 2018-06-13 ENCOUNTER — Other Ambulatory Visit: Payer: Self-pay | Admitting: Neurology

## 2018-06-14 ENCOUNTER — Telehealth: Payer: Self-pay | Admitting: Pharmacist Clinician (PhC)/ Clinical Pharmacy Specialist

## 2018-06-14 NOTE — Telephone Encounter (Signed)
LMOM for patient.  Repatha approved to 08/01/18.  Need to determine pharmacy copay and if apply for assistance

## 2018-06-15 ENCOUNTER — Other Ambulatory Visit: Payer: Self-pay | Admitting: *Deleted

## 2018-06-15 ENCOUNTER — Ambulatory Visit (HOSPITAL_COMMUNITY)
Admission: RE | Admit: 2018-06-15 | Discharge: 2018-06-15 | Disposition: A | Discharge status: Home / Self Care | Payer: PPO | Source: Ambulatory Visit | Attending: Cardiology | Admitting: Cardiology

## 2018-06-15 DIAGNOSIS — I6523 Occlusion and stenosis of bilateral carotid arteries: Secondary | ICD-10-CM | POA: Insufficient documentation

## 2018-06-21 NOTE — Telephone Encounter (Signed)
Talked to Derrick [name] today.  He will complete [employer] safety Net paperwork and return to clinic ASAP.   Documented mailed to patient as requested.

## 2018-06-21 NOTE — Telephone Encounter (Signed)
Follow up   Pt c/o medication issue:  1. Name of Medication: Evolocumab (REPATHA SURECLICK) 140 MG/ML SOAJ  2. How are you currently taking this medication (dosage and times per day)?   3. Are you having a reaction (difficulty breathing--STAT)? no  4. What is your medication issue? Medication too costly

## 2018-06-22 DIAGNOSIS — G894 Chronic pain syndrome: Secondary | ICD-10-CM | POA: Diagnosis not present

## 2018-06-22 DIAGNOSIS — G629 Polyneuropathy, unspecified: Secondary | ICD-10-CM | POA: Diagnosis not present

## 2018-06-22 DIAGNOSIS — Z79899 Other long term (current) drug therapy: Secondary | ICD-10-CM | POA: Diagnosis not present

## 2018-07-25 ENCOUNTER — Encounter (INDEPENDENT_AMBULATORY_CARE_PROVIDER_SITE_OTHER): Payer: Self-pay

## 2018-07-25 ENCOUNTER — Encounter (INDEPENDENT_AMBULATORY_CARE_PROVIDER_SITE_OTHER): Payer: Self-pay | Admitting: Family

## 2018-07-25 ENCOUNTER — Ambulatory Visit (INDEPENDENT_AMBULATORY_CARE_PROVIDER_SITE_OTHER): Payer: PPO | Admitting: Family

## 2018-07-25 DIAGNOSIS — I739 Peripheral vascular disease, unspecified: Secondary | ICD-10-CM | POA: Diagnosis not present

## 2018-07-25 DIAGNOSIS — B351 Tinea unguium: Secondary | ICD-10-CM | POA: Diagnosis not present

## 2018-07-25 DIAGNOSIS — E118 Type 2 diabetes mellitus with unspecified complications: Secondary | ICD-10-CM

## 2018-07-25 DIAGNOSIS — Z794 Long term (current) use of insulin: Secondary | ICD-10-CM | POA: Diagnosis not present

## 2018-07-25 NOTE — Progress Notes (Signed)
Office Visit Note   Patient: Derrick [name]           Date of Birth: [birth year]           MRN: [id number] Visit Date: 07/25/2018              Requested by: Merri BrunettePharr, Walter, MD [address] [place], [place] [id number] PCP: Merri BrunettePharr, Walter, MD  No chief complaint on file.     HPI: The patient is a 72 year old gentleman who presents today complaining of painful ingrown nails x2.  His second toe on bilateral feet he feels is ingrown and painful pain with ambulation.  States he is unable to safely trim his own nails.  Unable to reach his toenails for trimming.  This is been ongoing for several weeks.  Assessment & Plan: Visit Diagnoses:  1. Onychomycosis   2. PVD (peripheral vascular disease) (HCC)   3. Type 2 diabetes mellitus with complication, with long-term current use of insulin (HCC)     Plan: Nails were trimmed today x10 without incident.  Patient voiced relief.  He will follow-up in the office as  Follow-Up Instructions: Return in about 3 months (around 10/25/2018), or if symptoms worsen or fail to improve.   Ortho Exam  Patient is alert, oriented, no adenopathy, well-dressed, normal affect, normal respiratory effort. On examination of bilateral feet has thickened and onychomycotic nails x10 the second toenails are long and curled under. Nails are not ingrown. No erythema no drainage. No sign of infection.   Imaging: No results found. No images are attached to the encounter.  Labs: Lab Results  Component Value Date   HGBA1C 6.7 (H) 12/30/2016   HGBA1C 7.4 (H) 04/19/2016   HGBA1C 8.6 (H) 03/09/2015     Lab Results  Component Value Date   ALBUMIN 3.8 12/27/2016   ALBUMIN 4.0 04/19/2016   ALBUMIN 4.3 08/02/2013    There is no height or weight on file to calculate BMI.  Orders:  No orders of the defined types were placed in this encounter.  No orders of the defined types were placed in this encounter.    Procedures: No procedures  performed  Clinical Data: No additional findings.  ROS:  All other systems negative, except as noted in the HPI. Review of Systems  Objective: Vital Signs: There were no vitals taken for this visit.  Specialty Comments:  No specialty comments available.  PMFS History: Patient Active Problem List   Diagnosis Date Noted  . Atherosclerosis of native arteries of extremities with intermittent claudication, right leg (HCC) 12/30/2016  . Right-sided carotid artery disease (HCC) 11/08/2016  . S/P arterial stent, 08/22/16 Lt [facility] PTA/Stent  08/23/2016  . PVD (peripheral vascular disease) (HCC) 05/16/2016  . Chronic renal insufficiency, stage III (moderate) (HCC) 05/16/2016  . S/P CABG x 3 04/22/2016  . Abnormal stress test   . Claudication (HCC)   . Preventative health care 07/02/2014  . Hypertension associated with diabetes (HCC) 12/03/2013  . Normocytic anemia 12/03/2013  . Health maintenance examination 08/16/2013  . Systolic murmur 08/16/2013  . Diabetes mellitus with complication, with long-term current use of insulin (HCC) 11/15/2012  . Prostate cancer screening 03/14/2012  . Actinic keratosis 03/09/2011  . Coronary atherosclerosis 01/25/2011  . PAIN IN SOFT TISSUES OF LIMB 11/08/2010  . Statin intolerance 04/30/2009  . Acute myocardial infarction, subendocardial infarction (HCC) 04/30/2009  . COPD 04/29/2009   Past Medical History:  Diagnosis Date  . Anginal pain (HCC)  occ   . Chronic renal insufficiency, stage III (moderate) (HCC) 2013   CrCl est high 50s (borderline stage II/III pt denies 12/27/16  . COPD (chronic obstructive pulmonary disease) (HCC)   . Coronary atherosclerosis of native coronary vessel    S/P NSTEMI 04/11/2009--DEL stent to LAD.  Nuclear stress test 11/2010 NEG, EF normal  . Diabetic peripheral neuropathy (HCC)    Painful; 1 vicodin bid very helpful (pt resistant to alternative tx's)  . Early cataracts, bilateral 04/24/2012   [name] [place]  . GERD (gastroesophageal reflux disease)   . Heart murmur    patient denies  . Herpes zoster    Two separate outbreaks--one on left axillary/back region, one on right axillary/back region  . Hyperlipidemia   . Hypertension   . Mitral regurgitation 08/2013   Moderate (echo)  . Myocardial infarction (HCC) 2010  . Peripheral arterial disease (HCC)   . S/P arterial stent, 08/22/16 Lt [facility] PTA/Stent  08/23/2016  . Type II diabetes mellitus (HCC)     Family History  Problem Relation Age of Onset  . Cancer Mother        brain  . Diabetes Father   . Heart disease Father   . Diabetes Brother   . Heart disease Brother   . Diabetes Son   . Hyperlipidemia Son   . Coronary artery disease Unknown        family hx of    Past Surgical History:  Procedure Laterality Date  . ANTERIOR CERVICAL DECOMP/DISCECTOMY FUSION  2004 X 2   "1st one collapsed; had to go in 1 month later & do the front and back"  . BACK SURGERY    . CARDIAC CATHETERIZATION N/A 03/28/2016   Procedure: Left Heart Cath and Coronary Angiography;  Surgeon: Runell GessJonathan J Berry, MD;  [facility];  Service: Cardiovascular;  Laterality: N/A;  . CARDIOVASCULAR STRESS TEST  11/2010   No ischemia/normal EF  . CARPAL TUNNEL RELEASE Left 05/28/2015   Procedure: LEFT HAND CARPAL TUNNEL RELEASE;  Surgeon: Bradly BienenstockFred Ortmann, MD;  [facility] ;  Service: Orthopedics;  Laterality: Left;  . COLONOSCOPY  12/2010   Normal screening colonoscopy; rpt 10 yrs  . CORONARY ANGIOPLASTY WITH STENT PLACEMENT  04/2009   DES to LAD  . CORONARY ARTERY BYPASS GRAFT N/A 04/22/2016   Procedure: CORONARY ARTERY BYPASS GRAFTING (CABG) LIMA to OM1 SVG to OM2 FREE RIMA to RCA  ENDOSCOPIC HARVEST GREATER SAPHENOUS VEIN -Right Thigh;  Surgeon: Alleen BorneBryan K Bartle, MD;  Location: [facility] OR;  Service: Open Heart Surgery;  Laterality: N/A;  . cryotherapy to AK lesion on nose  01/2010  . eye exam,diabetic  02/2011   Normal diabetic  retinopathy screening [facility] [place].  [name]. FEMORAL-FEMORAL BYPASS GRAFT Bilateral 12/30/2016   Procedure: BYPASS GRAFT LEFT FEMORAL-RIGHT FEMORAL ARTERY;  Surgeon: Nada LibmanVance W Brabham, MD;  [facility];  Service: Vascular;  Laterality: Bilateral;  . ILIAC VEIN ANGIOPLASTY / STENTING  08/22/2016   Abdominal aortogram, bilateral iliac angiogram, bifemoral runoff  . LOWER EXTREMITY ANGIOGRAM  10/13/2016  . LUMBAR FUSION  2002   "put screws in"  . PERIPHERAL VASCULAR CATHETERIZATION N/A 03/28/2016   Procedure: Abdominal Aortogram;  Surgeon: Runell GessJonathan J Berry, MD;  [facility];  Service: Cardiovascular;  Laterality: N/A;  . PERIPHERAL VASCULAR CATHETERIZATION Bilateral 08/22/2016   Procedure: Lower Extremity Angiography;  Surgeon: Runell GessJonathan J Berry, MD;  [facility];  Service: Cardiovascular;  Laterality: Bilateral;  . PERIPHERAL VASCULAR CATHETERIZATION Left 08/22/2016   Procedure: Peripheral Vascular Intervention;  Surgeon: Runell Gess, MD;  Location: [facility] INVASIVE CV LAB;  Service: Cardiovascular;  Laterality: Left;  COMMON ILIAC  . PERIPHERAL VASCULAR CATHETERIZATION Bilateral 10/13/2016   Procedure: Lower Extremity Angiography;  Surgeon: Runell Gess, MD;  Location: [facility] INVASIVE CV LAB;  Service: Cardiovascular;  Laterality: Bilateral;  . PERIPHERAL VASCULAR CATHETERIZATION Right 10/13/2016   Procedure: Peripheral Vascular Balloon Angioplasty;  Surgeon: Runell Gess, MD;  [facility] INVAS[facility] CV LAB;  Service: Cardiovascular;  Laterality: Right;  Right Common ILIac  . POSTERIOR FUSION CERVICAL SPINE  2004  . TEE WITHOUT CARDIOVERSION N/A 04/22/2016   Procedure: TRANSESOPHAGEAL ECHOCARDIOGRAM (TEE);  Surgeon: Alleen Borne, MD;  [facility] OR;  Service: Open Heart Surgery;  Laterality: N/A;  . TRANSTHORACIC ECHOCARDIOGRAM  08/2013   LVH, EF 55-65%, no WMA.  Moderate mitral regurg   Social History   Occupational History  . Not on file   Tobacco Use  . Smoking status: Former Smoker    Packs/day: 1.00    Years: 50.00    Pack years: 50.00    Types: Cigarettes    Last attempt to quit: 01/06/2009    Years since quitting: 9.5  . Smokeless tobacco: Never Used  Substance and Sexual Activity  . Alcohol use: No    Alcohol/week: 0.0 standard drinks    Comment: "drank some in high school"  . Drug use: No  . Sexual activity: Never

## 2018-08-08 DIAGNOSIS — E1140 Type 2 diabetes mellitus with diabetic neuropathy, unspecified: Secondary | ICD-10-CM | POA: Diagnosis not present

## 2018-08-08 DIAGNOSIS — I10 Essential (primary) hypertension: Secondary | ICD-10-CM | POA: Diagnosis not present

## 2018-08-21 DIAGNOSIS — E1140 Type 2 diabetes mellitus with diabetic neuropathy, unspecified: Secondary | ICD-10-CM | POA: Diagnosis not present

## 2018-08-24 DIAGNOSIS — G894 Chronic pain syndrome: Secondary | ICD-10-CM | POA: Diagnosis not present

## 2018-08-24 DIAGNOSIS — G629 Polyneuropathy, unspecified: Secondary | ICD-10-CM | POA: Diagnosis not present

## 2018-08-24 DIAGNOSIS — Z79899 Other long term (current) drug therapy: Secondary | ICD-10-CM | POA: Diagnosis not present

## 2018-09-05 ENCOUNTER — Telehealth: Payer: Self-pay | Admitting: Pharmacist Clinician (PhC)/ Clinical Pharmacy Specialist

## 2018-09-05 NOTE — Telephone Encounter (Signed)
Patient approved for Repatha, however copay was cost prohibitive and patient did not qualify for Safety Net (apparently he qualifies for LIS).  Patient now in research.

## 2018-09-21 DIAGNOSIS — G629 Polyneuropathy, unspecified: Secondary | ICD-10-CM | POA: Diagnosis not present

## 2018-09-21 DIAGNOSIS — Z79899 Other long term (current) drug therapy: Secondary | ICD-10-CM | POA: Diagnosis not present

## 2018-09-21 DIAGNOSIS — G894 Chronic pain syndrome: Secondary | ICD-10-CM | POA: Diagnosis not present

## 2018-09-26 DIAGNOSIS — E119 Type 2 diabetes mellitus without complications: Secondary | ICD-10-CM | POA: Diagnosis not present

## 2018-09-26 DIAGNOSIS — R197 Diarrhea, unspecified: Secondary | ICD-10-CM | POA: Diagnosis not present

## 2018-09-26 DIAGNOSIS — Z794 Long term (current) use of insulin: Secondary | ICD-10-CM | POA: Diagnosis not present

## 2018-11-11 ENCOUNTER — Other Ambulatory Visit: Payer: Self-pay | Admitting: Cardiovascular Disease

## 2018-11-14 DIAGNOSIS — I252 Old myocardial infarction: Secondary | ICD-10-CM | POA: Diagnosis not present

## 2018-11-14 DIAGNOSIS — E7800 Pure hypercholesterolemia, unspecified: Secondary | ICD-10-CM | POA: Diagnosis not present

## 2018-11-14 DIAGNOSIS — I10 Essential (primary) hypertension: Secondary | ICD-10-CM | POA: Diagnosis not present

## 2018-11-14 DIAGNOSIS — E1140 Type 2 diabetes mellitus with diabetic neuropathy, unspecified: Secondary | ICD-10-CM | POA: Diagnosis not present

## 2018-11-16 DIAGNOSIS — G894 Chronic pain syndrome: Secondary | ICD-10-CM | POA: Diagnosis not present

## 2018-11-16 DIAGNOSIS — G629 Polyneuropathy, unspecified: Secondary | ICD-10-CM | POA: Diagnosis not present

## 2018-11-16 DIAGNOSIS — Z79899 Other long term (current) drug therapy: Secondary | ICD-10-CM | POA: Diagnosis not present

## 2018-11-30 DIAGNOSIS — E1140 Type 2 diabetes mellitus with diabetic neuropathy, unspecified: Secondary | ICD-10-CM | POA: Diagnosis not present

## 2018-12-06 DIAGNOSIS — E1142 Type 2 diabetes mellitus with diabetic polyneuropathy: Secondary | ICD-10-CM | POA: Diagnosis not present

## 2018-12-06 DIAGNOSIS — B351 Tinea unguium: Secondary | ICD-10-CM | POA: Diagnosis not present

## 2018-12-06 DIAGNOSIS — M79676 Pain in unspecified toe(s): Secondary | ICD-10-CM | POA: Diagnosis not present

## 2018-12-06 DIAGNOSIS — L84 Corns and callosities: Secondary | ICD-10-CM | POA: Diagnosis not present

## 2018-12-20 ENCOUNTER — Other Ambulatory Visit: Payer: Self-pay | Admitting: Cardiovascular Disease

## 2018-12-20 NOTE — Telephone Encounter (Signed)
Rx request sent to pharmacy.  

## 2018-12-26 DIAGNOSIS — Z0000 Encounter for general adult medical examination without abnormal findings: Secondary | ICD-10-CM | POA: Diagnosis not present

## 2018-12-26 DIAGNOSIS — E1140 Type 2 diabetes mellitus with diabetic neuropathy, unspecified: Secondary | ICD-10-CM | POA: Diagnosis not present

## 2018-12-26 DIAGNOSIS — E7800 Pure hypercholesterolemia, unspecified: Secondary | ICD-10-CM | POA: Diagnosis not present

## 2018-12-26 DIAGNOSIS — I10 Essential (primary) hypertension: Secondary | ICD-10-CM | POA: Diagnosis not present

## 2018-12-26 DIAGNOSIS — I252 Old myocardial infarction: Secondary | ICD-10-CM | POA: Diagnosis not present

## 2019-01-17 DIAGNOSIS — Z79899 Other long term (current) drug therapy: Secondary | ICD-10-CM | POA: Diagnosis not present

## 2019-01-17 DIAGNOSIS — G894 Chronic pain syndrome: Secondary | ICD-10-CM | POA: Diagnosis not present

## 2019-01-17 DIAGNOSIS — G629 Polyneuropathy, unspecified: Secondary | ICD-10-CM | POA: Diagnosis not present

## 2019-01-28 DIAGNOSIS — E1140 Type 2 diabetes mellitus with diabetic neuropathy, unspecified: Secondary | ICD-10-CM | POA: Diagnosis not present

## 2019-02-06 DIAGNOSIS — I10 Essential (primary) hypertension: Secondary | ICD-10-CM | POA: Diagnosis not present

## 2019-02-06 DIAGNOSIS — E7800 Pure hypercholesterolemia, unspecified: Secondary | ICD-10-CM | POA: Diagnosis not present

## 2019-02-06 DIAGNOSIS — E1140 Type 2 diabetes mellitus with diabetic neuropathy, unspecified: Secondary | ICD-10-CM | POA: Diagnosis not present

## 2019-02-26 ENCOUNTER — Ambulatory Visit
Admission: RE | Admit: 2019-02-26 | Discharge: 2019-02-26 | Disposition: A | Discharge status: Home / Self Care | Payer: PPO | Source: Ambulatory Visit | Attending: Acute Care | Admitting: Acute Care

## 2019-02-26 DIAGNOSIS — Z136 Encounter for screening for cardiovascular disorders: Secondary | ICD-10-CM | POA: Diagnosis not present

## 2019-02-26 DIAGNOSIS — Z87891 Personal history of nicotine dependence: Secondary | ICD-10-CM

## 2019-02-26 DIAGNOSIS — Z122 Encounter for screening for malignant neoplasm of respiratory organs: Secondary | ICD-10-CM

## 2019-02-28 DIAGNOSIS — E1140 Type 2 diabetes mellitus with diabetic neuropathy, unspecified: Secondary | ICD-10-CM | POA: Diagnosis not present

## 2019-02-28 DIAGNOSIS — Z794 Long term (current) use of insulin: Secondary | ICD-10-CM | POA: Diagnosis not present

## 2019-03-01 ENCOUNTER — Other Ambulatory Visit: Payer: Self-pay | Admitting: Acute Care

## 2019-03-01 DIAGNOSIS — Z122 Encounter for screening for malignant neoplasm of respiratory organs: Secondary | ICD-10-CM

## 2019-03-01 DIAGNOSIS — Z87891 Personal history of nicotine dependence: Secondary | ICD-10-CM

## 2019-03-18 DIAGNOSIS — Z79899 Other long term (current) drug therapy: Secondary | ICD-10-CM | POA: Diagnosis not present

## 2019-03-18 DIAGNOSIS — G894 Chronic pain syndrome: Secondary | ICD-10-CM | POA: Diagnosis not present

## 2019-03-18 DIAGNOSIS — G629 Polyneuropathy, unspecified: Secondary | ICD-10-CM | POA: Diagnosis not present

## 2019-03-20 ENCOUNTER — Encounter (HOSPITAL_COMMUNITY): Payer: PPO

## 2019-03-20 ENCOUNTER — Other Ambulatory Visit: Payer: Self-pay | Admitting: Cardiovascular Disease

## 2019-03-20 NOTE — Telephone Encounter (Signed)
Clopidogrel refilled

## 2019-03-31 DIAGNOSIS — E1140 Type 2 diabetes mellitus with diabetic neuropathy, unspecified: Secondary | ICD-10-CM | POA: Diagnosis not present

## 2019-03-31 DIAGNOSIS — Z794 Long term (current) use of insulin: Secondary | ICD-10-CM | POA: Diagnosis not present

## 2019-04-02 DIAGNOSIS — M79676 Pain in unspecified toe(s): Secondary | ICD-10-CM | POA: Diagnosis not present

## 2019-04-02 DIAGNOSIS — B351 Tinea unguium: Secondary | ICD-10-CM | POA: Diagnosis not present

## 2019-04-02 DIAGNOSIS — E1142 Type 2 diabetes mellitus with diabetic polyneuropathy: Secondary | ICD-10-CM | POA: Diagnosis not present

## 2019-04-02 DIAGNOSIS — L84 Corns and callosities: Secondary | ICD-10-CM | POA: Diagnosis not present

## 2019-04-03 DIAGNOSIS — I10 Essential (primary) hypertension: Secondary | ICD-10-CM | POA: Diagnosis not present

## 2019-04-03 DIAGNOSIS — E7800 Pure hypercholesterolemia, unspecified: Secondary | ICD-10-CM | POA: Diagnosis not present

## 2019-04-03 DIAGNOSIS — E1140 Type 2 diabetes mellitus with diabetic neuropathy, unspecified: Secondary | ICD-10-CM | POA: Diagnosis not present

## 2019-04-17 ENCOUNTER — Other Ambulatory Visit: Payer: Self-pay | Admitting: Cardiovascular Disease

## 2019-04-17 DIAGNOSIS — I739 Peripheral vascular disease, unspecified: Secondary | ICD-10-CM

## 2019-04-17 DIAGNOSIS — Z95820 Peripheral vascular angioplasty status with implants and grafts: Secondary | ICD-10-CM

## 2019-04-19 ENCOUNTER — Other Ambulatory Visit (HOSPITAL_COMMUNITY): Payer: Self-pay | Admitting: Cardiovascular Disease

## 2019-04-19 ENCOUNTER — Ambulatory Visit (HOSPITAL_COMMUNITY)
Admission: RE | Admit: 2019-04-19 | Discharge: 2019-04-19 | Disposition: A | Discharge status: Home / Self Care | Payer: Medicare Other | Source: Ambulatory Visit | Attending: Cardiovascular Disease | Admitting: Cardiovascular Disease

## 2019-04-19 ENCOUNTER — Other Ambulatory Visit: Payer: Self-pay

## 2019-04-19 ENCOUNTER — Ambulatory Visit (HOSPITAL_BASED_OUTPATIENT_CLINIC_OR_DEPARTMENT_OTHER)
Admission: RE | Admit: 2019-04-19 | Discharge: 2019-04-19 | Disposition: A | Discharge status: Home / Self Care | Payer: Medicare Other | Source: Ambulatory Visit | Attending: Cardiovascular Disease | Admitting: Cardiovascular Disease

## 2019-04-19 DIAGNOSIS — I739 Peripheral vascular disease, unspecified: Secondary | ICD-10-CM | POA: Diagnosis not present

## 2019-04-19 DIAGNOSIS — Z95820 Peripheral vascular angioplasty status with implants and grafts: Secondary | ICD-10-CM | POA: Insufficient documentation

## 2019-04-30 DIAGNOSIS — Z794 Long term (current) use of insulin: Secondary | ICD-10-CM | POA: Diagnosis not present

## 2019-04-30 DIAGNOSIS — E1140 Type 2 diabetes mellitus with diabetic neuropathy, unspecified: Secondary | ICD-10-CM | POA: Diagnosis not present

## 2019-05-07 ENCOUNTER — Telehealth: Payer: Self-pay | Admitting: Cardiovascular Disease

## 2019-05-07 ENCOUNTER — Telehealth: Payer: Self-pay

## 2019-05-07 NOTE — Telephone Encounter (Signed)
Spoke with pt and informed that Dr. Allyson Sabal would like to review recent doppler study results with him via virtual visit. Appt rescheduled for 05/08/2019 at 10:30am. Pt agreeable. Pt states he gave consent for virtual already.

## 2019-05-07 NOTE — Telephone Encounter (Signed)
smartphone/ consent/ my chart via text/ pre reg completed  °

## 2019-05-08 ENCOUNTER — Telehealth: Payer: Self-pay

## 2019-05-08 ENCOUNTER — Telehealth (INDEPENDENT_AMBULATORY_CARE_PROVIDER_SITE_OTHER): Payer: Medicare Other | Admitting: Cardiovascular Disease

## 2019-05-08 ENCOUNTER — Encounter: Payer: Self-pay | Admitting: Cardiovascular Disease

## 2019-05-08 VITALS — Ht 66.0 in | Wt 150.0 lb

## 2019-05-08 DIAGNOSIS — I10 Essential (primary) hypertension: Secondary | ICD-10-CM

## 2019-05-08 DIAGNOSIS — Z959 Presence of cardiac and vascular implant and graft, unspecified: Secondary | ICD-10-CM | POA: Diagnosis not present

## 2019-05-08 DIAGNOSIS — I70211 Atherosclerosis of native arteries of extremities with intermittent claudication, right leg: Secondary | ICD-10-CM | POA: Diagnosis not present

## 2019-05-08 DIAGNOSIS — E1159 Type 2 diabetes mellitus with other circulatory complications: Secondary | ICD-10-CM | POA: Diagnosis not present

## 2019-05-08 DIAGNOSIS — I739 Peripheral vascular disease, unspecified: Secondary | ICD-10-CM

## 2019-05-08 DIAGNOSIS — E785 Hyperlipidemia, unspecified: Secondary | ICD-10-CM

## 2019-05-08 DIAGNOSIS — I2510 Atherosclerotic heart disease of native coronary artery without angina pectoris: Secondary | ICD-10-CM | POA: Diagnosis not present

## 2019-05-08 DIAGNOSIS — Z79899 Other long term (current) drug therapy: Secondary | ICD-10-CM

## 2019-05-08 DIAGNOSIS — Z951 Presence of aortocoronary bypass graft: Secondary | ICD-10-CM

## 2019-05-08 DIAGNOSIS — Z789 Other specified health status: Secondary | ICD-10-CM

## 2019-05-08 NOTE — Telephone Encounter (Signed)
Patient and/or DPR-approved person aware of 05/08/19 AVS instructions and verbalized understanding. Letter including After Visit Summary and any other necessary documents to be mailed to the patient's address on file.  

## 2019-05-08 NOTE — Patient Instructions (Signed)
Medication Instructions:  Your physician recommends that you continue on your current medications as directed. Please refer to the Current Medication list given to you today.  If you need a refill on your cardiac medications before your next appointment, please call your pharmacy.   Lab work: Your physician recommends that you return for lab work within 1-2 weeks: FASTING LIPID PROFILE AND LIVER FUNCTION TEST. YOU WILL RECEIVE A LAB SLIP IN THE MAIL. PLEASE DO NOT EAT OR DRINK (EXCEPT WATER) ANYTHING AFTER MIDNIGHT ON THE DAY YOU CHOOSE TO PRESENT FOR LAB WORK. YOU MAY EAT AFTER YOUR BLOOD HAS BEEN COLLECTED. NO APPOINTMENT IS NEEDED.  If you have labs (blood work) drawn today and your tests are completely normal, you will receive your results only by: [name] MyChart Message (if you have MyChart) OR . A paper copy in the mail If you have any lab test that is abnormal or we need to change your treatment, we will call you to review the results.  Testing/Procedures: Your physician has requested that you have a lower or upper extremity arterial duplex. This test is an ultrasound of the arteries in the legs or arms. It looks at arterial blood flow in the legs and arms. Allow one hour for Lower and Upper Arterial scans. There are no restrictions or special instructions. TO BE SCHEDULED FOR November 2020. YOU WILL BE CONTACTED BY A SCHEDULER TO SET UP THIS APPOINTMENT.  Your physician has requested that you have an ankle brachial index (ABI). During this test an ultrasound and blood pressure cuff are used to evaluate the arteries that supply the arms and legs with blood. Allow thirty minutes for this exam. There are no restrictions or special instructions. TO BE SCHEDULED FOR November 2020. YOU WILL BE CONTACTED BY A SCHEDULER TO SET UP THIS APPOINTMENT.   Follow-Up: At [facility], you and your health needs are our priority.  As part of our continuing mission to provide you with exceptional heart care,  we have created designated Provider Care Teams.  These Care Teams include your primary Cardiologist (physician) and Advanced Practice Providers (APPs -  Physician Assistants and Nurse Practitioners) who all work together to provide you with the care you need, when you need it. You will need a follow up appointment in 6 months (December 2020) with Dr. Allyson Sabal.  Please call our office 2 months in advance to schedule this appointment.

## 2019-05-08 NOTE — Telephone Encounter (Signed)
Left message for patient, it looks like those dopplers are for the future not now. Will check with chima, dr berry's nurse to make sure. We will let him know.

## 2019-05-08 NOTE — Telephone Encounter (Signed)
New Message   Patient has orders to have US's done but states he already had a few done and wants a nurse to call him to see if he absolutely needs to have them done again.  Please call to discuss reasoning for US's.

## 2019-05-08 NOTE — Progress Notes (Signed)
Virtual Visit via Video Note   This visit type was conducted due to national recommendations for restrictions regarding the COVID-19 Pandemic (e.g. social distancing) in an effort to limit this patient's exposure and mitigate transmission in our community.  Due to his co-morbid illnesses, this patient is at least at moderate risk for complications without adequate follow up.  This format is felt to be most appropriate for this patient at this time.  All issues noted in this document were discussed and addressed.  A limited physical exam was performed with this format.  Please refer to the patient's chart for his consent to telehealth for [facility].   Date:  05/08/2019   ID:  Derrick [name], DOB Apr 12, 1946, MRN [id number]  Patient Location: Home Provider Location: Home  PCP:  Merri Brunette, MD  Cardiologist: Dr. Nanetta Batty Electrophysiologist:  None   Evaluation Performed:  Follow-Up Visit  Chief Complaint: Follow-up CAD and PAD  History of Present Illness:    Derrick [name] is a 73 y.o.  thin and fit-appearing married Caucasian male father of 3, grandfather to 3 grandchildren who I last saw in the office  06/01/2018.[name] He was referred by Dr. Renne Crigler for cardiovascular evaluation because of a prior history of coronary stenting. Factors include treated diabetes and hyperlipidemia intolerant to statin therapy. He smoked 45 pack years and quit at the time of his myocardial infarction in 2010. He does have a family history of heart disease with a father and brother both of whom had coronary artery bypass grafting. He'll myocardial infarction back in 2010 and had stenting performed by Dr. Juanda Chance. He does get occasional atypical chest pain every other month. There also is a question of moderate mitral regurgitation. A 2-D echocardiogram showed normal LV function with mild MR. The Myoview stress test which was read as intermediate risk with inferior and lateral ischemia. Based on this, the  patient was referred for cardiac catheterization to define his anatomy. It should also be noted the patient has been having right hip pain thought to be arthritic for many years. I performed cardiac catheterization on him 03/28/16 revealing a patent proximal LAD stent, 95% ostial circumflex, 70% mid AV groove circumflex a long 70% proximal and mid dominant RCA stenosis. I thought his anatomy was most suited for coronary artery bypass grafting which was performed by Dr. Laneta Simmers on 04/22/16. The LIMA graft to his OM1, vein to OM 2 and a free RIMA to the RCA. His postoperative course was uncomplicated. He is back to work now denying chest pain or shortness of breath. His major issue now is bilateral hip and calf claudication which is lifestyle limiting. He presents today for angiography and potential right common iliac artery PTA and stenting for chronic total occlusion. He had left common iliac artery stenting 08/22/16 successfully improving his left lower extremity . I attempted to percutaneously recanalized his right common iliac chronic total occlusion of 10/13/16 unsuccessfully. He continues to have lifestyle limiting right hip claudication. We talked about options and the patient desires to proceed with surgical revascularization. I'm referred him to Dr. Myra Gianotti for consideration of left to right femorofemoral crossover grafting which was performed on 12/30/16 successfully. He had a postop visit with Dr. Myra Gianotti one month later and was released back to my care after that. He says his claudication is somewhat improved.Recent Dopplers performed 03/09/2018 showed increased velocities within his left common iliac artery stent and of the proximal anastomosis of the graft although he continues to deny  claudication. Since I saw him a year ago he is remained stable denying chest pain, shortness of breath or claudication.  He did have lower extremity arterial Doppler studies performed 04/19/2019 that showed slight worsening of  his ABIs.  He had a high-frequency signal in his left common iliac artery stent with a patent left to right femorofemoral crossover graft.  He continues to complain of right hip pain.  I am unsure whether this is arthritic or vascular nature.  The patient does not have symptoms concerning for COVID-19 infection (fever, chills, cough, or new shortness of breath).    Past Medical History:  Diagnosis Date   Anginal pain (HCC)    occ    Chronic renal insufficiency, stage III (moderate) (HCC) 2013   CrCl est high 50s (borderline stage II/III pt denies 12/27/16   COPD (chronic obstructive pulmonary disease) (HCC)    Coronary atherosclerosis of native coronary vessel    S/P NSTEMI 04/11/2009--DEL stent to LAD.  Nuclear stress test 11/2010 NEG, EF normal   Diabetic peripheral neuropathy (HCC)    Painful; 1 vicodin bid very helpful (pt resistant to alternative tx's)   Early cataracts, bilateral 04/24/2012   [name] in [place], [place]   GERD (gastroesophageal reflux disease)    Heart murmur    patient denies   Herpes zoster    Two separate outbreaks--one on left axillary/back region, one on right axillary/back region   Hyperlipidemia    Hypertension    Mitral regurgitation 08/2013   Moderate (echo)   Myocardial infarction (HCC) 2010   Peripheral arterial disease (HCC)    S/P arterial stent, 08/22/16 Lt CIA PTA/Stent  08/23/2016   Type II diabetes mellitus (HCC)    Past Surgical History:  Procedure Laterality Date   ANTERIOR CERVICAL DECOMP/DISCECTOMY FUSION  2004 X 2   "1st one collapsed; had to go in 1 month later & do the front and back"   BACK SURGERY     CARDIAC CATHETERIZATION N/A 03/28/2016   Procedure: Left Heart Cath and Coronary Angiography;  Surgeon: Derrick Gess, MD;  Location: [facility] INVASIVE CV LAB;  Service: Cardiovascular;  Laterality: N/A;   CARDIOVASCULAR STRESS TEST  11/2010   No ischemia/normal EF   CARPAL TUNNEL RELEASE Left 05/28/2015   Procedure:  LEFT HAND CARPAL TUNNEL RELEASE;  Surgeon: Bradly Bienenstock, MD;  Location: [facility];  Service: Orthopedics;  Laterality: Left;   COLONOSCOPY  12/2010   Normal screening colonoscopy; rpt 10 yrs   CORONARY ANGIOPLASTY WITH STENT PLACEMENT  04/2009   DES to LAD   CORONARY ARTERY BYPASS GRAFT N/A 04/22/2016   Procedure: CORONARY ARTERY BYPASS GRAFTING (CABG) LIMA to OM1 SVG to OM2 FREE RIMA to RCA  ENDOSCOPIC HARVEST GREATER SAPHENOUS VEIN -Right Thigh;  Surgeon: Alleen Borne, MD;  Location: [facility] OR;  Service: Open Heart Surgery;  Laterality: N/A;   cryotherapy to AK lesion on nose  01/2010   eye exam,diabetic  02/2011   Normal diabetic retinopathy screening exam at [facility] in [place], [place].   FEMORAL-FEMORAL BYPASS GRAFT Bilateral 12/30/2016   Procedure: BYPASS GRAFT LEFT FEMORAL-RIGHT FEMORAL ARTERY;  Surgeon: Nada Libman, MD;  Location[facility] OR;  Service: Vascular;  Laterality: Bilateral;   ILIAC VEIN ANGIOPLASTY / STENTING  08/22/2016   Abdominal aortogram, bilateral iliac angiogram, bifemoral runoff   LOWER EXTREMITY ANGIOGRAM  10/13/2016   LUMBAR FUSION  2002   "put screws in"   PERIPHERAL VASCULAR CATHETERIZATION N/A 03/28/2016  Procedure: Abdominal Aortogram;  Surgeon: Derrick Gess, MD;  Location: [facility] INVASIVE CV LAB;  Service: Cardiovascular;  Laterality: N/A;   PERIPHERAL VASCULAR CATHETERIZATION Bilateral 08/22/2016   Procedure: Lower Extremity Angiography;  Surgeon: Derrick Gess, MD;  Location: [facility] INVASIVE CV LAB;  Service: Cardiovascular;  Laterality: Bilateral;   PERIPHERAL VASCULAR CATHETERIZATION Left 08/22/2016   Procedure: Peripheral Vascular Intervention;  Surgeon: Derrick Gess, MD;  Location: [facility] INVASIVE CV LAB;  Service: Cardiovascular;  Laterality: Left;  COMMON ILIAC   PERIPHERAL VASCULAR CATHETERIZATION Bilateral 10/13/2016   Procedure: Lower Extremity Angiography;  Surgeon: Derrick Gess, MD;  Location: [facility] INVASIVE CV LAB;  Service:  Cardiovascular;  Laterality: Bilateral;   PERIPHERAL VASCULAR CATHETERIZATION Right 10/13/2016   Procedure: Peripheral Vascular Balloon Angioplasty;  Surgeon: Derrick Gess, MD;  Location: [facility] INVASIVE CV LAB;  Service: Cardiovascular;  Laterality: Right;  Right Common ILIac   POSTERIOR FUSION CERVICAL SPINE  2004   TEE WITHOUT CARDIOVERSION N/A 04/22/2016   Procedure: TRANSESOPHAGEAL ECHOCARDIOGRAM (TEE);  Surgeon: Alleen Borne, MD;  Location: [facility] OR;  Service: Open Heart Surgery;  Laterality: N/A;   TRANSTHORACIC ECHOCARDIOGRAM  08/2013   LVH, EF 55-65%, no WMA.  Moderate mitral regurg     No outpatient medications have been marked as taking for the 05/08/19 encounter (Appointment) with Derrick Gess, MD.     Allergies:   Atorvastatin; Lovastatin; Pravastatin; and Simvastatin   Social History   Tobacco Use   Smoking status: Former Smoker    Packs/day: 1.00    Years: 50.00    Pack years: 50.00    Types: Cigarettes    Last attempt to quit: 01/06/2009    Years since quitting: 10.3   Smokeless tobacco: Never Used  Substance Use Topics   Alcohol use: No    Alcohol/week: 0.0 standard drinks    Comment: "drank some in high school"   Drug use: No     Family Hx: The patient's family history includes Cancer in his mother; Coronary artery disease in his unknown relative; Diabetes in his brother, father, and son; Heart disease in his brother and father; Hyperlipidemia in his son.  ROS:   Please see the history of present illness.     All other systems reviewed and are negative.   Prior CV studies:   The following studies were reviewed today:  Lower extremity arterial Doppler studies performed 04/19/2019  Labs/Other Tests and Data Reviewed:    EKG:  No ECG reviewed.  Recent Labs: No results found for requested labs within last 8760 hours.   Recent Lipid Panel Lab Results  Component Value Date/Time   CHOL 227 (H) 07/02/2014 08:24 AM   TRIG 99.0 07/02/2014 08:24 AM    HDL 48.90 07/02/2014 08:24 AM   CHOLHDL 5 07/02/2014 08:24 AM   LDLCALC 158 (H) 07/02/2014 08:24 AM   LDLDIRECT 102.8 03/14/2012 09:50 AM    Wt Readings from Last 3 Encounters:  06/01/18 143 lb (64.9 kg)  04/06/18 144 lb 12.8 oz (65.7 kg)  09/29/17 148 lb (67.1 kg)     Objective:    Vital Signs:  There were no vitals taken for this visit.   VITAL SIGNS:  reviewed GEN:  no acute distress RESPIRATORY:  normal respiratory effort, symmetric expansion NEURO:  alert and oriented x 3, no obvious focal deficit PSYCH:  normal affect  ASSESSMENT & PLAN:    1. Coronary artery disease- history of CAD status post remote stenting by Dr. Dickie La in 2010.  He had CABG by Dr. Laneta SimmersBartle 04/22/2016 with a LIMA to OM1, vein graft OM 2 and a RIMA to the RCA.  He denies chest pain or shortness of breath. 2. Peripheral arterial disease- history of PAD status post left common iliac artery stenting by myself 08/22/2016 with subsequent failed attempt at right common iliac artery CTO recanalization.  I ultimately referred him to Dr. Myra GianottiBrabham for left to right femorofemoral crossover grafting which was performed 12/30/2016.  This resulted in improvement in his claudication.  His most recent Dopplers performed 04/19/2019 revealed only slight worsening of his ABIs compared to his prior study a year ago.  His right ABI is 0.71 and left of 0.67.  He had moderately elevated velocities in his left common iliac artery suggesting possibly in-stent restenosis. 3. Essential hypertension- history of essential hypertension on lisinopril and metoprolol.  He was unable to check his vital signs at home today. 4. Hyperlipidemia- history of hyperlipidemia intolerant to statin therapy currently on Repatha.  His last lipid profile performed 12/26/2018 revealed total cholesterol 234, LDL 130 and HDL 41.  We will recheck a lipid and liver profile.  COVID-19 Education: The signs and symptoms of COVID-19 were discussed with the patient and how  to seek care for testing (follow up with PCP or arrange E-visit).  The importance of social distancing was discussed today.  Time:   Today, I have spent 7 minutes with the patient with telehealth technology discussing the above problems.     Medication Adjustments/Labs and Tests Ordered: Current medicines are reviewed at length with the patient today.  Concerns regarding medicines are outlined above.   Tests Ordered: No orders of the defined types were placed in this encounter.   Medication Changes: No orders of the defined types were placed in this encounter.   Disposition:  Follow up in 6 month(s)  Signed, Nanetta BattyJonathan Leaf Kernodle, MD  05/08/2019 9:04 AM    [facility]

## 2019-05-09 NOTE — Telephone Encounter (Signed)
Left detailed message per DPR stating that Dr. Allyson Sabal requested that pt have repeat US in November 2020 and message would be routed to scheduling

## 2019-05-17 DIAGNOSIS — G894 Chronic pain syndrome: Secondary | ICD-10-CM | POA: Diagnosis not present

## 2019-05-17 DIAGNOSIS — Z79899 Other long term (current) drug therapy: Secondary | ICD-10-CM | POA: Diagnosis not present

## 2019-05-17 DIAGNOSIS — G629 Polyneuropathy, unspecified: Secondary | ICD-10-CM | POA: Diagnosis not present

## 2019-06-04 DIAGNOSIS — Z794 Long term (current) use of insulin: Secondary | ICD-10-CM | POA: Diagnosis not present

## 2019-06-04 DIAGNOSIS — E1140 Type 2 diabetes mellitus with diabetic neuropathy, unspecified: Secondary | ICD-10-CM | POA: Diagnosis not present

## 2019-06-17 ENCOUNTER — Other Ambulatory Visit: Payer: Self-pay | Admitting: Cardiovascular Disease

## 2019-06-18 ENCOUNTER — Telehealth: Payer: Medicare Other | Admitting: Cardiovascular Disease

## 2019-06-25 DIAGNOSIS — B351 Tinea unguium: Secondary | ICD-10-CM | POA: Diagnosis not present

## 2019-06-25 DIAGNOSIS — M79676 Pain in unspecified toe(s): Secondary | ICD-10-CM | POA: Diagnosis not present

## 2019-06-25 DIAGNOSIS — L84 Corns and callosities: Secondary | ICD-10-CM | POA: Diagnosis not present

## 2019-06-25 DIAGNOSIS — E1142 Type 2 diabetes mellitus with diabetic polyneuropathy: Secondary | ICD-10-CM | POA: Diagnosis not present

## 2019-06-27 ENCOUNTER — Other Ambulatory Visit (HOSPITAL_COMMUNITY): Payer: Self-pay | Admitting: Cardiovascular Disease

## 2019-06-27 ENCOUNTER — Ambulatory Visit (HOSPITAL_COMMUNITY)
Admission: RE | Admit: 2019-06-27 | Discharge: 2019-06-27 | Disposition: A | Discharge status: Home / Self Care | Payer: Medicare Other | Source: Ambulatory Visit | Attending: Cardiology | Admitting: Cardiology

## 2019-06-27 ENCOUNTER — Other Ambulatory Visit: Payer: Self-pay

## 2019-06-27 DIAGNOSIS — I6523 Occlusion and stenosis of bilateral carotid arteries: Secondary | ICD-10-CM

## 2019-06-27 DIAGNOSIS — E785 Hyperlipidemia, unspecified: Secondary | ICD-10-CM | POA: Diagnosis not present

## 2019-06-27 LAB — LIPID PANEL
Chol/HDL Ratio: 4.3 ratio (ref 0.0–5.0)
Cholesterol, Total: 187 mg/dL (ref 100–199)
HDL: 43 mg/dL (ref >39)
LDL Calculated: 117 mg/dL — ABNORMAL HIGH (ref 0–99)
Triglycerides: 133 mg/dL (ref 0–149)
VLDL Cholesterol Cal: 27 mg/dL (ref 5–40)

## 2019-06-27 LAB — HEPATIC FUNCTION PANEL
ALT: 18 IU/L (ref 0–44)
AST: 18 IU/L (ref 0–40)
Albumin: 4.4 g/dL (ref 3.7–4.7)
Alkaline Phosphatase: 61 IU/L (ref 39–117)
Bilirubin Total: 0.9 mg/dL (ref 0.0–1.2)
Bilirubin, Direct: 0.2 mg/dL (ref 0.00–0.40)
Total Protein: 6.3 g/dL (ref 6.0–8.5)

## 2019-07-03 DIAGNOSIS — I2510 Atherosclerotic heart disease of native coronary artery without angina pectoris: Secondary | ICD-10-CM | POA: Diagnosis not present

## 2019-07-03 DIAGNOSIS — E7800 Pure hypercholesterolemia, unspecified: Secondary | ICD-10-CM | POA: Diagnosis not present

## 2019-07-03 DIAGNOSIS — I10 Essential (primary) hypertension: Secondary | ICD-10-CM | POA: Diagnosis not present

## 2019-07-03 DIAGNOSIS — E1140 Type 2 diabetes mellitus with diabetic neuropathy, unspecified: Secondary | ICD-10-CM | POA: Diagnosis not present

## 2019-07-03 DIAGNOSIS — Z794 Long term (current) use of insulin: Secondary | ICD-10-CM | POA: Diagnosis not present

## 2019-07-04 DIAGNOSIS — Z794 Long term (current) use of insulin: Secondary | ICD-10-CM | POA: Diagnosis not present

## 2019-07-04 DIAGNOSIS — E1140 Type 2 diabetes mellitus with diabetic neuropathy, unspecified: Secondary | ICD-10-CM | POA: Diagnosis not present

## 2019-07-05 ENCOUNTER — Telehealth: Payer: Medicare Other | Admitting: Cardiovascular Disease

## 2019-07-05 DIAGNOSIS — E119 Type 2 diabetes mellitus without complications: Secondary | ICD-10-CM | POA: Diagnosis not present

## 2019-07-10 ENCOUNTER — Other Ambulatory Visit: Payer: Self-pay

## 2019-07-10 DIAGNOSIS — I6523 Occlusion and stenosis of bilateral carotid arteries: Secondary | ICD-10-CM

## 2019-07-10 DIAGNOSIS — E785 Hyperlipidemia, unspecified: Secondary | ICD-10-CM

## 2019-07-17 DIAGNOSIS — Z760 Encounter for issue of repeat prescription: Secondary | ICD-10-CM | POA: Diagnosis not present

## 2019-07-17 DIAGNOSIS — Z79899 Other long term (current) drug therapy: Secondary | ICD-10-CM | POA: Diagnosis not present

## 2019-07-17 DIAGNOSIS — G629 Polyneuropathy, unspecified: Secondary | ICD-10-CM | POA: Diagnosis not present

## 2019-07-17 DIAGNOSIS — G894 Chronic pain syndrome: Secondary | ICD-10-CM | POA: Diagnosis not present

## 2019-08-04 DIAGNOSIS — Z794 Long term (current) use of insulin: Secondary | ICD-10-CM | POA: Diagnosis not present

## 2019-08-04 DIAGNOSIS — E1140 Type 2 diabetes mellitus with diabetic neuropathy, unspecified: Secondary | ICD-10-CM | POA: Diagnosis not present

## 2019-08-14 DIAGNOSIS — I10 Essential (primary) hypertension: Secondary | ICD-10-CM | POA: Diagnosis not present

## 2019-08-14 DIAGNOSIS — Z955 Presence of coronary angioplasty implant and graft: Secondary | ICD-10-CM | POA: Diagnosis not present

## 2019-08-14 DIAGNOSIS — E7800 Pure hypercholesterolemia, unspecified: Secondary | ICD-10-CM | POA: Diagnosis not present

## 2019-08-14 DIAGNOSIS — E1140 Type 2 diabetes mellitus with diabetic neuropathy, unspecified: Secondary | ICD-10-CM | POA: Diagnosis not present

## 2019-08-29 ENCOUNTER — Other Ambulatory Visit: Payer: Self-pay

## 2019-08-29 ENCOUNTER — Ambulatory Visit
Admission: RE | Admit: 2019-08-29 | Discharge: 2019-08-29 | Disposition: A | Discharge status: Home / Self Care | Payer: Medicare Other | Source: Ambulatory Visit | Attending: Acute Care | Admitting: Acute Care

## 2019-08-29 DIAGNOSIS — Z87891 Personal history of nicotine dependence: Secondary | ICD-10-CM

## 2019-08-29 DIAGNOSIS — J439 Emphysema, unspecified: Secondary | ICD-10-CM | POA: Diagnosis not present

## 2019-08-29 DIAGNOSIS — Z122 Encounter for screening for malignant neoplasm of respiratory organs: Secondary | ICD-10-CM

## 2019-09-03 ENCOUNTER — Encounter: Payer: Self-pay | Admitting: Internal Medicine

## 2019-09-03 ENCOUNTER — Ambulatory Visit (INDEPENDENT_AMBULATORY_CARE_PROVIDER_SITE_OTHER): Payer: Medicare Other | Admitting: Internal Medicine

## 2019-09-03 ENCOUNTER — Other Ambulatory Visit: Payer: Self-pay

## 2019-09-03 VITALS — BP 120/78 | HR 65 | Temp 97.7°F | Ht 66.0 in | Wt 143.0 lb

## 2019-09-03 DIAGNOSIS — Z789 Other specified health status: Secondary | ICD-10-CM | POA: Diagnosis not present

## 2019-09-03 DIAGNOSIS — E785 Hyperlipidemia, unspecified: Secondary | ICD-10-CM

## 2019-09-03 DIAGNOSIS — I739 Peripheral vascular disease, unspecified: Secondary | ICD-10-CM

## 2019-09-03 DIAGNOSIS — Z951 Presence of aortocoronary bypass graft: Secondary | ICD-10-CM

## 2019-09-03 NOTE — Patient Instructions (Signed)
Medication Instructions:  Dr. Debara Pickett recommends that you take Repatha - we will check with your trial to find out if OK to take this If you need a refill on your cardiac medications before your next appointment, please call your pharmacy.   Lab work: FASTING lab work to check cholesterol prior to next lipid clinic appointment If you have labs (blood work) drawn today and your tests are completely normal, you will receive your results only by: [name] MyChart Message (if you have MyChart) OR . A paper copy in the mail If you have any lab test that is abnormal or we need to change your treatment, we will call you to review the results.  Testing/Procedures: NONE  Follow-Up: . Dr. Debara Pickett recommends that you schedule a follow up visit with him the in the [place] in 3-4 months. Please have fasting blood work about 1 week prior to this visit and he will review the blood work results with you at your appointment.   Any Other Special Instructions Will Be Listed Below (If Applicable).  Patient assistance -  -- medicare low income subsidy (LIS) - info on application provided -- healthwellfoundation.org >> disease funds >> hypercholesterolemia (online) OR call [phone]

## 2019-09-04 ENCOUNTER — Telehealth: Payer: Self-pay

## 2019-09-04 MED ORDER — REPATHA SURECLICK 140 MG/ML ~~LOC~~ SOAJ: 140.0000 mg | SUBCUTANEOUS | 12 refills | Status: DC

## 2019-09-04 NOTE — Telephone Encounter (Signed)
Called and lmomed the pt stating that the medication was approved via their insurance and instructed the pt to contact me if he has issues w/cost

## 2019-09-05 ENCOUNTER — Encounter: Payer: Self-pay | Admitting: Internal Medicine

## 2019-09-05 ENCOUNTER — Other Ambulatory Visit: Payer: Self-pay | Admitting: *Deleted

## 2019-09-05 DIAGNOSIS — Z122 Encounter for screening for malignant neoplasm of respiratory organs: Secondary | ICD-10-CM

## 2019-09-05 DIAGNOSIS — Z87891 Personal history of nicotine dependence: Secondary | ICD-10-CM

## 2019-09-05 NOTE — Telephone Encounter (Signed)
Patient is returning call.  °

## 2019-09-05 NOTE — Telephone Encounter (Signed)
Returned call and lmomed the pt instructed him to call back

## 2019-09-05 NOTE — Telephone Encounter (Signed)
Spoke w/pt regarding the repatha and the pt would like the pt assistance forms mailed out to them

## 2019-09-05 NOTE — Progress Notes (Signed)
LIPID CLINIC CONSULT NOTE  Chief Complaint:  Manage hyperlipidemia  Primary Care Physician: Deland Pretty, MD  Primary Cardiologist:  No primary care provider on file.  HPI:  Derrick VALLI is a 73 y.o. male who is being seen today for the evaluation of hyperlipidemia at the request of Lorretta Harp, MD.  This is a pleasant 73 year old male followed by Dr. Alvester Chou with a history of coronary artery disease status post CABG x3, NSTEMI with PCI to the LAD as well as peripheral arterial disease, chronic kidney disease stage III, COPD, type 2 diabetes, hypertension and dyslipidemia.  Most recently underwent a lipid profile which showed total cholesterol 187, triglycerides 133, HDL 43 and LDL 117.  His goal LDL is less than 70.  He does have a history of multiple statin intolerances including atorvastatin, lovastatin, pravastatin and simvastatin.  He was ordered to start on Repatha which is currently on his medication list, however he said he never got the medication.  Although he was approved for it, cost was prohibitive at the time he did not start the medicine.  Subsequently he has been enrolled in a research trial with Sandi Mariscal, Pharm.D. at [facility]  PMHx:  Past Medical History:  Diagnosis Date  . Anginal pain ([place])    occ   . Chronic renal insufficiency, stage III (moderate) ([place]) 2013   CrCl est high 50s (borderline stage II/III pt denies 12/27/16  . COPD (chronic obstructive pulmonary disease) ([place])   . Coronary atherosclerosis of native coronary vessel    S/P NSTEMI 04/11/2009--DEL stent to LAD.  Nuclear stress test 11/2010 NEG, EF normal  . Diabetic peripheral neuropathy (HCC)    Painful; 1 vicodin bid very helpful (pt resistant to alternative tx's)  . Early cataracts, bilateral 04/24/2012   [name] in [place], [place]  . GERD (gastroesophageal reflux disease)   . Heart murmur    patient denies  . Herpes zoster    Two separate outbreaks--one on left  axillary/back region, one on right axillary/back region  . Hyperlipidemia   . Hypertension   . Mitral regurgitation 08/2013   Moderate (echo)  . Myocardial infarction ([place]) 2010  . Peripheral arterial disease ([place])   . S/P arterial stent, 08/22/16 Lt [facility] PTA/Stent  08/23/2016  . Type II diabetes mellitus ([place])     Past Surgical History:  Procedure Laterality Date  . ANTERIOR CERVICAL DECOMP/DISCECTOMY FUSION  2004 X 2   "1st one collapsed; had to go in 1 month later & do the front and back"  . BACK SURGERY    . CARDIAC CATHETERIZATION N/A 03/28/2016   Procedure: Left Heart Cath and Coronary Angiography;  Surgeon: Lorretta Harp, MD;  Location: [facility];  Service: Cardiovascular;  Laterality: N/A;  . CARDIOVASCULAR STRESS TEST  11/2010   No ischemia/normal EF  . CARPAL TUNNEL RELEASE Left 05/28/2015   Procedure: LEFT HAND CARPAL TUNNEL RELEASE;  Surgeon: Iran Planas, MD;  Location: [facility];  Service: Orthopedics;  Laterality: Left;  . COLONOSCOPY  12/2010   Normal screening colonoscopy; rpt 10 yrs  . CORONARY ANGIOPLASTY WITH STENT PLACEMENT  04/2009   DES to LAD  . CORONARY ARTERY BYPASS GRAFT N/A 04/22/2016   Procedure: CORONARY ARTERY BYPASS GRAFTING (CABG) LIMA to OM1 SVG to OM2 FREE RIMA to RCA  ENDOSCOPIC HARVEST GREATER SAPHENOUS VEIN -Right Thigh;  Surgeon: Gaye Pollack, MD;  Location: [facility];  Service: Open Heart Surgery;  Laterality:  N/A;  . cryotherapy to AK lesion on nose  01/2010  . eye exam,diabetic  02/2011   Normal diabetic retinopathy screening exam at [facility] in [place], .  [name] FEMORAL-FEMORAL BYPASS GRAFT Bilateral 12/30/2016   Procedure: BYPASS GRAFT LEFT FEMORAL-RIGHT FEMORAL ARTERY;  Surgeon: Serafina Mitchell, MD;  Location: [facility];  Service: Vascular;  Laterality: Bilateral;  . ILIAC VEIN ANGIOPLASTY / STENTING  08/22/2016   Abdominal aortogram, bilateral iliac angiogram, bifemoral runoff  . LOWER EXTREMITY ANGIOGRAM  10/13/2016  .  LUMBAR FUSION  2002   "put screws in"  . PERIPHERAL VASCULAR CATHETERIZATION N/A 03/28/2016   Procedure: Abdominal Aortogram;  Surgeon: Lorretta Harp, MD;  Location: [place] [facility];  Service: Cardiovascular;  Laterality: N/A;  . PERIPHERAL VASCULAR CATHETERIZATION Bilateral 08/22/2016   Procedure: Lower Extremity Angiography;  Surgeon: Lorretta Harp, MD;  Location: [place] [facility];  Service: Cardiovascular;  Laterality: Bilateral;  . PERIPHERAL VASCULAR CATHETERIZATION Left 08/22/2016   Procedure: Peripheral Vascular Intervention;  Surgeon: Lorretta Harp, MD;  Location: [place] [facility];  Service: Cardiovascular;  Laterality: Left;  COMMON ILIAC  . PERIPHERAL VASCULAR CATHETERIZATION Bilateral 10/13/2016   Procedure: Lower Extremity Angiography;  Surgeon: Lorretta Harp, MD;  Location: [place] [facility];  Service: Cardiovascular;  Laterality: Bilateral;  . PERIPHERAL VASCULAR CATHETERIZATION Right 10/13/2016   Procedure: Peripheral Vascular Balloon Angioplasty;  Surgeon: Lorretta Harp, MD;  Location: [facility] [facility];  Service: Cardiovascular;  Laterality: Right;  Right Common ILIac  . POSTERIOR FUSION CERVICAL SPINE  2004  . TEE WITHOUT CARDIOVERSION N/A 04/22/2016   Procedure: TRANSESOPHAGEAL ECHOCARDIOGRAM (TEE);  Surgeon: Gaye Pollack, MD;  Location: [facility];  Service: Open Heart Surgery;  Laterality: N/A;  . TRANSTHORACIC ECHOCARDIOGRAM  08/2013   LVH, EF 55-65%, no WMA.  Moderate mitral regurg    FAMHx:  Family History  Problem Relation Age of Onset  . Cancer Mother        brain  . Diabetes Father   . Heart disease Father   . Diabetes Brother   . Heart disease Brother   . Diabetes Son   . Hyperlipidemia Son   . Coronary artery disease Other        family hx of    SOCHx:   reports that he quit smoking about 10 years ago. His smoking use included cigarettes. He has a 50.00 pack-year smoking history. He has never used smokeless tobacco. He reports that he does  not drink alcohol or use drugs.  ALLERGIES:  Allergies  Allergen Reactions  . Atorvastatin Other (See Comments)    MYALGIAS MUSCLE CRAMPS   . Lovastatin Other (See Comments)    MYALGIAS MUSCLE CRAMPS  . Pravastatin Other (See Comments)    MYALGIAS MUSCLE CRAMPS  . Simvastatin Other (See Comments)    MYALGIAS MUSCLE CRAMPS    ROS: Pertinent items noted in HPI and remainder of comprehensive ROS otherwise negative.  HOME MEDS: Current Outpatient Medications on File Prior to Visit  Medication Sig Dispense Refill  . aspirin EC 81 MG EC tablet Take 1 tablet (81 mg total) by mouth daily.    . Blood Glucose Monitoring Suppl (ONE TOUCH ULTRA SYSTEM KIT) W/DEVICE KIT Patient must check sugar TID 1 each 0  . clopidogrel (PLAVIX) 75 MG tablet Take 1 tablet by mouth once daily 90 tablet 1  . glucose blood (ONE TOUCH ULTRA TEST) test strip USE ONE STRIP TO CHECK GLUCOSE 4 TIMES DAILY  AS NEEDED 100 each 12  . HYDROcodone-acetaminophen (NORCO/VICODIN) 5-325 MG tablet Take 1 tablet by mouth every 6 (six) hours as needed for moderate pain. (Patient taking differently: Take 1 tablet by mouth daily as needed for moderate pain. ) 40 tablet 0  . Insulin Human (INSULIN PUMP) SOLN Inject 1 each into the skin continuous. Meal settings: 2.7 small, 5.5 medium, 8.5 large  Humalog    . lisinopril (PRINIVIL,ZESTRIL) 2.5 MG tablet Take 2.5 mg by mouth daily.    . metoprolol tartrate (LOPRESSOR) 25 MG tablet Take 0.5 tablets (12.5 mg total) by mouth 2 (two) times daily. 90 tablet 1  . ONETOUCH DELICA LANCETS 19F MISC USE ONE  4 TIMES DAILY AS NEEDED 100 each 0  . pantoprazole (PROTONIX) 40 MG tablet TAKE 1 TABLET BY MOUTH ONCE DAILY 90 tablet 3  . research study medication Take 1 each by mouth daily. Study drug for cholesterol from Dr. Deland Pretty     No current facility-administered medications on file prior to visit.     LABS/IMAGING: No results found for this or any previous visit (from the past 48  hour(s)). No results found.  LIPID PANEL:    Component Value Date/Time   CHOL 187 06/27/2019 1056   TRIG 133 06/27/2019 1056   HDL 43 06/27/2019 1056   CHOLHDL 4.3 06/27/2019 1056   CHOLHDL 5 07/02/2014 0824   VLDL 19.8 07/02/2014 0824   LDLCALC 117 (H) 06/27/2019 1056   LDLDIRECT 102.8 03/14/2012 0950    WEIGHTS: Wt Readings from Last 3 Encounters:  09/03/19 143 lb (64.9 kg)  05/08/19 150 lb (68 kg)  06/01/18 143 lb (64.9 kg)    VITALS: BP 120/78 (BP Location: Right Arm, Patient Position: Sitting, Cuff Size: Normal)   Pulse 65   Temp 97.7 F (36.5 C)   Ht '5\' 6"'$  (1.676 m)   Wt 143 lb (64.9 kg)   SpO2 97%   BMI 23.08 kg/m   EXAM: General appearance: alert and no distress Neck: no carotid bruit, no JVD and thyroid not enlarged, symmetric, no tenderness/mass/nodules Lungs: clear to auscultation bilaterally Heart: regular rate and rhythm, S1, S2 normal, no murmur, click, rub or gallop Abdomen: soft, non-tender; bowel sounds normal; no masses,  no organomegaly Extremities: extremities normal, atraumatic, no cyanosis or edema Pulses: 1+ pulses Skin: Skin color, texture, turgor normal. No rashes or lesions Neurologic: Grossly normal Psych: Pleasant  EKG: Deferred  ASSESSMENT: 1. Mixed dyslipidemia, goal LDL less than 70 2. Coronary artery disease with prior PCI 3. PAD 4. Statin intolerance 5. Participating in drug trial with bempedoic acid  PLAN: 1.   Mr. Heigl has a history of statin intolerance and has been participating in a drug trial with bempedoic acid.  We reached out to Elliot Gault, and there is no exclusion of the trial to using PCSK9 inhibitors.  Although Mr. Chaires was supposed to be on Repatha, he did not start the medication due to cost issues in the past.  We have provided him with options for patient assistance and feel confident that he should be able to get the medication at affordable price.  Hopefully can start this and we will plan a repeat  lipid profile in the next few months.  Thanks again for the kind referral.  Pixie Casino, MD, FACC, [place] Director of the [facility]  [facility] Diplomate of the American Board of Clinical Lipidology Attending Cardiologist  Direct Dial: [phone]  Fax: [phone]  Website:  [email or web address] 09/05/2019, 10:39 PM

## 2019-09-06 DIAGNOSIS — Z794 Long term (current) use of insulin: Secondary | ICD-10-CM | POA: Diagnosis not present

## 2019-09-06 DIAGNOSIS — E1140 Type 2 diabetes mellitus with diabetic neuropathy, unspecified: Secondary | ICD-10-CM | POA: Diagnosis not present

## 2019-09-11 DIAGNOSIS — Z79899 Other long term (current) drug therapy: Secondary | ICD-10-CM | POA: Diagnosis not present

## 2019-09-11 DIAGNOSIS — G894 Chronic pain syndrome: Secondary | ICD-10-CM | POA: Diagnosis not present

## 2019-09-11 DIAGNOSIS — G629 Polyneuropathy, unspecified: Secondary | ICD-10-CM | POA: Diagnosis not present

## 2019-09-11 DIAGNOSIS — Z760 Encounter for issue of repeat prescription: Secondary | ICD-10-CM | POA: Diagnosis not present

## 2019-09-24 DIAGNOSIS — E1142 Type 2 diabetes mellitus with diabetic polyneuropathy: Secondary | ICD-10-CM | POA: Diagnosis not present

## 2019-09-24 DIAGNOSIS — L84 Corns and callosities: Secondary | ICD-10-CM | POA: Diagnosis not present

## 2019-09-24 DIAGNOSIS — M79676 Pain in unspecified toe(s): Secondary | ICD-10-CM | POA: Diagnosis not present

## 2019-09-24 DIAGNOSIS — B351 Tinea unguium: Secondary | ICD-10-CM | POA: Diagnosis not present

## 2019-10-07 DIAGNOSIS — E1140 Type 2 diabetes mellitus with diabetic neuropathy, unspecified: Secondary | ICD-10-CM | POA: Diagnosis not present

## 2019-10-07 DIAGNOSIS — Z794 Long term (current) use of insulin: Secondary | ICD-10-CM | POA: Diagnosis not present

## 2019-10-07 DIAGNOSIS — I10 Essential (primary) hypertension: Secondary | ICD-10-CM | POA: Diagnosis not present

## 2019-10-07 DIAGNOSIS — E7800 Pure hypercholesterolemia, unspecified: Secondary | ICD-10-CM | POA: Diagnosis not present

## 2019-10-07 DIAGNOSIS — Z0000 Encounter for general adult medical examination without abnormal findings: Secondary | ICD-10-CM | POA: Diagnosis not present

## 2019-10-09 DIAGNOSIS — M545 Low back pain: Secondary | ICD-10-CM | POA: Diagnosis not present

## 2019-10-11 DIAGNOSIS — Z79899 Other long term (current) drug therapy: Secondary | ICD-10-CM | POA: Diagnosis not present

## 2019-10-11 DIAGNOSIS — G894 Chronic pain syndrome: Secondary | ICD-10-CM | POA: Diagnosis not present

## 2019-10-11 DIAGNOSIS — G629 Polyneuropathy, unspecified: Secondary | ICD-10-CM | POA: Diagnosis not present

## 2019-10-14 DIAGNOSIS — I10 Essential (primary) hypertension: Secondary | ICD-10-CM | POA: Diagnosis not present

## 2019-10-14 DIAGNOSIS — I2510 Atherosclerotic heart disease of native coronary artery without angina pectoris: Secondary | ICD-10-CM | POA: Diagnosis not present

## 2019-10-14 DIAGNOSIS — Z0000 Encounter for general adult medical examination without abnormal findings: Secondary | ICD-10-CM | POA: Diagnosis not present

## 2019-10-14 DIAGNOSIS — E1140 Type 2 diabetes mellitus with diabetic neuropathy, unspecified: Secondary | ICD-10-CM | POA: Diagnosis not present

## 2019-10-14 DIAGNOSIS — Z955 Presence of coronary angioplasty implant and graft: Secondary | ICD-10-CM | POA: Diagnosis not present

## 2019-10-21 ENCOUNTER — Ambulatory Visit: Payer: Medicare Other | Attending: Specialist | Admitting: Physical Therapy

## 2019-10-21 ENCOUNTER — Encounter: Payer: Self-pay | Admitting: Physical Therapy

## 2019-10-21 ENCOUNTER — Other Ambulatory Visit: Payer: Self-pay

## 2019-10-21 DIAGNOSIS — G8929 Other chronic pain: Secondary | ICD-10-CM | POA: Insufficient documentation

## 2019-10-21 DIAGNOSIS — M545 Low back pain: Secondary | ICD-10-CM | POA: Diagnosis not present

## 2019-10-21 DIAGNOSIS — R262 Difficulty in walking, not elsewhere classified: Secondary | ICD-10-CM

## 2019-10-21 DIAGNOSIS — M6281 Muscle weakness (generalized): Secondary | ICD-10-CM | POA: Insufficient documentation

## 2019-10-21 NOTE — Therapy (Signed)
[facility] [address] [place], [place], [address] Phone: [phone]   Fax:  [phone]  Physical Therapy Evaluation  Patient Details  Name: Derrick [name] MRN: [id number] Date of Birth: Oct 06, 1946 Referring Provider (PT): Leandra Kern, MD   Encounter Date: 10/21/2019  PT End of Session - 10/21/19 1535    Visit Number  1    Number of Visits  8    Date for PT Re-Evaluation  11/25/19    Authorization Type  FOTO; progress note at 10th visit; KX modifier at 15th visit    PT Start Time  0946    PT Stop Time  1030    PT Time Calculation (min)  44 min    Activity Tolerance  Patient tolerated treatment well    Behavior During Therapy  [facility] for tasks assessed/performed       Past Medical History:  Diagnosis Date  . Anginal pain (HCC)    occ   . Chronic renal insufficiency, stage III (moderate) 2013   CrCl est high 50s (borderline stage II/III pt denies 12/27/16  . COPD (chronic obstructive pulmonary disease) (HCC)   . Coronary atherosclerosis of native coronary vessel    S/P NSTEMI 04/11/2009--DEL stent to LAD.  Nuclear stress test 11/2010 NEG, EF normal  . Diabetic peripheral neuropathy (HCC)    Painful; 1 vicodin bid very helpful (pt resistant to alternative tx's)  . Early cataracts, bilateral 04/24/2012   [name] in [place], [place]  . GERD (gastroesophageal reflux disease)   . Heart murmur    patient denies  . Herpes zoster    Two separate outbreaks--one on left axillary/back region, one on right axillary/back region  . Hyperlipidemia   . Hypertension   . Mitral regurgitation 08/2013   Moderate (echo)  . Myocardial infarction (HCC) 2010  . Peripheral arterial disease (HCC)   . S/P arterial stent, 08/22/16 Lt [facility] PTA/Stent  08/23/2016  . Type II diabetes mellitus (HCC)     Past Surgical History:  Procedure Laterality Date  . ANTERIOR CERVICAL DECOMP/DISCECTOMY FUSION  2004 X 2   "1st one collapsed; had to go in 1 month  later & do the front and back"  . BACK SURGERY    . CARDIAC CATHETERIZATION N/A 03/28/2016   Procedure: Left Heart Cath and Coronary Angiography;  Surgeon: Runell Gess, MD;  [facility]: [facility] INVAS[facility];  Service: Cardiovascular;  Laterality: N/A;  . CARDIOVASCULAR STRESS TEST  11/2010   No ischemia/normal EF  . CARPAL TUNNEL RELEASE Left 05/28/2015   Procedure: LEFT HAND CARPAL TUNNEL RELEASE;  Surgeon: Bradly Bienenstock, MD;  Location[facility];  Service: Orthopedics;  Laterality: Left;  . COLONOSCOPY  12/2010   Normal screening colonoscopy; rpt 10 yrs  . CORONARY ANGIOPLASTY WITH STENT PLACEMENT  04/2009   DES to LAD  . CORONARY ARTERY BYPASS GRAFT N/A 04/22/2016   Procedure: CORONARY ARTERY BYPASS GRAFTING (CABG) LIMA to OM1 SVG to OM2 FREE RIMA to RCA  ENDOSCOPIC HARVEST GREATER SAPHENOUS VEIN -Right Thigh;  Surgeon: Alleen Borne, MD;  [facility];  Service: Open Heart Surgery;  Laterality: N/A;  . cryotherapy to AK lesion on nose  01/2010  . eye exam,diabetic  02/2011   Normal diabetic retinopathy screening exam at [facility] in [place], [place].  [name] FEMORAL-FEMORAL BYPASS GRAFT Bilateral 12/30/2016   Procedure: BYPASS GRAFT LEFT FEMORAL-RIGHT FEMORAL ARTERY;  Surgeon: Nada Libman, MD;  Location: [facility] OR;  Service: Vascular;  Laterality: Bilateral;  . ILIAC VEIN ANGIOPLASTY / STENTING  08/22/2016   Abdominal aortogram, bilateral iliac angiogram, bifemoral runoff  . LOWER EXTREMITY ANGIOGRAM  10/13/2016  . LUMBAR FUSION  2002   "put screws in"  . PERIPHERAL VASCULAR CATHETERIZATION N/A 03/28/2016   Procedure: Abdominal Aortogram;  Surgeon: Runell GessJonathan J Berry, MD;  [facility];  Service: Cardiovascular;  Laterality: N/A;  . PERIPHERAL VASCULAR CATHETERIZATION Bilateral 08/22/2016   Procedure: Lower Extremity Angiography;  Surgeon: Runell GessJonathan J Berry, MD;  [facility];  Service: Cardiovascular;  Laterality: Bilateral;  . PERIPHERAL VASCULAR  CATHETERIZATION Left 08/22/2016   Procedure: Peripheral Vascular Intervention;  Surgeon: Runell GessJonathan J Berry, MD;  [facility];  Service: Cardiovascular;  Laterality: Left;  COMMON ILIAC  . PERIPHERAL VASCULAR CATHETERIZATION Bilateral 10/13/2016   Procedure: Lower Extremity Angiography;  Surgeon: Runell GessJonathan J Berry, MD;  [facility];  Service: Cardiovascular;  Laterality: Bilateral;  . PERIPHERAL VASCULAR CATHETERIZATION Right 10/13/2016   Procedure: Peripheral Vascular Balloon Angioplasty;  Surgeon: Runell GessJonathan J Berry, MD;  [facility];  Service: Cardiovascular;  Laterality: Right;  Right Common ILIac  . POSTERIOR FUSION CERVICAL SPINE  2004  . TEE WITHOUT CARDIOVERSION N/A 04/22/2016   Procedure: TRANSESOPHAGEAL ECHOCARDIOGRAM (TEE);  Surgeon: Alleen BorneBryan K Bartle, MD;  [facility];  Service: Open Heart Surgery;  Laterality: N/A;  . TRANSTHORACIC ECHOCARDIOGRAM  08/2013   LVH, EF 55-65%, no WMA.  Moderate mitral regurg    There were no vitals filed for this visit.   Subjective Assessment - 10/21/19 1520    Subjective  COVID-19 screening performed upon arrival.Patient arrives to physical therapy with reports of right sided low back pain/glute pain that exacerbated about 1 year ago. Patient reports a progression of pain starting 4 years ago. Patient reports most difficulties with bending forward, lifting, and walking. Patient reports pain at worst as 7/10 and pain at best as 1-2/10. Patient's goals are to decrease pain, improve movement, and return to walking program for diabetes control.    Pertinent History  DM, HTN, PVD    Limitations  Walking;House hold activities    How long can you sit comfortably?  ~1 hr    How long can you stand comfortably?  ~1 hr    How long can you walk comfortably?  short distances    Diagnostic tests  x-ray: normal    Patient Stated Goals  decrease pain, return to walking program for diabetes control    Currently in Pain?   Yes    Pain Score  1     Pain Location  Back    Pain Orientation  Right;Lower    Pain Descriptors / Indicators  Aching;Dull    Pain Type  Chronic pain    Pain Onset  More than a month ago    Pain Frequency  Constant    Aggravating Factors   walking, bending forward    Pain Relieving Factors  resting, muscle relaxer    Effect of Pain on Daily Activities  pain with walking and bending forward and picking up things         [facility] PT Assessment - 10/21/19 0001      Assessment   Medical Diagnosis  Low back pain    Referring Provider (PT)  Leandra KernJeffery Beane, MD    Onset Date/Surgical Date  --   ongoing 4 years, exacerbation 1 year ago   Next MD Visit  "4 weeks"  Prior Therapy  no      Precautions   Precautions  None      Restrictions   Weight Bearing Restrictions  No      Balance Screen   Has the patient fallen in the past 6 months  No    Has the patient had a decrease in activity level because of a fear of falling?   No    Is the patient reluctant to leave their home because of a fear of falling?   No      Home Film/video editor residence    Living Arrangements  Spouse/significant other      Prior Function   Level of Independence  Independent with basic ADLs      Observation/Other Assessments   Focus on Therapeutic Outcomes (FOTO)   52% limited      ROM / Strength   AROM / PROM / Strength  Strength      Strength   Strength Assessment Site  Hip;Knee    Right/Left Hip  Right;Left    Right Hip Flexion  4-/5    Right Hip ABduction  4-/5    Left Hip Flexion  4/5    Left Hip Extension  4-/5    Left Hip ABduction  4-/5    Right/Left Knee  Right;Left    Right Knee Flexion  4+/5    Right Knee Extension  4/5    Left Knee Flexion  4+/5    Left Knee Extension  4+/5      Palpation   Palpation comment  very tender to localized area of upper gluteals      Ambulation/Gait   Gait Pattern  Step-through pattern;Trunk rotated posteriorly on right                 Objective measurements completed on examination: See above findings.      [facility] Adult PT Treatment/Exercise - 10/21/19 0001      Modalities   Modalities  Electrical Stimulation;Moist Heat      Moist Heat Therapy   Number Minutes Moist Heat  10 Minutes    Moist Heat Location  Lumbar Spine      Electrical Stimulation   Electrical Stimulation Location  right upper gluteal/QL region    Electrical Stimulation Action  pre-mod    Electrical Stimulation Parameters  80-150 hz x10 mins    Electrical Stimulation Goals  Pain             PT Education - 10/21/19 1534    Education Details  draw ins, lateral trunk rotation, single knee to chest, bridging    Person(s) Educated  Patient    Methods  Explanation;Demonstration;Handout    Comprehension  Returned demonstration;Verbalized understanding          PT Long Term Goals - 10/21/19 1538      PT LONG TERM GOAL #1   Title  Patient will be independent with HEP    Time  4    Period  Weeks    Status  New      PT LONG TERM GOAL #2   Title  Patient will report ability to perform ADLs and bending/lifting tasks with right low back pain less than or equal to 2/10    Time  4    Period  Weeks    Status  New      PT LONG TERM GOAL #3   Title  Patient will demonstrate 4+/5 or greater bilateral LE  MMT to improve stability during functional tasks.    Time  4    Period  Weeks    Status  New      PT LONG TERM GOAL #4   Title  Patient will report ability to walk 30 minutes or greater with low back pain less than or equal to 3/10 to return to walking program for diabetes control.    Time  4    Period  Weeks    Status  New             Plan - 10/21/19 1536    Clinical Impression Statement  Patient is a 73 year old male who presents to physical therapy with right sided low back pain and bilateral LE weakness R> L. Patient very tender to palpation to very localized area of the right upper gluteal/QL. Patient  (-) Scour test for hip pain. Patient and PT discussed HEP and plan of care to address pain and deficits. Patient reported understanding. Patient would benefit from skilled physical therapy to address deficits and address patient's goals.    Personal Factors and Comorbidities  Comorbidity 1    Comorbidities  DM    Examination-Activity Limitations  Bend;Carry;Locomotion Level;Lift    Stability/Clinical Decision Making  Stable/Uncomplicated    Clinical Decision Making  Low    Rehab Potential  Good    PT Frequency  2x / week   1x per week if unable 2 due to finances   PT Duration  4 weeks    PT Treatment/Interventions  ADLs/Self Care Home Management;Iontophoresis /ml Dexamethasone;Moist Heat;Traction;Ultrasound;Cryotherapy;Electrical Stimulation;Therapeutic exercise;Therapeutic activities;Neuromuscular re-education;Patient/family education;Passive range of motion;Taping;Spinal Manipulations    PT Next Visit Plan  Nustep or bike, low back stretching and strengthening, STW/M and TPR to right QL/upper gluteal trigger point, modalities PRN for pain relief.    PT Home Exercise Plan  see patient education section    Consulted and Agree with Plan of Care  Patient       Patient will benefit from skilled therapeutic intervention in order to improve the following deficits and impairments:  Decreased activity tolerance, Decreased strength, Decreased range of motion, Difficulty walking, Pain, Impaired tone  Visit Diagnosis: Chronic right-sided low back pain, unspecified whether sciatica present - Plan: PT plan of care cert/re-cert  Muscle weakness (generalized) - Plan: PT plan of care cert/re-cert  Difficulty in walking, not elsewhere classified - Plan: PT plan of care cert/re-cert     Problem List Patient Active Problem List   Diagnosis Date Noted  . Atherosclerosis of native arteries of extremities with intermittent claudication, right leg (HCC) 12/30/2016  . Right-sided carotid artery disease  (HCC) 11/08/2016  . S/P arterial stent, 08/22/16 Lt [facility] PTA/Stent  08/23/2016  . PVD (peripheral vascular disease) (HCC) 05/16/2016  . Chronic renal insufficiency, stage III (moderate) 05/16/2016  . S/P CABG x 3 04/22/2016  . Abnormal stress test   . Claudication (HCC)   . Preventative health care 07/02/2014  . Hypertension associated with diabetes (HCC) 12/03/2013  . Normocytic anemia 12/03/2013  . Health maintenance examination 08/16/2013  . Systolic murmur 08/16/2013  . Diabetes mellitus with complication, with long-term current use of insulin (HCC) 11/15/2012  . Prostate cancer screening 03/14/2012  . Actinic keratosis 03/09/2011  . Coronary atherosclerosis 01/25/2011  . PAIN IN SOFT TISSUES OF LIMB 11/08/2010  . Statin intolerance 04/30/2009  . Acute myocardial infarction, subendocardial infarction (HCC) 04/30/2009  . COPD 04/29/2009    Guss Bunde, PT, DPT 10/21/2019, 3:42 PM  [facility] [address] [place], [place], [address] Phone: [phone]   Fax:  [phone]  Name: Derrick [name] MRN: [id number] Date of Birth: 06/17/1946

## 2019-10-22 ENCOUNTER — Other Ambulatory Visit: Payer: Self-pay | Admitting: Cardiovascular Disease

## 2019-10-22 DIAGNOSIS — Z95828 Presence of other vascular implants and grafts: Secondary | ICD-10-CM

## 2019-10-22 DIAGNOSIS — I739 Peripheral vascular disease, unspecified: Secondary | ICD-10-CM

## 2019-10-23 ENCOUNTER — Ambulatory Visit (HOSPITAL_COMMUNITY)
Admission: RE | Admit: 2019-10-23 | Discharge: 2019-10-23 | Disposition: A | Discharge status: Home / Self Care | Payer: Medicare Other | Source: Ambulatory Visit | Attending: Cardiology | Admitting: Cardiology

## 2019-10-23 ENCOUNTER — Other Ambulatory Visit: Payer: Self-pay

## 2019-10-23 ENCOUNTER — Ambulatory Visit (HOSPITAL_BASED_OUTPATIENT_CLINIC_OR_DEPARTMENT_OTHER)
Admission: RE | Admit: 2019-10-23 | Discharge: 2019-10-23 | Disposition: A | Discharge status: Home / Self Care | Payer: Medicare Other | Source: Ambulatory Visit | Attending: Cardiology | Admitting: Cardiology

## 2019-10-23 ENCOUNTER — Other Ambulatory Visit: Payer: Self-pay | Admitting: *Deleted

## 2019-10-23 ENCOUNTER — Encounter: Payer: Self-pay | Admitting: *Deleted

## 2019-10-23 DIAGNOSIS — Z95828 Presence of other vascular implants and grafts: Secondary | ICD-10-CM

## 2019-10-23 DIAGNOSIS — I739 Peripheral vascular disease, unspecified: Secondary | ICD-10-CM

## 2019-10-28 ENCOUNTER — Encounter: Payer: Self-pay | Admitting: Physical Therapy

## 2019-10-28 ENCOUNTER — Other Ambulatory Visit: Payer: Self-pay

## 2019-10-28 ENCOUNTER — Ambulatory Visit: Payer: Medicare Other | Admitting: Physical Therapy

## 2019-10-28 DIAGNOSIS — M6281 Muscle weakness (generalized): Secondary | ICD-10-CM | POA: Diagnosis not present

## 2019-10-28 DIAGNOSIS — G8929 Other chronic pain: Secondary | ICD-10-CM | POA: Diagnosis not present

## 2019-10-28 DIAGNOSIS — M545 Low back pain: Secondary | ICD-10-CM | POA: Diagnosis not present

## 2019-10-28 DIAGNOSIS — R262 Difficulty in walking, not elsewhere classified: Secondary | ICD-10-CM

## 2019-10-28 NOTE — Therapy (Signed)
[facility] [address] [place], [place], [address] Phone: [phone]   Fax:  [phone]  Physical Therapy Treatment  Patient Details  Name: Derrick [name] MRN: [id number] Date of Birth: Sep 04, 1946 Referring Provider (PT): Leandra Kern, MD   Encounter Date: 10/28/2019  PT End of Session - 10/28/19 1203    Visit Number  2    Number of Visits  8    Date for PT Re-Evaluation  11/25/19    Authorization Type  FOTO; progress note at 10th visit; KX modifier at 15th visit    PT Start Time  1115    PT Stop Time  1201    PT Time Calculation (min)  46 min    Activity Tolerance  Patient tolerated treatment well    Behavior During Therapy  [facility] for tasks assessed/performed       Past Medical History:  Diagnosis Date  . Anginal pain (HCC)    occ   . Chronic renal insufficiency, stage III (moderate) 2013   CrCl est high 50s (borderline stage II/III pt denies 12/27/16  . COPD (chronic obstructive pulmonary disease) (HCC)   . Coronary atherosclerosis of native coronary vessel    S/P NSTEMI 04/11/2009--DEL stent to LAD.  Nuclear stress test 11/2010 NEG, EF normal  . Diabetic peripheral neuropathy (HCC)    Painful; 1 vicodin bid very helpful (pt resistant to alternative tx's)  . Early cataracts, bilateral 04/24/2012   [name] in [place], [place]  . GERD (gastroesophageal reflux disease)   . Heart murmur    patient denies  . Herpes zoster    Two separate outbreaks--one on left axillary/back region, one on right axillary/back region  . Hyperlipidemia   . Hypertension   . Mitral regurgitation 08/2013   Moderate (echo)  . Myocardial infarction (HCC) 2010  . Peripheral arterial disease (HCC)   . S/P arterial stent, 08/22/16 Lt CIA PTA/Stent  08/23/2016  . Type II diabetes mellitus (HCC)     Past Surgical History:  Procedure Laterality Date  . ANTERIOR CERVICAL DECOMP/DISCECTOMY FUSION  2004 X 2   "1st one collapsed; had to go in 1 month  later & do the front and back"  . BACK SURGERY    . CARDIAC CATHETERIZATION N/A 03/28/2016   Procedure: Left Heart Cath and Coronary Angiography;  Surgeon: Runell Gess, MD;  Location: [facility] INVASIVE CV LAB;  Service: Cardiovascular;  Laterality: N/A;  . CARDIOVASCULAR STRESS TEST  11/2010   No ischemia/normal EF  . CARPAL TUNNEL RELEASE Left 05/28/2015   Procedure: LEFT HAND CARPAL TUNNEL RELEASE;  Surgeon: Bradly Bienenstock, MD;  Location: [facility];  Service: Orthopedics;  Laterality: Left;  . COLONOSCOPY  12/2010   Normal screening colonoscopy; rpt 10 yrs  . CORONARY ANGIOPLASTY WITH STENT PLACEMENT  04/2009   DES to LAD  . CORONARY ARTERY BYPASS GRAFT N/A 04/22/2016   Procedure: CORONARY ARTERY BYPASS GRAFTING (CABG) LIMA to OM1 SVG to OM2 FREE RIMA to RCA  ENDOSCOPIC HARVEST GREATER SAPHENOUS VEIN -Right Thigh;  Surgeon: Alleen Borne, MD;  [facility];  Service: Open Heart Surgery;  Laterality: N/A;  . cryotherapy to AK lesion on nose  01/2010  . eye exam,diabetic  02/2011   Normal diabetic retinopathy screening exam at [facility] in [place], [place].  [name] FEMORAL-FEMORAL BYPASS GRAFT Bilateral 12/30/2016   Procedure: BYPASS GRAFT LEFT FEMORAL-RIGHT FEMORAL ARTERY;  Surgeon: Nada Libman, MD;  Location: [facility] OR;  Service: Vascular;  Laterality: Bilateral;  . ILIAC VEIN ANGIOPLASTY / STENTING  08/22/2016   Abdominal aortogram, bilateral iliac angiogram, bifemoral runoff  . LOWER EXTREMITY ANGIOGRAM  10/13/2016  . LUMBAR FUSION  2002   "put screws in"  . PERIPHERAL VASCULAR CATHETERIZATION N/A 03/28/2016   Procedure: Abdominal Aortogram;  Surgeon: Runell GessJonathan J Berry, MD;  [facility];  Service: Cardiovascular;  Laterality: N/A;  . PERIPHERAL VASCULAR CATHETERIZATION Bilateral 08/22/2016   Procedure: Lower Extremity Angiography;  Surgeon: Runell GessJonathan J Berry, MD;  [facility];  Service: Cardiovascular;  Laterality: Bilateral;  . PERIPHERAL VASCULAR  CATHETERIZATION Left 08/22/2016   Procedure: Peripheral Vascular Intervention;  Surgeon: Runell GessJonathan J Berry, MD;  [facility];  Service: Cardiovascular;  Laterality: Left;  COMMON ILIAC  . PERIPHERAL VASCULAR CATHETERIZATION Bilateral 10/13/2016   Procedure: Lower Extremity Angiography;  Surgeon: Runell GessJonathan J Berry, MD;  [facility];  Service: Cardiovascular;  Laterality: Bilateral;  . PERIPHERAL VASCULAR CATHETERIZATION Right 10/13/2016   Procedure: Peripheral Vascular Balloon Angioplasty;  Surgeon: Runell GessJonathan J Berry, MD;  [facility];  Service: Cardiovascular;  Laterality: Right;  Right Common ILIac  . POSTERIOR FUSION CERVICAL SPINE  2004  . TEE WITHOUT CARDIOVERSION N/A 04/22/2016   Procedure: TRANSESOPHAGEAL ECHOCARDIOGRAM (TEE);  Surgeon: Alleen BorneBryan K Bartle, MD;  [facility];  Service: Open Heart Surgery;  Laterality: N/A;  . TRANSTHORACIC ECHOCARDIOGRAM  08/2013   LVH, EF 55-65%, no WMA.  Moderate mitral regurg    There were no vitals filed for this visit.  Subjective Assessment - 10/28/19 1118    Subjective  COVID-19 screening performed upon arrival. Patient arrives feeling better and states no pain currently in the back.    Pertinent History  DM, HTN, PVD    Limitations  Walking;House hold activities    How long can you sit comfortably?  ~1 hr    How long can you stand comfortably?  ~1 hr    How long can you walk comfortably?  short distances    Diagnostic tests  x-ray: normal    Patient Stated Goals  decrease pain, return to walking program for diabetes control    Currently in Pain?  No/denies         [facility] PT Assessment - 10/28/19 0001      Assessment   Medical Diagnosis  Low back pain    Referring Provider (PT)  Leandra KernJeffery Beane, MD    Next MD Visit  "4 weeks"    Prior Therapy  no      Precautions   Precautions  None                   [facility] Adult PT Treatment/Exercise - 10/28/19 0001      Exercises   Exercises   Lumbar      Lumbar Exercises: Stretches   Double Knee to Chest Stretch  3 reps;30 seconds    Lower Trunk Rotation  3 reps;30 seconds      Lumbar Exercises: Aerobic   Recumbent Bike  Level 3 to1 x12 minutes      Lumbar Exercises: Standing   Row  Strengthening;Both;20 reps    Row Limitations  Green XTS    Shoulder Extension  Strengthening;Both;20 reps    Shoulder Extension Limitations  Green XTS      Lumbar Exercises: Supine   Clam  20 reps;3 seconds    Bridge  Compliant;20 reps    Straight Leg Raise  10 reps  Modalities   Modalities  Electrical Stimulation;Moist Surveyor, mining Location  right upper gluteal/QL region    Electrical Stimulation Action  pre-mod    Electrical Stimulation Parameters  80-150 hz x10 mins    Electrical Stimulation Goals  Tone      Manual Therapy   Manual Therapy  Soft tissue mobilization    Soft tissue mobilization  STW/M to right PSIS region, TPR to decrease tone                  PT Long Term Goals - 10/21/19 1538      PT LONG TERM GOAL #1   Title  Patient will be independent with HEP    Time  4    Period  Weeks    Status  New      PT LONG TERM GOAL #2   Title  Patient will report ability to perform ADLs and bending/lifting tasks with right low back pain less than or equal to 2/10    Time  4    Period  Weeks    Status  New      PT LONG TERM GOAL #3   Title  Patient will demonstrate 4+/5 or greater bilateral LE MMT to improve stability during functional tasks.    Time  4    Period  Weeks    Status  New      PT LONG TERM GOAL #4   Title  Patient will report ability to walk 30 minutes or greater with low back pain less than or equal to 3/10 to return to walking program for diabetes control.    Time  4    Period  Weeks    Status  New            Plan - 10/28/19 1120    Clinical Impression Statement  Patient arrives doing well with no reports of low back pain at start of  session. Patient's quads began to tighten up during the bike to which the resistance was decreased to level 1. Patient was able to complete remaining time with intermittent rest breaks. Patient guided thorugh TEs and was provided demonstration and cuing for proper form. Patient noted with increased tone in PSIS region to which decreased after STW/M. Normal response to modalities upon removal. No MHP due to machine being out of order.    Personal Factors and Comorbidities  Comorbidity 1    Comorbidities  DM    Examination-Activity Limitations  Bend;Carry;Locomotion Level;Lift    Stability/Clinical Decision Making  Stable/Uncomplicated    Clinical Decision Making  Low    Rehab Potential  Good    PT Frequency  2x / week    PT Duration  4 weeks    PT Treatment/Interventions  ADLs/Self Care Home Management;Iontophoresis 4mg /ml Dexamethasone;Moist Heat;Traction;Ultrasound;Cryotherapy;Electrical Stimulation;Therapeutic exercise;Therapeutic activities;Neuromuscular re-education;Patient/family education;Passive range of motion;Taping;Spinal Manipulations    PT Next Visit Plan  Nustep or bike, low back stretching and strengthening, STW/M and TPR to right QL/upper gluteal trigger point, modalities PRN for pain relief.    Consulted and Agree with Plan of Care  Patient       Patient will benefit from skilled therapeutic intervention in order to improve the following deficits and impairments:  Decreased activity tolerance, Decreased strength, Decreased range of motion, Difficulty walking, Pain, Impaired tone  Visit Diagnosis: Chronic right-sided low back pain, unspecified whether sciatica present  Muscle weakness (generalized)  Difficulty in walking, not elsewhere  classified     Problem List Patient Active Problem List   Diagnosis Date Noted  . Atherosclerosis of native arteries of extremities with intermittent claudication, right leg ([place]) 12/30/2016  . Right-sided carotid artery disease ([place])  11/08/2016  . S/P arterial stent, 08/22/16 Lt [facility] PTA/Stent  08/23/2016  . PVD (peripheral vascular disease) ([place]) 05/16/2016  . Chronic renal insufficiency, stage III (moderate) 05/16/2016  . S/P CABG x 3 04/22/2016  . Abnormal stress test   . Claudication ([place])   . Preventative health care 07/02/2014  . Hypertension associated with diabetes ([place]) 12/03/2013  . Normocytic anemia 12/03/2013  . Health maintenance examination 08/16/2013  . Systolic murmur 55/37/4827  . Diabetes mellitus with complication, with long-term current use of insulin ([place]) 11/15/2012  . Prostate cancer screening 03/14/2012  . Actinic keratosis 03/09/2011  . Coronary atherosclerosis 01/25/2011  . PAIN IN SOFT TISSUES OF LIMB 11/08/2010  . Statin intolerance 04/30/2009  . Acute myocardial infarction, subendocardial infarction ([place]) 04/30/2009  . COPD 04/29/2009    Gabriela Eves, PT, DPT 10/28/2019, 12:16 PM  [facility] [address] [place], [place], [address] Phone: [phone]   Fax:  [phone]  Name: GIBRIL MASTRO MRN: [id number] Date of Birth: 08/18/1946

## 2019-11-06 ENCOUNTER — Ambulatory Visit: Payer: Medicare Other | Attending: Specialist | Admitting: Physical Therapy

## 2019-11-06 ENCOUNTER — Other Ambulatory Visit: Payer: Self-pay

## 2019-11-06 ENCOUNTER — Encounter: Payer: Self-pay | Admitting: Physical Therapy

## 2019-11-06 DIAGNOSIS — M545 Low back pain, unspecified: Secondary | ICD-10-CM

## 2019-11-06 DIAGNOSIS — Z794 Long term (current) use of insulin: Secondary | ICD-10-CM | POA: Diagnosis not present

## 2019-11-06 DIAGNOSIS — G8929 Other chronic pain: Secondary | ICD-10-CM | POA: Insufficient documentation

## 2019-11-06 DIAGNOSIS — E1140 Type 2 diabetes mellitus with diabetic neuropathy, unspecified: Secondary | ICD-10-CM | POA: Diagnosis not present

## 2019-11-06 DIAGNOSIS — R262 Difficulty in walking, not elsewhere classified: Secondary | ICD-10-CM | POA: Diagnosis not present

## 2019-11-06 DIAGNOSIS — M6281 Muscle weakness (generalized): Secondary | ICD-10-CM | POA: Insufficient documentation

## 2019-11-06 NOTE — Patient Instructions (Signed)

## 2019-11-06 NOTE — Therapy (Signed)
[facility] [address] [place], [place], [address] Phone: [phone]   Fax:  [phone]  Physical Therapy Treatment  Patient Details  Name: Derrick RedderJerry E [name] MRN: [id number] Date of Birth: 05-25-1946 Referring Provider (PT): Leandra KernJeffery Beane, MD   Encounter Date: 11/06/2019  PT End of Session - 11/06/19 1022    Visit Number  3    Number of Visits  8    Date for PT Re-Evaluation  11/25/19    Authorization Type  FOTO; progress note at 10th visit; KX modifier at 15th visit    PT Start Time  0945    PT Stop Time  1033    PT Time Calculation (min)  48 min       Past Medical History:  Diagnosis Date  . Anginal pain (HCC)    occ   . Chronic renal insufficiency, stage III (moderate) 2013   CrCl est high 50s (borderline stage II/III pt denies 12/27/16  . COPD (chronic obstructive pulmonary disease) (HCC)   . Coronary atherosclerosis of native coronary vessel    S/P NSTEMI 04/11/2009--DEL stent to LAD.  Nuclear stress test 11/2010 NEG, EF normal  . Diabetic peripheral neuropathy (HCC)    Painful; 1 vicodin bid very helpful (pt resistant to alternative tx's)  . Early cataracts, bilateral 04/24/2012   [name] [place]  . GERD (gastroesophageal reflux disease)   . Heart murmur    patient denies  . Herpes zoster    Two separate outbreaks--one on left axillary/back region, one on right axillary/back region  . Hyperlipidemia   . Hypertension   . Mitral regurgitation 08/2013   Moderate (echo)  . Myocardial infarction (HCC) 2010  . Peripheral arterial disease (HCC)   . S/P arterial stent, 08/22/16 Lt CIA PTA/Stent  08/23/2016  . Type II diabetes mellitus (HCC)     Past Surgical History:  Procedure Laterality Date  . ANTERIOR CERVICAL DECOMP/DISCECTOMY FUSION  2004 X 2   "1st one collapsed; had to go in 1 month later & do the front and back"  . BACK SURGERY    . CARDIAC CATHETERIZATION N/A 03/28/2016   Procedure: Left Heart  Cath and Coronary Angiography;  Surgeon: Runell GessJonathan J Berry, MD;  [facility];  Service: Cardiovascular;  Laterality: N/A;  . CARDIOVASCULAR STRESS TEST  11/2010   No ischemia/normal EF  . CARPAL TUNNEL RELEASE Left 05/28/2015   Procedure: LEFT HAND CARPAL TUNNEL RELEASE;  Surgeon: Bradly BienenstockFred Ortmann, MD;  [facility];  Service: Orthopedics;  Laterality: Left;  . COLONOSCOPY  12/2010   Normal screening colonoscopy; rpt 10 yrs  . CORONARY ANGIOPLASTY WITH STENT PLACEMENT  04/2009   DES to LAD  . CORONARY ARTERY BYPASS GRAFT N/A 04/22/2016   Procedure: CORONARY ARTERY BYPASS GRAFTING (CABG) LIMA to OM1 SVG to OM2 FREE RIMA to RCA  ENDOSCOPIC HARVEST GREATER SAPHENOUS VEIN -Right Thigh;  Surgeon: Alleen BorneBryan K Bartle, MD;  [facility]: [facility] OR;  Service: Open Heart Surgery;  Laterality: N/A;  . cryotherapy to AK lesion on nose  01/2010  . eye exam,diabetic  02/2011   [facility] [place].  [name]. FEMORAL-FEMORAL BYPASS GRAFT Bilateral 12/30/2016   Procedure: BYPASS GRAFT LEFT FEMORAL-RIGHT FEMORAL ARTERY;  Surgeon: Nada LibmanVance W Brabham, MD;  [facility];  Service: Vascular;  Laterality: Bilateral;  . ILIAC VEIN ANGIOPLASTY / STENTING  08/22/2016   Abdominal aortogram, bilateral iliac angiogram, bifemoral runoff  .  LOWER EXTREMITY ANGIOGRAM  10/13/2016  . LUMBAR FUSION  2002   "put screws in"  . PERIPHERAL VASCULAR CATHETERIZATION N/A 03/28/2016   Procedure: Abdominal Aortogram;  Surgeon: Runell Gess, MD;  [facility]AS[facility] CV LAB;  Service: Cardiovascular;  Laterality: N/A;  . PERIPHERAL VASCULAR CATHETERIZATION Bilateral 08/22/2016   Procedure: Lower Extremity Angiography;  Surgeon: Runell Gess, MD;  [facility] CV LAB;  Service: Cardiovascular;  Laterality: Bilateral;  . PERIPHERAL VASCULAR CATHETERIZATION Left 08/22/2016   Procedure: Peripheral Vascular Intervention;  Surgeon: Runell Gess, MD;  Location:  [facility] INVASIVE CV LAB;  Service: Cardiovascular;  Laterality: Left;  COMMON ILIAC  . PERIPHERAL VASCULAR CATHETERIZATION Bilateral 10/13/2016   Procedure: Lower Extremity Angiography;  Surgeon: Runell Gess, MD;  [facility] INVAS[facility] CV LAB;  Service: Cardiovascular;  Laterality: Bilateral;  . PERIPHERAL VASCULAR CATHETERIZATION Right 10/13/2016   Procedure: Peripheral Vascular Balloon Angioplasty;  Surgeon: Runell Gess, MD;  [facility] INVASIVE CV LAB;  Service: Cardiovascular;  Laterality: Right;  Right Common ILIac  . POSTERIOR FUSION CERVICAL SPINE  2004  . TEE WITHOUT CARDIOVERSION N/A 04/22/2016   Procedure: TRANSESOPHAGEAL ECHOCARDIOGRAM (TEE);  Surgeon: Alleen Borne, MD;  Location: [facility] OR;  Service: Open Heart Surgery;  Laterality: N/A;  . TRANSTHORACIC ECHOCARDIOGRAM  08/2013   LVH, EF 55-65%, no WMA.  Moderate mitral regurg    There were no vitals filed for this visit.  Subjective Assessment - 11/06/19 0947    Subjective  COVID-19 screening performed upon arrival. Patient arrived with pain in back    Pertinent History  DM, HTN, PVD    Limitations  Walking;House hold activities    How long can you sit comfortably?  ~1 hr    How long can you stand comfortably?  ~1 hr    How long can you walk comfortably?  short distances    Diagnostic tests  x-ray: normal    Patient Stated Goals  decrease pain, return to walking program for diabetes control    Currently in Pain?  Yes    Pain Score  2     Pain Location  Back    Pain Descriptors / Indicators  Sore;Discomfort    Pain Type  Chronic pain    Pain Onset  More than a month ago    Pain Frequency  Constant    Aggravating Factors   walking    Pain Relieving Factors  at rest                       [facility] Adult PT Treatment/Exercise - 11/06/19 0001      Self-Care   Self-Care  ADL's;Lifting;Posture;Other Self-Care Comments    Other Self-Care Comments   HEP provided      Lumbar Exercises: Stretches   Single Knee  to Chest Stretch  Right;1 rep;20 seconds;Left    Double Knee to Chest Stretch  1 rep;20 seconds      Lumbar Exercises: Aerobic   Recumbent Bike  L1x6min      Lumbar Exercises: Supine   Ab Set  10 reps;5 seconds    Glut Set  10 reps;5 seconds    Clam  20 reps   with red t-band   Bent Knee Raise  3 seconds   2x10   Bridge  20 reps    Straight Leg Raise  3 seconds   2x10     Moist Heat Therapy   Number Minutes Moist Heat  15 Minutes  Moist Heat Location  Lumbar Spine      Electrical Stimulation   Electrical Stimulation Location  right upper gluteal/QL region    Electrical Stimulation Action  premod    Electrical Stimulation Parameters  80-150hz  x16min    Electrical Stimulation Goals  Pain;Tone             PT Education - 11/06/19 1020    Education Details  posture awareness techniques    Person(s) Educated  Patient    Methods  Explanation;Demonstration;Handout    Comprehension  Verbalized understanding;Returned demonstration          PT Long Term Goals - 11/06/19 0955      PT LONG TERM GOAL #1   Title  Patient will be independent with HEP    Time  4    Period  Weeks    Status  On-going      PT LONG TERM GOAL #2   Title  Patient will report ability to perform ADLs and bending/lifting tasks with right low back pain less than or equal to 2/10    Time  4    Period  Weeks    Status  On-going      PT LONG TERM GOAL #3   Title  Patient will demonstrate 4+/5 or greater bilateral LE MMT to improve stability during functional tasks.    Time  4    Period  Weeks    Status  On-going      PT LONG TERM GOAL #4   Title  Patient will report ability to walk 30 minutes or greater with low back pain less than or equal to 3/10 to return to walking program for diabetes control.    Time  4    Period  Weeks    Status  On-going            Plan - 11/06/19 1023    Clinical Impression Statement  Patient tolerated treatment well today. Today focused on education on  posture awareness techniques with HEP provided then core activation exercises to improve stability and strength. Patient has reported more pain with walking and difficulty with and prolong ADL's due to pain limitations. Goals ongoing at this time.    Personal Factors and Comorbidities  Comorbidity 1    Comorbidities  DM    Examination-Activity Limitations  Bend;Carry;Locomotion Level;Lift    Stability/Clinical Decision Making  Stable/Uncomplicated    Rehab Potential  Good    PT Frequency  2x / week    PT Duration  4 weeks    PT Treatment/Interventions  ADLs/Self Care Home Management;Iontophoresis 4mg /ml Dexamethasone;Moist Heat;Traction;Ultrasound;Cryotherapy;Electrical Stimulation;Therapeutic exercise;Therapeutic activities;Neuromuscular re-education;Patient/family education;Passive range of motion;Taping;Spinal Manipulations    PT Next Visit Plan  cont with POC for  low back stretching and strengthening, STW/M and TPR to right QL/upper gluteal trigger point, modalities PRN for pain relief.    Consulted and Agree with Plan of Care  Patient       Patient will benefit from skilled therapeutic intervention in order to improve the following deficits and impairments:  Decreased activity tolerance, Decreased strength, Decreased range of motion, Difficulty walking, Pain, Impaired tone  Visit Diagnosis: Chronic right-sided low back pain, unspecified whether sciatica present  Muscle weakness (generalized)  Difficulty in walking, not elsewhere classified     Problem List Patient Active Problem List   Diagnosis Date Noted  . Atherosclerosis of native arteries of extremities with intermittent claudication, right leg ([place]) 12/30/2016  . Right-sided carotid artery disease ([place]) 11/08/2016  .  S/P arterial stent, 08/22/16 Lt CIA PTA/Stent  08/23/2016  . PVD (peripheral vascular disease) (HCC) 05/16/2016  . Chronic renal insufficiency, stage III (moderate) 05/16/2016  . S/P CABG x 3 04/22/2016  .  Abnormal stress test   . Claudication (HCC)   . Preventative health care 07/02/2014  . Hypertension associated with diabetes (HCC) 12/03/2013  . Normocytic anemia 12/03/2013  . Health maintenance examination 08/16/2013  . Systolic murmur 08/16/2013  . Diabetes mellitus with complication, with long-term current use of insulin (HCC) 11/15/2012  . Prostate cancer screening 03/14/2012  . Actinic keratosis 03/09/2011  . Coronary atherosclerosis 01/25/2011  . PAIN IN SOFT TISSUES OF LIMB 11/08/2010  . Statin intolerance 04/30/2009  . Acute myocardial infarction, subendocardial infarction (HCC) 04/30/2009  . COPD 04/29/2009    Hermelinda Dellen, PTA 11/06/2019, 10:36 AM  [facility] [address] [place], [place], [address] Phone: [phone]   Fax:  [phone]  Name: YING ROCKS MRN: [id number] Date of Birth: [birth year]

## 2019-11-08 ENCOUNTER — Telehealth: Payer: Self-pay | Admitting: Internal Medicine

## 2019-11-08 NOTE — Telephone Encounter (Signed)
Lipid panel order with appt reminder (to be scheduled Feb/March or 3-4 months after starting Repatha) mailed to patient

## 2019-11-13 ENCOUNTER — Other Ambulatory Visit: Payer: Self-pay

## 2019-11-13 ENCOUNTER — Ambulatory Visit: Payer: Medicare Other | Admitting: Physical Therapy

## 2019-11-13 DIAGNOSIS — M6281 Muscle weakness (generalized): Secondary | ICD-10-CM | POA: Diagnosis not present

## 2019-11-13 DIAGNOSIS — M545 Low back pain, unspecified: Secondary | ICD-10-CM

## 2019-11-13 DIAGNOSIS — R262 Difficulty in walking, not elsewhere classified: Secondary | ICD-10-CM

## 2019-11-13 DIAGNOSIS — G8929 Other chronic pain: Secondary | ICD-10-CM | POA: Diagnosis not present

## 2019-11-13 NOTE — Therapy (Signed)
[facility] [address] [place], [place], [address] Phone: [phone]   Fax:  [phone]  Physical Therapy Treatment  Patient Details  Name: Derrick [name] MRN: [id number] Date of Birth: Jan 18, 1946 Referring Provider (PT): Leandra Kern, MD   Encounter Date: 11/13/2019  PT End of Session - 11/13/19 1122    Visit Number  4    Number of Visits  8    Date for PT Re-Evaluation  11/25/19    Authorization Type  FOTO; progress note at 10th visit; KX modifier at 15th visit    PT Start Time  1114    PT Stop Time  1158    PT Time Calculation (min)  44 min    Activity Tolerance  Patient tolerated treatment well    Behavior During Therapy  [facility] for tasks assessed/performed       Past Medical History:  Diagnosis Date  . Anginal pain (HCC)    occ   . Chronic renal insufficiency, stage III (moderate) 2013   CrCl est high 50s (borderline stage II/III pt denies 12/27/16  . COPD (chronic obstructive pulmonary disease) (HCC)   . Coronary atherosclerosis of native coronary vessel    S/P NSTEMI 04/11/2009--DEL stent to LAD.  Nuclear stress test 11/2010 NEG, EF normal  . Diabetic peripheral neuropathy (HCC)    Painful; 1 vicodin bid very helpful (pt resistant to alternative tx's)  . Early cataracts, bilateral 04/24/2012   [name] in [place], [place]  . GERD (gastroesophageal reflux disease)   . Heart murmur    patient denies  . Herpes zoster    Two separate outbreaks--one on left axillary/back region, one on right axillary/back region  . Hyperlipidemia   . Hypertension   . Mitral regurgitation 08/2013   Moderate (echo)  . Myocardial infarction (HCC) 2010  . Peripheral arterial disease (HCC)   . S/P arterial stent, 08/22/16 Lt CIA PTA/Stent  08/23/2016  . Type II diabetes mellitus (HCC)     Past Surgical History:  Procedure Laterality Date  . ANTERIOR CERVICAL DECOMP/DISCECTOMY FUSION  2004 X 2   "1st one collapsed; had to go in 1 month  later & do the front and back"  . BACK SURGERY    . CARDIAC CATHETERIZATION N/A 03/28/2016   Procedure: Left Heart Cath and Coronary Angiography;  Surgeon: Runell Gess, MD;  Location: [facility] INVASIVE CV LAB;  Service: Cardiovascular;  Laterality: N/A;  . CARDIOVASCULAR STRESS TEST  11/2010   No ischemia/normal EF  . CARPAL TUNNEL RELEASE Left 05/28/2015   Procedure: LEFT HAND CARPAL TUNNEL RELEASE;  Surgeon: Bradly Bienenstock, MD;  Location: [facility];  Service: Orthopedics;  Laterality: Left;  . COLONOSCOPY  12/2010   Normal screening colonoscopy; rpt 10 yrs  . CORONARY ANGIOPLASTY WITH STENT PLACEMENT  04/2009   DES to LAD  . CORONARY ARTERY BYPASS GRAFT N/A 04/22/2016   Procedure: CORONARY ARTERY BYPASS GRAFTING (CABG) LIMA to OM1 SVG to OM2 FREE RIMA to RCA  ENDOSCOPIC HARVEST GREATER SAPHENOUS VEIN -Right Thigh;  Surgeon: Alleen Borne, MD;  [facility];  Service: Open Heart Surgery;  Laterality: N/A;  . cryotherapy to AK lesion on nose  01/2010  . eye exam,diabetic  02/2011   Normal diabetic retinopathy screening exam at [facility] eye care in [place], [place].  [name] FEMORAL-FEMORAL BYPASS GRAFT Bilateral 12/30/2016   Procedure: BYPASS GRAFT LEFT FEMORAL-RIGHT FEMORAL ARTERY;  Surgeon: Nada Libman, MD;  Location: [facility] OR;  Service: Vascular;  Laterality: Bilateral;  . ILIAC VEIN ANGIOPLASTY / STENTING  08/22/2016   Abdominal aortogram, bilateral iliac angiogram, bifemoral runoff  . LOWER EXTREMITY ANGIOGRAM  10/13/2016  . LUMBAR FUSION  2002   "put screws in"  . PERIPHERAL VASCULAR CATHETERIZATION N/A 03/28/2016   Procedure: Abdominal Aortogram;  Surgeon: Runell Gess, MD;  Location: [facility] INVASIVE CV LAB;  Service: Cardiovascular;  Laterality: N/A;  . PERIPHERAL VASCULAR CATHETERIZATION Bilateral 08/22/2016   Procedure: Lower Extremity Angiography;  Surgeon: Runell Gess, MD;  Location: [facility] INVASIVE CV LAB;  Service: Cardiovascular;  Laterality: Bilateral;  . PERIPHERAL VASCULAR  CATHETERIZATION Left 08/22/2016   Procedure: Peripheral Vascular Intervention;  Surgeon: Runell Gess, MD;  Location: [facility] INVASIVE CV LAB;  Service: Cardiovascular;  Laterality: Left;  COMMON ILIAC  . PERIPHERAL VASCULAR CATHETERIZATION Bilateral 10/13/2016   Procedure: Lower Extremity Angiography;  Surgeon: Runell Gess, MD;  Location: [facility] INVASIVE CV LAB;  Service: Cardiovascular;  Laterality: Bilateral;  . PERIPHERAL VASCULAR CATHETERIZATION Right 10/13/2016   Procedure: Peripheral Vascular Balloon Angioplasty;  Surgeon: Runell Gess, MD;  Location: [facility] INVASIVE CV LAB;  Service: Cardiovascular;  Laterality: Right;  Right Common ILIac  . POSTERIOR FUSION CERVICAL SPINE  2004  . TEE WITHOUT CARDIOVERSION N/A 04/22/2016   Procedure: TRANSESOPHAGEAL ECHOCARDIOGRAM (TEE);  Surgeon: Alleen Borne, MD;  Location: [facility] OR;  Service: Open Heart Surgery;  Laterality: N/A;  . TRANSTHORACIC ECHOCARDIOGRAM  08/2013   LVH, EF 55-65%, no WMA.  Moderate mitral regurg    There were no vitals filed for this visit.  Subjective Assessment - 11/13/19 1121    Subjective  COVID-19 screening performed upon arrival. Patient reports the R low back feels a little better.    Pertinent History  DM, HTN, PVD    Limitations  Walking;House hold activities    How long can you sit comfortably?  ~1 hr    How long can you stand comfortably?  ~1 hr    How long can you walk comfortably?  short distances    Diagnostic tests  x-ray: normal    Patient Stated Goals  decrease pain, return to walking program for diabetes control    Currently in Pain?  Yes    Pain Score  1     Pain Location  Back    Pain Orientation  Right;Lower    Pain Descriptors / Indicators  Discomfort    Pain Type  Chronic pain    Pain Onset  More than a month ago    Pain Frequency  Intermittent    Aggravating Factors   Walking, bending over                       [facility] Adult PT Treatment/Exercise - 11/13/19 0001      Lumbar  Exercises: Stretches   Passive Hamstring Stretch  Right;3 reps;30 seconds    Single Knee to Chest Stretch  Right;3 reps;30 seconds    Lower Trunk Rotation  5 reps;10 seconds      Lumbar Exercises: Aerobic   Recumbent Bike  L3 x10 min      Lumbar Exercises: Supine   Clam  20 reps;3 seconds   red theraband   Bent Knee Raise  20 reps;3 seconds   red theraband   Bridge  20 reps;5 seconds    Straight Leg Raise  20 reps      Modalities   Modalities  Electrical Stimulation;Moist Heat      Moist Heat Therapy  Number Minutes Moist Heat  15 Minutes    Moist Heat Location  Lumbar Spine      Electrical Stimulation   Electrical Stimulation Location  right upper gluteal/QL region    Electrical Stimulation Action  Pre-Mod    Electrical Stimulation Parameters  80-150 hz x15 min    Electrical Stimulation Goals  Pain;Tone                  PT Long Term Goals - 11/06/19 0955      PT LONG TERM GOAL #1   Title  Patient will be independent with HEP    Time  4    Period  Weeks    Status  On-going      PT LONG TERM GOAL #2   Title  Patient will report ability to perform ADLs and bending/lifting tasks with right low back pain less than or equal to 2/10    Time  4    Period  Weeks    Status  On-going      PT LONG TERM GOAL #3   Title  Patient will demonstrate 4+/5 or greater bilateral LE MMT to improve stability during functional tasks.    Time  4    Period  Weeks    Status  On-going      PT LONG TERM GOAL #4   Title  Patient will report ability to walk 30 minutes or greater with low back pain less than or equal to 3/10 to return to walking program for diabetes control.    Time  4    Period  Weeks    Status  On-going            Plan - 11/13/19 1200    Clinical Impression Statement  Patient presented in clinic with low level R LBP predominately with bending or walking. No complaints were reported by patient during therex and stretching. Minimal overpressure provided  for LTR to improve stretch. Core activation VCs provided throughout therex to incorporate for core strengthening and stability. Normal modalities response noted following removal of the modalities.    Personal Factors and Comorbidities  Comorbidity 1    Comorbidities  DM    Examination-Activity Limitations  Bend;Carry;Locomotion Level;Lift    Stability/Clinical Decision Making  Stable/Uncomplicated    Rehab Potential  Good    PT Frequency  2x / week    PT Duration  4 weeks    PT Treatment/Interventions  ADLs/Self Care Home Management;Iontophoresis 4mg /ml Dexamethasone;Moist Heat;Traction;Ultrasound;Cryotherapy;Electrical Stimulation;Therapeutic exercise;Therapeutic activities;Neuromuscular re-education;Patient/family education;Passive range of motion;Taping;Spinal Manipulations    PT Next Visit Plan  cont with POC for  low back stretching and strengthening, STW/M and TPR to right QL/upper gluteal trigger point, modalities PRN for pain relief.    PT Home Exercise Plan  see patient education section    Consulted and Agree with Plan of Care  Patient       Patient will benefit from skilled therapeutic intervention in order to improve the following deficits and impairments:  Decreased activity tolerance, Decreased strength, Decreased range of motion, Difficulty walking, Pain, Impaired tone  Visit Diagnosis: Chronic right-sided low back pain, unspecified whether sciatica present  Muscle weakness (generalized)  Difficulty in walking, not elsewhere classified     Problem List Patient Active Problem List   Diagnosis Date Noted  . Atherosclerosis of native arteries of extremities with intermittent claudication, right leg (HCC) 12/30/2016  . Right-sided carotid artery disease (HCC) 11/08/2016  . S/P arterial stent, 08/22/16 Lt [facility] PTA/Stent  08/23/2016  . PVD (peripheral vascular disease) ([place]) 05/16/2016  . Chronic renal insufficiency, stage III (moderate) 05/16/2016  . S/P CABG x 3  04/22/2016  . Abnormal stress test   . Claudication ([place])   . Preventative health care 07/02/2014  . Hypertension associated with diabetes ([place]) 12/03/2013  . Normocytic anemia 12/03/2013  . Health maintenance examination 08/16/2013  . Systolic murmur 82/80/0349  . Diabetes mellitus with complication, with long-term current use of insulin ([place]) 11/15/2012  . Prostate cancer screening 03/14/2012  . Actinic keratosis 03/09/2011  . Coronary atherosclerosis 01/25/2011  . PAIN IN SOFT TISSUES OF LIMB 11/08/2010  . Statin intolerance 04/30/2009  . Acute myocardial infarction, subendocardial infarction ([place]) 04/30/2009  . COPD 04/29/2009    Standley Brooking, PTA 11/13/2019, 12:13 PM  [facility] [address] [place], [place], [address] Phone: [phone]   Fax:  [phone]  Name: Derrick [name] MRN: [id number] Date of Birth: 24-Apr-1946

## 2019-11-19 ENCOUNTER — Other Ambulatory Visit: Payer: Self-pay

## 2019-11-19 ENCOUNTER — Ambulatory Visit: Payer: Medicare Other | Admitting: *Deleted

## 2019-11-19 DIAGNOSIS — R262 Difficulty in walking, not elsewhere classified: Secondary | ICD-10-CM | POA: Diagnosis not present

## 2019-11-19 DIAGNOSIS — M545 Low back pain, unspecified: Secondary | ICD-10-CM

## 2019-11-19 DIAGNOSIS — G8929 Other chronic pain: Secondary | ICD-10-CM | POA: Diagnosis not present

## 2019-11-19 DIAGNOSIS — M6281 Muscle weakness (generalized): Secondary | ICD-10-CM | POA: Diagnosis not present

## 2019-11-19 NOTE — Therapy (Signed)
[facility] [address] [place], [place], [address] Phone: [phone]   Fax:  [phone]  Physical Therapy Treatment  Patient Details  Name: Derrick [name] MRN: [id number] Date of Birth: Mar 22, 1946 Referring Provider (PT): Leandra Kern, MD   Encounter Date: 11/19/2019  PT End of Session - 11/19/19 1308    Visit Number  5    Number of Visits  8    Date for PT Re-Evaluation  11/25/19    Authorization Type  FOTO; progress note at 10th visit; KX modifier at 15th visit    PT Start Time  1300    PT Stop Time  1351    PT Time Calculation (min)  51 min       Past Medical History:  Diagnosis Date  . Anginal pain (HCC)    occ   . Chronic renal insufficiency, stage III (moderate) 2013   CrCl est high 50s (borderline stage II/III pt denies 12/27/16  . COPD (chronic obstructive pulmonary disease) (HCC)   . Coronary atherosclerosis of native coronary vessel    S/P NSTEMI 04/11/2009--DEL stent to LAD.  Nuclear stress test 11/2010 NEG, EF normal  . Diabetic peripheral neuropathy (HCC)    Painful; 1 vicodin bid very helpful (pt resistant to alternative tx's)  . Early cataracts, bilateral 04/24/2012   [name] in [place], [place]  . GERD (gastroesophageal reflux disease)   . Heart murmur    patient denies  . Herpes zoster    Two separate outbreaks--one on left axillary/back region, one on right axillary/back region  . Hyperlipidemia   . Hypertension   . Mitral regurgitation 08/2013   Moderate (echo)  . Myocardial infarction (HCC) 2010  . Peripheral arterial disease (HCC)   . S/P arterial stent, 08/22/16 Lt CIA PTA/Stent  08/23/2016  . Type II diabetes mellitus (HCC)     Past Surgical History:  Procedure Laterality Date  . ANTERIOR CERVICAL DECOMP/DISCECTOMY FUSION  2004 X 2   "1st one collapsed; had to go in 1 month later & do the front and back"  . BACK SURGERY    . CARDIAC CATHETERIZATION N/A 03/28/2016   Procedure: Left Heart  Cath and Coronary Angiography;  Surgeon: Runell Gess, MD;  Location: [facility] INVASIVE CV LAB;  Service: Cardiovascular;  Laterality: N/A;  . CARDIOVASCULAR STRESS TEST  11/2010   No ischemia/normal EF  . CARPAL TUNNEL RELEASE Left 05/28/2015   Procedure: LEFT HAND CARPAL TUNNEL RELEASE;  Surgeon: Bradly Bienenstock, MD;  Location: [facility];  Service: Orthopedics;  Laterality: Left;  . COLONOSCOPY  12/2010   Normal screening colonoscopy; rpt 10 yrs  . CORONARY ANGIOPLASTY WITH STENT PLACEMENT  04/2009   DES to LAD  . CORONARY ARTERY BYPASS GRAFT N/A 04/22/2016   Procedure: CORONARY ARTERY BYPASS GRAFTING (CABG) LIMA to OM1 SVG to OM2 FREE RIMA to RCA  ENDOSCOPIC HARVEST GREATER SAPHENOUS VEIN -Right Thigh;  Surgeon: Alleen Borne, MD;  Location: [facility] OR;  Service: Open Heart Surgery;  Laterality: N/A;  . cryotherapy to AK lesion on nose  01/2010  . eye exam,diabetic  02/2011   Normal diabetic retinopathy screening exam at [facility] eye care in [place], [place].  [name] FEMORAL-FEMORAL BYPASS GRAFT Bilateral 12/30/2016   Procedure: BYPASS GRAFT LEFT FEMORAL-RIGHT FEMORAL ARTERY;  Surgeon: Nada Libman, MD;  Location: [facility] OR;  Service: Vascular;  Laterality: Bilateral;  . ILIAC VEIN ANGIOPLASTY / STENTING  08/22/2016   Abdominal aortogram, bilateral iliac angiogram, bifemoral runoff  .  LOWER EXTREMITY ANGIOGRAM  10/13/2016  . LUMBAR FUSION  2002   "put screws in"  . PERIPHERAL VASCULAR CATHETERIZATION N/A 03/28/2016   Procedure: Abdominal Aortogram;  Surgeon: Lorretta Harp, MD;  Location: [place] CV LAB;  Service: Cardiovascular;  Laterality: N/A;  . PERIPHERAL VASCULAR CATHETERIZATION Bilateral 08/22/2016   Procedure: Lower Extremity Angiography;  Surgeon: Lorretta Harp, MD;  Location: [place] CV LAB;  Service: Cardiovascular;  Laterality: Bilateral;  . PERIPHERAL VASCULAR CATHETERIZATION Left 08/22/2016   Procedure: Peripheral Vascular Intervention;  Surgeon: Lorretta Harp, MD;  Location:  [place] CV LAB;  Service: Cardiovascular;  Laterality: Left;  COMMON ILIAC  . PERIPHERAL VASCULAR CATHETERIZATION Bilateral 10/13/2016   Procedure: Lower Extremity Angiography;  Surgeon: Lorretta Harp, MD;  Location: [place] CV LAB;  Service: Cardiovascular;  Laterality: Bilateral;  . PERIPHERAL VASCULAR CATHETERIZATION Right 10/13/2016   Procedure: Peripheral Vascular Balloon Angioplasty;  Surgeon: Lorretta Harp, MD;  Location: [place] CV LAB;  Service: Cardiovascular;  Laterality: Right;  Right Common ILIac  . POSTERIOR FUSION CERVICAL SPINE  2004  . TEE WITHOUT CARDIOVERSION N/A 04/22/2016   Procedure: TRANSESOPHAGEAL ECHOCARDIOGRAM (TEE);  Surgeon: Gaye Pollack, MD;  Location: [place];  Service: Open Heart Surgery;  Laterality: N/A;  . TRANSTHORACIC ECHOCARDIOGRAM  08/2013   LVH, EF 55-65%, no WMA.  Moderate mitral regurg    There were no vitals filed for this visit.  Subjective Assessment - 11/19/19 1258    Subjective  COVID-19 screening performed upon arrival. Patient reports the R low back feels a little better.    Pertinent History  DM, HTN, PVD    Limitations  Walking;House hold activities    How long can you sit comfortably?  ~1 hr    How long can you walk comfortably?  short distances    Diagnostic tests  x-ray: normal    Patient Stated Goals  decrease pain, return to walking program for diabetes control                       [facility] Adult PT Treatment/Exercise - 11/19/19 0001      Lumbar Exercises: Aerobic   Recumbent Bike  L3 x10 min      Modalities   Modalities  Electrical Stimulation;Moist Heat;Ultrasound      Moist Heat Therapy   Number Minutes Moist Heat  15 Minutes    Moist Heat Location  Lumbar Spine      Electrical Stimulation   Electrical Stimulation Location  right upper gluteal/QL region    Electrical Stimulation Action  premod    Electrical Stimulation Parameters  80-150hz  x 15 min    Electrical Stimulation Goals  Pain;Tone       Ultrasound   Ultrasound Location  RT upper glute    Ultrasound Parameters  Combo 1.5w/cm2 x 10 mis    Ultrasound Goals  Pain      Manual Therapy   Manual Therapy  Soft tissue mobilization    Soft tissue mobilization  STW/M to right SIJ and glute region, TPR to decrease tone                  PT Long Term Goals - 11/06/19 0955      PT LONG TERM GOAL #1   Title  Patient will be independent with HEP    Time  4    Period  Weeks    Status  On-going      PT LONG  TERM GOAL #2   Title  Patient will report ability to perform ADLs and bending/lifting tasks with right low back pain less than or equal to 2/10    Time  4    Period  Weeks    Status  On-going      PT LONG TERM GOAL #3   Title  Patient will demonstrate 4+/5 or greater bilateral LE MMT to improve stability during functional tasks.    Time  4    Period  Weeks    Status  On-going      PT LONG TERM GOAL #4   Title  Patient will report ability to walk 30 minutes or greater with low back pain less than or equal to 3/10 to return to walking program for diabetes control.    Time  4    Period  Weeks    Status  On-going            Plan - 11/19/19 1307    Clinical Impression Statement  Pt arrived today doing better since starting PT and reports mainly tightness and soreness in RT glute/LB. US/ Estim combo performed to RT glute f/b STW and did well with Good release. Decreased pain end of session.    Personal Factors and Comorbidities  Comorbidity 1    Comorbidities  DM    Examination-Activity Limitations  Bend;Carry;Locomotion Level;Lift    Stability/Clinical Decision Making  Stable/Uncomplicated    Rehab Potential  Good    PT Frequency  2x / week    PT Duration  4 weeks    PT Treatment/Interventions  ADLs/Self Care Home Management;Iontophoresis 4mg /ml Dexamethasone;Moist Heat;Traction;Ultrasound;Cryotherapy;Electrical Stimulation;Therapeutic exercise;Therapeutic activities;Neuromuscular  re-education;Patient/family education;Passive range of motion;Taping;Spinal Manipulations    PT Next Visit Plan  cont with POC for  low back stretching and strengthening, STW/M and TPR to right QL/upper gluteal trigger point, modalities PRN for pain relief.    PT Home Exercise Plan  see patient education section       Patient will benefit from skilled therapeutic intervention in order to improve the following deficits and impairments:  Decreased activity tolerance, Decreased strength, Decreased range of motion, Difficulty walking, Pain, Impaired tone  Visit Diagnosis: Chronic right-sided low back pain, unspecified whether sciatica present  Muscle weakness (generalized)  Difficulty in walking, not elsewhere classified     Problem List Patient Active Problem List   Diagnosis Date Noted  . Atherosclerosis of native arteries of extremities with intermittent claudication, right leg (HCC) 12/30/2016  . Right-sided carotid artery disease (HCC) 11/08/2016  . S/P arterial stent, 08/22/16 Lt CIA PTA/Stent  08/23/2016  . PVD (peripheral vascular disease) (HCC) 05/16/2016  . Chronic renal insufficiency, stage III (moderate) 05/16/2016  . S/P CABG x 3 04/22/2016  . Abnormal stress test   . Claudication (HCC)   . Preventative health care 07/02/2014  . Hypertension associated with diabetes (HCC) 12/03/2013  . Normocytic anemia 12/03/2013  . Health maintenance examination 08/16/2013  . Systolic murmur 08/16/2013  . Diabetes mellitus with complication, with long-term current use of insulin (HCC) 11/15/2012  . Prostate cancer screening 03/14/2012  . Actinic keratosis 03/09/2011  . Coronary atherosclerosis 01/25/2011  . PAIN IN SOFT TISSUES OF LIMB 11/08/2010  . Statin intolerance 04/30/2009  . Acute myocardial infarction, subendocardial infarction (HCC) 04/30/2009  . COPD 04/29/2009    Hatsue Sime,CHRIS, PTA 11/19/2019, 5:56 PM  [facility] [address] [place], [place], [address] Phone: [phone]   Fax:  [phone]  Name: Donley RedderJerry E Ek  MRN: [id number] Date of Birth: Sep 14, 1946

## 2019-11-25 ENCOUNTER — Encounter: Payer: Self-pay | Admitting: Physical Therapy

## 2019-11-25 ENCOUNTER — Other Ambulatory Visit: Payer: Self-pay

## 2019-11-25 ENCOUNTER — Ambulatory Visit: Payer: Medicare Other | Admitting: Physical Therapy

## 2019-11-25 DIAGNOSIS — G8929 Other chronic pain: Secondary | ICD-10-CM

## 2019-11-25 DIAGNOSIS — R262 Difficulty in walking, not elsewhere classified: Secondary | ICD-10-CM | POA: Diagnosis not present

## 2019-11-25 DIAGNOSIS — M6281 Muscle weakness (generalized): Secondary | ICD-10-CM | POA: Diagnosis not present

## 2019-11-25 DIAGNOSIS — M545 Low back pain, unspecified: Secondary | ICD-10-CM

## 2019-11-25 NOTE — Therapy (Addendum)
[facility] [place], [place], [address] Phone: [phone]   Fax:  [phone]  Physical Therapy Treatment PHYSICAL THERAPY DISCHARGE SUMMARY  Visits from Start of Care: 6  Current functional level related to goals / functional outcomes: See below   Remaining deficits: See goals   Education / Equipment: HEP Plan: Patient agrees to discharge.  Patient goals were not met. Patient is being discharged due to lack of progress.  ?????     Patient Details  Name: Derrick [name] MRN: [id number] Date of Birth: [birth year] Referring Provider (PT): Donnald Garre, MD   Encounter Date: 11/25/2019  PT End of Session - 11/25/19 1120    Visit Number  6    Number of Visits  8    Date for PT Re-Evaluation  11/25/19    Authorization Type  FOTO; progress note at 10th visit; KX modifier at 15th visit    PT Start Time  1032    PT Stop Time  1117    PT Time Calculation (min)  45 min    Activity Tolerance  Patient tolerated treatment well    Behavior During Therapy  [facility] for tasks assessed/performed       Past Medical History:  Diagnosis Date  . Anginal pain ([place])    occ   . Chronic renal insufficiency, stage III (moderate) 2013   CrCl est high 50s (borderline stage II/III pt denies 12/27/16  . COPD (chronic obstructive pulmonary disease) ([place])   . Coronary atherosclerosis of native coronary vessel    S/P NSTEMI 04/11/2009--DEL stent to LAD.  Nuclear stress test 11/2010 NEG, EF normal  . Diabetic peripheral neuropathy (HCC)    Painful; 1 vicodin bid very helpful (pt resistant to alternative tx's)  . Early cataracts, bilateral 04/24/2012   [name] in [place], [place]  . GERD (gastroesophageal reflux disease)   . Heart murmur    patient denies  . Herpes zoster    Two separate outbreaks--one on left axillary/back region, one on right axillary/back region  . Hyperlipidemia   . Hypertension   . Mitral regurgitation 08/2013    Moderate (echo)  . Myocardial infarction ([place]) 2010  . Peripheral arterial disease ([place])   . S/P arterial stent, 08/22/16 Lt CIA PTA/Stent  08/23/2016  . Type II diabetes mellitus ([place])     Past Surgical History:  Procedure Laterality Date  . ANTERIOR CERVICAL DECOMP/DISCECTOMY FUSION  2004 X 2   "1st one collapsed; had to go in 1 month later & do the front and back"  . BACK SURGERY    . CARDIAC CATHETERIZATION N/A 03/28/2016   Procedure: Left Heart Cath and Coronary Angiography;  Surgeon: Lorretta Harp, MD;  Location: [place] [facility];  Service: Cardiovascular;  Laterality: N/A;  . CARDIOVASCULAR STRESS TEST  11/2010   No ischemia/normal EF  . CARPAL TUNNEL RELEASE Left 05/28/2015   Procedure: LEFT HAND CARPAL TUNNEL RELEASE;  Surgeon: Iran Planas, MD;  Location: [place];  Service: Orthopedics;  Laterality: Left;  . COLONOSCOPY  12/2010   Normal screening colonoscopy; rpt 10 yrs  . CORONARY ANGIOPLASTY WITH STENT PLACEMENT  04/2009   DES to LAD  . CORONARY ARTERY BYPASS GRAFT N/A 04/22/2016   Procedure: CORONARY ARTERY BYPASS GRAFTING (CABG) LIMA to OM1 SVG to OM2 FREE RIMA to RCA  ENDOSCOPIC HARVEST GREATER SAPHENOUS VEIN -Right Thigh;  Surgeon: Gaye Pollack, MD;  Location: [place];  Service: Open Heart Surgery;  Laterality: N/A;  .  cryotherapy to AK lesion on nose  01/2010  . eye exam,diabetic  02/2011   Normal diabetic retinopathy screening exam at [facility] in [place], [place].  [name] FEMORAL-FEMORAL BYPASS GRAFT Bilateral 12/30/2016   Procedure: BYPASS GRAFT LEFT FEMORAL-RIGHT FEMORAL ARTERY;  Surgeon: Serafina Mitchell, MD;  Location: [place];  Service: Vascular;  Laterality: Bilateral;  . ILIAC VEIN ANGIOPLASTY / STENTING  08/22/2016   Abdominal aortogram, bilateral iliac angiogram, bifemoral runoff  . LOWER EXTREMITY ANGIOGRAM  10/13/2016  . LUMBAR FUSION  2002   "put screws in"  . PERIPHERAL VASCULAR CATHETERIZATION N/A 03/28/2016   Procedure: Abdominal Aortogram;   Surgeon: Lorretta Harp, MD;  Location: [place] [facility];  Service: Cardiovascular;  Laterality: N/A;  . PERIPHERAL VASCULAR CATHETERIZATION Bilateral 08/22/2016   Procedure: Lower Extremity Angiography;  Surgeon: Lorretta Harp, MD;  Location: [place] [facility];  Service: Cardiovascular;  Laterality: Bilateral;  . PERIPHERAL VASCULAR CATHETERIZATION Left 08/22/2016   Procedure: Peripheral Vascular Intervention;  Surgeon: Lorretta Harp, MD;  Location: [place] [facility];  Service: Cardiovascular;  Laterality: Left;  COMMON ILIAC  . PERIPHERAL VASCULAR CATHETERIZATION Bilateral 10/13/2016   Procedure: Lower Extremity Angiography;  Surgeon: Lorretta Harp, MD;  Location: [place] [facility];  Service: Cardiovascular;  Laterality: Bilateral;  . PERIPHERAL VASCULAR CATHETERIZATION Right 10/13/2016   Procedure: Peripheral Vascular Balloon Angioplasty;  Surgeon: Lorretta Harp, MD;  Location: [place] [facility];  Service: Cardiovascular;  Laterality: Right;  Right Common ILIac  . POSTERIOR FUSION CERVICAL SPINE  2004  . TEE WITHOUT CARDIOVERSION N/A 04/22/2016   Procedure: TRANSESOPHAGEAL ECHOCARDIOGRAM (TEE);  Surgeon: Gaye Pollack, MD;  Location: [place];  Service: Open Heart Surgery;  Laterality: N/A;  . TRANSTHORACIC ECHOCARDIOGRAM  08/2013   LVH, EF 55-65%, no WMA.  Moderate mitral regurg    There were no vitals filed for this visit.  Subjective Assessment - 11/25/19 1034    Subjective  COVID-19 screening performed upon arrival. Patient reports the R low back feels a little better but is to return to PT as it has not helped very much per patient report.    Pertinent History  DM, HTN, PVD    Limitations  Walking;House hold activities    How long can you sit comfortably?  ~1 hr    How long can you stand comfortably?  ~1 hr    How long can you walk comfortably?  short distances    Diagnostic tests  x-ray: normal    Patient Stated Goals  decrease pain, return to walking program for  diabetes control    Currently in Pain?  Yes    Pain Score  2     Pain Location  Back    Pain Orientation  Right;Lower    Pain Descriptors / Indicators  Discomfort    Pain Type  Chronic pain    Pain Onset  More than a month ago    Pain Frequency  Intermittent         [facility] PT Assessment - 11/25/19 0001      Assessment   Medical Diagnosis  Low back pain    Referring Provider (PT)  Donnald Garre, MD    Next MD Visit  After PT    Prior Therapy  no      Precautions   Precautions  None      ROM / Strength   AROM / PROM / Strength  Strength      Strength  Overall Strength  Within functional limits for tasks performed    Strength Assessment Site  Hip;Knee    Right/Left Hip  Right;Left    Right Hip Flexion  4+/5    Right Hip ABduction  4+/5   "a little" pain   Left Hip Flexion  4+/5    Left Hip ABduction  4+/5    Right/Left Knee  Right;Left    Right Knee Flexion  4+/5    Right Knee Extension  4+/5    Left Knee Flexion  4+/5    Left Knee Extension  4+/5                   [facility] Adult PT Treatment/Exercise - 11/25/19 0001      Lumbar Exercises: Aerobic   Recumbent Bike  L4 x10 min      Modalities   Modalities  Electrical Stimulation;Moist Heat;Ultrasound      Moist Heat Therapy   Number Minutes Moist Heat  15 Minutes    Moist Heat Location  Lumbar Spine      Electrical Stimulation   Electrical Stimulation Location  R QL/lumbar paraspinals    Electrical Stimulation Action  Pre-Mod    Electrical Stimulation Parameters  80-150 hz x15 min    Electrical Stimulation Goals  Pain;Tone      Ultrasound   Ultrasound Location  R QL    Ultrasound Parameters  Combo 1.5 w/cm2, 100%, 1 mhz x10 min    Ultrasound Goals  Pain      Manual Therapy   Manual Therapy  Soft tissue mobilization    Soft tissue mobilization  STW/TPR to R QL, lumbar paraspinals to reduce tone and pain                  PT Long Term Goals - 11/25/19 1114      PT LONG TERM GOAL  #1   Title  Patient will be independent with HEP    Time  4    Period  Weeks    Status  Achieved      PT LONG TERM GOAL #2   Title  Patient will report ability to perform ADLs and bending/lifting tasks with right low back pain less than or equal to 2/10    Time  4    Period  Weeks    Status  Achieved      PT LONG TERM GOAL #3   Title  Patient will demonstrate 4+/5 or greater bilateral LE MMT to improve stability during functional tasks.    Time  4    Period  Weeks    Status  Achieved      PT LONG TERM GOAL #4   Title  Patient will report ability to walk 30 minutes or greater with low back pain less than or equal to 3/10 to return to walking program for diabetes control.    Time  4    Period  Weeks    Status  Not Met            Plan - 11/25/19 1125    Clinical Impression Statement  Patient presented in clinic with reports of continued R LBP which limits him with activities such as bending to don sock to R foot. Continued increased tone palpable in R QL, lumbar paraspinals with intermittant TPs notable as well. Increased pressure required during manual therapy to reduce TPs and muscle tone. MMT of BLE assessed as 4+/5 throughout with minimal pain reported R SL hip  abduction only. Normal modalities response noted following removal of the modaltiies.    Personal Factors and Comorbidities  Comorbidity 1    Comorbidities  DM    Examination-Activity Limitations  Bend;Carry;Locomotion Level;Lift    Stability/Clinical Decision Making  Stable/Uncomplicated    Rehab Potential  Good    PT Frequency  2x / week    PT Duration  4 weeks    PT Treatment/Interventions  ADLs/Self Care Home Management;Iontophoresis '4mg'$ /ml Dexamethasone;Moist Heat;Traction;Ultrasound;Cryotherapy;Electrical Stimulation;Therapeutic exercise;Therapeutic activities;Neuromuscular re-education;Patient/family education;Passive range of motion;Taping;Spinal Manipulations    PT Next Visit Plan  Place on hold for MD return  visit 12/2019.    PT Home Exercise Plan  see patient education section    Consulted and Agree with Plan of Care  Patient       Patient will benefit from skilled therapeutic intervention in order to improve the following deficits and impairments:  Decreased activity tolerance, Decreased strength, Decreased range of motion, Difficulty walking, Pain, Impaired tone  Visit Diagnosis: Chronic right-sided low back pain, unspecified whether sciatica present  Muscle weakness (generalized)  Difficulty in walking, not elsewhere classified     Problem List Patient Active Problem List   Diagnosis Date Noted  . Atherosclerosis of native arteries of extremities with intermittent claudication, right leg ([place]) 12/30/2016  . Right-sided carotid artery disease ([place]) 11/08/2016  . S/P arterial stent, 08/22/16 Lt CIA PTA/Stent  08/23/2016  . PVD (peripheral vascular disease) ([place]) 05/16/2016  . Chronic renal insufficiency, stage III (moderate) 05/16/2016  . S/P CABG x 3 04/22/2016  . Abnormal stress test   . Claudication ([place])   . Preventative health care 07/02/2014  . Hypertension associated with diabetes ([place]) 12/03/2013  . Normocytic anemia 12/03/2013  . Health maintenance examination 08/16/2013  . Systolic murmur 45/36/4680  . Diabetes mellitus with complication, with long-term current use of insulin ([place]) 11/15/2012  . Prostate cancer screening 03/14/2012  . Actinic keratosis 03/09/2011  . Coronary atherosclerosis 01/25/2011  . PAIN IN SOFT TISSUES OF LIMB 11/08/2010  . Statin intolerance 04/30/2009  . Acute myocardial infarction, subendocardial infarction ([place]) 04/30/2009  . COPD 04/29/2009    Standley Brooking, PTA 11/25/19 11:31 AM   [facility] [address] [place], [place], [address] Phone: [phone]   Fax:  [phone]  Name: Derrick [name] MRN: [id number] Date of Birth: [birth year]

## 2019-12-10 DIAGNOSIS — G894 Chronic pain syndrome: Secondary | ICD-10-CM | POA: Diagnosis not present

## 2019-12-10 DIAGNOSIS — G629 Polyneuropathy, unspecified: Secondary | ICD-10-CM | POA: Diagnosis not present

## 2019-12-10 DIAGNOSIS — Z79899 Other long term (current) drug therapy: Secondary | ICD-10-CM | POA: Diagnosis not present

## 2019-12-14 ENCOUNTER — Other Ambulatory Visit: Payer: Self-pay | Admitting: Cardiovascular Disease

## 2019-12-17 DIAGNOSIS — I10 Essential (primary) hypertension: Secondary | ICD-10-CM | POA: Diagnosis not present

## 2019-12-17 DIAGNOSIS — E1140 Type 2 diabetes mellitus with diabetic neuropathy, unspecified: Secondary | ICD-10-CM | POA: Diagnosis not present

## 2019-12-17 DIAGNOSIS — Z955 Presence of coronary angioplasty implant and graft: Secondary | ICD-10-CM | POA: Diagnosis not present

## 2019-12-17 DIAGNOSIS — E7800 Pure hypercholesterolemia, unspecified: Secondary | ICD-10-CM | POA: Diagnosis not present

## 2019-12-17 DIAGNOSIS — Z794 Long term (current) use of insulin: Secondary | ICD-10-CM | POA: Diagnosis not present

## 2019-12-21 ENCOUNTER — Other Ambulatory Visit: Payer: Self-pay | Admitting: Cardiovascular Disease

## 2019-12-24 DIAGNOSIS — M79676 Pain in unspecified toe(s): Secondary | ICD-10-CM | POA: Diagnosis not present

## 2019-12-24 DIAGNOSIS — L84 Corns and callosities: Secondary | ICD-10-CM | POA: Diagnosis not present

## 2019-12-24 DIAGNOSIS — E1142 Type 2 diabetes mellitus with diabetic polyneuropathy: Secondary | ICD-10-CM | POA: Diagnosis not present

## 2019-12-24 DIAGNOSIS — B351 Tinea unguium: Secondary | ICD-10-CM | POA: Diagnosis not present

## 2019-12-24 NOTE — Telephone Encounter (Signed)
Rx(s) sent to pharmacy electronically.  

## 2020-01-02 DIAGNOSIS — E1140 Type 2 diabetes mellitus with diabetic neuropathy, unspecified: Secondary | ICD-10-CM | POA: Diagnosis not present

## 2020-01-02 DIAGNOSIS — Z794 Long term (current) use of insulin: Secondary | ICD-10-CM | POA: Diagnosis not present

## 2020-01-02 DIAGNOSIS — E119 Type 2 diabetes mellitus without complications: Secondary | ICD-10-CM | POA: Diagnosis not present

## 2020-01-08 DIAGNOSIS — G894 Chronic pain syndrome: Secondary | ICD-10-CM | POA: Diagnosis not present

## 2020-01-08 DIAGNOSIS — Z79899 Other long term (current) drug therapy: Secondary | ICD-10-CM | POA: Diagnosis not present

## 2020-01-08 DIAGNOSIS — Z9189 Other specified personal risk factors, not elsewhere classified: Secondary | ICD-10-CM | POA: Diagnosis not present

## 2020-01-08 DIAGNOSIS — G629 Polyneuropathy, unspecified: Secondary | ICD-10-CM | POA: Diagnosis not present

## 2020-01-17 DIAGNOSIS — Z794 Long term (current) use of insulin: Secondary | ICD-10-CM | POA: Diagnosis not present

## 2020-01-17 DIAGNOSIS — E1140 Type 2 diabetes mellitus with diabetic neuropathy, unspecified: Secondary | ICD-10-CM | POA: Diagnosis not present

## 2020-01-28 DIAGNOSIS — I10 Essential (primary) hypertension: Secondary | ICD-10-CM | POA: Diagnosis not present

## 2020-01-28 DIAGNOSIS — E7800 Pure hypercholesterolemia, unspecified: Secondary | ICD-10-CM | POA: Diagnosis not present

## 2020-01-28 DIAGNOSIS — E1140 Type 2 diabetes mellitus with diabetic neuropathy, unspecified: Secondary | ICD-10-CM | POA: Diagnosis not present

## 2020-02-05 DIAGNOSIS — G894 Chronic pain syndrome: Secondary | ICD-10-CM | POA: Diagnosis not present

## 2020-02-05 DIAGNOSIS — Z79899 Other long term (current) drug therapy: Secondary | ICD-10-CM | POA: Diagnosis not present

## 2020-02-05 DIAGNOSIS — G629 Polyneuropathy, unspecified: Secondary | ICD-10-CM | POA: Diagnosis not present

## 2020-02-14 DIAGNOSIS — Z794 Long term (current) use of insulin: Secondary | ICD-10-CM | POA: Diagnosis not present

## 2020-02-14 DIAGNOSIS — E1140 Type 2 diabetes mellitus with diabetic neuropathy, unspecified: Secondary | ICD-10-CM | POA: Diagnosis not present

## 2020-03-08 IMAGING — CT CT CHEST LUNG CANCER SCREENING LOW DOSE W/O CM
2 of 5 series · 15 of 40 positions shown, 18 images · non-contrast
Comparison: Low-dose lung cancer screening CT chest dated
02/17/2017

CLINICAL DATA: 71-year-old male former smoker, quit 9 years ago,
with 50 pack-year history of smoking, for follow-up lung cancer
screening

EXAM:
CT CHEST WITHOUT CONTRAST LOW-DOSE FOR LUNG CANCER SCREENING
TECHNIQUE: Multidetector CT imaging of the chest was performed following the
standard protocol without IV contrast.

[Series 4: lung 1.00 br60 · axial · 0.65mm/px · z∈[-1058,-779]mm · 12 of 311 slices shown, 15 images]
[im 16/311  mediastinal]
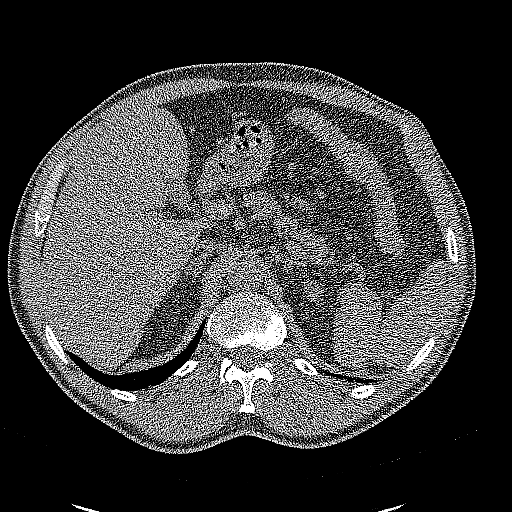
[im 16/311  lung]
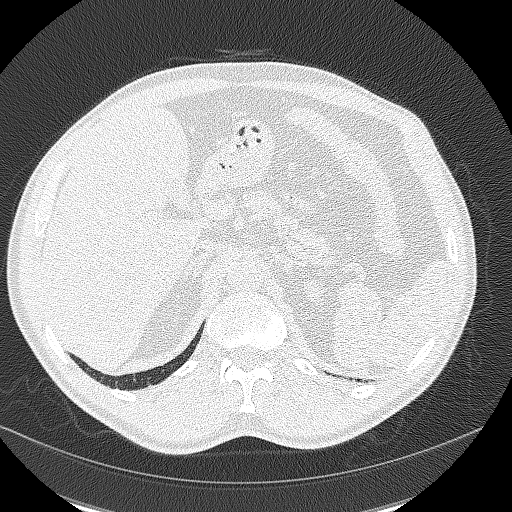
[im 47/311  lung]
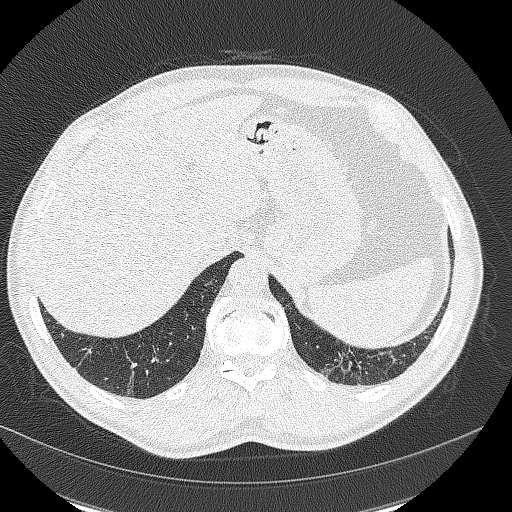
[im 63/311  lung]
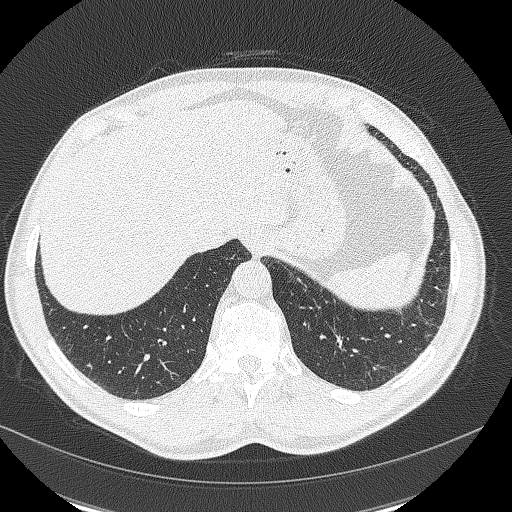
[im 94/311  lung]
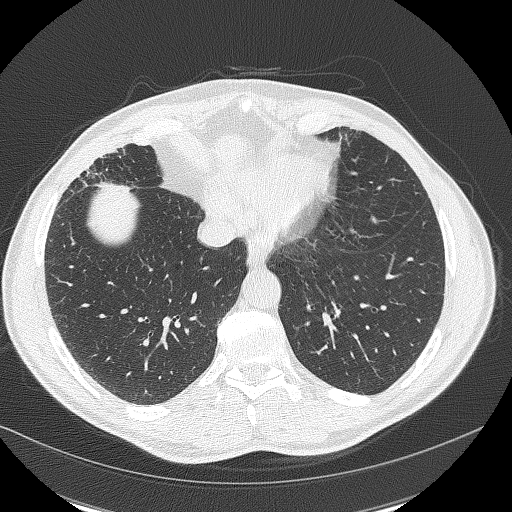
[im 125/311  mediastinal]
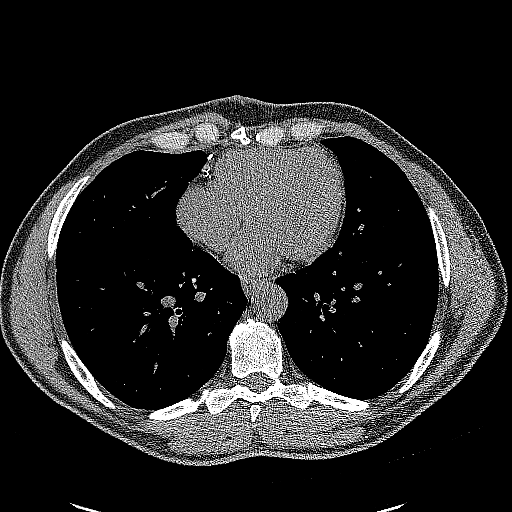
[im 125/311  lung]
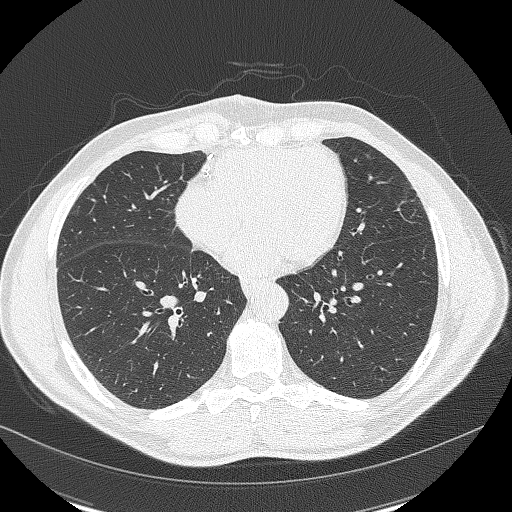
[im 140/311  lung]
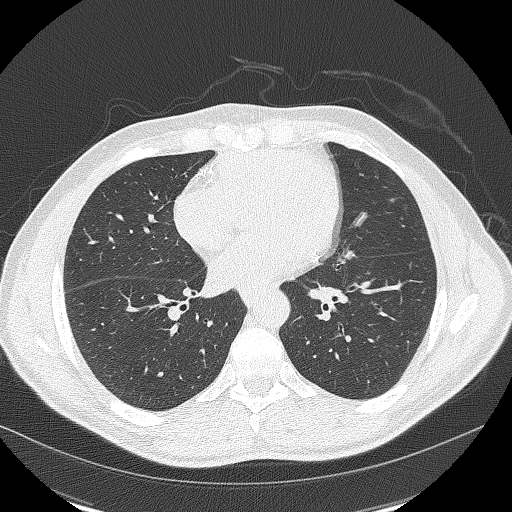
[im 171/311  lung]
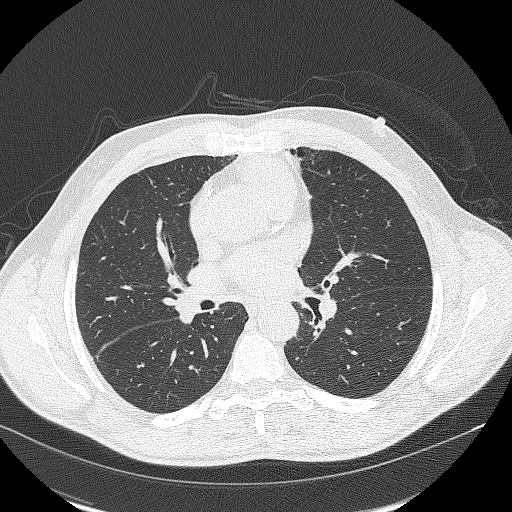
[im 187/311  lung]
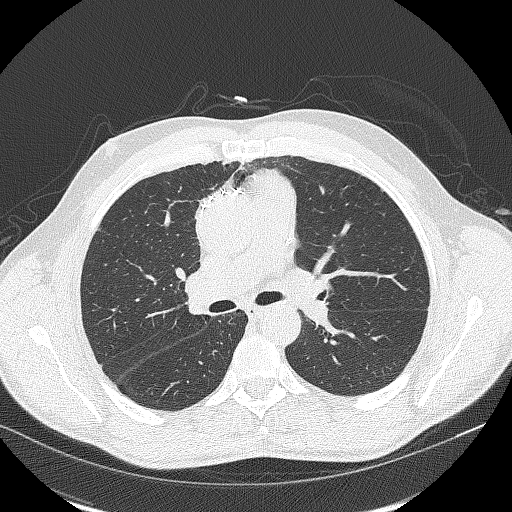
[im 218/311  mediastinal]
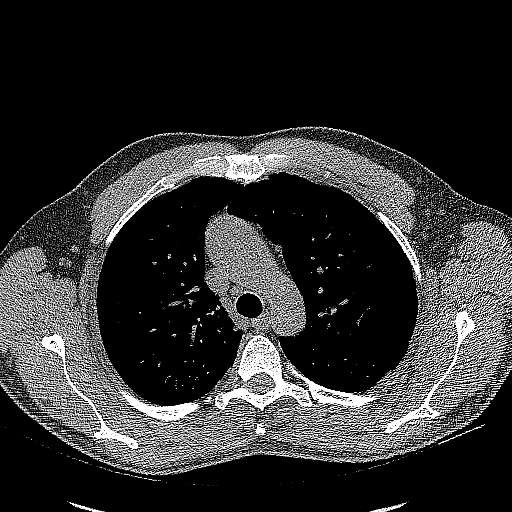
[im 218/311  lung]
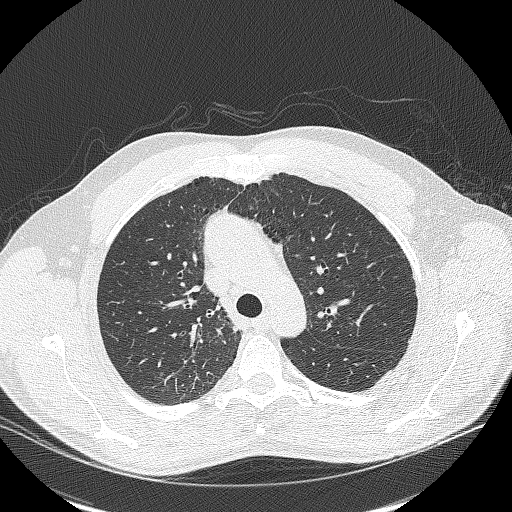
[im 249/311  lung]
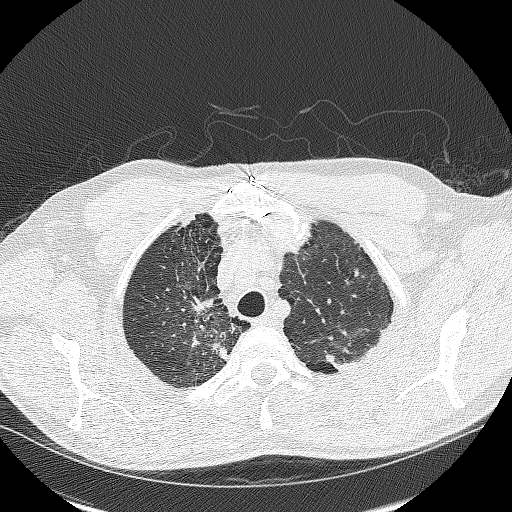
[im 264/311  lung]
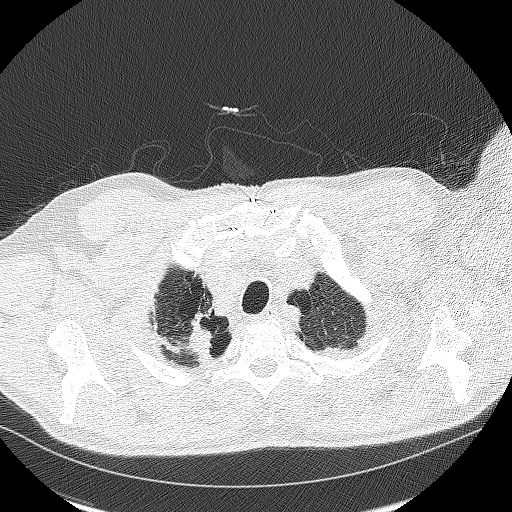
[im 295/311  lung]
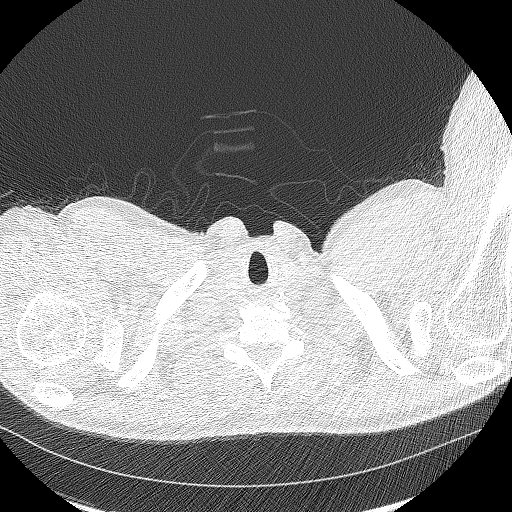

[Series 5: lung 1.00 br44 cor · coronal · 0.60mm/px · 3 of 274 slices shown]
[im 55/274  lung]
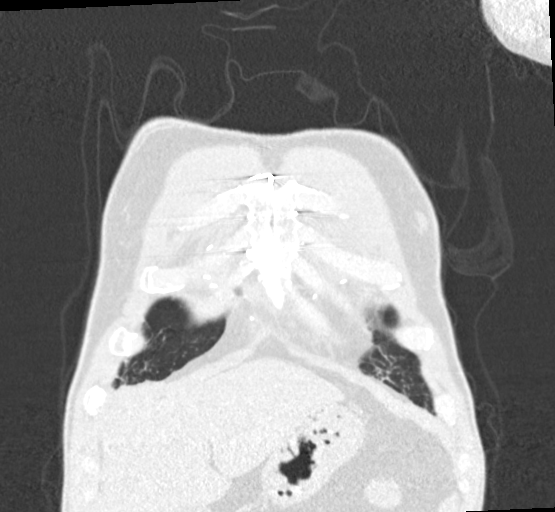
[im 110/274  lung]
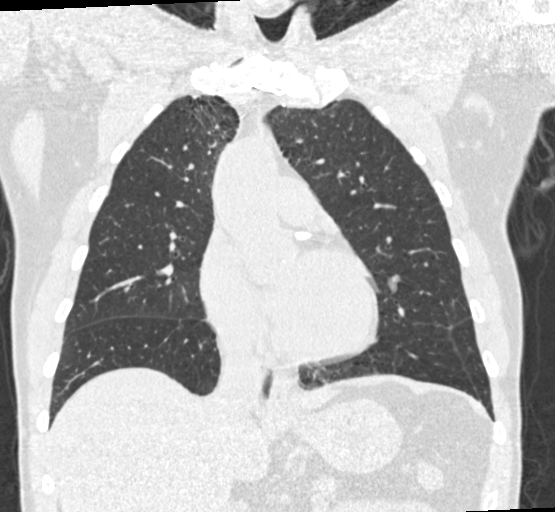
[im 164/274  lung]
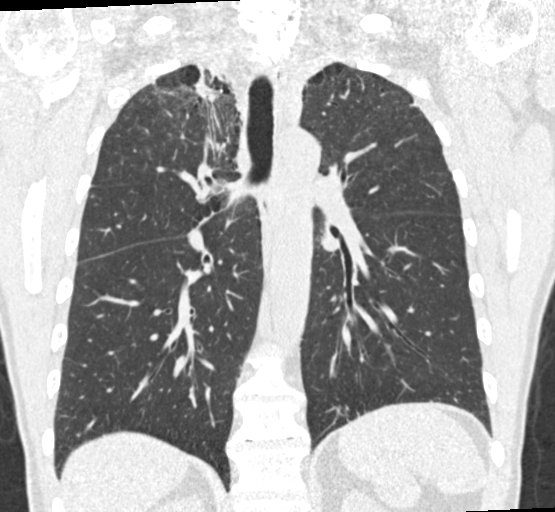

[15 of 40 positions shown; findings below may reference images not displayed]

FINDINGS: Cardiovascular: Heart is normal in size.  No pericardial effusion.

No evidence of thoracic aortic aneurysm. Mild atherosclerotic
calcifications of the aortic arch.

Three vessel atherosclerosis. Postsurgical changes related to prior
CABG.

Mediastinum/Nodes: No suspicious mediastinal lymphadenopathy.

Visualized thyroid is unremarkable.

Lungs/Pleura: Biapical pleural-parenchymal scarring, right greater
than left.

However, there is progressive nodular opacity along the right lung
apex (coronal image 162), new/increased from the prior, volumetric
mean 15.5 mm.

Mild paraseptal emphysematous changes, upper lobe predominant.

No focal consolidation.

No pleural effusion or pneumothorax.

Upper Abdomen: Visualized upper abdomen is grossly unremarkable.

Musculoskeletal: Degenerative changes of the lower cervical/upper
thoracic spine. Cervical spine fixation hardware, incompletely
visualized. Median sternotomy.
IMPRESSION: Lung-RADS 4B, suspicious. Additional imaging evaluation or
consultation with Pulmonology or Thoracic Surgery recommended.

Progressive nodular opacity along the right lung apex, new/increased
from the prior, volumetric mean 15.5 mm. Consider PET-CT for initial
evaluation.

These results will be called to the ordering clinician or
representative by the Radiologist Assistant, and communication
documented in the PACS or zVision Dashboard.

Aortic Atherosclerosis (NOJ4C-P6Q.Q) and Emphysema (NOJ4C-A7X.A).

## 2020-03-16 DIAGNOSIS — E1140 Type 2 diabetes mellitus with diabetic neuropathy, unspecified: Secondary | ICD-10-CM | POA: Diagnosis not present

## 2020-03-16 DIAGNOSIS — Z794 Long term (current) use of insulin: Secondary | ICD-10-CM | POA: Diagnosis not present

## 2020-03-20 ENCOUNTER — Other Ambulatory Visit: Payer: Self-pay | Admitting: Cardiovascular Disease

## 2020-03-31 DIAGNOSIS — E1142 Type 2 diabetes mellitus with diabetic polyneuropathy: Secondary | ICD-10-CM | POA: Diagnosis not present

## 2020-03-31 DIAGNOSIS — M79676 Pain in unspecified toe(s): Secondary | ICD-10-CM | POA: Diagnosis not present

## 2020-03-31 DIAGNOSIS — L84 Corns and callosities: Secondary | ICD-10-CM | POA: Diagnosis not present

## 2020-03-31 DIAGNOSIS — B351 Tinea unguium: Secondary | ICD-10-CM | POA: Diagnosis not present

## 2020-04-06 DIAGNOSIS — G629 Polyneuropathy, unspecified: Secondary | ICD-10-CM | POA: Diagnosis not present

## 2020-04-06 DIAGNOSIS — G894 Chronic pain syndrome: Secondary | ICD-10-CM | POA: Diagnosis not present

## 2020-04-06 DIAGNOSIS — Z79899 Other long term (current) drug therapy: Secondary | ICD-10-CM | POA: Diagnosis not present

## 2020-04-15 DIAGNOSIS — E1140 Type 2 diabetes mellitus with diabetic neuropathy, unspecified: Secondary | ICD-10-CM | POA: Diagnosis not present

## 2020-04-15 DIAGNOSIS — Z794 Long term (current) use of insulin: Secondary | ICD-10-CM | POA: Diagnosis not present

## 2020-05-16 DIAGNOSIS — E1140 Type 2 diabetes mellitus with diabetic neuropathy, unspecified: Secondary | ICD-10-CM | POA: Diagnosis not present

## 2020-05-16 DIAGNOSIS — Z794 Long term (current) use of insulin: Secondary | ICD-10-CM | POA: Diagnosis not present

## 2020-06-05 DIAGNOSIS — Z79899 Other long term (current) drug therapy: Secondary | ICD-10-CM | POA: Diagnosis not present

## 2020-06-05 DIAGNOSIS — G629 Polyneuropathy, unspecified: Secondary | ICD-10-CM | POA: Diagnosis not present

## 2020-06-05 DIAGNOSIS — G894 Chronic pain syndrome: Secondary | ICD-10-CM | POA: Diagnosis not present

## 2020-06-26 ENCOUNTER — Other Ambulatory Visit: Payer: Self-pay

## 2020-06-26 ENCOUNTER — Other Ambulatory Visit (HOSPITAL_COMMUNITY): Payer: Self-pay | Admitting: Cardiovascular Disease

## 2020-06-26 ENCOUNTER — Ambulatory Visit (HOSPITAL_COMMUNITY)
Admission: RE | Admit: 2020-06-26 | Discharge: 2020-06-26 | Disposition: A | Discharge status: Home / Self Care | Payer: Medicare Other | Source: Ambulatory Visit | Attending: Cardiovascular Disease | Admitting: Cardiovascular Disease

## 2020-06-26 DIAGNOSIS — I6521 Occlusion and stenosis of right carotid artery: Secondary | ICD-10-CM

## 2020-06-26 DIAGNOSIS — I6523 Occlusion and stenosis of bilateral carotid arteries: Secondary | ICD-10-CM

## 2020-07-01 ENCOUNTER — Other Ambulatory Visit: Payer: Self-pay

## 2020-07-01 DIAGNOSIS — I6523 Occlusion and stenosis of bilateral carotid arteries: Secondary | ICD-10-CM

## 2020-07-03 DIAGNOSIS — E1140 Type 2 diabetes mellitus with diabetic neuropathy, unspecified: Secondary | ICD-10-CM | POA: Diagnosis not present

## 2020-07-03 DIAGNOSIS — Z794 Long term (current) use of insulin: Secondary | ICD-10-CM | POA: Diagnosis not present

## 2020-08-03 DIAGNOSIS — E1140 Type 2 diabetes mellitus with diabetic neuropathy, unspecified: Secondary | ICD-10-CM | POA: Diagnosis not present

## 2020-08-03 DIAGNOSIS — Z794 Long term (current) use of insulin: Secondary | ICD-10-CM | POA: Diagnosis not present

## 2020-08-06 DIAGNOSIS — Z79899 Other long term (current) drug therapy: Secondary | ICD-10-CM | POA: Diagnosis not present

## 2020-08-06 DIAGNOSIS — M79676 Pain in unspecified toe(s): Secondary | ICD-10-CM | POA: Diagnosis not present

## 2020-08-06 DIAGNOSIS — G894 Chronic pain syndrome: Secondary | ICD-10-CM | POA: Diagnosis not present

## 2020-08-06 DIAGNOSIS — E1142 Type 2 diabetes mellitus with diabetic polyneuropathy: Secondary | ICD-10-CM | POA: Diagnosis not present

## 2020-08-06 DIAGNOSIS — B351 Tinea unguium: Secondary | ICD-10-CM | POA: Diagnosis not present

## 2020-08-06 DIAGNOSIS — G629 Polyneuropathy, unspecified: Secondary | ICD-10-CM | POA: Diagnosis not present

## 2020-08-06 DIAGNOSIS — L84 Corns and callosities: Secondary | ICD-10-CM | POA: Diagnosis not present

## 2020-09-02 DIAGNOSIS — R110 Nausea: Secondary | ICD-10-CM | POA: Diagnosis not present

## 2020-09-02 DIAGNOSIS — J40 Bronchitis, not specified as acute or chronic: Secondary | ICD-10-CM | POA: Diagnosis not present

## 2020-09-02 DIAGNOSIS — R05 Cough: Secondary | ICD-10-CM | POA: Diagnosis not present

## 2020-09-02 DIAGNOSIS — R197 Diarrhea, unspecified: Secondary | ICD-10-CM | POA: Diagnosis not present

## 2020-09-03 DIAGNOSIS — E1140 Type 2 diabetes mellitus with diabetic neuropathy, unspecified: Secondary | ICD-10-CM | POA: Diagnosis not present

## 2020-09-03 DIAGNOSIS — Z794 Long term (current) use of insulin: Secondary | ICD-10-CM | POA: Diagnosis not present

## 2020-09-04 ENCOUNTER — Telehealth: Payer: Self-pay | Admitting: Infectious Diseases

## 2020-09-04 ENCOUNTER — Other Ambulatory Visit: Payer: Self-pay | Admitting: Infectious Diseases

## 2020-09-04 DIAGNOSIS — N183 Chronic kidney disease, stage 3 unspecified: Secondary | ICD-10-CM

## 2020-09-04 DIAGNOSIS — Z794 Long term (current) use of insulin: Secondary | ICD-10-CM

## 2020-09-04 DIAGNOSIS — U071 COVID-19: Secondary | ICD-10-CM

## 2020-09-04 DIAGNOSIS — Z951 Presence of aortocoronary bypass graft: Secondary | ICD-10-CM

## 2020-09-04 NOTE — Telephone Encounter (Signed)
Called to Discuss with patient about Covid symptoms and the use of the monoclonal antibody infusion for those with mild to moderate Covid symptoms and at a high risk of hospitalization.     Pt appears to qualify for this infusion due to co-morbid conditions and/or a member of an at-risk group in accordance with the FDA Emergency Use Authorization.    Derrick [name] started with symptoms last Wednesday the 23rd.  Positive test results in care everywhere the following days.  He is a weak and too sick to drive and declined an appointment today.  Breathing is overall okay just having mostly fatigue and weakness.  His wife is passed 10 days in her illness and I think too sick to get treatment.  We will schedule him for Saturday afternoon and arrange transportation.  Should he worsen he will need ER evaluation and consideration for admission.  His son is bringing over a pulse oximeter.  I told him if his oxygen is less than 90 he should go to the emergency room to be evaluated with his wife.    Derrick Alberts, MSN, NP-C [facility]  [email or web address] Pager: [phone] Office: [phone] [facility] Main Line: [phone]

## 2020-09-04 NOTE — Progress Notes (Signed)
I connected by phone with Derrick [name] on 09/04/2020 at 12:59 PM to discuss the potential use of a new treatment for mild to moderate COVID-19 viral infection in non-hospitalized patients.  This patient is a 74 y.o. male that meets the FDA criteria for Emergency Use Authorization of COVID monoclonal antibody casirivimab/imdevimab or bamlanivimab/eteseviamb.  Has a (+) direct SARS-CoV-2 viral test result  Has mild or moderate COVID-19   Is NOT hospitalized due to COVID-19  Is within 10 days of symptom onset  Has at least one of the high risk factor(s) for progression to severe COVID-19 and/or hospitalization as defined in EUA.  Specific high risk criteria : Older age (>/= 75 yo), Diabetes and Cardiovascular disease or hypertension   I have spoken and communicated the following to the patient or parent/caregiver regarding COVID monoclonal antibody treatment:  1. FDA has authorized the emergency use for the treatment of mild to moderate COVID-19 in adults and pediatric patients with positive results of direct SARS-CoV-2 viral testing who are 67 years of age and older weighing at least 40 kg, and who are at high risk for progressing to severe COVID-19 and/or hospitalization.  2. The significant known and potential risks and benefits of COVID monoclonal antibody, and the extent to which such potential risks and benefits are unknown.  3. Information on available alternative treatments and the risks and benefits of those alternatives, including clinical trials.  4. Patients treated with COVID monoclonal antibody should continue to self-isolate and use infection control measures (e.g., wear mask, isolate, social distance, avoid sharing personal items, clean and disinfect "high touch" surfaces, and frequent handwashing) according to CDC guidelines.   5. The patient or parent/caregiver has the option to accept or refuse COVID monoclonal antibody treatment.  After reviewing this information with  the patient, the patient has agreed to receive one of the available covid 19 monoclonal antibodies and will be provided an appropriate fact sheet prior to infusion. Rexene Alberts, NP 09/04/2020 12:59 PM

## 2020-09-05 ENCOUNTER — Ambulatory Visit (HOSPITAL_COMMUNITY): Payer: Medicare Other

## 2020-09-15 ENCOUNTER — Other Ambulatory Visit: Payer: Self-pay | Admitting: *Deleted

## 2020-09-15 ENCOUNTER — Encounter: Payer: Self-pay | Admitting: Acute Care

## 2020-09-15 DIAGNOSIS — Z87891 Personal history of nicotine dependence: Secondary | ICD-10-CM

## 2020-10-01 ENCOUNTER — Other Ambulatory Visit: Payer: Self-pay | Admitting: Cardiovascular Disease

## 2020-10-02 ENCOUNTER — Telehealth: Payer: Self-pay | Admitting: Acute Care

## 2020-10-02 NOTE — Telephone Encounter (Signed)
Correct phone number is [phone].

## 2020-10-05 NOTE — Telephone Encounter (Signed)
I have finally spoken with Derrick [name] and he is aware his LCS CT has been scheduled at [facility] [address] [place] on 10/16/2020 @ 112:20pm

## 2020-10-06 DIAGNOSIS — Z79899 Other long term (current) drug therapy: Secondary | ICD-10-CM | POA: Diagnosis not present

## 2020-10-06 DIAGNOSIS — G629 Polyneuropathy, unspecified: Secondary | ICD-10-CM | POA: Diagnosis not present

## 2020-10-06 DIAGNOSIS — G894 Chronic pain syndrome: Secondary | ICD-10-CM | POA: Diagnosis not present

## 2020-10-09 ENCOUNTER — Ambulatory Visit: Payer: Medicare Other

## 2020-10-12 DIAGNOSIS — E7800 Pure hypercholesterolemia, unspecified: Secondary | ICD-10-CM | POA: Diagnosis not present

## 2020-10-12 DIAGNOSIS — Z0000 Encounter for general adult medical examination without abnormal findings: Secondary | ICD-10-CM | POA: Diagnosis not present

## 2020-10-12 DIAGNOSIS — E1140 Type 2 diabetes mellitus with diabetic neuropathy, unspecified: Secondary | ICD-10-CM | POA: Diagnosis not present

## 2020-10-12 DIAGNOSIS — I10 Essential (primary) hypertension: Secondary | ICD-10-CM | POA: Diagnosis not present

## 2020-10-15 DIAGNOSIS — E1140 Type 2 diabetes mellitus with diabetic neuropathy, unspecified: Secondary | ICD-10-CM | POA: Diagnosis not present

## 2020-10-15 DIAGNOSIS — E7800 Pure hypercholesterolemia, unspecified: Secondary | ICD-10-CM | POA: Diagnosis not present

## 2020-10-15 DIAGNOSIS — Z0000 Encounter for general adult medical examination without abnormal findings: Secondary | ICD-10-CM | POA: Diagnosis not present

## 2020-10-15 DIAGNOSIS — I10 Essential (primary) hypertension: Secondary | ICD-10-CM | POA: Diagnosis not present

## 2020-10-16 ENCOUNTER — Ambulatory Visit
Admission: RE | Admit: 2020-10-16 | Discharge: 2020-10-16 | Disposition: A | Discharge status: Home / Self Care | Payer: Medicare Other | Source: Ambulatory Visit | Attending: Acute Care | Admitting: Acute Care

## 2020-10-16 DIAGNOSIS — J984 Other disorders of lung: Secondary | ICD-10-CM | POA: Diagnosis not present

## 2020-10-16 DIAGNOSIS — J8489 Other specified interstitial pulmonary diseases: Secondary | ICD-10-CM | POA: Diagnosis not present

## 2020-10-16 DIAGNOSIS — Z87891 Personal history of nicotine dependence: Secondary | ICD-10-CM | POA: Diagnosis not present

## 2020-10-16 DIAGNOSIS — J432 Centrilobular emphysema: Secondary | ICD-10-CM | POA: Diagnosis not present

## 2020-10-19 NOTE — Progress Notes (Signed)
Please call patient and let them  know their  low dose Ct was read as a Lung RADS 2: nodules that are benign in appearance and behavior with a very low likelihood of becoming a clinically active cancer due to size or lack of growth. Recommendation per radiology is for a repeat LDCT in 12 months. .Please let them  know we will order and schedule their  annual screening scan for 10/2021. Please let them  know there was notation of CAD on their  scan.  Please remind the patient  that this is a non-gated exam therefore degree or severity of disease  cannot be determined. Please have them  follow up with their PCP regarding potential risk factor modification, dietary therapy or pharmacologic therapy if clinically indicated. Pt.  is  currently on statin therapy. Please place order for annual  screening scan for  10/2021 and fax results to PCP. Thanks so much. 

## 2020-10-20 DIAGNOSIS — E1140 Type 2 diabetes mellitus with diabetic neuropathy, unspecified: Secondary | ICD-10-CM | POA: Diagnosis not present

## 2020-10-20 DIAGNOSIS — Z794 Long term (current) use of insulin: Secondary | ICD-10-CM | POA: Diagnosis not present

## 2020-10-20 DIAGNOSIS — E119 Type 2 diabetes mellitus without complications: Secondary | ICD-10-CM | POA: Diagnosis not present

## 2020-10-21 ENCOUNTER — Other Ambulatory Visit: Payer: Self-pay | Admitting: *Deleted

## 2020-10-21 DIAGNOSIS — Z87891 Personal history of nicotine dependence: Secondary | ICD-10-CM

## 2020-10-22 ENCOUNTER — Encounter (HOSPITAL_COMMUNITY): Payer: Medicare Other

## 2020-10-22 ENCOUNTER — Other Ambulatory Visit (HOSPITAL_COMMUNITY): Payer: Self-pay | Admitting: Cardiovascular Disease

## 2020-10-22 ENCOUNTER — Ambulatory Visit (HOSPITAL_BASED_OUTPATIENT_CLINIC_OR_DEPARTMENT_OTHER)
Admission: RE | Admit: 2020-10-22 | Discharge: 2020-10-22 | Disposition: A | Discharge status: Home / Self Care | Payer: Medicare Other | Source: Ambulatory Visit | Attending: Cardiovascular Disease | Admitting: Cardiovascular Disease

## 2020-10-22 ENCOUNTER — Ambulatory Visit (HOSPITAL_COMMUNITY)
Admission: RE | Admit: 2020-10-22 | Discharge: 2020-10-22 | Disposition: A | Discharge status: Home / Self Care | Payer: Medicare Other | Source: Ambulatory Visit | Attending: Cardiovascular Disease | Admitting: Cardiovascular Disease

## 2020-10-22 ENCOUNTER — Other Ambulatory Visit: Payer: Self-pay

## 2020-10-22 DIAGNOSIS — Z95828 Presence of other vascular implants and grafts: Secondary | ICD-10-CM

## 2020-10-22 DIAGNOSIS — I739 Peripheral vascular disease, unspecified: Secondary | ICD-10-CM

## 2020-10-23 DIAGNOSIS — E1140 Type 2 diabetes mellitus with diabetic neuropathy, unspecified: Secondary | ICD-10-CM | POA: Diagnosis not present

## 2020-10-23 DIAGNOSIS — Z794 Long term (current) use of insulin: Secondary | ICD-10-CM | POA: Diagnosis not present

## 2020-11-06 ENCOUNTER — Other Ambulatory Visit: Payer: Self-pay

## 2020-11-06 ENCOUNTER — Encounter: Payer: Self-pay | Admitting: Cardiovascular Disease

## 2020-11-06 ENCOUNTER — Ambulatory Visit: Payer: Medicare Other | Admitting: Cardiovascular Disease

## 2020-11-06 DIAGNOSIS — Z789 Other specified health status: Secondary | ICD-10-CM

## 2020-11-06 DIAGNOSIS — I739 Peripheral vascular disease, unspecified: Secondary | ICD-10-CM

## 2020-11-06 DIAGNOSIS — I152 Hypertension secondary to endocrine disorders: Secondary | ICD-10-CM

## 2020-11-06 DIAGNOSIS — I214 Non-ST elevation (NSTEMI) myocardial infarction: Secondary | ICD-10-CM | POA: Diagnosis not present

## 2020-11-06 DIAGNOSIS — I6521 Occlusion and stenosis of right carotid artery: Secondary | ICD-10-CM

## 2020-11-06 DIAGNOSIS — E1159 Type 2 diabetes mellitus with other circulatory complications: Secondary | ICD-10-CM

## 2020-11-06 NOTE — Assessment & Plan Note (Signed)
History of statin intolerance.  He is unable to afford Repatha.  His last lipid profile performed 10/12/2020 revealed total cholesterol 190, LDL 125 and HDL 45, not at goal for secondary prevention.

## 2020-11-06 NOTE — Patient Instructions (Addendum)
Medication Instructions:  Your physician recommends that you continue on your current medications as directed. Please refer to the Current Medication list given to you today.  *If you need a refill on your cardiac medications before your next appointment, please call your pharmacy*  Testing/Procedures: Your physician has requested that you have a carotid duplex. This test is an ultrasound of the carotid arteries in your neck. It looks at blood flow through these arteries that supply the brain with blood. Allow one hour for this exam. There are no restrictions or special instructions. To be done in July 2022  Your physician has requested that you have an abdominal aorta duplex. During this test, an ultrasound is used to evaluate the aorta. Allow 30 minutes for this exam. Do not eat after midnight the day before and avoid carbonated beverages  Your physician has requested that you have a lower extremity arterial duplex. This test is an ultrasound of the arteries in the legs. It looks at arterial blood flow in the legs and arms. Allow one hour for Lower Arterial scans. There are no restrictions or special instructions. To be done in November 2022.    Follow-Up: At [facility], you and your health needs are our priority.  As part of our continuing mission to provide you with exceptional heart care, we have created designated Provider Care Teams.  These Care Teams include your primary Cardiologist (physician) and Advanced Practice Providers (APPs -  Physician Assistants and Nurse Practitioners) who all work together to provide you with the care you need, when you need it.  We recommend signing up for the patient portal called "MyChart".  Sign up information is provided on this After Visit Summary.  MyChart is used to connect with patients for Virtual Visits (Telemedicine).  Patients are able to view lab/test results, encounter notes, upcoming appointments, etc.  Non-urgent messages can be sent to your  provider as well.   To learn more about what you can do with MyChart, go to [email or web address].    Your next appointment:  12 months   The format for your next appointment:   In Person  Provider:   Nanetta Batty, MD

## 2020-11-06 NOTE — Assessment & Plan Note (Signed)
History of essential hypertension a blood pressure measured today at 91/52.  He is on metoprolol.

## 2020-11-06 NOTE — Progress Notes (Signed)
11/06/2020 Derrick [name]   Nov 11, 1946  [id number]  Primary Physician Deland Pretty, MD Primary Cardiologist: Lorretta Harp MD FACP, [place], [place], [place]  HPI:  Derrick [name] is a 74 y.o.  thin and fit-appearing married Caucasian male father of 4, grandfather to 3 grandchildren who I last saw saw for a virtual telemedicine video visit 05/08/2019.  He was referred by Dr. Shelia Media for cardiovascular evaluation because of a prior history of coronary stenting. Factors include treated diabetes and hyperlipidemia intolerant to statin therapy. He smoked 45 pack years and quit at the time of his myocardial infarction in 2010. He does have a family history of heart disease with a father and brother both of whom had coronary artery bypass grafting. He'll myocardial infarction back in 2010 and had stenting performed by Dr. Olevia Perches. He does get occasional atypical chest pain every other month. There also is a question of moderate mitral regurgitation. A 2-D echocardiogram showed normal LV function with mild MR. The Myoview stress test which was read as intermediate risk with inferior and lateral ischemia. Based on this, the patient was referred for cardiac catheterization to define his anatomy. It should also be noted the patient has been having right hip pain thought to be arthritic for many years. I performed cardiac catheterization on him 03/28/16 revealing a patent proximal LAD stent, 95% ostial circumflex, 70% mid AV groove circumflex a long 70% proximal and mid dominant RCA stenosis. I thought his anatomy was most suited for coronary artery bypass grafting which was performed by Dr. Cyndia Bent on 04/22/16. The LIMA graft to his OM1, vein to OM 2 and a free RIMA to the RCA. His postoperative course was uncomplicated. He is back to work now denying chest pain or shortness of breath. His major issue now is bilateral hip and calf claudication which is lifestyle limiting. He presents today for angiography and potential  right common iliac artery PTA and stenting for chronic total occlusion. He had left common iliac artery stenting 08/22/16 successfully improving his left lower extremity . I attempted to percutaneously recanalized his right common iliac chronic total occlusion of 10/13/16 unsuccessfully. He continues to have lifestyle limiting right hip claudication. We talked about options and the patient desires to proceed with surgical revascularization. I'm referred him to Dr. Trula Slade for consideration of left to right femorofemoral crossover grafting which was performed on 12/30/16 successfully. He had a postop visit with Dr. Trula Slade one month later and was released back to my care after that. He says his claudication is somewhat improved.Recent Dopplers performed 03/09/2018 showed increased velocities within his left common iliac artery stent and of the proximal anastomosis of the graft although he continues to deny claudication.  Since I saw him a year ago he has remained stable.  He denies chest pain or shortness of breath.  Does complain of right hip and lower extremity discomfort which sounds like claudication.  Recent Dopplers suggest progression of disease within his left common iliac artery suggesting in-stent restenosis.  We discussed angiography and endovascular therapy however the patient wishes to defer this.   Current Meds  Medication Sig  . aspirin EC 81 MG EC tablet Take 1 tablet (81 mg total) by mouth daily.  . Blood Glucose Monitoring Suppl (ONE TOUCH ULTRA SYSTEM KIT) W/DEVICE KIT Patient must check sugar TID  . clopidogrel (PLAVIX) 75 MG tablet Take 1 tablet by mouth once daily  . glucose blood (ONE TOUCH ULTRA TEST) test strip USE ONE STRIP  TO CHECK GLUCOSE 4 TIMES DAILY AS NEEDED  . HYDROcodone-acetaminophen (NORCO/VICODIN) 5-325 MG tablet Take 1 tablet by mouth every 6 (six) hours as needed for moderate pain. (Patient taking differently: Take 1 tablet by mouth daily as needed for moderate pain. )   . Insulin Human (INSULIN PUMP) SOLN Inject 1 each into the skin continuous. Meal settings: 2.7 small, 5.5 medium, 8.5 large  Humalog  . lisinopril (PRINIVIL,ZESTRIL) 2.5 MG tablet Take 2.5 mg by mouth daily.  . metoprolol tartrate (LOPRESSOR) 25 MG tablet Take 0.5 tablets (12.5 mg total) by mouth 2 (two) times daily.  [name] USE ONE  4 TIMES DAILY AS NEEDED  . pantoprazole (PROTONIX) 40 MG tablet Take 1 tablet by mouth once daily  . research study medication Take 1 each by mouth daily. Study drug for cholesterol from Dr. Deland Pretty  . [DISCONTINUED] Evolocumab (REPATHA SURECLICK) 553 MG/ML SOAJ Inject 140 mg into the skin every 14 (fourteen) days.     Allergies  Allergen Reactions  . Atorvastatin Other (See Comments)    MYALGIAS MUSCLE CRAMPS   . Lovastatin Other (See Comments)    MYALGIAS MUSCLE CRAMPS  . Pravastatin Other (See Comments)    MYALGIAS MUSCLE CRAMPS  . Simvastatin Other (See Comments)    MYALGIAS MUSCLE CRAMPS    Social History   Socioeconomic History  . Marital status: Married    Spouse name: Not on file  . Number of children: Not on file  . Years of education: Not on file  . Highest education level: Not on file  Occupational History  . Not on file  Tobacco Use  . Smoking status: Former Smoker    Packs/day: 1.00    Years: 50.00    Pack years: 50.00    Types: Cigarettes    Quit date: 01/06/2009    Years since quitting: 11.8  . Smokeless tobacco: Never Used  Vaping Use  . Vaping Use: Former  Substance and Sexual Activity  . Alcohol use: No    Alcohol/week: 0.0 standard drinks    Comment: "drank some in high school"  . Drug use: No  . Sexual activity: Never  Other Topics Concern  . Not on file  Social History Narrative   Married, works part time at [employer] in [place], [place].   Former smoker: quit 01/2011.  No alcohol or drugs.   No formal exercise.   Social Determinants of Health   Financial  Resource Strain:   . Difficulty of Paying Living Expenses: Not on file  Food Insecurity:   . Worried About Charity fundraiser in the Last Year: Not on file  . Ran Out of Food in the Last Year: Not on file  Transportation Needs:   . Lack of Transportation (Medical): Not on file  . Lack of Transportation (Non-Medical): Not on file  Physical Activity:   . Days of Exercise per Week: Not on file  . Minutes of Exercise per Session: Not on file  Stress:   . Feeling of Stress : Not on file  Social Connections:   . Frequency of Communication with Friends and Family: Not on file  . Frequency of Social Gatherings with Friends and Family: Not on file  . Attends Religious Services: Not on file  . Active Member of Clubs or Organizations: Not on file  . Attends Archivist Meetings: Not on file  . Marital Status: Not on file  Intimate Partner Violence:   .  Fear of Current or Ex-Partner: Not on file  . Emotionally Abused: Not on file  . Physically Abused: Not on file  . Sexually Abused: Not on file     Review of Systems: General: negative for chills, fever, night sweats or weight changes.  Cardiovascular: negative for chest pain, dyspnea on exertion, edema, orthopnea, palpitations, paroxysmal nocturnal dyspnea or shortness of breath Dermatological: negative for rash Respiratory: negative for cough or wheezing Urologic: negative for hematuria Abdominal: negative for nausea, vomiting, diarrhea, bright red blood per rectum, melena, or hematemesis Neurologic: negative for visual changes, syncope, or dizziness All other systems reviewed and are otherwise negative except as noted above.    Blood pressure (!) 91/52, pulse 67, height 5' 6" (1.676 m), weight 136 lb 3.2 oz (61.8 kg), SpO2 98 %.  General appearance: alert and no distress Neck: no adenopathy, no carotid bruit, no JVD, supple, symmetrical, trachea midline and thyroid not enlarged, symmetric, no tenderness/mass/nodules Lungs:  clear to auscultation bilaterally Heart: regular rate and rhythm, S1, S2 normal, no murmur, click, rub or gallop Extremities: extremities normal, atraumatic, no cyanosis or edema Pulses: Markedly diminished right femoral pulse, 1+ left femoral pulse without bruit.  There is a bruit in his lower left abdomen probably related to his iliac stent. Skin: Skin color, texture, turgor normal. No rashes or lesions Neurologic: Alert and oriented X 3, normal strength and tone. Normal symmetric reflexes. Normal coordination and gait  EKG normal sinus rhythm at 67 with nonspecific ST and T wave changes.  Personally reviewed this EKG.  ASSESSMENT AND PLAN:   Statin intolerance History of statin intolerance.  He is unable to afford Repatha.  His last lipid profile performed 10/12/2020 revealed total cholesterol 190, LDL 125 and HDL 45, not at goal for secondary prevention.  Acute myocardial infarction, subendocardial infarction [facility]) History of CAD status post remote stenting by Dr. Maurene Capes in 2010.  He had CABG by Dr. Cyndia Bent 04/22/2016 with a LIMA to his OM1 branch, vein to OM 2, and RIMA to RCA.  He denies chest pain.  Hypertension associated with diabetes ([place]) History of essential hypertension a blood pressure measured today at 91/52.  He is on metoprolol.  PVD (peripheral vascular disease) ([place]) History of peripheral arterial disease status post left common iliac artery stenting by myself 08/22/2016 with failed attempt at right common iliac artery CTO recanalization.  Ultimately referred him to Dr. Trula Slade who performed left to right femoral-femoral bypass grafting 12/30/2016 resulting in improvement in his claudication.  He has had progressive increase in velocities in his left common iliac artery stent with decrease in his left ABI and increase in his left iliac velocities suggesting the possibility of in-stent restenosis.  He does complain of symptoms compatible claudication with pain in his right hip with  ambulation.  Right-sided carotid artery disease ([place]) History of moderate right internal carotid artery stenosis by duplex ultrasound 06/26/2020.  This will be repeated in 1 year      Lorretta Harp MD [facility], [facility] 11/06/2020 3:42 PM

## 2020-11-06 NOTE — Assessment & Plan Note (Signed)
History of peripheral arterial disease status post left common iliac artery stenting by myself 08/22/2016 with failed attempt at right common iliac artery CTO recanalization.  Ultimately referred him to Dr. Myra Gianotti who performed left to right femoral-femoral bypass grafting 12/30/2016 resulting in improvement in his claudication.  He has had progressive increase in velocities in his left common iliac artery stent with decrease in his left ABI and increase in his left iliac velocities suggesting the possibility of in-stent restenosis.  He does complain of symptoms compatible claudication with pain in his right hip with ambulation.

## 2020-11-06 NOTE — Assessment & Plan Note (Signed)
History of CAD status post remote stenting by Dr. Dickie La in 2010.  He had CABG by Dr. Laneta Simmers 04/22/2016 with a LIMA to his OM1 branch, vein to OM 2, and RIMA to RCA.  He denies chest pain.

## 2020-11-06 NOTE — Assessment & Plan Note (Signed)
History of moderate right internal carotid artery stenosis by duplex ultrasound 06/26/2020.  This will be repeated in 1 year

## 2020-11-22 DIAGNOSIS — Z794 Long term (current) use of insulin: Secondary | ICD-10-CM | POA: Diagnosis not present

## 2020-11-22 DIAGNOSIS — E1140 Type 2 diabetes mellitus with diabetic neuropathy, unspecified: Secondary | ICD-10-CM | POA: Diagnosis not present

## 2020-12-08 DIAGNOSIS — B351 Tinea unguium: Secondary | ICD-10-CM | POA: Diagnosis not present

## 2020-12-08 DIAGNOSIS — E1142 Type 2 diabetes mellitus with diabetic polyneuropathy: Secondary | ICD-10-CM | POA: Diagnosis not present

## 2020-12-08 DIAGNOSIS — M79676 Pain in unspecified toe(s): Secondary | ICD-10-CM | POA: Diagnosis not present

## 2020-12-08 DIAGNOSIS — L84 Corns and callosities: Secondary | ICD-10-CM | POA: Diagnosis not present

## 2020-12-16 DIAGNOSIS — G894 Chronic pain syndrome: Secondary | ICD-10-CM | POA: Diagnosis not present

## 2020-12-16 DIAGNOSIS — G629 Polyneuropathy, unspecified: Secondary | ICD-10-CM | POA: Diagnosis not present

## 2020-12-16 DIAGNOSIS — Z79899 Other long term (current) drug therapy: Secondary | ICD-10-CM | POA: Diagnosis not present

## 2020-12-23 DIAGNOSIS — E1140 Type 2 diabetes mellitus with diabetic neuropathy, unspecified: Secondary | ICD-10-CM | POA: Diagnosis not present

## 2020-12-23 DIAGNOSIS — Z794 Long term (current) use of insulin: Secondary | ICD-10-CM | POA: Diagnosis not present

## 2020-12-31 ENCOUNTER — Encounter: Payer: Self-pay | Admitting: Neurology

## 2020-12-31 ENCOUNTER — Ambulatory Visit: Payer: Medicare Other | Admitting: Neurology

## 2020-12-31 VITALS — BP 160/82 | HR 80 | Ht 66.0 in | Wt 139.0 lb

## 2020-12-31 DIAGNOSIS — G250 Essential tremor: Secondary | ICD-10-CM

## 2020-12-31 NOTE — Progress Notes (Signed)
Subjective:    Patient ID: Derrick [name] is a 75 y.o. male.  HPI     Star Age, MD, PhD [facility] [address], [address] [place], [place] [address]  Dear Dr. Shelia Media,  I saw your patient, Derrick [name], upon your kind request in my neurologic clinic today for initial consultation of his tremor.  The patient is accompanied by his wife today.  As you know, [name] is a 75 year old left-handed gentleman with an underlying complex medical history of hypertension, hyperlipidemia, coronary artery disease with history of MI, status post stent placement, diabetes, neuropathy, cataracts, reflux disease, peripheral artery disease, chronic renal insufficiency, COPD, and mitral regurgitation, who reports a several month history of bilateral hand tremors.  Tremor is bothersome when he tries to eat or hold something.  It started in both hands and gradually became worse.  Per wife, he has probably had a hand tremor close to a year, it is somewhat progressive.  She also reports that for as long as she has known him he has always had a subtle hand tremor but she has noticed it more in the past few months.  He is not sure if anybody in his family had a diagnosis of Parkinson's disease but his dad had a mouth and bilateral hand tremor.  He was not actually diagnosed with Parkinson's disease or on treatment for it as he recalls.  He has 2 brothers and 1 sister, neither 1 with tremors.  He has 3 children, oldest daughter is from his first marriage, she is 76 years old, he has 2 children from his current wife, son is 53 and daughter is 40, neither of his children have any tremor issues.  He is retired from [employer] work for nearly 30 years. I reviewed your office note from 10/15/2020.  He lives with his wife.  He denies any major issues with anxiety but his wife is worried that he has mood irritability and anxiety issues.  He denies any major problems with mood irritability.  He  had blood work through your office in November 2021 and I was able to review the results.  Latest A1c in November 2021 was 7.2.  He does not drink any alcohol, he drinks caffeine in the form of coffee, 2 cups/day and at least 2 glasses of soda per day, diet.  Of note, he is on hydrocodone 1 pill at night for his neuropathy, he goes to [facility] for pain management.  He denies any one-sided weakness or numbness or tingling or droopy face or slurring of speech.  His wife feels that his voice is sometimes hoarse and mouth is dry.  He admits that he does not drink a whole lot of water.  He estimates that he drinks maybe a glass of water per day on average.   His Past Medical History Is Significant For: Past Medical History:  Diagnosis Date  . Anginal pain ([place])    occ   . Chronic renal insufficiency, stage III (moderate) ([place]) 2013   CrCl est high 50s (borderline stage II/III pt denies 12/27/16  . COPD (chronic obstructive pulmonary disease) ([place])   . Coronary atherosclerosis of native coronary vessel    S/P NSTEMI 04/11/2009--DEL stent to LAD.  Nuclear stress test 11/2010 NEG, EF normal  . Diabetic peripheral neuropathy ([facility])    Painful; 1 vicodin bid very helpful (pt resistant to alternative tx's)  . Early cataracts, bilateral 04/24/2012   [name] in [place],  [place]  . GERD (gastroesophageal reflux disease)   . Heart murmur    patient denies  . Herpes zoster    Two separate outbreaks--one on left axillary/back region, one on right axillary/back region  . Hyperlipidemia   . Hypertension   . Mitral regurgitation 08/2013   Moderate (echo)  . Myocardial infarction ([place]) 2010  . Peripheral arterial disease ([place])   . S/P arterial stent, 08/22/16 Lt CIA PTA/Stent  08/23/2016  . Type II diabetes mellitus ([place])     His Past Surgical History Is Significant For: Past Surgical History:  Procedure Laterality Date  . ANTERIOR CERVICAL DECOMP/DISCECTOMY FUSION  2004 X 2   "1st one collapsed; had  to go in 1 month later & do the front and back"  . BACK SURGERY    . CARDIAC CATHETERIZATION N/A 03/28/2016   Procedure: Left Heart Cath and Coronary Angiography;  Surgeon: Lorretta Harp, MD;  Location: [facility];  Service: Cardiovascular;  Laterality: N/A;  . CARDIOVASCULAR STRESS TEST  11/2010   No ischemia/normal EF  . CARPAL TUNNEL RELEASE Left 05/28/2015   Procedure: LEFT HAND CARPAL TUNNEL RELEASE;  Surgeon: Iran Planas, MD;  Location: [facility];  Service: Orthopedics;  Laterality: Left;  . COLONOSCOPY  12/2010   Normal screening colonoscopy; rpt 10 yrs  . CORONARY ANGIOPLASTY WITH STENT PLACEMENT  04/2009   DES to LAD  . CORONARY ARTERY BYPASS GRAFT N/A 04/22/2016   Procedure: CORONARY ARTERY BYPASS GRAFTING (CABG) LIMA to OM1 SVG to OM2 FREE RIMA to RCA  ENDOSCOPIC HARVEST GREATER SAPHENOUS VEIN -Right Thigh;  Surgeon: Gaye Pollack, MD;  Location: [facility];  Service: Open Heart Surgery;  Laterality: N/A;  . cryotherapy to AK lesion on nose  01/2010  . eye exam,diabetic  02/2011   Normal diabetic retinopathy screening exam at [facility] in [place], [place].  [name] FEMORAL-FEMORAL BYPASS GRAFT Bilateral 12/30/2016   Procedure: BYPASS GRAFT LEFT FEMORAL-RIGHT FEMORAL ARTERY;  Surgeon: Serafina Mitchell, MD;  Location: [facility];  Service: Vascular;  Laterality: Bilateral;  . ILIAC VEIN ANGIOPLASTY / STENTING  08/22/2016   Abdominal aortogram, bilateral iliac angiogram, bifemoral runoff  . LOWER EXTREMITY ANGIOGRAM  10/13/2016  . LUMBAR FUSION  2002   "put screws in"  . PERIPHERAL VASCULAR CATHETERIZATION N/A 03/28/2016   Procedure: Abdominal Aortogram;  Surgeon: Lorretta Harp, MD;  Location: [facility];  Service: Cardiovascular;  Laterality: N/A;  . PERIPHERAL VASCULAR CATHETERIZATION Bilateral 08/22/2016   Procedure: Lower Extremity Angiography;  Surgeon: Lorretta Harp, MD;  Location: [facility];  Service: Cardiovascular;  Laterality: Bilateral;  .  PERIPHERAL VASCULAR CATHETERIZATION Left 08/22/2016   Procedure: Peripheral Vascular Intervention;  Surgeon: Lorretta Harp, MD;  Location: [facility];  Service: Cardiovascular;  Laterality: Left;  COMMON ILIAC  . PERIPHERAL VASCULAR CATHETERIZATION Bilateral 10/13/2016   Procedure: Lower Extremity Angiography;  Surgeon: Lorretta Harp, MD;  Location: [facility];  Service: Cardiovascular;  Laterality: Bilateral;  . PERIPHERAL VASCULAR CATHETERIZATION Right 10/13/2016   Procedure: Peripheral Vascular Balloon Angioplasty;  Surgeon: Lorretta Harp, MD;  Location: [facility];  Service: Cardiovascular;  Laterality: Right;  Right Common ILIac  . POSTERIOR FUSION CERVICAL SPINE  2004  . TEE WITHOUT CARDIOVERSION N/A 04/22/2016   Procedure: TRANSESOPHAGEAL ECHOCARDIOGRAM (TEE);  Surgeon: Gaye Pollack, MD;  Location: [facility];  Service: Open Heart Surgery;  Laterality: N/A;  . TRANSTHORACIC ECHOCARDIOGRAM  08/2013  LVH, EF 55-65%, no WMA.  Moderate mitral regurg    His Family History Is Significant For: Family History  Problem Relation Age of Onset  . Cancer Mother        brain  . Diabetes Father   . Heart disease Father   . Diabetes Brother   . Heart disease Brother   . Diabetes Son   . Hyperlipidemia Son   . Coronary artery disease Other        family hx of    His Social History Is Significant For: Social History   Socioeconomic History  . Marital status: Married    Spouse name: Not on file  . Number of children: Not on file  . Years of education: Not on file  . Highest education level: Not on file  Occupational History  . Not on file  Tobacco Use  . Smoking status: Former Smoker    Packs/day: 1.00    Years: 50.00    Pack years: 50.00    Types: Cigarettes    Quit date: 01/06/2009    Years since quitting: 11.9  . Smokeless tobacco: Never Used  Vaping Use  . Vaping Use: Former  Substance and Sexual Activity  . Alcohol use: No    Alcohol/week: 0.0  standard drinks    Comment: "drank some in high school"  . Drug use: No  . Sexual activity: Never  Other Topics Concern  . Not on file  Social History Narrative   Married, works part time at [employer] in [place], [place].   Former smoker: quit 01/2011.  No alcohol or drugs.   No formal exercise.   Social Determinants of Health   Financial Resource Strain: Not on file  Food Insecurity: Not on file  Transportation Needs: Not on file  Physical Activity: Not on file  Stress: Not on file  Social Connections: Not on file    His Allergies Are:  Allergies  Allergen Reactions  . Atorvastatin Other (See Comments)    MYALGIAS MUSCLE CRAMPS   . Lovastatin Other (See Comments)    MYALGIAS MUSCLE CRAMPS  . Pravastatin Other (See Comments)    MYALGIAS MUSCLE CRAMPS  . Simvastatin Other (See Comments)    MYALGIAS MUSCLE CRAMPS  :   His Current Medications Are:  Outpatient Encounter Medications as of 12/31/2020  Medication Sig  . aspirin EC 81 MG EC tablet Take 1 tablet (81 mg total) by mouth daily.  . Blood Glucose Monitoring Suppl (ONE TOUCH ULTRA SYSTEM KIT) W/DEVICE KIT Patient must check sugar TID  . clopidogrel (PLAVIX) 75 MG tablet Take 1 tablet by mouth once daily  . ezetimibe (ZETIA) 10 MG tablet Take 1 tablet by mouth daily.  [name] glucose blood (ONE TOUCH ULTRA TEST) test strip USE ONE STRIP TO CHECK GLUCOSE 4 TIMES DAILY AS NEEDED  . HYDROcodone-acetaminophen (NORCO/VICODIN) 5-325 MG tablet Take 1 tablet by mouth every 6 (six) hours as needed for moderate pain. (Patient taking differently: Take 1 tablet by mouth daily as needed for moderate pain.)  . insulin lispro (HUMALOG) 100 UNIT/ML injection up to 100 units via insulin pump  . lisinopril (PRINIVIL,ZESTRIL) 2.5 MG tablet Take 2.5 mg by mouth daily.  . metoprolol tartrate (LOPRESSOR) 25 MG tablet Take 0.5 tablets (12.5 mg total) by mouth 2 (two) times daily.  Glory Rosebush DELICA LANCETS 36R MISC USE ONE  4 TIMES  DAILY AS NEEDED  . pantoprazole (PROTONIX) 40 MG tablet Take 1 tablet by mouth once daily  .  research study medication Take 1 each by mouth daily. Study drug for cholesterol from Dr. Deland Pretty  . rosuvastatin (CRESTOR) 5 MG tablet Take 5 mg by mouth once a week.  . [DISCONTINUED] Insulin Human (INSULIN PUMP) SOLN Inject 1 each into the skin continuous. Meal settings: 2.7 small, 5.5 medium, 8.5 large  Humalog   No facility-administered encounter medications on file as of 12/31/2020.  :   Review of Systems:  Out of a complete 14 point review of systems, all are reviewed and negative with the exception of these symptoms as listed below:  Review of Systems  Neurological:       Pt presents today noticing over the last 3-4 months bilateral hand tremors. He states occurs all the time but more prominent when he is using his hands or holding arm out. He is Left handed.     Objective:  Neurological Exam  Physical Exam Physical Examination:   Vitals:   12/31/20 0936  BP: (!) 160/82  Pulse: 80    General Examination: The patient is a very pleasant 75 y.o. male in no acute distress. He appears well-developed and well-nourished and well groomed.   HEENT: Normocephalic, atraumatic, pupils are equal, round and reactive to light, extraocular tracking is well preserved.  He has corrective eyeglasses in place, hearing is grossly intact.  Voice is slightly hoarse at times, not particularly hypophonic, no significant dysarthria noted.  He has an intermittent lower lip and jaw tremor.  Airway examination reveals severe mouth dryness, loose dentures, tongue protrudes centrally and palate elevates symmetrically.   Chest: Clear to auscultation without wheezing, rhonchi or crackles noted.  Heart: S1+S2+0, regular and normal without murmurs, rubs or gallops noted.   Abdomen: Soft, non-tender and non-distended with normal bowel sounds appreciated on auscultation.  Extremities: There is no pitting  edema in the distal lower extremities bilaterally.   Skin: Warm and dry without trophic changes noted.  Musculoskeletal: exam reveals arthritic changes in both hands.    Neurologically:  Mental status: The patient is awake, alert and oriented in all 4 spheres. His immediate and remote memory, attention, language skills and fund of knowledge are appropriate. There is no evidence of aphasia, agnosia, apraxia or anomia. Speech is clear with normal prosody and enunciation. Thought process is linear. Mood is normal and affect is normal.  Cranial nerves II - XII are as described above under HEENT exam. In addition: shoulder shrug is normal with equal shoulder height noted. Motor exam: Normal bulk, strength and tone is noted. There is no drift, resting tremor or rebound. Romberg is negative.  On 12/31/2020: On Archimedes spiral drawing he has mild insecurity with the right hand, slight trembling with the left and right hand, handwriting with the left hand is tremulous, legible, on the smaller side.  He has a minimal bilateral upper extremity postural tremor, no resting tremor, mild action tremor in both upper extremities, no intention tremor.  He has no lower extremity tremor. Reflexes are 1+ in the upper extremities and absent in the lower extremities.  Fine motor skills and coordination: intact with normal finger taps, normal hand movements, normal rapid alternating patting, normal foot taps and normal foot agility.  Cerebellar testing: No dysmetria or intention tremor on finger to nose testing. Heel to shin is unremarkable bilaterally. There is no truncal or gait ataxia.  Sensory exam: intact to light touch in the upper and lower extremities.  Gait, station and balance: He stands without difficulty, posture is age-appropriate.  He  walks slightly slowly, no shuffling noted, he has preserved arm swing.  He turns slowly.  Tandem walk is challenging for him.              Assessment and Plan:   In summary,  LOYAL HOLZHEIMER is a very pleasant 75 y.o.-year old male  with an underlying complex medical history of hypertension, hyperlipidemia, coronary artery disease with history of MI, status post stent placement, diabetes, neuropathy, cataracts, reflux disease, peripheral artery disease, chronic renal insufficiency, COPD, and mitral regurgitation, who presents for evaluation of his tremor disorder.  He reports a several month history of hand tremors.  He may have had tremors for much longer than this but noticed exacerbation in the past few months.  He reports a family history of tremor affecting his dad, he is not not completely sure if he was actually diagnosed with Parkinson's disease.  His history, family history and examination are in keeping with essential tremor.  He does not really have any telltale signs of parkinsonism.  He is reassured in that regard.  Findings are overall still in the mild range.  I would not favor any symptomatic treatment for his tremor at this time especially since he is on several other medications.  Given his history of COPD and diabetes I would like to avoid a beta-blocker.  We can consider Mysoline with caution in the future if the need arises.  For now, I would like to continue to monitor his examination and have him follow-up in about 1 year for recheck.  We talked about tremor triggers.  His wife is concerned that he has had some mood irritability and also anxiety.  He denies any significant issues with his mood at this time but is encouraged to talk to you if he has any mood related symptoms.  He had blood work in the recent past.  He is advised to stay better hydrated with water and try to reduce his caffeine intake as caffeine can be a trigger for anxiety, nervousness and also exacerbate his tremor temporarily.  He is advised to follow-up routinely in 1 year and call us in the interim with any questions or concerns.  I answered all the questions today and the patient and his wife  are in agreement.   Thank you very much for allowing me to participate in the care of this nice patient. If I can be of any further assistance to you please do not hesitate to call me at [phone].  Sincerely,   Star Age, MD, PhD

## 2020-12-31 NOTE — Patient Instructions (Addendum)
It was nice to meet you both today.   You have a mild tremor of both hands.  I do not see any signs or symptoms of parkinson's like disease or what we call parkinsonism.  You likely have what we call essential tremor or a hereditary type of hand tremor.  For your tremor, I would not recommend any new medication for fear of side effects (especially sleepiness) or medication interactions, especially in light of your existing medications.  I would like to continue clinical observation.  We can recheck in about a year.  You can always call if you feel you need a sooner appointment.  Please remember, that any kind of tremor may be exacerbated by anxiety, anger, nervousness, excitement, dehydration, sleep deprivation, by caffeine, and low blood sugar values or blood sugar fluctuations. Some medications can exacerbate tremors.   If you feel that you do have mood irritability or anxiety, please talk to Dr. Renne Crigler about management strategies or potential medications.  Please try to hydrate better with water, try to drink about 6 cups of water per day if possible.  Try to reduce your caffeine intake and avoid drinking soda, limit your caffeine to about 2 servings per day and try to gradually reduce your diet soda intake.

## 2021-01-05 ENCOUNTER — Encounter: Payer: Self-pay | Admitting: Gastroenterology

## 2021-01-06 ENCOUNTER — Other Ambulatory Visit: Payer: Self-pay | Admitting: Cardiovascular Disease

## 2021-01-13 ENCOUNTER — Other Ambulatory Visit: Payer: Self-pay | Admitting: Cardiovascular Disease

## 2021-01-19 DIAGNOSIS — E1140 Type 2 diabetes mellitus with diabetic neuropathy, unspecified: Secondary | ICD-10-CM | POA: Diagnosis not present

## 2021-01-19 DIAGNOSIS — Z794 Long term (current) use of insulin: Secondary | ICD-10-CM | POA: Diagnosis not present

## 2021-01-19 DIAGNOSIS — E119 Type 2 diabetes mellitus without complications: Secondary | ICD-10-CM | POA: Diagnosis not present

## 2021-02-01 DIAGNOSIS — E1140 Type 2 diabetes mellitus with diabetic neuropathy, unspecified: Secondary | ICD-10-CM | POA: Diagnosis not present

## 2021-02-01 DIAGNOSIS — E7800 Pure hypercholesterolemia, unspecified: Secondary | ICD-10-CM | POA: Diagnosis not present

## 2021-02-03 DIAGNOSIS — E7800 Pure hypercholesterolemia, unspecified: Secondary | ICD-10-CM | POA: Diagnosis not present

## 2021-02-03 DIAGNOSIS — E1140 Type 2 diabetes mellitus with diabetic neuropathy, unspecified: Secondary | ICD-10-CM | POA: Diagnosis not present

## 2021-02-03 DIAGNOSIS — Z794 Long term (current) use of insulin: Secondary | ICD-10-CM | POA: Diagnosis not present

## 2021-02-03 DIAGNOSIS — I10 Essential (primary) hypertension: Secondary | ICD-10-CM | POA: Diagnosis not present

## 2021-02-12 DIAGNOSIS — G629 Polyneuropathy, unspecified: Secondary | ICD-10-CM | POA: Diagnosis not present

## 2021-02-12 DIAGNOSIS — E119 Type 2 diabetes mellitus without complications: Secondary | ICD-10-CM | POA: Diagnosis not present

## 2021-02-12 DIAGNOSIS — G894 Chronic pain syndrome: Secondary | ICD-10-CM | POA: Diagnosis not present

## 2021-02-12 DIAGNOSIS — Z79899 Other long term (current) drug therapy: Secondary | ICD-10-CM | POA: Diagnosis not present

## 2021-02-17 ENCOUNTER — Other Ambulatory Visit: Payer: Self-pay | Admitting: Cardiovascular Disease

## 2021-02-19 DIAGNOSIS — Z794 Long term (current) use of insulin: Secondary | ICD-10-CM | POA: Diagnosis not present

## 2021-02-19 DIAGNOSIS — E1140 Type 2 diabetes mellitus with diabetic neuropathy, unspecified: Secondary | ICD-10-CM | POA: Diagnosis not present

## 2021-03-18 DIAGNOSIS — Z794 Long term (current) use of insulin: Secondary | ICD-10-CM | POA: Diagnosis not present

## 2021-03-18 DIAGNOSIS — E7800 Pure hypercholesterolemia, unspecified: Secondary | ICD-10-CM | POA: Diagnosis not present

## 2021-03-18 DIAGNOSIS — E1140 Type 2 diabetes mellitus with diabetic neuropathy, unspecified: Secondary | ICD-10-CM | POA: Diagnosis not present

## 2021-03-18 DIAGNOSIS — I10 Essential (primary) hypertension: Secondary | ICD-10-CM | POA: Diagnosis not present

## 2021-03-20 ENCOUNTER — Other Ambulatory Visit: Payer: Self-pay | Admitting: Cardiovascular Disease

## 2021-03-22 DIAGNOSIS — E1140 Type 2 diabetes mellitus with diabetic neuropathy, unspecified: Secondary | ICD-10-CM | POA: Diagnosis not present

## 2021-03-22 DIAGNOSIS — Z794 Long term (current) use of insulin: Secondary | ICD-10-CM | POA: Diagnosis not present

## 2021-04-06 DIAGNOSIS — L84 Corns and callosities: Secondary | ICD-10-CM | POA: Diagnosis not present

## 2021-04-06 DIAGNOSIS — B351 Tinea unguium: Secondary | ICD-10-CM | POA: Diagnosis not present

## 2021-04-06 DIAGNOSIS — M79676 Pain in unspecified toe(s): Secondary | ICD-10-CM | POA: Diagnosis not present

## 2021-04-06 DIAGNOSIS — E1142 Type 2 diabetes mellitus with diabetic polyneuropathy: Secondary | ICD-10-CM | POA: Diagnosis not present

## 2021-04-12 ENCOUNTER — Other Ambulatory Visit: Payer: Self-pay | Admitting: Cardiovascular Disease

## 2021-04-12 NOTE — Telephone Encounter (Signed)
Rx(s) sent to pharmacy electronically.  

## 2021-04-15 DIAGNOSIS — G894 Chronic pain syndrome: Secondary | ICD-10-CM | POA: Diagnosis not present

## 2021-04-15 DIAGNOSIS — E1165 Type 2 diabetes mellitus with hyperglycemia: Secondary | ICD-10-CM | POA: Diagnosis not present

## 2021-04-15 DIAGNOSIS — G629 Polyneuropathy, unspecified: Secondary | ICD-10-CM | POA: Diagnosis not present

## 2021-04-15 DIAGNOSIS — Z79899 Other long term (current) drug therapy: Secondary | ICD-10-CM | POA: Diagnosis not present

## 2021-04-19 DIAGNOSIS — E1140 Type 2 diabetes mellitus with diabetic neuropathy, unspecified: Secondary | ICD-10-CM | POA: Diagnosis not present

## 2021-04-19 DIAGNOSIS — E119 Type 2 diabetes mellitus without complications: Secondary | ICD-10-CM | POA: Diagnosis not present

## 2021-04-19 DIAGNOSIS — Z794 Long term (current) use of insulin: Secondary | ICD-10-CM | POA: Diagnosis not present

## 2021-04-21 DIAGNOSIS — Z794 Long term (current) use of insulin: Secondary | ICD-10-CM | POA: Diagnosis not present

## 2021-04-21 DIAGNOSIS — E1140 Type 2 diabetes mellitus with diabetic neuropathy, unspecified: Secondary | ICD-10-CM | POA: Diagnosis not present

## 2021-04-27 DIAGNOSIS — E1140 Type 2 diabetes mellitus with diabetic neuropathy, unspecified: Secondary | ICD-10-CM | POA: Diagnosis not present

## 2021-04-27 DIAGNOSIS — E7800 Pure hypercholesterolemia, unspecified: Secondary | ICD-10-CM | POA: Diagnosis not present

## 2021-04-29 DIAGNOSIS — I10 Essential (primary) hypertension: Secondary | ICD-10-CM | POA: Diagnosis not present

## 2021-04-29 DIAGNOSIS — E1140 Type 2 diabetes mellitus with diabetic neuropathy, unspecified: Secondary | ICD-10-CM | POA: Diagnosis not present

## 2021-04-29 DIAGNOSIS — E875 Hyperkalemia: Secondary | ICD-10-CM | POA: Diagnosis not present

## 2021-04-29 DIAGNOSIS — Z794 Long term (current) use of insulin: Secondary | ICD-10-CM | POA: Diagnosis not present

## 2021-04-29 DIAGNOSIS — E7800 Pure hypercholesterolemia, unspecified: Secondary | ICD-10-CM | POA: Diagnosis not present

## 2021-05-18 DIAGNOSIS — G629 Polyneuropathy, unspecified: Secondary | ICD-10-CM | POA: Diagnosis not present

## 2021-05-18 DIAGNOSIS — G894 Chronic pain syndrome: Secondary | ICD-10-CM | POA: Diagnosis not present

## 2021-05-18 DIAGNOSIS — Z1211 Encounter for screening for malignant neoplasm of colon: Secondary | ICD-10-CM | POA: Diagnosis not present

## 2021-05-18 DIAGNOSIS — E1165 Type 2 diabetes mellitus with hyperglycemia: Secondary | ICD-10-CM | POA: Diagnosis not present

## 2021-05-18 DIAGNOSIS — Z79899 Other long term (current) drug therapy: Secondary | ICD-10-CM | POA: Diagnosis not present

## 2021-05-25 DIAGNOSIS — I10 Essential (primary) hypertension: Secondary | ICD-10-CM | POA: Diagnosis not present

## 2021-05-25 DIAGNOSIS — E1140 Type 2 diabetes mellitus with diabetic neuropathy, unspecified: Secondary | ICD-10-CM | POA: Diagnosis not present

## 2021-05-25 DIAGNOSIS — E875 Hyperkalemia: Secondary | ICD-10-CM | POA: Diagnosis not present

## 2021-05-25 DIAGNOSIS — Z794 Long term (current) use of insulin: Secondary | ICD-10-CM | POA: Diagnosis not present

## 2021-05-25 DIAGNOSIS — E7800 Pure hypercholesterolemia, unspecified: Secondary | ICD-10-CM | POA: Diagnosis not present

## 2021-06-17 DIAGNOSIS — E1165 Type 2 diabetes mellitus with hyperglycemia: Secondary | ICD-10-CM | POA: Diagnosis not present

## 2021-06-17 DIAGNOSIS — G629 Polyneuropathy, unspecified: Secondary | ICD-10-CM | POA: Diagnosis not present

## 2021-06-17 DIAGNOSIS — G894 Chronic pain syndrome: Secondary | ICD-10-CM | POA: Diagnosis not present

## 2021-06-17 DIAGNOSIS — Z79899 Other long term (current) drug therapy: Secondary | ICD-10-CM | POA: Diagnosis not present

## 2021-06-22 DIAGNOSIS — E1140 Type 2 diabetes mellitus with diabetic neuropathy, unspecified: Secondary | ICD-10-CM | POA: Diagnosis not present

## 2021-06-22 DIAGNOSIS — Z794 Long term (current) use of insulin: Secondary | ICD-10-CM | POA: Diagnosis not present

## 2021-06-28 ENCOUNTER — Ambulatory Visit (HOSPITAL_COMMUNITY)
Admission: RE | Admit: 2021-06-28 | Payer: Medicare Other | Source: Ambulatory Visit | Attending: Cardiovascular Disease | Admitting: Cardiovascular Disease

## 2021-06-29 ENCOUNTER — Encounter (HOSPITAL_COMMUNITY): Payer: Self-pay

## 2021-07-09 DIAGNOSIS — J0110 Acute frontal sinusitis, unspecified: Secondary | ICD-10-CM | POA: Diagnosis not present

## 2021-07-09 DIAGNOSIS — Z20822 Contact with and (suspected) exposure to covid-19: Secondary | ICD-10-CM | POA: Diagnosis not present

## 2021-07-21 DIAGNOSIS — E1140 Type 2 diabetes mellitus with diabetic neuropathy, unspecified: Secondary | ICD-10-CM | POA: Diagnosis not present

## 2021-07-21 DIAGNOSIS — Z794 Long term (current) use of insulin: Secondary | ICD-10-CM | POA: Diagnosis not present

## 2021-07-21 DIAGNOSIS — E119 Type 2 diabetes mellitus without complications: Secondary | ICD-10-CM | POA: Diagnosis not present

## 2021-07-23 DIAGNOSIS — E1140 Type 2 diabetes mellitus with diabetic neuropathy, unspecified: Secondary | ICD-10-CM | POA: Diagnosis not present

## 2021-07-23 DIAGNOSIS — Z794 Long term (current) use of insulin: Secondary | ICD-10-CM | POA: Diagnosis not present

## 2021-07-27 DIAGNOSIS — I10 Essential (primary) hypertension: Secondary | ICD-10-CM | POA: Diagnosis not present

## 2021-07-29 DIAGNOSIS — Z955 Presence of coronary angioplasty implant and graft: Secondary | ICD-10-CM | POA: Diagnosis not present

## 2021-07-29 DIAGNOSIS — Z794 Long term (current) use of insulin: Secondary | ICD-10-CM | POA: Diagnosis not present

## 2021-07-29 DIAGNOSIS — E875 Hyperkalemia: Secondary | ICD-10-CM | POA: Diagnosis not present

## 2021-07-29 DIAGNOSIS — E1140 Type 2 diabetes mellitus with diabetic neuropathy, unspecified: Secondary | ICD-10-CM | POA: Diagnosis not present

## 2021-07-29 DIAGNOSIS — E7800 Pure hypercholesterolemia, unspecified: Secondary | ICD-10-CM | POA: Diagnosis not present

## 2021-07-29 DIAGNOSIS — I10 Essential (primary) hypertension: Secondary | ICD-10-CM | POA: Diagnosis not present

## 2021-08-10 DIAGNOSIS — L84 Corns and callosities: Secondary | ICD-10-CM | POA: Diagnosis not present

## 2021-08-10 DIAGNOSIS — M79676 Pain in unspecified toe(s): Secondary | ICD-10-CM | POA: Diagnosis not present

## 2021-08-10 DIAGNOSIS — E1142 Type 2 diabetes mellitus with diabetic polyneuropathy: Secondary | ICD-10-CM | POA: Diagnosis not present

## 2021-08-10 DIAGNOSIS — B351 Tinea unguium: Secondary | ICD-10-CM | POA: Diagnosis not present

## 2021-08-18 DIAGNOSIS — G629 Polyneuropathy, unspecified: Secondary | ICD-10-CM | POA: Diagnosis not present

## 2021-08-18 DIAGNOSIS — Z79899 Other long term (current) drug therapy: Secondary | ICD-10-CM | POA: Diagnosis not present

## 2021-08-18 DIAGNOSIS — E119 Type 2 diabetes mellitus without complications: Secondary | ICD-10-CM | POA: Diagnosis not present

## 2021-08-18 DIAGNOSIS — G894 Chronic pain syndrome: Secondary | ICD-10-CM | POA: Diagnosis not present

## 2021-08-23 DIAGNOSIS — E1140 Type 2 diabetes mellitus with diabetic neuropathy, unspecified: Secondary | ICD-10-CM | POA: Diagnosis not present

## 2021-08-23 DIAGNOSIS — Z794 Long term (current) use of insulin: Secondary | ICD-10-CM | POA: Diagnosis not present

## 2021-09-22 DIAGNOSIS — E1140 Type 2 diabetes mellitus with diabetic neuropathy, unspecified: Secondary | ICD-10-CM | POA: Diagnosis not present

## 2021-09-22 DIAGNOSIS — Z794 Long term (current) use of insulin: Secondary | ICD-10-CM | POA: Diagnosis not present

## 2021-09-30 DIAGNOSIS — E1165 Type 2 diabetes mellitus with hyperglycemia: Secondary | ICD-10-CM | POA: Diagnosis not present

## 2021-09-30 DIAGNOSIS — Z1159 Encounter for screening for other viral diseases: Secondary | ICD-10-CM | POA: Diagnosis not present

## 2021-09-30 DIAGNOSIS — E559 Vitamin D deficiency, unspecified: Secondary | ICD-10-CM | POA: Diagnosis not present

## 2021-09-30 DIAGNOSIS — R030 Elevated blood-pressure reading, without diagnosis of hypertension: Secondary | ICD-10-CM | POA: Diagnosis not present

## 2021-09-30 DIAGNOSIS — Z0000 Encounter for general adult medical examination without abnormal findings: Secondary | ICD-10-CM | POA: Diagnosis not present

## 2021-09-30 DIAGNOSIS — Z79899 Other long term (current) drug therapy: Secondary | ICD-10-CM | POA: Diagnosis not present

## 2021-10-07 ENCOUNTER — Other Ambulatory Visit: Payer: Self-pay | Admitting: Cardiovascular Disease

## 2021-10-18 DIAGNOSIS — E119 Type 2 diabetes mellitus without complications: Secondary | ICD-10-CM | POA: Diagnosis not present

## 2021-10-18 DIAGNOSIS — G629 Polyneuropathy, unspecified: Secondary | ICD-10-CM | POA: Diagnosis not present

## 2021-10-18 DIAGNOSIS — G894 Chronic pain syndrome: Secondary | ICD-10-CM | POA: Diagnosis not present

## 2021-10-18 DIAGNOSIS — Z79899 Other long term (current) drug therapy: Secondary | ICD-10-CM | POA: Diagnosis not present

## 2021-10-20 DIAGNOSIS — Z955 Presence of coronary angioplasty implant and graft: Secondary | ICD-10-CM | POA: Diagnosis not present

## 2021-10-20 DIAGNOSIS — Z794 Long term (current) use of insulin: Secondary | ICD-10-CM | POA: Diagnosis not present

## 2021-10-20 DIAGNOSIS — I10 Essential (primary) hypertension: Secondary | ICD-10-CM | POA: Diagnosis not present

## 2021-10-20 DIAGNOSIS — J432 Centrilobular emphysema: Secondary | ICD-10-CM | POA: Diagnosis not present

## 2021-10-20 DIAGNOSIS — E1140 Type 2 diabetes mellitus with diabetic neuropathy, unspecified: Secondary | ICD-10-CM | POA: Diagnosis not present

## 2021-10-20 DIAGNOSIS — Z0000 Encounter for general adult medical examination without abnormal findings: Secondary | ICD-10-CM | POA: Diagnosis not present

## 2021-10-20 DIAGNOSIS — I2510 Atherosclerotic heart disease of native coronary artery without angina pectoris: Secondary | ICD-10-CM | POA: Diagnosis not present

## 2021-10-20 DIAGNOSIS — E7800 Pure hypercholesterolemia, unspecified: Secondary | ICD-10-CM | POA: Diagnosis not present

## 2021-10-20 DIAGNOSIS — E119 Type 2 diabetes mellitus without complications: Secondary | ICD-10-CM | POA: Diagnosis not present

## 2021-10-23 DIAGNOSIS — Z794 Long term (current) use of insulin: Secondary | ICD-10-CM | POA: Diagnosis not present

## 2021-10-23 DIAGNOSIS — E1140 Type 2 diabetes mellitus with diabetic neuropathy, unspecified: Secondary | ICD-10-CM | POA: Diagnosis not present

## 2021-11-02 DIAGNOSIS — Z955 Presence of coronary angioplasty implant and graft: Secondary | ICD-10-CM | POA: Diagnosis not present

## 2021-11-02 DIAGNOSIS — E875 Hyperkalemia: Secondary | ICD-10-CM | POA: Diagnosis not present

## 2021-11-02 DIAGNOSIS — M791 Myalgia, unspecified site: Secondary | ICD-10-CM | POA: Diagnosis not present

## 2021-11-02 DIAGNOSIS — Z794 Long term (current) use of insulin: Secondary | ICD-10-CM | POA: Diagnosis not present

## 2021-11-02 DIAGNOSIS — E7800 Pure hypercholesterolemia, unspecified: Secondary | ICD-10-CM | POA: Diagnosis not present

## 2021-11-02 DIAGNOSIS — T466X5A Adverse effect of antihyperlipidemic and antiarteriosclerotic drugs, initial encounter: Secondary | ICD-10-CM | POA: Diagnosis not present

## 2021-11-02 DIAGNOSIS — E1140 Type 2 diabetes mellitus with diabetic neuropathy, unspecified: Secondary | ICD-10-CM | POA: Diagnosis not present

## 2021-11-02 DIAGNOSIS — I10 Essential (primary) hypertension: Secondary | ICD-10-CM | POA: Diagnosis not present

## 2021-11-22 DIAGNOSIS — E1140 Type 2 diabetes mellitus with diabetic neuropathy, unspecified: Secondary | ICD-10-CM | POA: Diagnosis not present

## 2021-11-22 DIAGNOSIS — Z794 Long term (current) use of insulin: Secondary | ICD-10-CM | POA: Diagnosis not present

## 2021-12-02 DIAGNOSIS — Z794 Long term (current) use of insulin: Secondary | ICD-10-CM | POA: Diagnosis not present

## 2021-12-02 DIAGNOSIS — E7800 Pure hypercholesterolemia, unspecified: Secondary | ICD-10-CM | POA: Diagnosis not present

## 2021-12-02 DIAGNOSIS — M791 Myalgia, unspecified site: Secondary | ICD-10-CM | POA: Diagnosis not present

## 2021-12-02 DIAGNOSIS — Z955 Presence of coronary angioplasty implant and graft: Secondary | ICD-10-CM | POA: Diagnosis not present

## 2021-12-02 DIAGNOSIS — T466X5A Adverse effect of antihyperlipidemic and antiarteriosclerotic drugs, initial encounter: Secondary | ICD-10-CM | POA: Diagnosis not present

## 2021-12-02 DIAGNOSIS — E1140 Type 2 diabetes mellitus with diabetic neuropathy, unspecified: Secondary | ICD-10-CM | POA: Diagnosis not present

## 2021-12-02 DIAGNOSIS — I10 Essential (primary) hypertension: Secondary | ICD-10-CM | POA: Diagnosis not present

## 2021-12-07 DIAGNOSIS — L84 Corns and callosities: Secondary | ICD-10-CM | POA: Diagnosis not present

## 2021-12-07 DIAGNOSIS — M79676 Pain in unspecified toe(s): Secondary | ICD-10-CM | POA: Diagnosis not present

## 2021-12-07 DIAGNOSIS — B351 Tinea unguium: Secondary | ICD-10-CM | POA: Diagnosis not present

## 2021-12-07 DIAGNOSIS — E1142 Type 2 diabetes mellitus with diabetic polyneuropathy: Secondary | ICD-10-CM | POA: Diagnosis not present

## 2021-12-23 ENCOUNTER — Other Ambulatory Visit: Payer: Self-pay | Admitting: Cardiovascular Disease

## 2021-12-23 DIAGNOSIS — G629 Polyneuropathy, unspecified: Secondary | ICD-10-CM | POA: Diagnosis not present

## 2021-12-23 DIAGNOSIS — Z79899 Other long term (current) drug therapy: Secondary | ICD-10-CM | POA: Diagnosis not present

## 2021-12-23 DIAGNOSIS — E1165 Type 2 diabetes mellitus with hyperglycemia: Secondary | ICD-10-CM | POA: Diagnosis not present

## 2021-12-24 ENCOUNTER — Other Ambulatory Visit: Payer: Self-pay | Admitting: *Deleted

## 2021-12-24 DIAGNOSIS — Z87891 Personal history of nicotine dependence: Secondary | ICD-10-CM

## 2022-01-06 ENCOUNTER — Encounter: Payer: Self-pay | Admitting: Neurology

## 2022-01-06 ENCOUNTER — Ambulatory Visit: Payer: Medicare Other | Admitting: Neurology

## 2022-01-07 DIAGNOSIS — Z794 Long term (current) use of insulin: Secondary | ICD-10-CM | POA: Diagnosis not present

## 2022-01-07 DIAGNOSIS — E1140 Type 2 diabetes mellitus with diabetic neuropathy, unspecified: Secondary | ICD-10-CM | POA: Diagnosis not present

## 2022-01-10 ENCOUNTER — Other Ambulatory Visit: Payer: Self-pay | Admitting: Cardiovascular Disease

## 2022-01-14 ENCOUNTER — Ambulatory Visit: Payer: Medicare Other

## 2022-01-20 DIAGNOSIS — Z794 Long term (current) use of insulin: Secondary | ICD-10-CM | POA: Diagnosis not present

## 2022-01-20 DIAGNOSIS — I10 Essential (primary) hypertension: Secondary | ICD-10-CM | POA: Diagnosis not present

## 2022-01-20 DIAGNOSIS — I252 Old myocardial infarction: Secondary | ICD-10-CM | POA: Diagnosis not present

## 2022-01-20 DIAGNOSIS — E7800 Pure hypercholesterolemia, unspecified: Secondary | ICD-10-CM | POA: Diagnosis not present

## 2022-01-20 DIAGNOSIS — E1140 Type 2 diabetes mellitus with diabetic neuropathy, unspecified: Secondary | ICD-10-CM | POA: Diagnosis not present

## 2022-01-21 DIAGNOSIS — Z79899 Other long term (current) drug therapy: Secondary | ICD-10-CM | POA: Diagnosis not present

## 2022-01-21 DIAGNOSIS — E1165 Type 2 diabetes mellitus with hyperglycemia: Secondary | ICD-10-CM | POA: Diagnosis not present

## 2022-01-21 DIAGNOSIS — Z794 Long term (current) use of insulin: Secondary | ICD-10-CM | POA: Diagnosis not present

## 2022-01-21 DIAGNOSIS — G629 Polyneuropathy, unspecified: Secondary | ICD-10-CM | POA: Diagnosis not present

## 2022-01-21 DIAGNOSIS — E1140 Type 2 diabetes mellitus with diabetic neuropathy, unspecified: Secondary | ICD-10-CM | POA: Diagnosis not present

## 2022-01-21 DIAGNOSIS — E119 Type 2 diabetes mellitus without complications: Secondary | ICD-10-CM | POA: Diagnosis not present

## 2022-02-04 ENCOUNTER — Ambulatory Visit
Admission: RE | Admit: 2022-02-04 | Discharge: 2022-02-04 | Disposition: A | Discharge status: Home / Self Care | Payer: Medicare Other | Source: Ambulatory Visit | Attending: Acute Care | Admitting: Acute Care

## 2022-02-04 ENCOUNTER — Other Ambulatory Visit: Payer: Medicare Other

## 2022-02-04 ENCOUNTER — Ambulatory Visit: Payer: Medicare Other

## 2022-02-04 DIAGNOSIS — Z87891 Personal history of nicotine dependence: Secondary | ICD-10-CM | POA: Diagnosis not present

## 2022-02-06 DIAGNOSIS — E1140 Type 2 diabetes mellitus with diabetic neuropathy, unspecified: Secondary | ICD-10-CM | POA: Diagnosis not present

## 2022-02-06 DIAGNOSIS — Z794 Long term (current) use of insulin: Secondary | ICD-10-CM | POA: Diagnosis not present

## 2022-02-07 ENCOUNTER — Other Ambulatory Visit: Payer: Self-pay

## 2022-02-07 DIAGNOSIS — Z87891 Personal history of nicotine dependence: Secondary | ICD-10-CM

## 2022-02-10 DIAGNOSIS — E1140 Type 2 diabetes mellitus with diabetic neuropathy, unspecified: Secondary | ICD-10-CM | POA: Diagnosis not present

## 2022-02-10 DIAGNOSIS — E7800 Pure hypercholesterolemia, unspecified: Secondary | ICD-10-CM | POA: Diagnosis not present

## 2022-02-10 DIAGNOSIS — I10 Essential (primary) hypertension: Secondary | ICD-10-CM | POA: Diagnosis not present

## 2022-02-10 DIAGNOSIS — Z794 Long term (current) use of insulin: Secondary | ICD-10-CM | POA: Diagnosis not present

## 2022-02-10 DIAGNOSIS — I252 Old myocardial infarction: Secondary | ICD-10-CM | POA: Diagnosis not present

## 2022-02-15 DIAGNOSIS — M79676 Pain in unspecified toe(s): Secondary | ICD-10-CM | POA: Diagnosis not present

## 2022-02-15 DIAGNOSIS — E1142 Type 2 diabetes mellitus with diabetic polyneuropathy: Secondary | ICD-10-CM | POA: Diagnosis not present

## 2022-02-15 DIAGNOSIS — B351 Tinea unguium: Secondary | ICD-10-CM | POA: Diagnosis not present

## 2022-02-15 DIAGNOSIS — L84 Corns and callosities: Secondary | ICD-10-CM | POA: Diagnosis not present

## 2022-02-16 ENCOUNTER — Other Ambulatory Visit: Payer: Self-pay

## 2022-02-16 ENCOUNTER — Encounter: Payer: Self-pay | Admitting: Cardiovascular Disease

## 2022-02-16 ENCOUNTER — Ambulatory Visit: Payer: Medicare Other | Admitting: Cardiovascular Disease

## 2022-02-16 VITALS — BP 130/68 | HR 64 | Ht 66.0 in | Wt 137.4 lb

## 2022-02-16 DIAGNOSIS — I214 Non-ST elevation (NSTEMI) myocardial infarction: Secondary | ICD-10-CM

## 2022-02-16 DIAGNOSIS — I6521 Occlusion and stenosis of right carotid artery: Secondary | ICD-10-CM

## 2022-02-16 DIAGNOSIS — Z789 Other specified health status: Secondary | ICD-10-CM

## 2022-02-16 DIAGNOSIS — I739 Peripheral vascular disease, unspecified: Secondary | ICD-10-CM

## 2022-02-16 DIAGNOSIS — I6523 Occlusion and stenosis of bilateral carotid arteries: Secondary | ICD-10-CM | POA: Diagnosis not present

## 2022-02-16 DIAGNOSIS — Z95828 Presence of other vascular implants and grafts: Secondary | ICD-10-CM | POA: Diagnosis not present

## 2022-02-16 NOTE — Assessment & Plan Note (Signed)
History of peripheral arterial disease status post left iliac stenting by myself 08/22/2016 which improvement in his left lower extremity.  I attempted to percutaneously recanalize his right common iliac artery CTO 10/13/2016 unsuccessfully.  I referred him to Dr. Myra Gianotti who performed left to right femorofemoral crossover grafting successfully 12/30/2016.  He currently denies claudication.  His Dopplers do however show potential "in-stent restenosis within his left common iliac artery stent along with stenosis of the proximal anastomosis of the left right femorofemoral crossover graft.  We will continue to follow him noninvasively. ?

## 2022-02-16 NOTE — Assessment & Plan Note (Signed)
History of CAD status post myocardial infarction in 2010.  He did have stenting at that time by Dr. Dickie La.  I catheterized him 03/28/2016 revealing a patent proximal LAD stent, 95% ostial circumflex, 70% mid AV groove circumflex and a long 70% proximal mid dominant RCA stenosis.  I thought his anatomy was mostly for CABG was performed was performed Dr. Laneta Simmers 04/22/2016.  He had a LIMA to his OM1, vein to OM 2 and a free RIMA to the RCA.  He currently denies chest pain or shortness of breath. ?

## 2022-02-16 NOTE — Assessment & Plan Note (Signed)
History of hyperlipidemia intolerant to statin therapy recently begun on Repatha by his PCP who follows his lipid profile. ?

## 2022-02-16 NOTE — Progress Notes (Signed)
? ? ? ?02/16/2022 ?Derrick [name]   ?1946-06-15  ?[id number] ? ?Primary Physician Derrick Pretty, MD ?Primary Cardiologist: Derrick Harp MD Derrick [name], [place] ? ?HPI:  Derrick [name] is a 76 y.o.  thin and fit-appearing married Caucasian male father of 60, grandfather to 3 grandchildren who I last saw in the office 11/06/2020.  He was referred by Derrick [name] for cardiovascular evaluation because of a prior history of coronary stenting. Factors include treated diabetes and hyperlipidemia intolerant to statin therapy. He smoked 45 pack years and quit at the time of his myocardial infarction in 2010. He does have a family history of heart disease with a father and brother both of whom had coronary artery bypass grafting. He'll myocardial infarction back in 2010 and had stenting performed by Derrick [name]. He does get occasional atypical chest pain every other month. There also is a question of moderate mitral regurgitation. A 2-D echocardiogram showed normal LV function with mild MR. The Myoview stress test which was read as intermediate risk with inferior and lateral ischemia. Based on this, the patient was referred for cardiac catheterization to define his anatomy. It should also be noted the patient has been having right hip pain thought to be arthritic for many years. ? ?I performed cardiac catheterization on him 03/28/16 revealing a patent proximal LAD stent, 95% ostial circumflex, 70% mid AV groove circumflex a long 70% proximal and mid dominant RCA stenosis. I thought his anatomy was most suited for coronary artery bypass grafting which was performed by Derrick [name] on 04/22/16. The LIMA graft to his OM1, vein to OM 2 and a free RIMA to the RCA. His postoperative course was uncomplicated. He is back to work now denying chest pain or shortness of breath. His major issue now is bilateral hip and calf claudication which is lifestyle limiting. He presents today for angiography and potential right common iliac artery  PTA and stenting for chronic total occlusion. He had left common iliac artery stenting 08/22/16 successfully improving his left lower extremity . I attempted to percutaneously recanalized his right common iliac chronic total occlusion of 10/13/16 unsuccessfully. He continues to have lifestyle limiting right hip claudication. We talked about options and the patient desires to proceed with surgical revascularization. I'm referred him to Derrick [name] for consideration of left to right femorofemoral crossover grafting which was performed on 12/30/16 successfully. He had a postop visit with Derrick [name] one month later and was released back to my care after that. He says his claudication is somewhat improved. Recent Dopplers performed 03/09/2018 showed increased velocities within his left common iliac artery stent and of the proximal anastomosis of the graft although he continues to deny claudication. ?  ?Since I saw him a year ago he has remained stable.  He denies chest pain or shortness of breath.  He denies claudication.  His Dopplers do show in-stent restenosis which was left common iliac artery stenosis with narrowing of the proximal anastomosis of his left right femorofemoral crossover graft. ? ? ?Current Meds  ?Medication Sig  ? aspirin EC 81 MG EC tablet Take 1 tablet (81 mg total) by mouth daily.  ? Blood Glucose Monitoring Suppl (ONE TOUCH ULTRA SYSTEM KIT) W/DEVICE KIT Patient must check sugar TID  ? clopidogrel (PLAVIX) 75 MG tablet Take 1 tablet by mouth once daily  ? ezetimibe (ZETIA) 10 MG tablet Take 1 tablet by mouth daily.  ? glucose blood (ONE TOUCH ULTRA TEST) test strip USE ONE STRIP TO  CHECK GLUCOSE 4 TIMES DAILY AS NEEDED  ? HYDROcodone-acetaminophen (NORCO/VICODIN) 5-325 MG tablet Take 1 tablet by mouth every 6 (six) hours as needed for moderate pain. (Patient taking differently: Take 1 tablet by mouth daily as needed for moderate pain.)  ? insulin lispro (HUMALOG) 100 UNIT/ML injection up to 100 units  via insulin pump  ? lisinopril (PRINIVIL,ZESTRIL) 2.5 MG tablet Take 2.5 mg by mouth daily.  ? metoprolol tartrate (LOPRESSOR) 25 MG tablet Take 0.5 tablets (12.5 mg total) by mouth 2 (two) times daily.  ? ONETOUCH DELICA LANCETS 28N MISC USE ONE  4 TIMES DAILY AS NEEDED  ? pantoprazole (PROTONIX) 40 MG tablet Take 1 tablet by mouth once daily  ? REPATHA SURECLICK 867 MG/ML SOAJ [id number]:962 Milligram(s) SUB-Q Every 2 Weeks  ? research study medication Take 1 each by mouth daily. Study drug for cholesterol from Dr. Deland [name]  ?  ? ?Allergies  ?Allergen Reactions  ? Atorvastatin Other (See Comments)  ?  MYALGIAS ?MUSCLE CRAMPS ?  ? Lovastatin Other (See Comments)  ?  MYALGIAS ?MUSCLE CRAMPS  ? Pravastatin Other (See Comments)  ?  MYALGIAS ?MUSCLE CRAMPS  ? Simvastatin Other (See Comments)  ?  MYALGIAS ?MUSCLE CRAMPS  ? ? ?Social History  ? ?Socioeconomic History  ? Marital status: Married  ?  Spouse name: Not on file  ? Number of children: Not on file  ? Years of education: Not on file  ? Highest education level: Not on file  ?Occupational History  ? Not on file  ?Tobacco Use  ? Smoking status: Former  ?  Packs/day: 1.00  ?  Years: 50.00  ?  Pack years: 50.00  ?  Types: Cigarettes  ?  Quit date: 01/06/2009  ?  Years since quitting: 13.1  ? Smokeless tobacco: Never  ?Vaping Use  ? Vaping Use: Former  ?Substance and Sexual Activity  ? Alcohol use: No  ?  Alcohol/week: 0.0 standard drinks  ?  Comment: "drank some in high school"  ? Drug use: No  ? Sexual activity: Never  ?Other Topics Concern  ? Not on file  ?Social History Narrative  ? Married, works part time at [employer] in [place], [place].  ? Former smoker: quit 01/2011.  No alcohol or drugs.  ? No formal exercise.  ? ?Social Determinants of Health  ? ?Financial Resource Strain: Not on file  ?Food Insecurity: Not on file  ?Transportation Needs: Not on file  ?Physical Activity: Not on file  ?Stress: Not on file  ?Social Connections: Not on file  ?Intimate  Partner Violence: Not on file  ?  ? ?Review of Systems: ?General: negative for chills, fever, night sweats or weight changes.  ?Cardiovascular: negative for chest pain, dyspnea on exertion, edema, orthopnea, palpitations, paroxysmal nocturnal dyspnea or shortness of breath ?Dermatological: negative for rash ?Respiratory: negative for cough or wheezing ?Urologic: negative for hematuria ?Abdominal: negative for nausea, vomiting, diarrhea, bright red blood per rectum, melena, or hematemesis ?Neurologic: negative for visual changes, syncope, or dizziness ?All other systems reviewed and are otherwise negative except as noted above. ? ? ? ?Blood pressure 130/68, pulse 64, height $RemoveBe'5\' 6"'wUjIpvEgu$  (1.676 m), weight 137 lb 6.4 oz (62.3 kg), SpO2 99 %.  ?General appearance: alert and no distress ?Neck: no adenopathy, no carotid bruit, no JVD, supple, symmetrical, trachea midline, and thyroid not enlarged, symmetric, no tenderness/mass/nodules ?Lungs: clear to auscultation bilaterally ?Heart: regular rate and rhythm, S1, S2 normal, no murmur, click, rub or gallop ?Extremities:  extremities normal, atraumatic, no cyanosis or edema ?Pulses: Diminished pedal pulses ?Skin: Skin color, texture, turgor normal. No rashes or lesions ?Neurologic: Grossly normal ? ?EKG sinus rhythm at 64 with nonspecific ST and T wave changes.  Personally reviewed this EKG. ? ?ASSESSMENT AND PLAN:  ? ?Statin intolerance ?History of hyperlipidemia intolerant to statin therapy recently begun on Repatha by his PCP who follows his lipid profile. ? ?Acute myocardial infarction, subendocardial infarction [facility]) ?History of CAD status post myocardial infarction in 2010.  He did have stenting at that time by Dr. Maurene Capes.  I catheterized him 03/28/2016 revealing a patent proximal LAD stent, 95% ostial circumflex, 70% mid AV groove circumflex and a long 70% proximal mid dominant RCA stenosis.  I thought his anatomy was mostly for CABG was performed was performed Derrick [name]  04/22/2016.  He had a LIMA to his OM1, vein to OM 2 and a free RIMA to the RCA.  He currently denies chest pain or shortness of breath. ? ?Hypertension associated with diabetes ([place]) ?History of essential hyp

## 2022-02-16 NOTE — Assessment & Plan Note (Signed)
History of moderate right ICA stenosis by duplex ultrasound most recently performed 06/26/2020.  This will be repeated. ?

## 2022-02-16 NOTE — Assessment & Plan Note (Signed)
History of essential hypertension blood pressure measured today at 130/68.  He is on metoprolol, and lisinopril. ?

## 2022-02-16 NOTE — Patient Instructions (Addendum)
Medication Instructions:  ?Your physician recommends that you continue on your current medications as directed. Please refer to the Current Medication list given to you today. ? ?*If you need a refill on your cardiac medications before your next appointment, please call your pharmacy* ? ? ?Testing/Procedures: ?Your physician has requested that you have a carotid duplex. This test is an ultrasound of the carotid arteries in your neck. It looks at blood flow through these arteries that supply the brain with blood. Allow one hour for this exam. There are no restrictions or special instructions. ? ? ?Dr. Gwenlyn Found has recommended that you have an Ultrasound of your AORTA/IVC/ILIACS.  ? ?To prepare for this test: ? ?No food after 11PM the night before. Water is OK. (Don't drink liquids if you have been instructed not to for ANOTHER test).  ?Avoid foods that produce bowel gas, for 24 hours prior to exam (see below). ?No breakfast, no chewing gum, no smoking or carbonated beverages. ?Patient may take morning medications with water. ?Come in for test at least 15 minutes early to register. ? ?Your physician has requested that you have an ankle brachial index (ABI). During this test an ultrasound and blood pressure cuff are used to evaluate the arteries that supply the arms and legs with blood. Allow thirty minutes for this exam. There are no restrictions or special instructions. ?These procedures will be done at [place] ? ?Follow-Up: ?At [facility], you and your health needs are our priority.  As part of our continuing mission to provide you with exceptional heart care, we have created designated Provider Care Teams.  These Care Teams include your primary Cardiologist (physician) and Advanced Practice Providers (APPs -  Physician Assistants and Nurse Practitioners) who all work together to provide you with the care you need, when you need it. ? ?We recommend signing up for the patient portal called  "MyChart".  Sign up information is provided on this After Visit Summary.  MyChart is used to connect with patients for Virtual Visits (Telemedicine).  Patients are able to view lab/test results, encounter notes, upcoming appointments, etc.  Non-urgent messages can be sent to your provider as well.   ?To learn more about what you can do with MyChart, go to [email or web address].   ? ?Your next appointment:   ?12 month(s) ? ?The format for your next appointment:   ?In Person ? ?Provider:   ?Quay Burow, MD ?

## 2022-02-18 DIAGNOSIS — E1165 Type 2 diabetes mellitus with hyperglycemia: Secondary | ICD-10-CM | POA: Diagnosis not present

## 2022-02-18 DIAGNOSIS — G629 Polyneuropathy, unspecified: Secondary | ICD-10-CM | POA: Diagnosis not present

## 2022-02-18 DIAGNOSIS — E119 Type 2 diabetes mellitus without complications: Secondary | ICD-10-CM | POA: Diagnosis not present

## 2022-02-18 DIAGNOSIS — Z79899 Other long term (current) drug therapy: Secondary | ICD-10-CM | POA: Diagnosis not present

## 2022-02-18 DIAGNOSIS — Z6822 Body mass index (BMI) 22.0-22.9, adult: Secondary | ICD-10-CM | POA: Diagnosis not present

## 2022-03-08 DIAGNOSIS — Z794 Long term (current) use of insulin: Secondary | ICD-10-CM | POA: Diagnosis not present

## 2022-03-08 DIAGNOSIS — E1140 Type 2 diabetes mellitus with diabetic neuropathy, unspecified: Secondary | ICD-10-CM | POA: Diagnosis not present

## 2022-03-24 ENCOUNTER — Ambulatory Visit (HOSPITAL_COMMUNITY)
Admission: RE | Admit: 2022-03-24 | Discharge: 2022-03-24 | Disposition: A | Discharge status: Home / Self Care | Payer: Medicare Other | Source: Ambulatory Visit | Attending: Cardiology | Admitting: Cardiology

## 2022-03-24 ENCOUNTER — Ambulatory Visit (HOSPITAL_BASED_OUTPATIENT_CLINIC_OR_DEPARTMENT_OTHER)
Admission: RE | Admit: 2022-03-24 | Discharge: 2022-03-24 | Disposition: A | Discharge status: Home / Self Care | Payer: Medicare Other | Source: Ambulatory Visit | Attending: Cardiovascular Disease | Admitting: Cardiovascular Disease

## 2022-03-24 DIAGNOSIS — Z95828 Presence of other vascular implants and grafts: Secondary | ICD-10-CM | POA: Insufficient documentation

## 2022-03-24 DIAGNOSIS — I6523 Occlusion and stenosis of bilateral carotid arteries: Secondary | ICD-10-CM | POA: Diagnosis not present

## 2022-03-25 ENCOUNTER — Other Ambulatory Visit: Payer: Self-pay | Admitting: Cardiovascular Disease

## 2022-03-31 DIAGNOSIS — E119 Type 2 diabetes mellitus without complications: Secondary | ICD-10-CM | POA: Diagnosis not present

## 2022-03-31 DIAGNOSIS — E1140 Type 2 diabetes mellitus with diabetic neuropathy, unspecified: Secondary | ICD-10-CM | POA: Diagnosis not present

## 2022-04-01 DIAGNOSIS — G629 Polyneuropathy, unspecified: Secondary | ICD-10-CM | POA: Diagnosis not present

## 2022-04-01 DIAGNOSIS — E119 Type 2 diabetes mellitus without complications: Secondary | ICD-10-CM | POA: Diagnosis not present

## 2022-04-01 DIAGNOSIS — E1165 Type 2 diabetes mellitus with hyperglycemia: Secondary | ICD-10-CM | POA: Diagnosis not present

## 2022-04-01 DIAGNOSIS — Z79899 Other long term (current) drug therapy: Secondary | ICD-10-CM | POA: Diagnosis not present

## 2022-04-01 DIAGNOSIS — Z6822 Body mass index (BMI) 22.0-22.9, adult: Secondary | ICD-10-CM | POA: Diagnosis not present

## 2022-04-11 ENCOUNTER — Other Ambulatory Visit: Payer: Self-pay | Admitting: Cardiovascular Disease

## 2022-04-13 DIAGNOSIS — E7800 Pure hypercholesterolemia, unspecified: Secondary | ICD-10-CM | POA: Diagnosis not present

## 2022-04-13 DIAGNOSIS — E1140 Type 2 diabetes mellitus with diabetic neuropathy, unspecified: Secondary | ICD-10-CM | POA: Diagnosis not present

## 2022-04-13 DIAGNOSIS — I10 Essential (primary) hypertension: Secondary | ICD-10-CM | POA: Diagnosis not present

## 2022-04-13 DIAGNOSIS — I252 Old myocardial infarction: Secondary | ICD-10-CM | POA: Diagnosis not present

## 2022-04-13 DIAGNOSIS — Z794 Long term (current) use of insulin: Secondary | ICD-10-CM | POA: Diagnosis not present

## 2022-04-21 DIAGNOSIS — E119 Type 2 diabetes mellitus without complications: Secondary | ICD-10-CM | POA: Diagnosis not present

## 2022-04-21 DIAGNOSIS — Z794 Long term (current) use of insulin: Secondary | ICD-10-CM | POA: Diagnosis not present

## 2022-04-21 DIAGNOSIS — E1140 Type 2 diabetes mellitus with diabetic neuropathy, unspecified: Secondary | ICD-10-CM | POA: Diagnosis not present

## 2022-04-25 DIAGNOSIS — E1140 Type 2 diabetes mellitus with diabetic neuropathy, unspecified: Secondary | ICD-10-CM | POA: Diagnosis not present

## 2022-04-25 DIAGNOSIS — Z794 Long term (current) use of insulin: Secondary | ICD-10-CM | POA: Diagnosis not present

## 2022-04-25 DIAGNOSIS — E119 Type 2 diabetes mellitus without complications: Secondary | ICD-10-CM | POA: Diagnosis not present

## 2022-04-26 DIAGNOSIS — M79676 Pain in unspecified toe(s): Secondary | ICD-10-CM | POA: Diagnosis not present

## 2022-04-26 DIAGNOSIS — B351 Tinea unguium: Secondary | ICD-10-CM | POA: Diagnosis not present

## 2022-04-26 DIAGNOSIS — L84 Corns and callosities: Secondary | ICD-10-CM | POA: Diagnosis not present

## 2022-04-26 DIAGNOSIS — E1142 Type 2 diabetes mellitus with diabetic polyneuropathy: Secondary | ICD-10-CM | POA: Diagnosis not present

## 2022-04-29 DIAGNOSIS — E1165 Type 2 diabetes mellitus with hyperglycemia: Secondary | ICD-10-CM | POA: Diagnosis not present

## 2022-04-29 DIAGNOSIS — Z6821 Body mass index (BMI) 21.0-21.9, adult: Secondary | ICD-10-CM | POA: Diagnosis not present

## 2022-04-29 DIAGNOSIS — Z79899 Other long term (current) drug therapy: Secondary | ICD-10-CM | POA: Diagnosis not present

## 2022-04-29 DIAGNOSIS — G629 Polyneuropathy, unspecified: Secondary | ICD-10-CM | POA: Diagnosis not present

## 2022-04-30 DIAGNOSIS — E119 Type 2 diabetes mellitus without complications: Secondary | ICD-10-CM | POA: Diagnosis not present

## 2022-04-30 DIAGNOSIS — E1140 Type 2 diabetes mellitus with diabetic neuropathy, unspecified: Secondary | ICD-10-CM | POA: Diagnosis not present

## 2022-05-11 DIAGNOSIS — E7800 Pure hypercholesterolemia, unspecified: Secondary | ICD-10-CM | POA: Diagnosis not present

## 2022-05-11 DIAGNOSIS — Z794 Long term (current) use of insulin: Secondary | ICD-10-CM | POA: Diagnosis not present

## 2022-05-11 DIAGNOSIS — E1140 Type 2 diabetes mellitus with diabetic neuropathy, unspecified: Secondary | ICD-10-CM | POA: Diagnosis not present

## 2022-05-11 DIAGNOSIS — I10 Essential (primary) hypertension: Secondary | ICD-10-CM | POA: Diagnosis not present

## 2022-05-11 DIAGNOSIS — I252 Old myocardial infarction: Secondary | ICD-10-CM | POA: Diagnosis not present

## 2022-05-31 DIAGNOSIS — G629 Polyneuropathy, unspecified: Secondary | ICD-10-CM | POA: Diagnosis not present

## 2022-05-31 DIAGNOSIS — E1140 Type 2 diabetes mellitus with diabetic neuropathy, unspecified: Secondary | ICD-10-CM | POA: Diagnosis not present

## 2022-05-31 DIAGNOSIS — Z6821 Body mass index (BMI) 21.0-21.9, adult: Secondary | ICD-10-CM | POA: Diagnosis not present

## 2022-05-31 DIAGNOSIS — E119 Type 2 diabetes mellitus without complications: Secondary | ICD-10-CM | POA: Diagnosis not present

## 2022-05-31 DIAGNOSIS — Z79899 Other long term (current) drug therapy: Secondary | ICD-10-CM | POA: Diagnosis not present

## 2022-05-31 DIAGNOSIS — E1165 Type 2 diabetes mellitus with hyperglycemia: Secondary | ICD-10-CM | POA: Diagnosis not present

## 2022-06-14 DIAGNOSIS — Z794 Long term (current) use of insulin: Secondary | ICD-10-CM | POA: Diagnosis not present

## 2022-06-14 DIAGNOSIS — E1140 Type 2 diabetes mellitus with diabetic neuropathy, unspecified: Secondary | ICD-10-CM | POA: Diagnosis not present

## 2022-06-14 DIAGNOSIS — E119 Type 2 diabetes mellitus without complications: Secondary | ICD-10-CM | POA: Diagnosis not present

## 2022-06-22 DIAGNOSIS — Z79899 Other long term (current) drug therapy: Secondary | ICD-10-CM | POA: Diagnosis not present

## 2022-06-22 DIAGNOSIS — E1165 Type 2 diabetes mellitus with hyperglycemia: Secondary | ICD-10-CM | POA: Diagnosis not present

## 2022-06-22 DIAGNOSIS — G629 Polyneuropathy, unspecified: Secondary | ICD-10-CM | POA: Diagnosis not present

## 2022-06-22 DIAGNOSIS — Z6821 Body mass index (BMI) 21.0-21.9, adult: Secondary | ICD-10-CM | POA: Diagnosis not present

## 2022-06-22 DIAGNOSIS — E119 Type 2 diabetes mellitus without complications: Secondary | ICD-10-CM | POA: Diagnosis not present

## 2022-06-30 DIAGNOSIS — E1140 Type 2 diabetes mellitus with diabetic neuropathy, unspecified: Secondary | ICD-10-CM | POA: Diagnosis not present

## 2022-06-30 DIAGNOSIS — E119 Type 2 diabetes mellitus without complications: Secondary | ICD-10-CM | POA: Diagnosis not present

## 2022-07-01 ENCOUNTER — Other Ambulatory Visit: Payer: Self-pay | Admitting: Cardiovascular Disease

## 2022-07-12 DIAGNOSIS — E1142 Type 2 diabetes mellitus with diabetic polyneuropathy: Secondary | ICD-10-CM | POA: Diagnosis not present

## 2022-07-12 DIAGNOSIS — L84 Corns and callosities: Secondary | ICD-10-CM | POA: Diagnosis not present

## 2022-07-12 DIAGNOSIS — B351 Tinea unguium: Secondary | ICD-10-CM | POA: Diagnosis not present

## 2022-07-12 DIAGNOSIS — M79676 Pain in unspecified toe(s): Secondary | ICD-10-CM | POA: Diagnosis not present

## 2022-07-13 DIAGNOSIS — I252 Old myocardial infarction: Secondary | ICD-10-CM | POA: Diagnosis not present

## 2022-07-13 DIAGNOSIS — Z794 Long term (current) use of insulin: Secondary | ICD-10-CM | POA: Diagnosis not present

## 2022-07-13 DIAGNOSIS — I10 Essential (primary) hypertension: Secondary | ICD-10-CM | POA: Diagnosis not present

## 2022-07-13 DIAGNOSIS — E1140 Type 2 diabetes mellitus with diabetic neuropathy, unspecified: Secondary | ICD-10-CM | POA: Diagnosis not present

## 2022-07-13 DIAGNOSIS — E7800 Pure hypercholesterolemia, unspecified: Secondary | ICD-10-CM | POA: Diagnosis not present

## 2022-07-20 DIAGNOSIS — E119 Type 2 diabetes mellitus without complications: Secondary | ICD-10-CM | POA: Diagnosis not present

## 2022-07-20 DIAGNOSIS — Z794 Long term (current) use of insulin: Secondary | ICD-10-CM | POA: Diagnosis not present

## 2022-07-20 DIAGNOSIS — E1140 Type 2 diabetes mellitus with diabetic neuropathy, unspecified: Secondary | ICD-10-CM | POA: Diagnosis not present

## 2022-07-21 DIAGNOSIS — E1165 Type 2 diabetes mellitus with hyperglycemia: Secondary | ICD-10-CM | POA: Diagnosis not present

## 2022-07-21 DIAGNOSIS — G629 Polyneuropathy, unspecified: Secondary | ICD-10-CM | POA: Diagnosis not present

## 2022-07-21 DIAGNOSIS — Z79899 Other long term (current) drug therapy: Secondary | ICD-10-CM | POA: Diagnosis not present

## 2022-07-21 DIAGNOSIS — E119 Type 2 diabetes mellitus without complications: Secondary | ICD-10-CM | POA: Diagnosis not present

## 2022-07-21 DIAGNOSIS — Z6820 Body mass index (BMI) 20.0-20.9, adult: Secondary | ICD-10-CM | POA: Diagnosis not present

## 2022-07-27 DIAGNOSIS — E119 Type 2 diabetes mellitus without complications: Secondary | ICD-10-CM | POA: Diagnosis not present

## 2022-07-27 DIAGNOSIS — Z794 Long term (current) use of insulin: Secondary | ICD-10-CM | POA: Diagnosis not present

## 2022-07-27 DIAGNOSIS — E1140 Type 2 diabetes mellitus with diabetic neuropathy, unspecified: Secondary | ICD-10-CM | POA: Diagnosis not present

## 2022-07-31 DIAGNOSIS — E1140 Type 2 diabetes mellitus with diabetic neuropathy, unspecified: Secondary | ICD-10-CM | POA: Diagnosis not present

## 2022-07-31 DIAGNOSIS — E119 Type 2 diabetes mellitus without complications: Secondary | ICD-10-CM | POA: Diagnosis not present

## 2022-08-03 DIAGNOSIS — I252 Old myocardial infarction: Secondary | ICD-10-CM | POA: Diagnosis not present

## 2022-08-03 DIAGNOSIS — E1140 Type 2 diabetes mellitus with diabetic neuropathy, unspecified: Secondary | ICD-10-CM | POA: Diagnosis not present

## 2022-08-03 DIAGNOSIS — E7800 Pure hypercholesterolemia, unspecified: Secondary | ICD-10-CM | POA: Diagnosis not present

## 2022-08-03 DIAGNOSIS — I10 Essential (primary) hypertension: Secondary | ICD-10-CM | POA: Diagnosis not present

## 2022-08-03 DIAGNOSIS — Z794 Long term (current) use of insulin: Secondary | ICD-10-CM | POA: Diagnosis not present

## 2022-08-23 DIAGNOSIS — E1165 Type 2 diabetes mellitus with hyperglycemia: Secondary | ICD-10-CM | POA: Diagnosis not present

## 2022-08-23 DIAGNOSIS — G629 Polyneuropathy, unspecified: Secondary | ICD-10-CM | POA: Diagnosis not present

## 2022-08-23 DIAGNOSIS — Z6820 Body mass index (BMI) 20.0-20.9, adult: Secondary | ICD-10-CM | POA: Diagnosis not present

## 2022-08-23 DIAGNOSIS — E119 Type 2 diabetes mellitus without complications: Secondary | ICD-10-CM | POA: Diagnosis not present

## 2022-08-31 DIAGNOSIS — E1140 Type 2 diabetes mellitus with diabetic neuropathy, unspecified: Secondary | ICD-10-CM | POA: Diagnosis not present

## 2022-08-31 DIAGNOSIS — E119 Type 2 diabetes mellitus without complications: Secondary | ICD-10-CM | POA: Diagnosis not present

## 2022-09-01 DIAGNOSIS — Z794 Long term (current) use of insulin: Secondary | ICD-10-CM | POA: Diagnosis not present

## 2022-09-01 DIAGNOSIS — I10 Essential (primary) hypertension: Secondary | ICD-10-CM | POA: Diagnosis not present

## 2022-09-01 DIAGNOSIS — E875 Hyperkalemia: Secondary | ICD-10-CM | POA: Diagnosis not present

## 2022-09-01 DIAGNOSIS — I252 Old myocardial infarction: Secondary | ICD-10-CM | POA: Diagnosis not present

## 2022-09-01 DIAGNOSIS — E7800 Pure hypercholesterolemia, unspecified: Secondary | ICD-10-CM | POA: Diagnosis not present

## 2022-09-01 DIAGNOSIS — E1140 Type 2 diabetes mellitus with diabetic neuropathy, unspecified: Secondary | ICD-10-CM | POA: Diagnosis not present

## 2022-09-12 DIAGNOSIS — Z794 Long term (current) use of insulin: Secondary | ICD-10-CM | POA: Diagnosis not present

## 2022-09-12 DIAGNOSIS — E1140 Type 2 diabetes mellitus with diabetic neuropathy, unspecified: Secondary | ICD-10-CM | POA: Diagnosis not present

## 2022-09-12 DIAGNOSIS — E119 Type 2 diabetes mellitus without complications: Secondary | ICD-10-CM | POA: Diagnosis not present

## 2022-09-20 DIAGNOSIS — L84 Corns and callosities: Secondary | ICD-10-CM | POA: Diagnosis not present

## 2022-09-20 DIAGNOSIS — B351 Tinea unguium: Secondary | ICD-10-CM | POA: Diagnosis not present

## 2022-09-20 DIAGNOSIS — E1142 Type 2 diabetes mellitus with diabetic polyneuropathy: Secondary | ICD-10-CM | POA: Diagnosis not present

## 2022-09-20 DIAGNOSIS — M79676 Pain in unspecified toe(s): Secondary | ICD-10-CM | POA: Diagnosis not present

## 2022-09-22 DIAGNOSIS — E1169 Type 2 diabetes mellitus with other specified complication: Secondary | ICD-10-CM | POA: Diagnosis not present

## 2022-09-22 DIAGNOSIS — E119 Type 2 diabetes mellitus without complications: Secondary | ICD-10-CM | POA: Diagnosis not present

## 2022-09-22 DIAGNOSIS — Z6820 Body mass index (BMI) 20.0-20.9, adult: Secondary | ICD-10-CM | POA: Diagnosis not present

## 2022-09-22 DIAGNOSIS — Z79899 Other long term (current) drug therapy: Secondary | ICD-10-CM | POA: Diagnosis not present

## 2022-09-22 DIAGNOSIS — G629 Polyneuropathy, unspecified: Secondary | ICD-10-CM | POA: Diagnosis not present

## 2022-09-22 DIAGNOSIS — E1165 Type 2 diabetes mellitus with hyperglycemia: Secondary | ICD-10-CM | POA: Diagnosis not present

## 2022-09-23 DIAGNOSIS — R062 Wheezing: Secondary | ICD-10-CM | POA: Diagnosis not present

## 2022-09-23 DIAGNOSIS — J988 Other specified respiratory disorders: Secondary | ICD-10-CM | POA: Diagnosis not present

## 2022-09-23 DIAGNOSIS — R051 Acute cough: Secondary | ICD-10-CM | POA: Diagnosis not present

## 2022-09-23 DIAGNOSIS — B9689 Other specified bacterial agents as the cause of diseases classified elsewhere: Secondary | ICD-10-CM | POA: Diagnosis not present

## 2022-09-30 DIAGNOSIS — E119 Type 2 diabetes mellitus without complications: Secondary | ICD-10-CM | POA: Diagnosis not present

## 2022-09-30 DIAGNOSIS — E1140 Type 2 diabetes mellitus with diabetic neuropathy, unspecified: Secondary | ICD-10-CM | POA: Diagnosis not present

## 2022-10-13 DIAGNOSIS — E1140 Type 2 diabetes mellitus with diabetic neuropathy, unspecified: Secondary | ICD-10-CM | POA: Diagnosis not present

## 2022-10-13 DIAGNOSIS — Z794 Long term (current) use of insulin: Secondary | ICD-10-CM | POA: Diagnosis not present

## 2022-10-13 DIAGNOSIS — I252 Old myocardial infarction: Secondary | ICD-10-CM | POA: Diagnosis not present

## 2022-10-13 DIAGNOSIS — I10 Essential (primary) hypertension: Secondary | ICD-10-CM | POA: Diagnosis not present

## 2022-10-13 DIAGNOSIS — E7800 Pure hypercholesterolemia, unspecified: Secondary | ICD-10-CM | POA: Diagnosis not present

## 2022-10-18 DIAGNOSIS — Z794 Long term (current) use of insulin: Secondary | ICD-10-CM | POA: Diagnosis not present

## 2022-10-18 DIAGNOSIS — E1140 Type 2 diabetes mellitus with diabetic neuropathy, unspecified: Secondary | ICD-10-CM | POA: Diagnosis not present

## 2022-10-18 DIAGNOSIS — E119 Type 2 diabetes mellitus without complications: Secondary | ICD-10-CM | POA: Diagnosis not present

## 2022-10-25 DIAGNOSIS — Z79899 Other long term (current) drug therapy: Secondary | ICD-10-CM | POA: Diagnosis not present

## 2022-10-25 DIAGNOSIS — E1165 Type 2 diabetes mellitus with hyperglycemia: Secondary | ICD-10-CM | POA: Diagnosis not present

## 2022-10-25 DIAGNOSIS — R030 Elevated blood-pressure reading, without diagnosis of hypertension: Secondary | ICD-10-CM | POA: Diagnosis not present

## 2022-10-25 DIAGNOSIS — G629 Polyneuropathy, unspecified: Secondary | ICD-10-CM | POA: Diagnosis not present

## 2022-10-25 DIAGNOSIS — E119 Type 2 diabetes mellitus without complications: Secondary | ICD-10-CM | POA: Diagnosis not present

## 2022-10-25 DIAGNOSIS — Z6820 Body mass index (BMI) 20.0-20.9, adult: Secondary | ICD-10-CM | POA: Diagnosis not present

## 2022-10-26 DIAGNOSIS — J432 Centrilobular emphysema: Secondary | ICD-10-CM | POA: Diagnosis not present

## 2022-10-26 DIAGNOSIS — K219 Gastro-esophageal reflux disease without esophagitis: Secondary | ICD-10-CM | POA: Diagnosis not present

## 2022-10-26 DIAGNOSIS — E7800 Pure hypercholesterolemia, unspecified: Secondary | ICD-10-CM | POA: Diagnosis not present

## 2022-10-26 DIAGNOSIS — Z7982 Long term (current) use of aspirin: Secondary | ICD-10-CM | POA: Diagnosis not present

## 2022-10-26 DIAGNOSIS — Z0000 Encounter for general adult medical examination without abnormal findings: Secondary | ICD-10-CM | POA: Diagnosis not present

## 2022-10-26 DIAGNOSIS — Z955 Presence of coronary angioplasty implant and graft: Secondary | ICD-10-CM | POA: Diagnosis not present

## 2022-10-26 DIAGNOSIS — I10 Essential (primary) hypertension: Secondary | ICD-10-CM | POA: Diagnosis not present

## 2022-10-28 DIAGNOSIS — E1140 Type 2 diabetes mellitus with diabetic neuropathy, unspecified: Secondary | ICD-10-CM | POA: Diagnosis not present

## 2022-10-28 DIAGNOSIS — Z794 Long term (current) use of insulin: Secondary | ICD-10-CM | POA: Diagnosis not present

## 2022-10-28 DIAGNOSIS — E119 Type 2 diabetes mellitus without complications: Secondary | ICD-10-CM | POA: Diagnosis not present

## 2022-10-31 DIAGNOSIS — E119 Type 2 diabetes mellitus without complications: Secondary | ICD-10-CM | POA: Diagnosis not present

## 2022-10-31 DIAGNOSIS — E1140 Type 2 diabetes mellitus with diabetic neuropathy, unspecified: Secondary | ICD-10-CM | POA: Diagnosis not present

## 2022-11-22 DIAGNOSIS — E119 Type 2 diabetes mellitus without complications: Secondary | ICD-10-CM | POA: Diagnosis not present

## 2022-11-22 DIAGNOSIS — G629 Polyneuropathy, unspecified: Secondary | ICD-10-CM | POA: Diagnosis not present

## 2022-11-22 DIAGNOSIS — Z79899 Other long term (current) drug therapy: Secondary | ICD-10-CM | POA: Diagnosis not present

## 2022-11-22 DIAGNOSIS — Z6821 Body mass index (BMI) 21.0-21.9, adult: Secondary | ICD-10-CM | POA: Diagnosis not present

## 2022-11-22 DIAGNOSIS — E1165 Type 2 diabetes mellitus with hyperglycemia: Secondary | ICD-10-CM | POA: Diagnosis not present

## 2022-11-22 DIAGNOSIS — R030 Elevated blood-pressure reading, without diagnosis of hypertension: Secondary | ICD-10-CM | POA: Diagnosis not present

## 2022-11-30 DIAGNOSIS — E119 Type 2 diabetes mellitus without complications: Secondary | ICD-10-CM | POA: Diagnosis not present

## 2022-11-30 DIAGNOSIS — E1140 Type 2 diabetes mellitus with diabetic neuropathy, unspecified: Secondary | ICD-10-CM | POA: Diagnosis not present

## 2022-12-06 DIAGNOSIS — M79676 Pain in unspecified toe(s): Secondary | ICD-10-CM | POA: Diagnosis not present

## 2022-12-06 DIAGNOSIS — E1142 Type 2 diabetes mellitus with diabetic polyneuropathy: Secondary | ICD-10-CM | POA: Diagnosis not present

## 2022-12-06 DIAGNOSIS — B351 Tinea unguium: Secondary | ICD-10-CM | POA: Diagnosis not present

## 2022-12-06 DIAGNOSIS — L84 Corns and callosities: Secondary | ICD-10-CM | POA: Diagnosis not present

## 2022-12-21 DIAGNOSIS — Z79899 Other long term (current) drug therapy: Secondary | ICD-10-CM | POA: Diagnosis not present

## 2022-12-21 DIAGNOSIS — R030 Elevated blood-pressure reading, without diagnosis of hypertension: Secondary | ICD-10-CM | POA: Diagnosis not present

## 2022-12-21 DIAGNOSIS — Z6821 Body mass index (BMI) 21.0-21.9, adult: Secondary | ICD-10-CM | POA: Diagnosis not present

## 2022-12-21 DIAGNOSIS — E1165 Type 2 diabetes mellitus with hyperglycemia: Secondary | ICD-10-CM | POA: Diagnosis not present

## 2022-12-21 DIAGNOSIS — E119 Type 2 diabetes mellitus without complications: Secondary | ICD-10-CM | POA: Diagnosis not present

## 2022-12-21 DIAGNOSIS — G629 Polyneuropathy, unspecified: Secondary | ICD-10-CM | POA: Diagnosis not present

## 2022-12-23 ENCOUNTER — Other Ambulatory Visit (HOSPITAL_COMMUNITY): Payer: Self-pay

## 2022-12-31 DIAGNOSIS — E1140 Type 2 diabetes mellitus with diabetic neuropathy, unspecified: Secondary | ICD-10-CM | POA: Diagnosis not present

## 2022-12-31 DIAGNOSIS — E119 Type 2 diabetes mellitus without complications: Secondary | ICD-10-CM | POA: Diagnosis not present

## 2023-01-04 DIAGNOSIS — K219 Gastro-esophageal reflux disease without esophagitis: Secondary | ICD-10-CM | POA: Diagnosis not present

## 2023-01-04 DIAGNOSIS — E7800 Pure hypercholesterolemia, unspecified: Secondary | ICD-10-CM | POA: Diagnosis not present

## 2023-01-04 DIAGNOSIS — I10 Essential (primary) hypertension: Secondary | ICD-10-CM | POA: Diagnosis not present

## 2023-01-04 DIAGNOSIS — E119 Type 2 diabetes mellitus without complications: Secondary | ICD-10-CM | POA: Diagnosis not present

## 2023-01-12 DIAGNOSIS — I252 Old myocardial infarction: Secondary | ICD-10-CM | POA: Diagnosis not present

## 2023-01-12 DIAGNOSIS — I10 Essential (primary) hypertension: Secondary | ICD-10-CM | POA: Diagnosis not present

## 2023-01-12 DIAGNOSIS — Z794 Long term (current) use of insulin: Secondary | ICD-10-CM | POA: Diagnosis not present

## 2023-01-12 DIAGNOSIS — E1140 Type 2 diabetes mellitus with diabetic neuropathy, unspecified: Secondary | ICD-10-CM | POA: Diagnosis not present

## 2023-01-12 DIAGNOSIS — E7800 Pure hypercholesterolemia, unspecified: Secondary | ICD-10-CM | POA: Diagnosis not present

## 2023-01-18 DIAGNOSIS — E119 Type 2 diabetes mellitus without complications: Secondary | ICD-10-CM | POA: Diagnosis not present

## 2023-01-18 DIAGNOSIS — G629 Polyneuropathy, unspecified: Secondary | ICD-10-CM | POA: Diagnosis not present

## 2023-01-18 DIAGNOSIS — Z79899 Other long term (current) drug therapy: Secondary | ICD-10-CM | POA: Diagnosis not present

## 2023-01-18 DIAGNOSIS — Z6821 Body mass index (BMI) 21.0-21.9, adult: Secondary | ICD-10-CM | POA: Diagnosis not present

## 2023-01-18 DIAGNOSIS — E1165 Type 2 diabetes mellitus with hyperglycemia: Secondary | ICD-10-CM | POA: Diagnosis not present

## 2023-01-18 DIAGNOSIS — R030 Elevated blood-pressure reading, without diagnosis of hypertension: Secondary | ICD-10-CM | POA: Diagnosis not present

## 2023-01-30 DIAGNOSIS — E119 Type 2 diabetes mellitus without complications: Secondary | ICD-10-CM | POA: Diagnosis not present

## 2023-01-31 DIAGNOSIS — E1140 Type 2 diabetes mellitus with diabetic neuropathy, unspecified: Secondary | ICD-10-CM | POA: Diagnosis not present

## 2023-01-31 DIAGNOSIS — E119 Type 2 diabetes mellitus without complications: Secondary | ICD-10-CM | POA: Diagnosis not present

## 2023-02-06 ENCOUNTER — Ambulatory Visit
Admission: RE | Admit: 2023-02-06 | Discharge: 2023-02-06 | Disposition: A | Discharge status: Home / Self Care | Payer: Medicare Other | Source: Ambulatory Visit | Attending: Internal Medicine | Admitting: Internal Medicine

## 2023-02-06 DIAGNOSIS — Z87891 Personal history of nicotine dependence: Secondary | ICD-10-CM

## 2023-02-09 DIAGNOSIS — Z794 Long term (current) use of insulin: Secondary | ICD-10-CM | POA: Diagnosis not present

## 2023-02-09 DIAGNOSIS — I252 Old myocardial infarction: Secondary | ICD-10-CM | POA: Diagnosis not present

## 2023-02-09 DIAGNOSIS — E7800 Pure hypercholesterolemia, unspecified: Secondary | ICD-10-CM | POA: Diagnosis not present

## 2023-02-09 DIAGNOSIS — I10 Essential (primary) hypertension: Secondary | ICD-10-CM | POA: Diagnosis not present

## 2023-02-09 DIAGNOSIS — E1140 Type 2 diabetes mellitus with diabetic neuropathy, unspecified: Secondary | ICD-10-CM | POA: Diagnosis not present

## 2023-02-15 DIAGNOSIS — Z79899 Other long term (current) drug therapy: Secondary | ICD-10-CM | POA: Diagnosis not present

## 2023-02-15 DIAGNOSIS — R030 Elevated blood-pressure reading, without diagnosis of hypertension: Secondary | ICD-10-CM | POA: Diagnosis not present

## 2023-02-15 DIAGNOSIS — G629 Polyneuropathy, unspecified: Secondary | ICD-10-CM | POA: Diagnosis not present

## 2023-02-15 DIAGNOSIS — E1165 Type 2 diabetes mellitus with hyperglycemia: Secondary | ICD-10-CM | POA: Diagnosis not present

## 2023-02-15 DIAGNOSIS — Z6821 Body mass index (BMI) 21.0-21.9, adult: Secondary | ICD-10-CM | POA: Diagnosis not present

## 2023-02-15 DIAGNOSIS — E119 Type 2 diabetes mellitus without complications: Secondary | ICD-10-CM | POA: Diagnosis not present

## 2023-02-28 DIAGNOSIS — E1142 Type 2 diabetes mellitus with diabetic polyneuropathy: Secondary | ICD-10-CM | POA: Diagnosis not present

## 2023-02-28 DIAGNOSIS — L84 Corns and callosities: Secondary | ICD-10-CM | POA: Diagnosis not present

## 2023-02-28 DIAGNOSIS — M79676 Pain in unspecified toe(s): Secondary | ICD-10-CM | POA: Diagnosis not present

## 2023-02-28 DIAGNOSIS — B351 Tinea unguium: Secondary | ICD-10-CM | POA: Diagnosis not present

## 2023-03-01 DIAGNOSIS — E1140 Type 2 diabetes mellitus with diabetic neuropathy, unspecified: Secondary | ICD-10-CM | POA: Diagnosis not present

## 2023-03-01 DIAGNOSIS — E119 Type 2 diabetes mellitus without complications: Secondary | ICD-10-CM | POA: Diagnosis not present

## 2023-03-08 ENCOUNTER — Telehealth: Payer: Self-pay | Admitting: Acute Care

## 2023-03-08 NOTE — Telephone Encounter (Signed)
CT result letter sent to pt with notation to continue follow up with PCP due to Medicare age guidelines for lung cancer screening. . CT results faxed to PCP.

## 2023-03-08 NOTE — Telephone Encounter (Signed)
This guys lungs are pretty scarred. The 16.6 mm area has been monitored for several years. chronic nodular area of pleuroparenchymal thickening and architectural distortion in the apex of the right upper lobe. Stable since previous screening.  He had one  LR 4B nodule noted on his scan last year, but the overall scan was read as a LR 2.  This year that same nodule was read as a LR 2 as it is stable. His lungs are tough.  Due to his age, he can see if his PCP wants to follow his lungs annually since he no longer qualifies for screening. Thanks so much

## 2023-03-09 ENCOUNTER — Other Ambulatory Visit: Payer: Self-pay | Admitting: Cardiovascular Disease

## 2023-03-15 DIAGNOSIS — G629 Polyneuropathy, unspecified: Secondary | ICD-10-CM | POA: Diagnosis not present

## 2023-03-15 DIAGNOSIS — Z6821 Body mass index (BMI) 21.0-21.9, adult: Secondary | ICD-10-CM | POA: Diagnosis not present

## 2023-03-15 DIAGNOSIS — E119 Type 2 diabetes mellitus without complications: Secondary | ICD-10-CM | POA: Diagnosis not present

## 2023-03-15 DIAGNOSIS — Z79899 Other long term (current) drug therapy: Secondary | ICD-10-CM | POA: Diagnosis not present

## 2023-03-15 DIAGNOSIS — R030 Elevated blood-pressure reading, without diagnosis of hypertension: Secondary | ICD-10-CM | POA: Diagnosis not present

## 2023-04-01 DIAGNOSIS — E1140 Type 2 diabetes mellitus with diabetic neuropathy, unspecified: Secondary | ICD-10-CM | POA: Diagnosis not present

## 2023-04-01 DIAGNOSIS — E119 Type 2 diabetes mellitus without complications: Secondary | ICD-10-CM | POA: Diagnosis not present

## 2023-04-12 DIAGNOSIS — E7800 Pure hypercholesterolemia, unspecified: Secondary | ICD-10-CM | POA: Diagnosis not present

## 2023-04-12 DIAGNOSIS — E1140 Type 2 diabetes mellitus with diabetic neuropathy, unspecified: Secondary | ICD-10-CM | POA: Diagnosis not present

## 2023-04-12 DIAGNOSIS — I2510 Atherosclerotic heart disease of native coronary artery without angina pectoris: Secondary | ICD-10-CM | POA: Diagnosis not present

## 2023-04-13 DIAGNOSIS — G629 Polyneuropathy, unspecified: Secondary | ICD-10-CM | POA: Diagnosis not present

## 2023-04-13 DIAGNOSIS — Z6821 Body mass index (BMI) 21.0-21.9, adult: Secondary | ICD-10-CM | POA: Diagnosis not present

## 2023-04-13 DIAGNOSIS — Z79899 Other long term (current) drug therapy: Secondary | ICD-10-CM | POA: Diagnosis not present

## 2023-04-13 DIAGNOSIS — E11618 Type 2 diabetes mellitus with other diabetic arthropathy: Secondary | ICD-10-CM | POA: Diagnosis not present

## 2023-04-20 DIAGNOSIS — Z794 Long term (current) use of insulin: Secondary | ICD-10-CM | POA: Diagnosis not present

## 2023-04-20 DIAGNOSIS — E1140 Type 2 diabetes mellitus with diabetic neuropathy, unspecified: Secondary | ICD-10-CM | POA: Diagnosis not present

## 2023-04-20 DIAGNOSIS — E875 Hyperkalemia: Secondary | ICD-10-CM | POA: Diagnosis not present

## 2023-04-20 DIAGNOSIS — I252 Old myocardial infarction: Secondary | ICD-10-CM | POA: Diagnosis not present

## 2023-04-20 DIAGNOSIS — E7800 Pure hypercholesterolemia, unspecified: Secondary | ICD-10-CM | POA: Diagnosis not present

## 2023-04-20 DIAGNOSIS — I10 Essential (primary) hypertension: Secondary | ICD-10-CM | POA: Diagnosis not present

## 2023-04-28 DIAGNOSIS — E875 Hyperkalemia: Secondary | ICD-10-CM | POA: Diagnosis not present

## 2023-05-01 DIAGNOSIS — E1140 Type 2 diabetes mellitus with diabetic neuropathy, unspecified: Secondary | ICD-10-CM | POA: Diagnosis not present

## 2023-05-01 DIAGNOSIS — E119 Type 2 diabetes mellitus without complications: Secondary | ICD-10-CM | POA: Diagnosis not present

## 2023-05-15 DIAGNOSIS — E11618 Type 2 diabetes mellitus with other diabetic arthropathy: Secondary | ICD-10-CM | POA: Diagnosis not present

## 2023-05-15 DIAGNOSIS — Z79899 Other long term (current) drug therapy: Secondary | ICD-10-CM | POA: Diagnosis not present

## 2023-05-15 DIAGNOSIS — Z6821 Body mass index (BMI) 21.0-21.9, adult: Secondary | ICD-10-CM | POA: Diagnosis not present

## 2023-05-15 DIAGNOSIS — G629 Polyneuropathy, unspecified: Secondary | ICD-10-CM | POA: Diagnosis not present

## 2023-05-30 DIAGNOSIS — M79676 Pain in unspecified toe(s): Secondary | ICD-10-CM | POA: Diagnosis not present

## 2023-05-30 DIAGNOSIS — L84 Corns and callosities: Secondary | ICD-10-CM | POA: Diagnosis not present

## 2023-05-30 DIAGNOSIS — B351 Tinea unguium: Secondary | ICD-10-CM | POA: Diagnosis not present

## 2023-05-30 DIAGNOSIS — E1142 Type 2 diabetes mellitus with diabetic polyneuropathy: Secondary | ICD-10-CM | POA: Diagnosis not present

## 2023-06-01 DIAGNOSIS — E1140 Type 2 diabetes mellitus with diabetic neuropathy, unspecified: Secondary | ICD-10-CM | POA: Diagnosis not present

## 2023-06-01 DIAGNOSIS — E119 Type 2 diabetes mellitus without complications: Secondary | ICD-10-CM | POA: Diagnosis not present

## 2023-06-03 ENCOUNTER — Other Ambulatory Visit: Payer: Self-pay | Admitting: Cardiovascular Disease

## 2023-06-19 DIAGNOSIS — Z6821 Body mass index (BMI) 21.0-21.9, adult: Secondary | ICD-10-CM | POA: Diagnosis not present

## 2023-06-19 DIAGNOSIS — G629 Polyneuropathy, unspecified: Secondary | ICD-10-CM | POA: Diagnosis not present

## 2023-06-19 DIAGNOSIS — Z79899 Other long term (current) drug therapy: Secondary | ICD-10-CM | POA: Diagnosis not present

## 2023-06-19 DIAGNOSIS — E11618 Type 2 diabetes mellitus with other diabetic arthropathy: Secondary | ICD-10-CM | POA: Diagnosis not present

## 2023-06-22 ENCOUNTER — Other Ambulatory Visit: Payer: Self-pay | Admitting: Cardiovascular Disease

## 2023-06-27 ENCOUNTER — Other Ambulatory Visit: Payer: Self-pay | Admitting: Cardiovascular Disease

## 2023-07-01 DIAGNOSIS — E1140 Type 2 diabetes mellitus with diabetic neuropathy, unspecified: Secondary | ICD-10-CM | POA: Diagnosis not present

## 2023-07-01 DIAGNOSIS — E119 Type 2 diabetes mellitus without complications: Secondary | ICD-10-CM | POA: Diagnosis not present

## 2023-07-09 ENCOUNTER — Other Ambulatory Visit: Payer: Self-pay | Admitting: Cardiovascular Disease

## 2023-07-25 DIAGNOSIS — Z794 Long term (current) use of insulin: Secondary | ICD-10-CM | POA: Diagnosis not present

## 2023-07-25 DIAGNOSIS — E1140 Type 2 diabetes mellitus with diabetic neuropathy, unspecified: Secondary | ICD-10-CM | POA: Diagnosis not present

## 2023-07-25 DIAGNOSIS — I252 Old myocardial infarction: Secondary | ICD-10-CM | POA: Diagnosis not present

## 2023-07-25 DIAGNOSIS — E119 Type 2 diabetes mellitus without complications: Secondary | ICD-10-CM | POA: Diagnosis not present

## 2023-07-25 DIAGNOSIS — I10 Essential (primary) hypertension: Secondary | ICD-10-CM | POA: Diagnosis not present

## 2023-07-25 DIAGNOSIS — E7800 Pure hypercholesterolemia, unspecified: Secondary | ICD-10-CM | POA: Diagnosis not present

## 2023-07-25 DIAGNOSIS — E875 Hyperkalemia: Secondary | ICD-10-CM | POA: Diagnosis not present

## 2023-07-31 DIAGNOSIS — Z6821 Body mass index (BMI) 21.0-21.9, adult: Secondary | ICD-10-CM | POA: Diagnosis not present

## 2023-07-31 DIAGNOSIS — G629 Polyneuropathy, unspecified: Secondary | ICD-10-CM | POA: Diagnosis not present

## 2023-07-31 DIAGNOSIS — G894 Chronic pain syndrome: Secondary | ICD-10-CM | POA: Diagnosis not present

## 2023-07-31 DIAGNOSIS — Z79899 Other long term (current) drug therapy: Secondary | ICD-10-CM | POA: Diagnosis not present

## 2023-07-31 DIAGNOSIS — E11618 Type 2 diabetes mellitus with other diabetic arthropathy: Secondary | ICD-10-CM | POA: Diagnosis not present

## 2023-08-01 DIAGNOSIS — E119 Type 2 diabetes mellitus without complications: Secondary | ICD-10-CM | POA: Diagnosis not present

## 2023-08-01 DIAGNOSIS — E1140 Type 2 diabetes mellitus with diabetic neuropathy, unspecified: Secondary | ICD-10-CM | POA: Diagnosis not present

## 2023-08-08 DIAGNOSIS — M79676 Pain in unspecified toe(s): Secondary | ICD-10-CM | POA: Diagnosis not present

## 2023-08-08 DIAGNOSIS — B351 Tinea unguium: Secondary | ICD-10-CM | POA: Diagnosis not present

## 2023-08-08 DIAGNOSIS — L84 Corns and callosities: Secondary | ICD-10-CM | POA: Diagnosis not present

## 2023-08-08 DIAGNOSIS — E1142 Type 2 diabetes mellitus with diabetic polyneuropathy: Secondary | ICD-10-CM | POA: Diagnosis not present

## 2023-08-14 DIAGNOSIS — E1140 Type 2 diabetes mellitus with diabetic neuropathy, unspecified: Secondary | ICD-10-CM | POA: Diagnosis not present

## 2023-08-26 DIAGNOSIS — Z79899 Other long term (current) drug therapy: Secondary | ICD-10-CM | POA: Diagnosis not present

## 2023-08-26 DIAGNOSIS — Z9181 History of falling: Secondary | ICD-10-CM | POA: Diagnosis not present

## 2023-08-26 DIAGNOSIS — E11618 Type 2 diabetes mellitus with other diabetic arthropathy: Secondary | ICD-10-CM | POA: Diagnosis not present

## 2023-08-26 DIAGNOSIS — Z6821 Body mass index (BMI) 21.0-21.9, adult: Secondary | ICD-10-CM | POA: Diagnosis not present

## 2023-08-26 DIAGNOSIS — G629 Polyneuropathy, unspecified: Secondary | ICD-10-CM | POA: Diagnosis not present

## 2023-08-29 DIAGNOSIS — E1140 Type 2 diabetes mellitus with diabetic neuropathy, unspecified: Secondary | ICD-10-CM | POA: Diagnosis not present

## 2023-08-29 DIAGNOSIS — I10 Essential (primary) hypertension: Secondary | ICD-10-CM | POA: Diagnosis not present

## 2023-08-29 DIAGNOSIS — Z794 Long term (current) use of insulin: Secondary | ICD-10-CM | POA: Diagnosis not present

## 2023-08-29 DIAGNOSIS — E7800 Pure hypercholesterolemia, unspecified: Secondary | ICD-10-CM | POA: Diagnosis not present

## 2023-08-29 DIAGNOSIS — I252 Old myocardial infarction: Secondary | ICD-10-CM | POA: Diagnosis not present

## 2023-08-31 DIAGNOSIS — Z79899 Other long term (current) drug therapy: Secondary | ICD-10-CM | POA: Diagnosis not present

## 2023-09-07 DIAGNOSIS — E1140 Type 2 diabetes mellitus with diabetic neuropathy, unspecified: Secondary | ICD-10-CM | POA: Diagnosis not present

## 2023-09-23 DIAGNOSIS — Z0189 Encounter for other specified special examinations: Secondary | ICD-10-CM | POA: Diagnosis not present

## 2023-09-23 DIAGNOSIS — Z0130 Encounter for examination of blood pressure without abnormal findings: Secondary | ICD-10-CM | POA: Diagnosis not present

## 2023-09-23 DIAGNOSIS — G629 Polyneuropathy, unspecified: Secondary | ICD-10-CM | POA: Diagnosis not present

## 2023-09-23 DIAGNOSIS — Z9181 History of falling: Secondary | ICD-10-CM | POA: Diagnosis not present

## 2023-09-23 DIAGNOSIS — Z6821 Body mass index (BMI) 21.0-21.9, adult: Secondary | ICD-10-CM | POA: Diagnosis not present

## 2023-09-23 DIAGNOSIS — Z79899 Other long term (current) drug therapy: Secondary | ICD-10-CM | POA: Diagnosis not present

## 2023-09-23 DIAGNOSIS — E119 Type 2 diabetes mellitus without complications: Secondary | ICD-10-CM | POA: Diagnosis not present

## 2023-10-10 DIAGNOSIS — L84 Corns and callosities: Secondary | ICD-10-CM | POA: Diagnosis not present

## 2023-10-10 DIAGNOSIS — B351 Tinea unguium: Secondary | ICD-10-CM | POA: Diagnosis not present

## 2023-10-10 DIAGNOSIS — M79676 Pain in unspecified toe(s): Secondary | ICD-10-CM | POA: Diagnosis not present

## 2023-10-10 DIAGNOSIS — E1142 Type 2 diabetes mellitus with diabetic polyneuropathy: Secondary | ICD-10-CM | POA: Diagnosis not present

## 2023-10-13 ENCOUNTER — Other Ambulatory Visit: Payer: Self-pay

## 2023-10-13 ENCOUNTER — Emergency Department (HOSPITAL_COMMUNITY): Payer: Medicare Other

## 2023-10-13 ENCOUNTER — Telehealth: Payer: Self-pay | Admitting: Cardiovascular Disease

## 2023-10-13 ENCOUNTER — Encounter (HOSPITAL_COMMUNITY): Payer: Self-pay | Admitting: Emergency Medicine

## 2023-10-13 ENCOUNTER — Emergency Department (HOSPITAL_COMMUNITY)
Admission: EM | Admit: 2023-10-13 | Discharge: 2023-10-13 | Disposition: A | Discharge status: Home / Self Care | Payer: Medicare Other | Attending: Emergency Medicine | Admitting: Emergency Medicine

## 2023-10-13 ENCOUNTER — Encounter: Payer: Self-pay | Admitting: *Deleted

## 2023-10-13 DIAGNOSIS — Z7982 Long term (current) use of aspirin: Secondary | ICD-10-CM | POA: Insufficient documentation

## 2023-10-13 DIAGNOSIS — I2510 Atherosclerotic heart disease of native coronary artery without angina pectoris: Secondary | ICD-10-CM | POA: Insufficient documentation

## 2023-10-13 DIAGNOSIS — N200 Calculus of kidney: Secondary | ICD-10-CM | POA: Diagnosis not present

## 2023-10-13 DIAGNOSIS — R6889 Other general symptoms and signs: Secondary | ICD-10-CM | POA: Diagnosis not present

## 2023-10-13 DIAGNOSIS — R1011 Right upper quadrant pain: Secondary | ICD-10-CM | POA: Diagnosis not present

## 2023-10-13 DIAGNOSIS — I10 Essential (primary) hypertension: Secondary | ICD-10-CM | POA: Insufficient documentation

## 2023-10-13 DIAGNOSIS — Z7901 Long term (current) use of anticoagulants: Secondary | ICD-10-CM | POA: Diagnosis not present

## 2023-10-13 DIAGNOSIS — K8050 Calculus of bile duct without cholangitis or cholecystitis without obstruction: Secondary | ICD-10-CM | POA: Diagnosis not present

## 2023-10-13 DIAGNOSIS — Z951 Presence of aortocoronary bypass graft: Secondary | ICD-10-CM | POA: Insufficient documentation

## 2023-10-13 DIAGNOSIS — R1111 Vomiting without nausea: Secondary | ICD-10-CM | POA: Diagnosis not present

## 2023-10-13 DIAGNOSIS — K8020 Calculus of gallbladder without cholecystitis without obstruction: Secondary | ICD-10-CM | POA: Diagnosis not present

## 2023-10-13 DIAGNOSIS — R112 Nausea with vomiting, unspecified: Secondary | ICD-10-CM | POA: Diagnosis not present

## 2023-10-13 DIAGNOSIS — Z743 Need for continuous supervision: Secondary | ICD-10-CM | POA: Diagnosis not present

## 2023-10-13 DIAGNOSIS — R109 Unspecified abdominal pain: Secondary | ICD-10-CM | POA: Diagnosis not present

## 2023-10-13 LAB — COMPREHENSIVE METABOLIC PANEL
ALT: 79 U/L — ABNORMAL HIGH (ref 0–44)
AST: 171 U/L — ABNORMAL HIGH (ref 15–41)
Albumin: 4.3 g/dL (ref 3.5–5.0)
Alkaline Phosphatase: 70 U/L (ref 38–126)
Anion gap: 12 (ref 5–15)
BUN: 24 mg/dL — ABNORMAL HIGH (ref 8–23)
CO2: 23 mmol/L (ref 22–32)
Calcium: 9 mg/dL (ref 8.9–10.3)
Chloride: 100 mmol/L (ref 98–111)
Creatinine, Ser: 1.62 mg/dL — ABNORMAL HIGH (ref 0.61–1.24)
GFR, Estimated: 43 mL/min — ABNORMAL LOW (ref >60)
Glucose, Bld: 335 mg/dL — ABNORMAL HIGH (ref 70–99)
Potassium: 5.1 mmol/L (ref 3.5–5.1)
Sodium: 135 mmol/L (ref 135–145)
Total Bilirubin: 2.4 mg/dL — ABNORMAL HIGH (ref <1.2)
Total Protein: 7 g/dL (ref 6.5–8.1)

## 2023-10-13 LAB — CBC WITH DIFFERENTIAL/PLATELET
Abs Immature Granulocytes: 0.04 10*3/uL (ref 0.00–0.07)
Basophils Absolute: 0 10*3/uL (ref 0.0–0.1)
Basophils Relative: 0 %
Eosinophils Absolute: 0 10*3/uL (ref 0.0–0.5)
Eosinophils Relative: 0 %
HCT: 37.8 % — ABNORMAL LOW (ref 39.0–52.0)
Hemoglobin: 12.1 g/dL — ABNORMAL LOW (ref 13.0–17.0)
Immature Granulocytes: 0 %
Lymphocytes Relative: 5 %
Lymphs Abs: 0.5 10*3/uL — ABNORMAL LOW (ref 0.7–4.0)
MCH: 27.4 pg (ref 26.0–34.0)
MCHC: 32 g/dL (ref 30.0–36.0)
MCV: 85.5 fL (ref 80.0–100.0)
Monocytes Absolute: 0.3 10*3/uL (ref 0.1–1.0)
Monocytes Relative: 3 %
Neutro Abs: 9.5 10*3/uL — ABNORMAL HIGH (ref 1.7–7.7)
Neutrophils Relative %: 92 %
Platelets: 222 10*3/uL (ref 150–400)
RBC: 4.42 MIL/uL (ref 4.22–5.81)
RDW: 16.8 % — ABNORMAL HIGH (ref 11.5–15.5)
WBC: 10.5 10*3/uL (ref 4.0–10.5)
nRBC: 0 % (ref 0.0–0.2)

## 2023-10-13 LAB — TROPONIN I (HIGH SENSITIVITY)
Troponin I (High Sensitivity): 5 ng/L (ref <18)
Troponin I (High Sensitivity): 5 ng/L (ref <18)

## 2023-10-13 LAB — URINALYSIS, ROUTINE W REFLEX MICROSCOPIC
Bacteria, UA: NONE SEEN
Bilirubin Urine: NEGATIVE
Glucose, UA: >=500 mg/dL — AB
Hgb urine dipstick: NEGATIVE
Ketones, ur: 20 mg/dL — AB
Leukocytes,Ua: NEGATIVE
Nitrite: NEGATIVE
Protein, ur: NEGATIVE mg/dL
Specific Gravity, Urine: 1.022 (ref 1.005–1.030)
pH: 6 (ref 5.0–8.0)

## 2023-10-13 LAB — LIPASE, BLOOD: Lipase: 23 U/L (ref 11–51)

## 2023-10-13 MED ORDER — METRONIDAZOLE 500 MG PO TABS: 500.0000 mg | ORAL_TABLET | Freq: Two times a day (BID) | ORAL | 0 refills | Status: DC

## 2023-10-13 MED ORDER — CEFADROXIL 500 MG PO CAPS: 500.0000 mg | ORAL_CAPSULE | Freq: Two times a day (BID) | ORAL | 0 refills | Status: DC

## 2023-10-13 MED ORDER — PANTOPRAZOLE SODIUM 40 MG PO TBEC
40.0000 mg | DELAYED_RELEASE_TABLET | Freq: Once | ORAL | Status: AC
  Administered 2023-10-13: 40 mg via ORAL
  Filled 2023-10-13: qty 1

## 2023-10-13 MED ORDER — SODIUM CHLORIDE 0.9 % IV SOLN
2.0000 g | Freq: Once | INTRAVENOUS | Status: AC
Start: 2023-10-13 — End: 2023-10-13
  Administered 2023-10-13: 2 g via INTRAVENOUS
  Filled 2023-10-13: qty 20

## 2023-10-13 MED ORDER — METRONIDAZOLE 500 MG/100ML IV SOLN
500.0000 mg | Freq: Once | INTRAVENOUS | Status: AC
  Administered 2023-10-13: 500 mg via INTRAVENOUS
  Filled 2023-10-13: qty 100

## 2023-10-13 NOTE — Telephone Encounter (Signed)
   Pre-operative Risk Assessment    Patient Name: Derrick [name]  DOB: 01-24-1946 MRN: [id number]      Request for Surgical Clearance    Procedure:   Cholecystectomy  Date of Surgery:  Clearance 10/20/23                                 Surgeon:   Dr. Theophilus Kinds Surgeon's Group or Practice Name:  [facility] Phone number:  [phone]  Fax number:  [phone]   Type of Clearance Requested:   - Medical  - Pharmacy:  Hold Clopidogrel (Plavix)     Type of Anesthesia:  General    Additional requests/questions:   Caller Trula Ore) stated they will need to hold patient's Plavix from 11/11.  Signed, Annetta Maw   10/13/2023, 9:05 AM

## 2023-10-13 NOTE — ED Notes (Signed)
Pt finished a BEC biscuit. Will follow up soon to evaluate pain.

## 2023-10-13 NOTE — Telephone Encounter (Signed)
   Name: KINO DINGELDEIN  DOB: Nov 16, 1946  MRN: [id number]  Primary Cardiologist: None  Chart reviewed as part of pre-operative protocol coverage. The patient has an upcoming visit scheduled with Robet Leu, PA on 10/16/2023 at which time clearance can be addressed in case there are any issues that would impact surgical recommendations.   I added preop FYI to appointment note so that provider is aware to address at time of outpatient visit.  Per office protocol the cardiology provider should forward their finalized clearance decision and recommendations regarding antiplatelet therapy to the requesting party below.    I will route this message as FYI to requesting party and remove this message from the preop box as separate preop APP input not needed at this time.   Please call with any questions.  Napoleon Form, Leodis Rains, NP  10/13/2023, 9:26 AM

## 2023-10-13 NOTE — ED Provider Notes (Signed)
Derrick [name] Provider Note   CSN: [id number] Arrival date & time: 10/13/23  0458     History  Chief Complaint  Patient presents with   Abdominal Pain    Derrick [name] is a 77 y.o. male.  Patient is a 77 year old male with past medical history of coronary artery disease with CABG in the past, hyperlipidemia, mitral regurg, hypertension, GERD.  Patient presenting today for evaluation of right-sided abdominal pain.  He was putting together a puzzle when he developed the sudden onset of pain to his right upper quadrant and side radiating into his lower abdomen.  This began around midnight, lasted for several hours, then called 911 and was transported here.  Patient was given medication for nausea in the ambulance and pain has now nearly resolved.  No fevers or chills.  He denies any bowel or bladder complaints.  The history is provided by the patient.       Home Medications Prior to Admission medications   Medication Sig Start Date End Date Taking? Authorizing Provider  aspirin EC 81 MG EC tablet Take 1 tablet (81 mg total) by mouth daily. 08/23/16   Leone Brand, NP  Blood Glucose Monitoring Suppl (ONE TOUCH ULTRA SYSTEM KIT) W/DEVICE KIT Patient must check sugar TID 01/01/14   McGowen, Maryjean Morn, MD  clopidogrel (PLAVIX) 75 MG tablet TAKE 1 TABLET BY MOUTH ONCE DAILY . APPOINTMENT REQUIRED FOR FUTURE REFILLS 07/10/23   Runell Gess, MD  ezetimibe (ZETIA) 10 MG tablet Take 1 tablet by mouth daily. 08/06/20   [provider]  glucose blood (ONE TOUCH ULTRA TEST) test strip USE ONE STRIP TO CHECK GLUCOSE 4 TIMES DAILY AS NEEDED 11/17/15   McGowen, Maryjean Morn, MD  HYDROcodone-acetaminophen (NORCO/VICODIN) 5-325 MG tablet Take 1 tablet by mouth every 6 (six) hours as needed for moderate pain. Patient taking differently: Take 1 tablet by mouth daily as needed for moderate pain. 05/04/16   Alleen Borne, MD  insulin lispro (HUMALOG) 100  UNIT/ML injection up to 100 units via insulin pump 08/26/20   [provider]  lisinopril (PRINIVIL,ZESTRIL) 2.5 MG tablet Take 2.5 mg by mouth daily.    [provider]  metoprolol tartrate (LOPRESSOR) 25 MG tablet Take 0.5 tablets (12.5 mg total) by mouth 2 (two) times daily. 04/27/16   Barrett, Erin R, PA-C  ONETOUCH DELICA LANCETS 33G MISC USE ONE  4 TIMES DAILY AS NEEDED 10/05/15   McGowen, Maryjean Morn, MD  pantoprazole (PROTONIX) 40 MG tablet Take 1 tablet by mouth once daily 06/22/23   Runell Gess, MD  REPATHA SURECLICK 140 MG/ML SOAJ SMARTSIG:140 Milligram(s) SUB-Q Every 2 Weeks 02/14/22   [provider]  research study medication Take 1 each by mouth daily. Study drug for cholesterol from Dr. Merri Brunette    [provider]      Allergies    Atorvastatin, Lovastatin, Pravastatin, and Simvastatin    Review of Systems   Review of Systems  All other systems reviewed and are negative.   Physical Exam Updated Vital Signs Pulse 74   Temp 98.6 F (37 C) (Oral)   Resp 16   Ht 5\' 6"  (1.676 m)   Wt 62 kg   SpO2 96%   BMI 22.06 kg/m  Physical Exam Vitals and nursing note reviewed.  Constitutional:      General: He is not in acute distress.    Appearance: He is well-developed. He is not diaphoretic.  HENT:     Head: Normocephalic and atraumatic.  Cardiovascular:     Rate and Rhythm: Normal rate and regular rhythm.     Heart sounds: No murmur heard.    No friction rub.  Pulmonary:     Effort: Pulmonary effort is normal. No respiratory distress.     Breath sounds: Normal breath sounds. No wheezing or rales.  Abdominal:     General: Bowel sounds are normal. There is no distension.     Palpations: Abdomen is soft.     Tenderness: There is abdominal tenderness in the right upper quadrant and right lower quadrant. There is no right CVA tenderness, left CVA tenderness, guarding or rebound.  Musculoskeletal:        General: Normal range of  motion.     Cervical back: Normal range of motion and neck supple.  Skin:    General: Skin is warm and dry.  Neurological:     Mental Status: He is alert and oriented to person, place, and time.     Coordination: Coordination normal.     ED Results / Procedures / Treatments   Labs (all labs ordered are listed, but only abnormal results are displayed) Labs Reviewed  COMPREHENSIVE METABOLIC PANEL  CBC WITH DIFFERENTIAL/PLATELET  LIPASE, BLOOD  URINALYSIS, ROUTINE W REFLEX MICROSCOPIC    EKG None  Radiology No results found.  Procedures Procedures    Medications Ordered in ED Medications - No data to display  ED Course/ Medical Decision Making/ A&P  Patient is a 77 year old male with history of coronary artery disease/CABG presenting with complaints of right upper quadrant pain that started suddenly at approximately midnight.  Pain is sharp in nature and seem to resolve after EMS gave him Zofran.  Patient arrives here with stable vital signs and is afebrile.  There is no hypoxia.  He is currently not experiencing the discomfort that brought him here.  Workup initiated including CBC, CMP, lipase, and troponin.  There is no leukocytosis.  CMP does reveal mild elevations of the transaminases and total bili, but is otherwise unremarkable.  Urinalysis is clear.  CT scan with renal protocol obtained showing no evidence for renal calculus, but does show subtle ill-definition of the gallbladder without definite thickening.  Right upper quadrant ultrasound has been recommended and will be obtained.  Care will be signed out to oncoming provider at shift change to obtain the results of the ultrasound and pending troponin and determine the final disposition.  Final Clinical Impression(s) / ED Diagnoses Final diagnoses:  None    Rx / DC Orders ED Discharge Orders     None         Derrick Lyons, MD 10/13/23 [phone]

## 2023-10-13 NOTE — ED Provider Notes (Signed)
  Physical Exam  BP (!) 150/57   Pulse 79   Temp 98.6 F (37 C) (Oral)   Resp 16   Ht 5\' 6"  (1.676 m)   Wt 62 kg   SpO2 94%   BMI 22.06 kg/m   Physical Exam Constitutional:      General: He is not in acute distress.    Appearance: Normal appearance.  HENT:     Head: Normocephalic and atraumatic.     Nose: No congestion or rhinorrhea.  Eyes:     General:        Right eye: No discharge.        Left eye: No discharge.     Extraocular Movements: Extraocular movements intact.     Pupils: Pupils are equal, round, and reactive to light.  Cardiovascular:     Rate and Rhythm: Normal rate and regular rhythm.     Heart sounds: No murmur heard. Pulmonary:     Effort: No respiratory distress.     Breath sounds: No wheezing or rales.  Abdominal:     General: There is no distension.     Tenderness: There is no abdominal tenderness.  Musculoskeletal:        General: Normal range of motion.     Cervical back: Normal range of motion.  Skin:    General: Skin is warm and dry.  Neurological:     General: No focal deficit present.     Mental Status: He is alert.     Procedures  Procedures  ED Course / MDM    Medical Decision Making Amount and/or Complexity of Data Reviewed Labs: ordered. Radiology: ordered.  Risk Prescription drug management.   Patient received in handoff.  Right upper quadrant abdominal pain with CT concerning for possible biliary pathology.  Pending ultrasound at time of signout.  Right upper quadrant ultrasound showing cholelithiasis with mild circumferential gallbladder wall thickening and trace pericholecystic fluid.  He has no Murphy sign on exam and no leukocytosis, no fever, tachycardia and is completely pain-free on my evaluation.  I did speak with the general surgeon on-call Dr. Robyne Peers who is recommending antibiotics, very close outpatient follow-up and he will likely have a cholecystectomy on Friday of next week.  I did contact patient's primary  cardiologist Dr. Gery Pray to help expedite cardiac clearance and general surgery will follow-up with the patient in 4 days.  Patient given single dose of ceftriaxone Flagyl and will place the patient on Duricef Flagyl in the meantime.  Encouraged low-fat diet.  We did do targeted p.o. challenge with fatty foods in the ER today and patient ate a [employer] breakfast sandwich with no return of symptoms.  Suspect patient will do well at home but gave patient very strict return precautions which he voiced understanding.  Patient struck to to stop taking his Plavix on Monday to ensure that he will have a safe procedure 5 days after.  Patient then discharged       Glendora Score, MD 10/13/23 1104

## 2023-10-13 NOTE — ED Triage Notes (Addendum)
Pt to ed via [facility] c/o sudden RUQ pain just below the ribs with nausea/emesis. Pain 10/10. Per ems, a mass felt on right abdominal side. EMS gave 4mg  of zofran. Pt appears distressed

## 2023-10-14 NOTE — Progress Notes (Unsigned)
Cardiology Office Note:  .   Date:  10/14/2023  ID:  Derrick [name], DOB 1946-08-15, MRN [id number] PCP: Derrick Brunette, MD  [facility] Providers Cardiologist:  Dr. Allyson Sabal   History of Present Illness: .   Derrick [name] is a 77 y.o. male with a past medical history of CAD s/p CABG, HLD, statin intolerance, history of tobacco use, carotid artery disease, PVD. Patient is followed by Dr. Allyson Sabal and presents today for preoperative evaluation.   Per chart review, patient had an MI in 2010 and was treated with stenting.  Underwent left heart catheterization on 03/28/16 that showed patency of proximal LAD stent.  There was 70% stenosis in proximal-mid RCA, 70% stenosis in proximal-mid circumflex, 95% stenosis in ostial circumflex.  He underwent CABG on 04/22/16 LIMA-OM1, SVG-OM 2, and free RIMA-RCA.  Later in 08/2016, patient had stenting of the left common iliac artery.  In 10/2016, attempted right common iliac artery CTO PTA, but procedure was unsuccessful.  Underwent left to right femorofemoral crossover grafting in 12/2016.  Most recent carotid Dopplers from 03/2022 showed 60-79% stenosis in right ICA, 1-39% stenosis in left ICA.  Ultrasound aorta/IVC/iliacs on 03/24/2022 showed patent left proximal-proximal/mid common iliac artery stent with 50-99% in-stent restenosis, narrowing of the proximal anastomosis of his left right femorofemoral crossover graft.  Velocities essentially stable when compared to prior exam.  ABIs from 03/2022 indicated moderate right lower extremity arterial disease and moderate left lower extremity arterial disease.  Patient was last seen by Dr. Allyson Sabal in 02/2022.  At that time, patient denied chest pain, shortness of breath, claudication.  Today, patient presents for preoperative evaluation prior to lap cholecystomy on 11/15. The patient also has a history of heart disease, with a heart attack in 2010 and bypass surgery in 2017. He denies any recent chest pain or breathing  difficulties. The patient remains active, able to work for half a day or longer, and reports no pain in the legs with exertion.  The patient also reports a history of heartburn, which has been bothersome recently. The heartburn is described as being in the throat, occurring after eating, and is not associated with exertion. The patient denies any chest pain with exertion.  The patient also has a history of peripheral vascular disease and has had stents placed in the past. He denies any recent pain in the legs with exertion. The patient also reports a problem with his hip, which limits his ability to walk one to two blocks on level ground, but this is not associated with breathing difficulties or chest pain.  The patient is on aspirin and Plavix, which he has been instructed to hold before his upcoming surgery. He also takes pantoprazole for heartburn.  ROS: Patient denies chest pain, palpitations, shortness of breath, claudication, syncope, near syncope, dizziness, ankle edema.   Studies Reviewed: .   Cardiac Studies & Procedures   CARDIAC CATHETERIZATION  CARDIAC CATHETERIZATION 03/28/2016  Narrative Images from the original result were not included.  Prox RCA to Mid RCA lesion, 70% stenosed.  Ost LAD to Prox LAD lesion, 10% stenosed. The lesion was previously treated with a stent (unknown type).  Prox Cx to Mid Cx lesion, 70% stenosed.  Ost Cx lesion, 95% stenosed.  Derrick [name] is a 77 y.o. male   [id number] LOCATION:  FACILITY: [facility] PHYSICIAN: Nanetta Batty, M.D. Apr 13, 1946   DATE OF PROCEDURE:  03/28/2016  DATE OF DISCHARGE:     CARDIAC CATHETERIZATION    History obtained from  chart review. Derrick [name] is a very pleasant 77 year old thin and fit-appearing married Caucasian male father of 3, grandfather to 3 grandchildren who is accompanied by his wife Steward Drone. He was referred by Dr. Renne Crigler for cardiovascular evaluation because of a prior history of coronary  stenting. Factors include treated diabetes and hyperlipidemia intolerant to statin therapy. He smoked 45 pack years and quit at the time of his myocardial infarction in 2010. He does have a family history of heart disease with a father and brother both of whom had coronary artery bypass grafting. He'll myocardial infarction back in 2010 and had stenting performed by Dr. Juanda Chance. He does get occasional atypical chest pain every other month. There also is a question of moderate mitral regurgitation. A 2-D echocardiogram showed normal LV function with mild MR. The Myoview stress test which was read as intermediate risk with inferior and lateral ischemia. Based on this, the patient was referred for cardiac catheterization to define his anatomy. It should also be noted the patient has been having right hip pain thought to be arthritic for many years.     IMPRESSION:Mr. Nagle has a patent proximal LAD stent. He is a 95% true ostial circumflex stenosis with a 70% AV groove stenosis and a long proximal and mid segmental 70% RCA stenosis The catheter didn't damp when the RCA ostium was engaged. It addition, he has a proximal total right common iliac artery occlusion reconstituting in the right common femoral artery and a 60% proximal left common iliac artery stenosis with a 60 mm pullback gradient using a 5 [place] internal catheter after administering administering 200 mg of intra-arterial nitroglycerin via the SideArm sheath. This is physiologically significant. I believe his coronary disease is best treated with coronary artery bypass grafting given the ostial nature of his circumflex stenosis. His symptoms are fairly infrequent. I'm going to discharge him home as outpatient and will have him evaluated by the [facility] for consideration of CABG. I will address his peripheral arterial disease in a staged fashion after his coronary arteries are revascularized. This was removed and pressure held on the groin to achieve  hemostasis. The patient left the lab in stable condition.  Nanetta Batty. MD, [facility] 03/28/2016 8:56 AM  Findings Coronary Findings Diagnostic  Dominance: Right  Left Anterior Descending The lesion was previously treated with a stent (unknown type).  Left Circumflex  Right Coronary Artery  Intervention  No interventions have been documented.   STRESS TESTS  MYOCARDIAL PERFUSION IMAGING 02/23/2016  Narrative  Nuclear stress EF: 52%.  The left ventricular ejection fraction is mildly decreased (45-54%).  ST segment depression was noted during stress, beginning at 4 minutes of stress, and returning to baseline after 5-9 minutes of recovery.  Defect 1: There is a medium defect of moderate severity present in the basal inferolateral, basal anterolateral and mid inferolateral location.  This is a high risk study.  Findings consistent with ischemia.  There are significant ST depressions during exercise that persist 8 minutes into recovery as well as ST elevation in the aVR suggestive of multivessel disease. Perfusion images show medium size, moderate severity ischemia in the LCx territory (SDS =5).   ECHOCARDIOGRAM  ECHOCARDIOGRAM COMPLETE 02/23/2016  Narrative *Cardiovascular Imaging at [facility] [address] [place], [place] [address] [phone]  ------------------------------------------------------------------- Transthoracic Echocardiography  Patient:    Derrick [name], Derrick [name] MR #:       [id number] Study Date: 02/23/2016 Gender:     M Age:  69 Height:     167.6 cm Weight:     64 kg BSA:        1.73 m^2 Pt. Status: Room:  ATTENDING    Nicki Guadalajara, M.D. ORDERING     Nanetta Batty, MD PERFORMING   [facility], Outpatient SONOGRAPHER  Raelyn Number, Vascular Sonographer  cc:  ------------------------------------------------------------------- LV EF: 60% -    65%  ------------------------------------------------------------------- History:   PMH:   Chest pain.  Coronary artery disease.  Mitral valve disease.  Chronic obstructive pulmonary disease.  Risk factors:  Former tobacco use. Hypertension. Diabetes mellitus. Dyslipidemia.  ------------------------------------------------------------------- Study Conclusions  - Left ventricle: The cavity size was normal. Wall thickness was increased in a pattern of mild LVH. Systolic function was normal. The estimated ejection fraction was in the range of 60% to 65%. Wall motion was normal; there were no regional wall motion abnormalities. - Aortic valve: Valve area (Vmax): 2.32 cm^2. - Mitral valve: There was mild regurgitation.  Transthoracic echocardiography.  M-mode, complete 2D, spectral Doppler, and color Doppler.  Birthdate:  Patient birthdate: 17-Mar-1946.  Age:  Patient is 77 yr old.  Sex:  Gender: male. BMI: 22.8 kg/m^2.  Blood pressure:     106/72  Patient status: Outpatient.  Study date:  Study date: 02/23/2016. Study time: 10:43 AM.  -------------------------------------------------------------------  ------------------------------------------------------------------- Left ventricle:  The cavity size was normal. Wall thickness was increased in a pattern of mild LVH. Systolic function was normal. The estimated ejection fraction was in the range of 60% to 65%. Wall motion was normal; there were no regional wall motion abnormalities.  ------------------------------------------------------------------- Aortic valve:   Structurally normal valve.   Cusp separation was normal.  Doppler:  Transvalvular velocity was within the normal range. There was no stenosis. There was no regurgitation.    Peak velocity ratio of LVOT to aortic valve: 0.91. Valve area (Vmax): 2.32 cm^2. Indexed valve area (Vmax): 1.34 cm^2/m^2.    Peak gradient (S): 5 mm  Hg.  ------------------------------------------------------------------- Aorta:  Aortic root: The aortic root was normal in size. Ascending aorta: The ascending aorta was normal in size.  ------------------------------------------------------------------- Mitral valve:   Structurally normal valve.   Leaflet separation was normal.  Doppler:  Transvalvular velocity was within the normal range. There was no evidence for stenosis. There was mild regurgitation.    Peak gradient (D): 4 mm Hg.  ------------------------------------------------------------------- Left atrium:  The atrium was normal in size.  ------------------------------------------------------------------- Right ventricle:  The cavity size was normal. Systolic function was normal.  ------------------------------------------------------------------- Pulmonic valve:    The valve appears to be grossly normal. Doppler:  There was no significant regurgitation.  ------------------------------------------------------------------- Tricuspid valve:   Structurally normal valve.   Leaflet separation was normal.  Doppler:  Transvalvular velocity was within the normal range. There was no regurgitation.  ------------------------------------------------------------------- Right atrium:  The atrium was normal in size.  ------------------------------------------------------------------- Pericardium:  There was no pericardial effusion.  ------------------------------------------------------------------- Systemic veins: Inferior vena cava: The vessel was normal in size. The respirophasic diameter changes were in the normal range (>= 50%), consistent with normal central venous pressure.  ------------------------------------------------------------------- Measurements  Left ventricle                            Value          Reference LV ID, ED, PLAX chordal           (L)     41    mm  43 - 52 LV ID, ES, PLAX chordal            (L)     22.8  mm       23 - 38 LV fx shortening, PLAX chordal            44    %        >=29 LV PW thickness, ED                       10.8  mm       --------- IVS/LV PW ratio, ED                       1.2            <=1.3 Stroke volume, 2D                         53    ml       --------- Stroke volume/bsa, 2D                     31    ml/m^2   --------- LV end-diastolic volume, 1-p A4C          88    ml       --------- LV ejection fraction, 1-p A4C             50    %        --------- LV end-diastolic volume/bsa, 1-p          51    ml/m^2   --------- A4C LV e&', lateral                            9.65  cm/s     --------- LV E/e&', lateral                          9.83           --------- LV s&', lateral                            13    cm/s     --------- LV e&', medial                             10.1  cm/s     --------- LV E/e&', medial                           9.4            --------- LV e&', average                            9.88  cm/s     --------- LV E/e&', average                          9.61           ---------  Ventricular septum                        Value  Reference IVS thickness, ED                         13    mm       ---------  LVOT                                      Value          Reference LVOT ID, S                                18    mm       --------- LVOT area                                 2.54  cm^2     --------- LVOT peak velocity, S                     107   cm/s     --------- LVOT mean velocity, S                     73.5  cm/s     --------- LVOT VTI, S                               20.8  cm       ---------  Aortic valve                              Value          Reference Aortic valve peak velocity, S             117   cm/s     --------- Aortic peak gradient, S                   5     mm Hg    --------- Velocity ratio, peak, LVOT/AV             0.91           --------- Aortic valve area, peak velocity          2.32  cm^2      --------- Aortic valve area/bsa, peak               1.34  cm^2/m^2 --------- velocity  Aorta                                     Value          Reference Aortic root ID, ED                        34    mm       ---------  Left atrium                               Value          Reference LA ID, A-P, ES  34    mm       --------- LA ID/bsa, A-P                            1.97  cm/m^2   <=2.2 LA volume, ES, 1-p A4C                    26    ml       --------- LA volume/bsa, ES, 1-p A4C                15    ml/m^2   ---------  Mitral valve                              Value          Reference Mitral E-wave peak velocity               94.9  cm/s     --------- Mitral A-wave peak velocity               64.2  cm/s     --------- Mitral deceleration time                  222   ms       150 - 230 Mitral peak gradient, D                   4     mm Hg    --------- Mitral E/A ratio, peak                    1.4            --------- Mitral maximal regurg velocity,           434   cm/s     --------- PISA  Tricuspid valve                           Value          Reference Tricuspid regurg peak velocity            225   cm/s     --------- Tricuspid peak RV-RA gradient             20    mm Hg    ---------  Right ventricle                           Value          Reference RV ID, ED, PLAX                           20.3  mm       19 - 38  Pulmonic valve                            Value          Reference Pulmonic valve peak velocity, S           82.1  cm/s     ---------  Legend: (L)  and  (H)  mark values outside specified reference range.  ------------------------------------------------------------------- Prepared and Electronically Authenticated by  Kristeen Miss, M.D. 2017-03-21T13:54:24   TEE  ECHO  INTRAOPERATIVE TEE 04/22/2016  Narrative *[place]* *[facility]* [address] [place], [place]  [address] [phone]  ------------------------------------------------------------------- Intraoperative Transesophageal Echocardiography  Patient:    Derrick [name], Derrick [name] MR #:       [id number] Study Date: 04/22/2016 Gender:     M Age:        69 Height: Weight: BSA: Pt. Status: Room:       [id number]  ADMITTING    Evelene Croon, MD ATTENDING    Evelene Croon, MD SONOGRAPHER  Arvil Chaco ORDERING     Corky Sox, M.D. PERFORMING   Corky Sox, M.D. REFERRING    Val Eagle  cc:  ------------------------------------------------------------------- LV EF: 50% -  ------------------------------------------------------------------- Indications:     CABG by-pass RWMA  ------------------------------------------------------------------- Study Conclusions  - Left ventricle: The cavity size was normal. Wall thickness was normal. Systolic function was at the lower limits of normal. The estimated ejection fraction was = 50%. Wall motion was normal; there were no regional wall motion abnormalities. - Aortic valve: Normal-sized, mildly calcified annulus. Trileaflet; normal thickness leaflets. There was trivial regurgitation directed centrally in the LVOT. - Mitral valve: There was mild regurgitation directed centrally. Valve area by continuity equation (using LVOT flow): 3.07 cm^2. - Left atrium: No evidence of thrombus in the atrial cavity or appendage. - Atrial septum: No defect or patent foramen ovale was identified.  Intraoperative transesophageal echocardiography.  Birthdate: Patient birthdate: 01/08/1946.  Age:  Patient is 77 yr old.  Sex: Gender: male.  Study date:  Study date: 04/22/2016. Study time: 10:12 AM.  -------------------------------------------------------------------  ------------------------------------------------------------------- Left ventricle:  The cavity size was normal. Wall thickness was normal. Systolic function was at the lower limits of normal.  The estimated ejection fraction was = 50%. Wall motion was normal; there were no regional wall motion abnormalities.  ------------------------------------------------------------------- Aortic valve:   Normal-sized, mildly calcified annulus. Trileaflet; normal thickness leaflets. Cusp separation was normal. Mobility was not restricted.  Doppler:  Transvalvular velocity was within the normal range. There was no stenosis. There was trivial regurgitation directed centrally in the LVOT.  ------------------------------------------------------------------- Aorta:  The aorta was mildly dilated to 3.4 cm at the level of the main PA and is mildly calcified. There was no evidence for dissection.  ------------------------------------------------------------------- Mitral valve:   Normal-sized annulus. Mildly thickened leaflets . Leaflet separation was normal. Mobility was not restricted. No echocardiographic evidence for prolapse. There was nothing to suggest chordal rupture or a flail leaflet. There was no systolic anterior motion.  Doppler:  Transvalvular velocity was within the normal range. There was no evidence for stenosis. There was mild regurgitation directed centrally.    Valve area by continuity equation (using LVOT flow): 3.07 cm^2.    Mean gradient (D): 1 mm Hg.  ------------------------------------------------------------------- Left atrium:   No evidence of thrombus in the atrial cavity or appendage.  ------------------------------------------------------------------- Atrial septum:  No defect or patent foramen ovale was identified.  ------------------------------------------------------------------- Right ventricle:  The cavity size was normal. Systolic function was normal. There were no regional wall motion abnormalities.  ------------------------------------------------------------------- Pulmonic valve:    The valve appears to be grossly normal. Doppler:  There was  trivial regurgitation directed centrally.  ------------------------------------------------------------------- Tricuspid valve:   Normal-sized annulus. Normal thickness leaflets. Leaflet separation was normal. Mobility was not restricted. No echocardiographic evidence for prolapse. There was no evidence for chordal rupture or a flail leaflet.  Doppler:  There was trivial regurgitation directed centrally.  ------------------------------------------------------------------- Pre bypass:  Post bypass: Pos tCPB the  LV systolic function remains 50% without wall motion abnormalities noted. The RV systolic function is normal. There remains trivial central TR and trivial central AI. The MR is trace to mild and central by color Doppler. Pre bypass: Post bypass:  ------------------------------------------------------------------- Post procedure conclusions Ascending Aorta:  - The aorta was mildly dilated to 3.4 cm at the level of the main PA and is mildly calcified.  ------------------------------------------------------------------- Measurements  Left ventricle                          Value Stroke volume, 2D                       65    ml  LVOT                                    Value LVOT ID, S                              20    mm LVOT area                               3.14  cm^2 LVOT peak velocity, S                   94.1  cm/s LVOT mean velocity, S                   64.5  cm/s LVOT VTI, S                             20.7  cm  Mitral valve                            Value Mitral mean velocity, D                 27.2  cm/s Mitral mean gradient, D                 1     mm Hg Mitral valve area, LVOT continuity      3.07  cm^2 Mitral annulus VTI, D                   21.2  cm  Legend: (L)  and  (H)  mark values outside specified reference range.  ------------------------------------------------------------------- Prepared and Electronically Authenticated by  Corky Sox,  M.D. 2017-07-17T20:22:11            Risk Assessment/Calculations:             Physical Exam:   VS:  There were no vitals taken for this visit.   Wt Readings from Last 3 Encounters:  10/13/23 136 lb 11 oz (62 kg)  02/16/22 137 lb 6.4 oz (62.3 kg)  12/31/20 139 lb (63 kg)    GEN: Well nourished, well developed in no acute distress. Sitting comfortably on the exam table  NECK: No JVD; R carotid bruit present  CARDIAC: RRR, soft systolic murmur. Radial pulses 2+ bilaterally  RESPIRATORY:  Clear to auscultation without rales, wheezing or rhonchi. Normal work of breathing on room air  ABDOMEN: Soft, non-tender, non-distended EXTREMITIES:  No edema in BLE; No deformity   ASSESSMENT AND PLAN: .    Preoperative Evaluation  - Patient is scheduled for laparoscopic cholecystectomy on 11/15 - Denies recent chest pain, palpitations, shortness of breath. Denies dizziness, syncope, near syncope  - He remains active and is able to work in his yard for about 1/2 a day. Notes being able to cut down trees without difficulty  - According to the Duke Activity Status Index, his functional capacity in METS is 7.59  - EKG today shows normal sinus rhythm without ischemic changes  - Given his good functional capacity, lack of cardiac symptoms, normal EKG, No further cardiac workup necessary prior to surgery  - Patient had CABG in 2017, stenting of his left common iliac artery in 2017, and femorofemoral crossover grafting in 2018. As he has not had cardiac PCI or peripheral stenting in the past year, OK to hold plavix starting on 11/11. Recommend resuming plavix as soon as adequate hemostasis is achieved post operatively   CAD s/p CABG  - Underwent CABG in 2017 with LIMA to his OM1, vein to OM 2 and a free RIMA to the RCA  - Denies chest pain, DOE  - Continue ASA 81 mg daily, plavix 75 mg daily  - Continue zetia, repatha  - Continue metoprolol tartrate 12.5 mg BID    HTN  - BP well controlled. Denies  symptoms of hypotension  - Continue metoprolol tartrate 12.5 mg BID, lisinopril 2.5 mg daily   PVD  - Patient previously underwent left iliac stenting in 08/2016 and left to right femorofemoral crossover grafting in 2018  - Most recent scans from 03/24/2022 showed patent left proximal-proximal/mid common iliac artery stent with 50-99% in-stent restenosis, narrowing of the proximal anastomosis of his left right femorofemoral crossover graft.  Velocities essentially stable when compared to prior exam - Patient denies claudication  - Ordered ABIs, dopplers for monitoring  - Continue zetia, repatha, ASA   Carotid Artery disease  - Most recent carotid ultrasounds from 03/2022 showed 1-39% stenosis in the left ICA, 60-79% stenosis in the right ICA - Patient has R carotid bruit on exam  - Ordered repeat carotid dopplers for monitoring  - Continue ASA, zetia, repatha   HLD  Statin Intolerance  - On repatha, zetia. He is intolerant of statin therapy  - LDL 103 in 04/2023   Heart Burn  - Patient reports having bothersome heartburn recently. Notes that he feels a burning in his throat. Denies any pain in his chest. Notes that heart burn symptoms occur after eating. Symptoms never occur with exertion. Reports being able to easily differentiate between GI symptoms and angina  - Refilled protonix at patient's request   Dispo: Follow up with Dr. Allyson Sabal in 6 months   Signed, Jonita Albee, PA-C

## 2023-10-16 ENCOUNTER — Encounter: Payer: Self-pay | Admitting: Cardiology

## 2023-10-16 ENCOUNTER — Ambulatory Visit: Payer: Medicare Other | Attending: Cardiology | Admitting: Cardiology

## 2023-10-16 VITALS — BP 130/60 | HR 63 | Ht 66.0 in | Wt 135.0 lb

## 2023-10-16 DIAGNOSIS — Z95828 Presence of other vascular implants and grafts: Secondary | ICD-10-CM

## 2023-10-16 DIAGNOSIS — I739 Peripheral vascular disease, unspecified: Secondary | ICD-10-CM

## 2023-10-16 DIAGNOSIS — Z01810 Encounter for preprocedural cardiovascular examination: Secondary | ICD-10-CM | POA: Diagnosis not present

## 2023-10-16 DIAGNOSIS — Z789 Other specified health status: Secondary | ICD-10-CM | POA: Diagnosis not present

## 2023-10-16 DIAGNOSIS — E782 Mixed hyperlipidemia: Secondary | ICD-10-CM | POA: Diagnosis not present

## 2023-10-16 DIAGNOSIS — I6523 Occlusion and stenosis of bilateral carotid arteries: Secondary | ICD-10-CM | POA: Diagnosis not present

## 2023-10-16 DIAGNOSIS — I2510 Atherosclerotic heart disease of native coronary artery without angina pectoris: Secondary | ICD-10-CM

## 2023-10-16 DIAGNOSIS — Z951 Presence of aortocoronary bypass graft: Secondary | ICD-10-CM

## 2023-10-16 MED ORDER — PANTOPRAZOLE SODIUM 40 MG PO TBEC: 40.0000 mg | DELAYED_RELEASE_TABLET | Freq: Every day | ORAL | 0 refills | Status: DC

## 2023-10-16 NOTE — Patient Instructions (Addendum)
Medication Instructions:  No changes *If you need a refill on your cardiac medications before your next appointment, please call your pharmacy*  Lab Work: No labs  Testing/Procedures: Your physician has requested that you have a carotid duplex. This test is an ultrasound of the carotid arteries in your neck. It looks at blood flow through these arteries that supply the brain with blood. Allow one hour for this exam. There are no restrictions or special instructions. This will take place at [address] [facility], [address].   Please note: We ask at that you not bring children with you during ultrasound (echo/ vascular) testing. Due to room size and safety concerns, children are not allowed in the ultrasound rooms during exams. Our front office staff cannot provide observation of children in our lobby area while testing is being conducted. An adult accompanying a patient to their appointment will only be allowed in the ultrasound room at the discretion of the ultrasound technician under special circumstances. We apologize for any inconvenience.   Your physician has requested that you have an ankle brachial index (ABI). During this test an ultrasound and blood pressure cuff are used to evaluate the arteries that supply the arms and legs with blood. Allow thirty minutes for this exam. There are no restrictions or special instructions. This will take place at [address] [facility], [address].    Please note: We ask at that you not bring children with you during ultrasound (echo/ vascular) testing. Due to room size and safety concerns, children are not allowed in the ultrasound rooms during exams. Our front office staff cannot provide observation of children in our lobby area while testing is being conducted. An adult accompanying a patient to their appointment will only be allowed in the ultrasound room at the discretion of the ultrasound technician under special circumstances. We apologize for any  inconvenience.Your physician has requested that you have an Aorta/Iliac Duplex. This will be take place at [address] [facility], [address].  No food after 11PM the night before.  Water is OK. (Don't drink liquids if you have been instructed not to for ANOTHER test) Avoid foods that produce bowel gas, for 24 hours prior to exam (see below). No breakfast, no chewing gum, no smoking or carbonated beverages. Patient may take morning medications with water. Come in for test at least 15 minutes early to register.  Please note: We ask at that you not bring children with you during ultrasound (echo/ vascular) testing. Due to room size and safety concerns, children are not allowed in the ultrasound rooms during exams. Our front office staff cannot provide observation of children in our lobby area while testing is being conducted. An adult accompanying a patient to their appointment will only be allowed in the ultrasound room at the discretion of the ultrasound technician under special circumstances. We apologize for any inconvenience.Your physician has requested that you have a carotid duplex. This test is an ultrasound of the carotid arteries in your neck. It looks at blood flow through these arteries that supply the brain with blood. Allow one hour for this exam. There are no restrictions or special instructions. This will take place at [address] [facility], [address].  Please note: We ask at that you not bring children with you during ultrasound (echo/ vascular) testing. Due to room size and safety concerns, children are not allowed in the ultrasound rooms during exams. Our front office staff cannot provide observation of children in our lobby area while testing is  being conducted. An adult accompanying a patient to their appointment will only be allowed in the ultrasound room at the discretion of the ultrasound technician under special circumstances. We apologize for any inconvenience.   Follow-Up: At [facility], you and your health needs are our priority.  As part of our continuing mission to provide you with exceptional heart care, we have created designated Provider Care Teams.  These Care Teams include your primary Cardiologist (physician) and Advanced Practice Providers (APPs -  Physician Assistants and Nurse Practitioners) who all work together to provide you with the care you need, when you need it.  We recommend signing up for the patient portal called "MyChart".  Sign up information is provided on this After Visit Summary.  MyChart is used to connect with patients for Virtual Visits (Telemedicine).  Patients are able to view lab/test results, encounter notes, upcoming appointments, etc.  Non-urgent messages can be sent to your provider as well.   To learn more about what you can do with MyChart, go to [email or web address].    Your next appointment:   6 month(s)  Provider:   Nanetta Batty, MD

## 2023-10-17 ENCOUNTER — Encounter: Payer: Self-pay | Admitting: Surgery

## 2023-10-17 ENCOUNTER — Ambulatory Visit: Payer: Medicare Other | Admitting: Surgery

## 2023-10-17 VITALS — BP 142/73 | HR 65 | Temp 98.1°F | Resp 14 | Ht 66.0 in | Wt 135.0 lb

## 2023-10-17 DIAGNOSIS — K8020 Calculus of gallbladder without cholecystitis without obstruction: Secondary | ICD-10-CM

## 2023-10-17 DIAGNOSIS — I739 Peripheral vascular disease, unspecified: Secondary | ICD-10-CM | POA: Diagnosis not present

## 2023-10-17 DIAGNOSIS — I2510 Atherosclerotic heart disease of native coronary artery without angina pectoris: Secondary | ICD-10-CM | POA: Diagnosis not present

## 2023-10-17 NOTE — H&P (View-Only) (Signed)
[facility] History and Physical  Reason for Referral: Cholelithiasis Referring Physician: ED Referral  Chief Complaint   New Patient (Initial Visit)     Derrick [name] is a 77 y.o. male.  HPI: Patient presents for evaluation of cholelithiasis.  He presented to the emergency department on Friday with severe right upper quadrant abdominal pain with nausea and vomiting.  The pain lasted for about 4 to 5 hours.  He had never had an episode like this prior.  Upon evaluation in the emergency department, he was noted to have equivocal findings for acute cholecystitis on CT and abdominal ultrasound.  He had mild elevation in his LFTs without leukocytosis.  He had complete resolution of his abdominal pain, so he was comfortable being discharged home with outpatient follow-up with general surgery.  Since he was discharged home from the hospital, he denies any further abdominal pain.  He denies nausea, vomiting, and he is having regular bowel movements.  He has a past medical history significant for coronary artery disease, peripheral vascular disease, diabetes, COPD, hypertension, and hyperlipidemia.  His surgical history is significant for CABG, femoral to femoral bypass graft, and numerous endovascular procedures including stenting.  He currently takes Plavix for all of his vascular disease.  He started holding his Plavix yesterday.  He denies use of tobacco products, alcohol, and illicit drugs.  Past Medical History:  Diagnosis Date   Anginal pain (HCC)    occ    Chronic renal insufficiency, stage III (moderate) (HCC) 2013   CrCl est high 50s (borderline stage II/III pt denies 12/27/16   COPD (chronic obstructive pulmonary disease) (HCC)    Coronary atherosclerosis of native coronary vessel    S/P NSTEMI 04/11/2009--DEL stent to LAD.  Nuclear stress test 11/2010 NEG, EF normal   Diabetic peripheral neuropathy (HCC)    Painful; 1 vicodin bid very helpful (pt resistant to alternative  tx's)   Early cataracts, bilateral 04/24/2012   [name] in [place], [place]   GERD (gastroesophageal reflux disease)    Heart murmur    patient denies   Herpes zoster    Two separate outbreaks--one on left axillary/back region, one on right axillary/back region   Hyperlipidemia    Hypertension    Mitral regurgitation 08/2013   Moderate (echo)   Myocardial infarction (HCC) 2010   Peripheral arterial disease (HCC)    S/P arterial stent, 08/22/16 Lt [facility] PTA/Stent  08/23/2016   Type II diabetes mellitus (HCC)     Past Surgical History:  Procedure Laterality Date   ANTERIOR CERVICAL DECOMP/DISCECTOMY FUSION  2004 X 2   "1st one collapsed; had to go in 1 month later & do the front and back"   BACK SURGERY     CARDIAC CATHETERIZATION N/A 03/28/2016   Procedure: Left Heart Cath and Coronary Angiography;  Surgeon: Runell Gess, MD;  Location: [facility] INVASIVE CV LAB;  Service: Cardiovascular;  Laterality: N/A;   CARDIOVASCULAR STRESS TEST  11/2010   No ischemia/normal EF   CARPAL TUNNEL RELEASE Left 05/28/2015   Procedure: LEFT HAND CARPAL TUNNEL RELEASE;  Surgeon: Bradly Bienenstock, MD;  Location: [facility];  Service: Orthopedics;  Laterality: Left;   COLONOSCOPY  12/2010   Normal screening colonoscopy; rpt 10 yrs   CORONARY ANGIOPLASTY WITH STENT PLACEMENT  04/2009   DES to LAD   CORONARY ARTERY BYPASS GRAFT N/A 04/22/2016   Procedure: CORONARY ARTERY BYPASS GRAFTING (CABG) LIMA to OM1 SVG to OM2 FREE RIMA to RCA  ENDOSCOPIC HARVEST GREATER SAPHENOUS VEIN -Right Thigh;  Surgeon: Alleen Borne, MD;  Location: [facility] OR;  Service: Open Heart Surgery;  Laterality: N/A;   cryotherapy to AK lesion on nose  01/2010   eye exam,diabetic  02/2011   Normal diabetic retinopathy screening exam at [facility] in [place], [place].   FEMORAL-FEMORAL BYPASS GRAFT Bilateral 12/30/2016   Procedure: BYPASS GRAFT LEFT FEMORAL-RIGHT FEMORAL ARTERY;  Surgeon: Nada Libman, MD;  Location: [facility] OR;  Service:  Vascular;  Laterality: Bilateral;   ILIAC VEIN ANGIOPLASTY / STENTING  08/22/2016   Abdominal aortogram, bilateral iliac angiogram, bifemoral runoff   LOWER EXTREMITY ANGIOGRAM  10/13/2016   LUMBAR FUSION  2002   "put screws in"   PERIPHERAL VASCULAR CATHETERIZATION N/A 03/28/2016   Procedure: Abdominal Aortogram;  Surgeon: Runell Gess, MD;  Location: [facility] INVASIVE CV LAB;  Service: Cardiovascular;  Laterality: N/A;   PERIPHERAL VASCULAR CATHETERIZATION Bilateral 08/22/2016   Procedure: Lower Extremity Angiography;  Surgeon: Runell Gess, MD;  Location: [facility] INVASIVE CV LAB;  Service: Cardiovascular;  Laterality: Bilateral;   PERIPHERAL VASCULAR CATHETERIZATION Left 08/22/2016   Procedure: Peripheral Vascular Intervention;  Surgeon: Runell Gess, MD;  Location: [facility] INVASIVE CV LAB;  Service: Cardiovascular;  Laterality: Left;  COMMON ILIAC   PERIPHERAL VASCULAR CATHETERIZATION Bilateral 10/13/2016   Procedure: Lower Extremity Angiography;  Surgeon: Runell Gess, MD;  Location: [facility] INVAS[facility] CV LAB;  Service: Cardiovascular;  Laterality: Bilateral;   PERIPHERAL VASCULAR CATHETERIZATION Right 10/13/2016   Procedure: Peripheral Vascular Balloon Angioplasty;  Surgeon: Runell Gess, MD;  Location: [facility] INVASIVE CV LAB;  Service: Cardiovascular;  Laterality: Right;  Right Common ILIac   POSTERIOR FUSION CERVICAL SPINE  2004   TEE WITHOUT CARDIOVERSION N/A 04/22/2016   Procedure: TRANSESOPHAGEAL ECHOCARDIOGRAM (TEE);  Surgeon: Alleen Borne, MD;  Location: [facility] OR;  Service: Open Heart Surgery;  Laterality: N/A;   TRANSTHORACIC ECHOCARDIOGRAM  08/2013   LVH, EF 55-65%, no WMA.  Moderate mitral regurg    Family History  Problem Relation Age of Onset   Brain cancer Mother    Diabetes Father    Heart disease Father    Diabetes Brother    Heart disease Brother    Diabetes Son    Hyperlipidemia Son    Coronary artery disease Other        family hx of    Social History   Tobacco Use    Smoking status: Former    Current packs/day: 0.00    Average packs/day: 1 pack/day for 50.0 years (50.0 ttl pk-yrs)    Types: Cigarettes    Start date: 01/06/1959    Quit date: 01/06/2009    Years since quitting: 14.7   Smokeless tobacco: Never  Vaping Use   Vaping status: Former  Substance Use Topics   Alcohol use: No    Alcohol/week: 0.0 standard drinks of alcohol    Comment: "drank some in high school"   Drug use: No    Medications: I have reviewed the patient's current medications. Allergies as of 10/17/2023       Reactions   Atorvastatin Other (See Comments)   MYALGIAS MUSCLE CRAMPS   Lovastatin Other (See Comments)   MYALGIAS MUSCLE CRAMPS   Pravastatin Other (See Comments)   MYALGIAS MUSCLE CRAMPS   Simvastatin Other (See Comments)   MYALGIAS MUSCLE CRAMPS        Medication List        Accurate as of October 17, 2023 11:03 AM.  If you have any questions, ask your nurse or doctor.          aspirin EC 81 MG tablet Take 1 tablet (81 mg total) by mouth daily.   cefadroxil 500 MG capsule Commonly known as: DURICEF Take 1 capsule (500 mg total) by mouth 2 (two) times daily for 7 days.   clopidogrel 75 MG tablet Commonly known as: PLAVIX Take by mouth.   ezetimibe 10 MG tablet Commonly known as: ZETIA Take 1 tablet by mouth daily.   glucose blood test strip Commonly known as: ONE TOUCH ULTRA TEST USE ONE STRIP TO CHECK GLUCOSE 4 TIMES DAILY AS NEEDED   HYDROcodone-acetaminophen 5-325 MG tablet Commonly known as: NORCO/VICODIN Take 1 tablet by mouth every 6 (six) hours as needed for moderate pain. What changed: when to take this   insulin lispro 100 UNIT/ML injection Commonly known as: HUMALOG up to 100 units via insulin pump   insulin lispro 100 UNIT/ML injection Commonly known as: HUMALOG SMARTSIG:0-100 Unit(s) SUB-Q As Directed   lisinopril 2.5 MG tablet Commonly known as: ZESTRIL Take 2.5 mg by mouth daily.   metoprolol tartrate 25 MG  tablet Commonly known as: LOPRESSOR Take 0.5 tablets (12.5 mg total) by mouth 2 (two) times daily.   metroNIDAZOLE 500 MG tablet Commonly known as: FLAGYL Take 1 tablet (500 mg total) by mouth 2 (two) times daily.   ONE TOUCH ULTRA SYSTEM KIT w/Device Kit Patient must check sugar TID   OneTouch Delica Lancets 33G Misc USE ONE  4 TIMES DAILY AS NEEDED   pantoprazole 40 MG tablet Commonly known as: PROTONIX Take 1 tablet (40 mg total) by mouth daily.   Repatha SureClick 140 MG/ML Soaj Generic drug: Evolocumab   research study medication Take 1 each by mouth daily. Study drug for cholesterol from Dr. Merri Brunette         ROS:  Constitutional: negative for chills, fatigue, and fevers Eyes: negative for visual disturbance and pain Ears, nose, mouth, throat, and face: negative for ear drainage, sore throat, and sinus problems Respiratory: negative for cough, wheezing, and shortness of breath Cardiovascular: negative for chest pain and palpitations Gastrointestinal: negative for abdominal pain, nausea, reflux symptoms, and vomiting Genitourinary:negative for dysuria and frequency Integument/breast: negative for dryness and rash Hematologic/lymphatic: negative for bleeding and lymphadenopathy Musculoskeletal:negative for back pain and neck pain Neurological: positive for tremors, negative for dizziness Endocrine: negative for temperature intolerance  Blood pressure (!) 142/73, pulse 65, temperature 98.1 F (36.7 C), temperature source Oral, resp. rate 14, height 5\' 6"  (1.676 m), weight 135 lb (61.2 kg), SpO2 95%. Physical Exam Vitals reviewed.  Constitutional:      Appearance: Normal appearance.  HENT:     Head: Normocephalic and atraumatic.  Eyes:     Extraocular Movements: Extraocular movements intact.     Pupils: Pupils are equal, round, and reactive to light.  Cardiovascular:     Rate and Rhythm: Normal rate and regular rhythm.  Pulmonary:     Effort: Pulmonary  effort is normal.     Breath sounds: Normal breath sounds.  Abdominal:     Comments: Abdomen soft, nondistended, no percussion tenderness, nontender to palpation; no rigidity, guarding, rebound tenderness; negative Murphy sign  Musculoskeletal:        General: Normal range of motion.     Cervical back: Normal range of motion.  Skin:    General: Skin is warm and dry.  Neurological:     General: No focal deficit present.  Mental Status: He is alert and oriented to person, place, and time.  Psychiatric:        Mood and Affect: Mood normal.        Behavior: Behavior normal.     Results: Noncontrasted CT of the abdomen and pelvis (10/13/2023): IMPRESSION: 1. Subtle ill definition of gallbladder wall without definite thickening on noncontrast CT imaging. No calcified gallstones. No pericholecystic fluid. Right upper quadrant ultrasound may prove helpful to further evaluate as clinically warranted. 2. 2 mm nonobstructing stone interpolar right kidney. 3. Aortic Atherosclerosis (ICD10-I70.0) and Emphysema (ICD10-J43.9).  Abdominal ultrasound (10/13/2023): IMPRESSION: Cholelithiasis with mild circumferential gallbladder mural thickening and trace pericholecystic free fluid. Findings are suspicious for acute cholecystitis although no sonographic Murphy sign noted by sonographer. Consider further evaluation with nuclear medicine hepatobiliary scan as clinically indicated.  Recent Results (from the past 2160 hour(s))  Comprehensive metabolic panel     Status: Abnormal   Collection Time: 10/13/23  5:18 AM  Result Value Ref Range   Sodium 135 135 - 145 mmol/L   Potassium 5.1 3.5 - 5.1 mmol/L   Chloride 100 98 - 111 mmol/L   CO2 23 22 - 32 mmol/L   Glucose, Bld 335 (H) 70 - 99 mg/dL    Comment: Glucose reference range applies only to samples taken after fasting for at least 8 hours.   BUN 24 (H) 8 - 23 mg/dL   Creatinine, Ser 1.61 (H) 0.61 - 1.24 mg/dL   Calcium 9.0 8.9 - 09.6 mg/dL    Total Protein 7.0 6.5 - 8.1 g/dL   Albumin 4.3 3.5 - 5.0 g/dL   AST 045 (H) 15 - 41 U/L   ALT 79 (H) 0 - 44 U/L   Alkaline Phosphatase 70 38 - 126 U/L   Total Bilirubin 2.4 (H) <1.2 mg/dL   GFR, Estimated 43 (L) >60 mL/min    Comment: (NOTE) Calculated using the CKD-EPI Creatinine Equation (2021)    Anion gap 12 5 - 15    Comment: Performed at [facility], [address] [place], [place] [address]  CBC with Differential     Status: Abnormal   Collection Time: 10/13/23  5:18 AM  Result Value Ref Range   WBC 10.5 4.0 - 10.5 K/uL   RBC 4.42 4.22 - 5.81 MIL/uL   Hemoglobin 12.1 (L) 13.0 - 17.0 g/dL   HCT 19.1 (L) 47.8 - 29.5 %   MCV 85.5 80.0 - 100.0 fL   MCH 27.4 26.0 - 34.0 pg   MCHC 32.0 30.0 - 36.0 g/dL   RDW 62.1 (H) 30.8 - 65.7 %   Platelets 222 150 - 400 K/uL   nRBC 0.0 0.0 - 0.2 %   Neutrophils Relative % 92 %   Neutro Abs 9.5 (H) 1.7 - 7.7 K/uL   Lymphocytes Relative 5 %   Lymphs Abs 0.5 (L) 0.7 - 4.0 K/uL   Monocytes Relative 3 %   Monocytes Absolute 0.3 0.1 - 1.0 K/uL   Eosinophils Relative 0 %   Eosinophils Absolute 0.0 0.0 - 0.5 K/uL   Basophils Relative 0 %   Basophils Absolute 0.0 0.0 - 0.1 K/uL   Immature Granulocytes 0 %   Abs Immature Granulocytes 0.04 0.00 - 0.07 K/uL    Comment: Performed at [facility], [address] [place], [place] [address]  Lipase, blood     Status: None   Collection Time: 10/13/23  5:18 AM  Result Value Ref Range   Lipase 23 11 - 51  U/L    Comment: Performed at [facility], [address] [place], [place] [address]  Troponin I (High Sensitivity)     Status: None   Collection Time: 10/13/23  5:18 AM  Result Value Ref Range   Troponin I (High Sensitivity) 5 <18 ng/L    Comment: (NOTE) Elevated high sensitivity troponin I (hsTnI) values and significant  changes across serial measurements may suggest ACS but many other  chronic and acute conditions are known to elevate hsTnI results.  Refer to the "Links" section for chest  pain algorithms and additional  guidance. Performed at [facility], [address] [place], [place] [address]   Urinalysis, Routine w reflex microscopic -Urine, Clean Catch     Status: Abnormal   Collection Time: 10/13/23  6:27 AM  Result Value Ref Range   Color, Urine YELLOW YELLOW   APPearance CLEAR CLEAR   Specific Gravity, Urine 1.022 1.005 - 1.030   pH 6.0 5.0 - 8.0   Glucose, UA >=500 (A) NEGATIVE mg/dL   Hgb urine dipstick NEGATIVE NEGATIVE   Bilirubin Urine NEGATIVE NEGATIVE   Ketones, ur 20 (A) NEGATIVE mg/dL   Protein, ur NEGATIVE NEGATIVE mg/dL   Nitrite NEGATIVE NEGATIVE   Leukocytes,Ua NEGATIVE NEGATIVE   RBC / HPF 0-5 0 - 5 RBC/hpf   WBC, UA 0-5 0 - 5 WBC/hpf   Bacteria, UA NONE SEEN NONE SEEN   Squamous Epithelial / HPF 0-5 0 - 5 /HPF    Comment: Performed at [facility], [address], [place], [place] [address]  Troponin I (High Sensitivity)     Status: None   Collection Time: 10/13/23  8:19 AM  Result Value Ref Range   Troponin I (High Sensitivity) 5 <18 ng/L    Comment: (NOTE) Elevated high sensitivity troponin I (hsTnI) values and significant  changes across serial measurements may suggest ACS but many other  chronic and acute conditions are known to elevate hsTnI results.  Refer to the "Links" section for chest pain algorithms and additional  guidance. Performed at [facility], [address], [place], [place] [address]   Hepatic function panel     Status: Abnormal   Collection Time: 10/17/23 11:56 AM  Result Value Ref Range   Total Protein 6.6 6.0 - 8.5 g/dL   Albumin 4.3 3.8 - 4.8 g/dL   Bilirubin Total 0.9 0.0 - 1.2 mg/dL   Bilirubin, Direct 2.95 0.00 - 0.40 mg/dL   Alkaline Phosphatase 81 44 - 121 IU/L   AST 30 0 - 40 IU/L   ALT 51 (H) 0 - 44 IU/L      Assessment & Plan:  Derrick [name] is a 77 y.o. male who presents for evaluation of cholelithiasis  -Discussed that pathophysiology of gallbladder disease and need for surgical removal -I  counseled the patient about the indication, risks and benefits of robotic assisted laparoscopic cholecystectomy.  He understands there is a very small chance for bleeding, infection, injury to normal structures (including common bile duct), conversion to open surgery, persistent symptoms, evolution of postcholecystectomy diarrhea, need for secondary interventions, anesthesia reaction, cardiopulmonary issues and other risks not specifically detailed here. I described the expected recovery, the plan for follow-up and the restrictions during the recovery phase.  All questions were answered. -Patient tentatively scheduled for surgery on 11/15 -Patient saw cardiology yesterday, and he is of appropriate risk for surgery.  Hold Plavix for 5 days prior to surgery -Repeat hepatic function panel ordered to verify improvement in LFTs -Advised  that he should present to the hospital if he begins to have fevers, chills, nausea, vomiting, or worsening right upper quadrant abdominal pain. -Information provided to the patient regarding cholelithiasis, cholecystitis, cholecystectomy, and low fat diet  All questions were answered to the satisfaction of the patient and family.  Theophilus Kinds, DO [facility] [address] [place], [place] [address] [phone] (office)

## 2023-10-17 NOTE — Progress Notes (Signed)
[facility] History and Physical  Reason for Referral: Cholelithiasis Referring Physician: ED Referral  Chief Complaint   New Patient (Initial Visit)     Derrick [name] is a 77 y.o. male.  HPI: Patient presents for evaluation of cholelithiasis.  He presented to the emergency department on Friday with severe right upper quadrant abdominal pain with nausea and vomiting.  The pain lasted for about 4 to 5 hours.  He had never had an episode like this prior.  Upon evaluation in the emergency department, he was noted to have equivocal findings for acute cholecystitis on CT and abdominal ultrasound.  He had mild elevation in his LFTs without leukocytosis.  He had complete resolution of his abdominal pain, so he was comfortable being discharged home with outpatient follow-up with general surgery.  Since he was discharged home from the hospital, he denies any further abdominal pain.  He denies nausea, vomiting, and he is having regular bowel movements.  He has a past medical history significant for coronary artery disease, peripheral vascular disease, diabetes, COPD, hypertension, and hyperlipidemia.  His surgical history is significant for CABG, femoral to femoral bypass graft, and numerous endovascular procedures including stenting.  He currently takes Plavix for all of his vascular disease.  He started holding his Plavix yesterday.  He denies use of tobacco products, alcohol, and illicit drugs.  Past Medical History:  Diagnosis Date   Anginal pain (HCC)    occ    Chronic renal insufficiency, stage III (moderate) (HCC) 2013   CrCl est high 50s (borderline stage II/III pt denies 12/27/16   COPD (chronic obstructive pulmonary disease) (HCC)    Coronary atherosclerosis of native coronary vessel    S/P NSTEMI 04/11/2009--DEL stent to LAD.  Nuclear stress test 11/2010 NEG, EF normal   Diabetic peripheral neuropathy (HCC)    Painful; 1 vicodin bid very helpful (pt resistant to alternative  tx's)   Early cataracts, bilateral 04/24/2012   [name] in [place], [place]   GERD (gastroesophageal reflux disease)    Heart murmur    patient denies   Herpes zoster    Two separate outbreaks--one on left axillary/back region, one on right axillary/back region   Hyperlipidemia    Hypertension    Mitral regurgitation 08/2013   Moderate (echo)   Myocardial infarction (HCC) 2010   Peripheral arterial disease (HCC)    S/P arterial stent, 08/22/16 Lt [facility] PTA/Stent  08/23/2016   Type II diabetes mellitus (HCC)     Past Surgical History:  Procedure Laterality Date   ANTERIOR CERVICAL DECOMP/DISCECTOMY FUSION  2004 X 2   "1st one collapsed; had to go in 1 month later & do the front and back"   BACK SURGERY     CARDIAC CATHETERIZATION N/A 03/28/2016   Procedure: Left Heart Cath and Coronary Angiography;  Surgeon: Runell Gess, MD;  Location: [facility] INVASIVE CV LAB;  Service: Cardiovascular;  Laterality: N/A;   CARDIOVASCULAR STRESS TEST  11/2010   No ischemia/normal EF   CARPAL TUNNEL RELEASE Left 05/28/2015   Procedure: LEFT HAND CARPAL TUNNEL RELEASE;  Surgeon: Bradly Bienenstock, MD;  Location: [facility];  Service: Orthopedics;  Laterality: Left;   COLONOSCOPY  12/2010   Normal screening colonoscopy; rpt 10 yrs   CORONARY ANGIOPLASTY WITH STENT PLACEMENT  04/2009   DES to LAD   CORONARY ARTERY BYPASS GRAFT N/A 04/22/2016   Procedure: CORONARY ARTERY BYPASS GRAFTING (CABG) LIMA to OM1 SVG to OM2 FREE RIMA to RCA  ENDOSCOPIC HARVEST GREATER SAPHENOUS VEIN -Right Thigh;  Surgeon: Alleen Borne, MD;  Location: [facility] OR;  Service: Open Heart Surgery;  Laterality: N/A;   cryotherapy to AK lesion on nose  01/2010   eye exam,diabetic  02/2011   Normal diabetic retinopathy screening exam at [facility] in [place], [place].   FEMORAL-FEMORAL BYPASS GRAFT Bilateral 12/30/2016   Procedure: BYPASS GRAFT LEFT FEMORAL-RIGHT FEMORAL ARTERY;  Surgeon: Nada Libman, MD;  Location: [facility] OR;  Service:  Vascular;  Laterality: Bilateral;   ILIAC VEIN ANGIOPLASTY / STENTING  08/22/2016   Abdominal aortogram, bilateral iliac angiogram, bifemoral runoff   LOWER EXTREMITY ANGIOGRAM  10/13/2016   LUMBAR FUSION  2002   "put screws in"   PERIPHERAL VASCULAR CATHETERIZATION N/A 03/28/2016   Procedure: Abdominal Aortogram;  Surgeon: Runell Gess, MD;  Location: [facility] INVASIVE CV LAB;  Service: Cardiovascular;  Laterality: N/A;   PERIPHERAL VASCULAR CATHETERIZATION Bilateral 08/22/2016   Procedure: Lower Extremity Angiography;  Surgeon: Runell Gess, MD;  Location: [facility] INVASIVE CV LAB;  Service: Cardiovascular;  Laterality: Bilateral;   PERIPHERAL VASCULAR CATHETERIZATION Left 08/22/2016   Procedure: Peripheral Vascular Intervention;  Surgeon: Runell Gess, MD;  Location: [facility] INVASIVE CV LAB;  Service: Cardiovascular;  Laterality: Left;  COMMON ILIAC   PERIPHERAL VASCULAR CATHETERIZATION Bilateral 10/13/2016   Procedure: Lower Extremity Angiography;  Surgeon: Runell Gess, MD;  Location: [facility]VAS[facility] CV LAB;  Service: Cardiovascular;  Laterality: Bilateral;   PERIPHERAL VASCULAR CATHETERIZATION Right 10/13/2016   Procedure: Peripheral Vascular Balloon Angioplasty;  Surgeon: Runell Gess, MD;  Location: [facility] INVASIVE CV LAB;  Service: Cardiovascular;  Laterality: Right;  Right Common ILIac   POSTERIOR FUSION CERVICAL SPINE  2004   TEE WITHOUT CARDIOVERSION N/A 04/22/2016   Procedure: TRANSESOPHAGEAL ECHOCARDIOGRAM (TEE);  Surgeon: Alleen Borne, MD;  Location: [facility] OR;  Service: Open Heart Surgery;  Laterality: N/A;   TRANSTHORACIC ECHOCARDIOGRAM  08/2013   LVH, EF 55-65%, no WMA.  Moderate mitral regurg    Family History  Problem Relation Age of Onset   Brain cancer Mother    Diabetes Father    Heart disease Father    Diabetes Brother    Heart disease Brother    Diabetes Son    Hyperlipidemia Son    Coronary artery disease Other        family hx of    Social History   Tobacco Use    Smoking status: Former    Current packs/day: 0.00    Average packs/day: 1 pack/day for 50.0 years (50.0 ttl pk-yrs)    Types: Cigarettes    Start date: 01/06/1959    Quit date: 01/06/2009    Years since quitting: 14.7   Smokeless tobacco: Never  Vaping Use   Vaping status: Former  Substance Use Topics   Alcohol use: No    Alcohol/week: 0.0 standard drinks of alcohol    Comment: "drank some in high school"   Drug use: No    Medications: I have reviewed the patient's current medications. Allergies as of 10/17/2023       Reactions   Atorvastatin Other (See Comments)   MYALGIAS MUSCLE CRAMPS   Lovastatin Other (See Comments)   MYALGIAS MUSCLE CRAMPS   Pravastatin Other (See Comments)   MYALGIAS MUSCLE CRAMPS   Simvastatin Other (See Comments)   MYALGIAS MUSCLE CRAMPS        Medication List        Accurate as of October 17, 2023 11:03 AM.  If you have any questions, ask your nurse or doctor.          aspirin EC 81 MG tablet Take 1 tablet (81 mg total) by mouth daily.   cefadroxil 500 MG capsule Commonly known as: DURICEF Take 1 capsule (500 mg total) by mouth 2 (two) times daily for 7 days.   clopidogrel 75 MG tablet Commonly known as: PLAVIX Take by mouth.   ezetimibe 10 MG tablet Commonly known as: ZETIA Take 1 tablet by mouth daily.   glucose blood test strip Commonly known as: ONE TOUCH ULTRA TEST USE ONE STRIP TO CHECK GLUCOSE 4 TIMES DAILY AS NEEDED   HYDROcodone-acetaminophen 5-325 MG tablet Commonly known as: NORCO/VICODIN Take 1 tablet by mouth every 6 (six) hours as needed for moderate pain. What changed: when to take this   insulin lispro 100 UNIT/ML injection Commonly known as: HUMALOG up to 100 units via insulin pump   insulin lispro 100 UNIT/ML injection Commonly known as: HUMALOG SMARTSIG:0-100 Unit(s) SUB-Q As Directed   lisinopril 2.5 MG tablet Commonly known as: ZESTRIL Take 2.5 mg by mouth daily.   metoprolol tartrate 25 MG  tablet Commonly known as: LOPRESSOR Take 0.5 tablets (12.5 mg total) by mouth 2 (two) times daily.   metroNIDAZOLE 500 MG tablet Commonly known as: FLAGYL Take 1 tablet (500 mg total) by mouth 2 (two) times daily.   ONE TOUCH ULTRA SYSTEM KIT w/Device Kit Patient must check sugar TID   OneTouch Delica Lancets 33G Misc USE ONE  4 TIMES DAILY AS NEEDED   pantoprazole 40 MG tablet Commonly known as: PROTONIX Take 1 tablet (40 mg total) by mouth daily.   Repatha SureClick 140 MG/ML Soaj Generic drug: Evolocumab   research study medication Take 1 each by mouth daily. Study drug for cholesterol from Dr. Merri Brunette         ROS:  Constitutional: negative for chills, fatigue, and fevers Eyes: negative for visual disturbance and pain Ears, nose, mouth, throat, and face: negative for ear drainage, sore throat, and sinus problems Respiratory: negative for cough, wheezing, and shortness of breath Cardiovascular: negative for chest pain and palpitations Gastrointestinal: negative for abdominal pain, nausea, reflux symptoms, and vomiting Genitourinary:negative for dysuria and frequency Integument/breast: negative for dryness and rash Hematologic/lymphatic: negative for bleeding and lymphadenopathy Musculoskeletal:negative for back pain and neck pain Neurological: positive for tremors, negative for dizziness Endocrine: negative for temperature intolerance  Blood pressure (!) 142/73, pulse 65, temperature 98.1 F (36.7 C), temperature source Oral, resp. rate 14, height 5\' 6"  (1.676 m), weight 135 lb (61.2 kg), SpO2 95%. Physical Exam Vitals reviewed.  Constitutional:      Appearance: Normal appearance.  HENT:     Head: Normocephalic and atraumatic.  Eyes:     Extraocular Movements: Extraocular movements intact.     Pupils: Pupils are equal, round, and reactive to light.  Cardiovascular:     Rate and Rhythm: Normal rate and regular rhythm.  Pulmonary:     Effort: Pulmonary  effort is normal.     Breath sounds: Normal breath sounds.  Abdominal:     Comments: Abdomen soft, nondistended, no percussion tenderness, nontender to palpation; no rigidity, guarding, rebound tenderness; negative Murphy sign  Musculoskeletal:        General: Normal range of motion.     Cervical back: Normal range of motion.  Skin:    General: Skin is warm and dry.  Neurological:     General: No focal deficit present.  Mental Status: He is alert and oriented to person, place, and time.  Psychiatric:        Mood and Affect: Mood normal.        Behavior: Behavior normal.     Results: Noncontrasted CT of the abdomen and pelvis (10/13/2023): IMPRESSION: 1. Subtle ill definition of gallbladder wall without definite thickening on noncontrast CT imaging. No calcified gallstones. No pericholecystic fluid. Right upper quadrant ultrasound may prove helpful to further evaluate as clinically warranted. 2. 2 mm nonobstructing stone interpolar right kidney. 3. Aortic Atherosclerosis (ICD10-I70.0) and Emphysema (ICD10-J43.9).  Abdominal ultrasound (10/13/2023): IMPRESSION: Cholelithiasis with mild circumferential gallbladder mural thickening and trace pericholecystic free fluid. Findings are suspicious for acute cholecystitis although no sonographic Murphy sign noted by sonographer. Consider further evaluation with nuclear medicine hepatobiliary scan as clinically indicated.  Recent Results (from the past 2160 hour(s))  Comprehensive metabolic panel     Status: Abnormal   Collection Time: 10/13/23  5:18 AM  Result Value Ref Range   Sodium 135 135 - 145 mmol/L   Potassium 5.1 3.5 - 5.1 mmol/L   Chloride 100 98 - 111 mmol/L   CO2 23 22 - 32 mmol/L   Glucose, Bld 335 (H) 70 - 99 mg/dL    Comment: Glucose reference range applies only to samples taken after fasting for at least 8 hours.   BUN 24 (H) 8 - 23 mg/dL   Creatinine, Ser 1.61 (H) 0.61 - 1.24 mg/dL   Calcium 9.0 8.9 - 09.6 mg/dL    Total Protein 7.0 6.5 - 8.1 g/dL   Albumin 4.3 3.5 - 5.0 g/dL   AST 045 (H) 15 - 41 U/L   ALT 79 (H) 0 - 44 U/L   Alkaline Phosphatase 70 38 - 126 U/L   Total Bilirubin 2.4 (H) <1.2 mg/dL   GFR, Estimated 43 (L) >60 mL/min    Comment: (NOTE) Calculated using the CKD-EPI Creatinine Equation (2021)    Anion gap 12 5 - 15    Comment: Performed at [facility], [address] [place], [place] [address]  CBC with Differential     Status: Abnormal   Collection Time: 10/13/23  5:18 AM  Result Value Ref Range   WBC 10.5 4.0 - 10.5 K/uL   RBC 4.42 4.22 - 5.81 MIL/uL   Hemoglobin 12.1 (L) 13.0 - 17.0 g/dL   HCT 19.1 (L) 47.8 - 29.5 %   MCV 85.5 80.0 - 100.0 fL   MCH 27.4 26.0 - 34.0 pg   MCHC 32.0 30.0 - 36.0 g/dL   RDW 62.1 (H) 30.8 - 65.7 %   Platelets 222 150 - 400 K/uL   nRBC 0.0 0.0 - 0.2 %   Neutrophils Relative % 92 %   Neutro Abs 9.5 (H) 1.7 - 7.7 K/uL   Lymphocytes Relative 5 %   Lymphs Abs 0.5 (L) 0.7 - 4.0 K/uL   Monocytes Relative 3 %   Monocytes Absolute 0.3 0.1 - 1.0 K/uL   Eosinophils Relative 0 %   Eosinophils Absolute 0.0 0.0 - 0.5 K/uL   Basophils Relative 0 %   Basophils Absolute 0.0 0.0 - 0.1 K/uL   Immature Granulocytes 0 %   Abs Immature Granulocytes 0.04 0.00 - 0.07 K/uL    Comment: Performed at [facility], [address] [place], [place] [address]  Lipase, blood     Status: None   Collection Time: 10/13/23  5:18 AM  Result Value Ref Range   Lipase 23 11 - 51  U/L    Comment: Performed at [facility], [address] [place], [place] [address]  Troponin I (High Sensitivity)     Status: None   Collection Time: 10/13/23  5:18 AM  Result Value Ref Range   Troponin I (High Sensitivity) 5 <18 ng/L    Comment: (NOTE) Elevated high sensitivity troponin I (hsTnI) values and significant  changes across serial measurements may suggest ACS but many other  chronic and acute conditions are known to elevate hsTnI results.  Refer to the "Links" section for chest  pain algorithms and additional  guidance. Performed at [facility], [address] [place], [place] [address]   Urinalysis, Routine w reflex microscopic -Urine, Clean Catch     Status: Abnormal   Collection Time: 10/13/23  6:27 AM  Result Value Ref Range   Color, Urine YELLOW YELLOW   APPearance CLEAR CLEAR   Specific Gravity, Urine 1.022 1.005 - 1.030   pH 6.0 5.0 - 8.0   Glucose, UA >=500 (A) NEGATIVE mg/dL   Hgb urine dipstick NEGATIVE NEGATIVE   Bilirubin Urine NEGATIVE NEGATIVE   Ketones, ur 20 (A) NEGATIVE mg/dL   Protein, ur NEGATIVE NEGATIVE mg/dL   Nitrite NEGATIVE NEGATIVE   Leukocytes,Ua NEGATIVE NEGATIVE   RBC / HPF 0-5 0 - 5 RBC/hpf   WBC, UA 0-5 0 - 5 WBC/hpf   Bacteria, UA NONE SEEN NONE SEEN   Squamous Epithelial / HPF 0-5 0 - 5 /HPF    Comment: Performed at [facility], [address], [place], [place] [address]  Troponin I (High Sensitivity)     Status: None   Collection Time: 10/13/23  8:19 AM  Result Value Ref Range   Troponin I (High Sensitivity) 5 <18 ng/L    Comment: (NOTE) Elevated high sensitivity troponin I (hsTnI) values and significant  changes across serial measurements may suggest ACS but many other  chronic and acute conditions are known to elevate hsTnI results.  Refer to the "Links" section for chest pain algorithms and additional  guidance. Performed at [facility], [address], [place], [place] [address]   Hepatic function panel     Status: Abnormal   Collection Time: 10/17/23 11:56 AM  Result Value Ref Range   Total Protein 6.6 6.0 - 8.5 g/dL   Albumin 4.3 3.8 - 4.8 g/dL   Bilirubin Total 0.9 0.0 - 1.2 mg/dL   Bilirubin, Direct 2.95 0.00 - 0.40 mg/dL   Alkaline Phosphatase 81 44 - 121 IU/L   AST 30 0 - 40 IU/L   ALT 51 (H) 0 - 44 IU/L      Assessment & Plan:  Derrick [name] is a 77 y.o. male who presents for evaluation of cholelithiasis  -Discussed that pathophysiology of gallbladder disease and need for surgical removal -I  counseled the patient about the indication, risks and benefits of robotic assisted laparoscopic cholecystectomy.  He understands there is a very small chance for bleeding, infection, injury to normal structures (including common bile duct), conversion to open surgery, persistent symptoms, evolution of postcholecystectomy diarrhea, need for secondary interventions, anesthesia reaction, cardiopulmonary issues and other risks not specifically detailed here. I described the expected recovery, the plan for follow-up and the restrictions during the recovery phase.  All questions were answered. -Patient tentatively scheduled for surgery on 11/15 -Patient saw cardiology yesterday, and he is of appropriate risk for surgery.  Hold Plavix for 5 days prior to surgery -Repeat hepatic function panel ordered to verify improvement in LFTs -Advised  that he should present to the hospital if he begins to have fevers, chills, nausea, vomiting, or worsening right upper quadrant abdominal pain. -Information provided to the patient regarding cholelithiasis, cholecystitis, cholecystectomy, and low fat diet  All questions were answered to the satisfaction of the patient and family.  Theophilus Kinds, DO [facility] [address] [place], [place] [address] [phone] (office)

## 2023-10-18 LAB — HEPATIC FUNCTION PANEL
ALT: 51 [IU]/L — ABNORMAL HIGH (ref 0–44)
AST: 30 [IU]/L (ref 0–40)
Albumin: 4.3 g/dL (ref 3.8–4.8)
Alkaline Phosphatase: 81 [IU]/L (ref 44–121)
Bilirubin Total: 0.9 mg/dL (ref 0.0–1.2)
Bilirubin, Direct: 0.29 mg/dL (ref 0.00–0.40)
Total Protein: 6.6 g/dL (ref 6.0–8.5)

## 2023-10-19 ENCOUNTER — Other Ambulatory Visit: Payer: Self-pay

## 2023-10-19 ENCOUNTER — Encounter (HOSPITAL_COMMUNITY)
Admission: RE | Admit: 2023-10-19 | Discharge: 2023-10-19 | Disposition: A | Discharge status: Home / Self Care | Payer: Medicare Other | Source: Ambulatory Visit | Attending: Surgery | Admitting: Surgery

## 2023-10-19 ENCOUNTER — Other Ambulatory Visit (HOSPITAL_COMMUNITY): Payer: Medicare Other

## 2023-10-19 ENCOUNTER — Encounter (HOSPITAL_COMMUNITY): Payer: Self-pay

## 2023-10-20 ENCOUNTER — Ambulatory Visit (HOSPITAL_COMMUNITY): Payer: Medicare Other | Admitting: Certified Registered"

## 2023-10-20 ENCOUNTER — Encounter (HOSPITAL_COMMUNITY)
Admission: RE | Disposition: A | Discharge status: Home / Self Care | Payer: Self-pay | Source: Home / Self Care | Attending: Surgery

## 2023-10-20 ENCOUNTER — Ambulatory Visit (HOSPITAL_COMMUNITY)
Admission: RE | Admit: 2023-10-20 | Discharge: 2023-10-20 | Disposition: A | Discharge status: Home / Self Care | Payer: Medicare Other | Attending: Surgery | Admitting: Surgery

## 2023-10-20 ENCOUNTER — Other Ambulatory Visit: Payer: Self-pay

## 2023-10-20 ENCOUNTER — Encounter (HOSPITAL_COMMUNITY): Payer: Self-pay | Admitting: Surgery

## 2023-10-20 DIAGNOSIS — K8020 Calculus of gallbladder without cholecystitis without obstruction: Secondary | ICD-10-CM

## 2023-10-20 DIAGNOSIS — N183 Chronic kidney disease, stage 3 unspecified: Secondary | ICD-10-CM | POA: Insufficient documentation

## 2023-10-20 DIAGNOSIS — Z87891 Personal history of nicotine dependence: Secondary | ICD-10-CM | POA: Insufficient documentation

## 2023-10-20 DIAGNOSIS — I129 Hypertensive chronic kidney disease with stage 1 through stage 4 chronic kidney disease, or unspecified chronic kidney disease: Secondary | ICD-10-CM | POA: Insufficient documentation

## 2023-10-20 DIAGNOSIS — E1142 Type 2 diabetes mellitus with diabetic polyneuropathy: Secondary | ICD-10-CM | POA: Diagnosis not present

## 2023-10-20 DIAGNOSIS — K219 Gastro-esophageal reflux disease without esophagitis: Secondary | ICD-10-CM | POA: Diagnosis not present

## 2023-10-20 DIAGNOSIS — Z955 Presence of coronary angioplasty implant and graft: Secondary | ICD-10-CM | POA: Diagnosis not present

## 2023-10-20 DIAGNOSIS — E1122 Type 2 diabetes mellitus with diabetic chronic kidney disease: Secondary | ICD-10-CM | POA: Diagnosis not present

## 2023-10-20 DIAGNOSIS — E1151 Type 2 diabetes mellitus with diabetic peripheral angiopathy without gangrene: Secondary | ICD-10-CM | POA: Insufficient documentation

## 2023-10-20 DIAGNOSIS — Z951 Presence of aortocoronary bypass graft: Secondary | ICD-10-CM | POA: Insufficient documentation

## 2023-10-20 DIAGNOSIS — Z7902 Long term (current) use of antithrombotics/antiplatelets: Secondary | ICD-10-CM | POA: Diagnosis not present

## 2023-10-20 DIAGNOSIS — Z794 Long term (current) use of insulin: Secondary | ICD-10-CM | POA: Diagnosis not present

## 2023-10-20 DIAGNOSIS — E785 Hyperlipidemia, unspecified: Secondary | ICD-10-CM | POA: Diagnosis not present

## 2023-10-20 DIAGNOSIS — I252 Old myocardial infarction: Secondary | ICD-10-CM | POA: Insufficient documentation

## 2023-10-20 DIAGNOSIS — K8010 Calculus of gallbladder with chronic cholecystitis without obstruction: Secondary | ICD-10-CM | POA: Diagnosis not present

## 2023-10-20 DIAGNOSIS — I2510 Atherosclerotic heart disease of native coronary artery without angina pectoris: Secondary | ICD-10-CM | POA: Diagnosis not present

## 2023-10-20 DIAGNOSIS — K8012 Calculus of gallbladder with acute and chronic cholecystitis without obstruction: Secondary | ICD-10-CM | POA: Insufficient documentation

## 2023-10-20 DIAGNOSIS — G8918 Other acute postprocedural pain: Secondary | ICD-10-CM

## 2023-10-20 LAB — POCT I-STAT, CHEM 8
BUN: 15 mg/dL (ref 8–23)
Calcium, Ion: 1.19 mmol/L (ref 1.15–1.40)
Chloride: 101 mmol/L (ref 98–111)
Creatinine, Ser: 1.6 mg/dL — ABNORMAL HIGH (ref 0.61–1.24)
Glucose, Bld: 128 mg/dL — ABNORMAL HIGH (ref 70–99)
HCT: 41 % (ref 39.0–52.0)
Hemoglobin: 13.9 g/dL (ref 13.0–17.0)
Potassium: 4.2 mmol/L (ref 3.5–5.1)
Sodium: 141 mmol/L (ref 135–145)
TCO2: 26 mmol/L (ref 22–32)

## 2023-10-20 LAB — GLUCOSE, CAPILLARY: Glucose-Capillary: 160 mg/dL — ABNORMAL HIGH (ref 70–99)

## 2023-10-20 SURGERY — CHOLECYSTECTOMY, ROBOT-ASSISTED, LAPAROSCOPIC
Anesthesia: General | Site: Abdomen

## 2023-10-20 MED ORDER — OXYCODONE HCL 5 MG PO TABS
5.0000 mg | ORAL_TABLET | Freq: Once | ORAL | Status: AC | PRN
  Administered 2023-10-20: 5 mg via ORAL
  Filled 2023-10-20: qty 1

## 2023-10-20 MED ORDER — EPHEDRINE 5 MG/ML INJ
INTRAVENOUS | Status: AC
  Filled 2023-10-20: qty 5

## 2023-10-20 MED ORDER — PHENYLEPHRINE 80 MCG/ML (10ML) SYRINGE FOR IV PUSH (FOR BLOOD PRESSURE SUPPORT)
PREFILLED_SYRINGE | INTRAVENOUS | Status: AC
Start: 2023-10-20 — End: ?
  Filled 2023-10-20: qty 10

## 2023-10-20 MED ORDER — STERILE WATER FOR IRRIGATION IR SOLN
Status: DC | PRN
  Administered 2023-10-20: 500 mL

## 2023-10-20 MED ORDER — CLOPIDOGREL BISULFATE 75 MG PO TABS: 75.0000 mg | ORAL_TABLET | Freq: Every day | ORAL | Status: DC

## 2023-10-20 MED ORDER — FENTANYL CITRATE (PF) 250 MCG/5ML IJ SOLN
INTRAMUSCULAR | Status: AC
  Filled 2023-10-20: qty 5

## 2023-10-20 MED ORDER — PHENYLEPHRINE HCL-NACL 20-0.9 MG/250ML-% IV SOLN
INTRAVENOUS | Status: AC
Start: 2023-10-20 — End: ?
  Filled 2023-10-20: qty 250

## 2023-10-20 MED ORDER — BUPIVACAINE HCL (PF) 0.5 % IJ SOLN
INTRAMUSCULAR | Status: AC
  Filled 2023-10-20: qty 30

## 2023-10-20 MED ORDER — ACETAMINOPHEN 500 MG PO TABS: 500.0000 mg | ORAL_TABLET | Freq: Four times a day (QID) | ORAL | 0 refills | Status: AC

## 2023-10-20 MED ORDER — HYDROMORPHONE HCL 1 MG/ML IJ SOLN
0.2500 mg | INTRAMUSCULAR | Status: DC | PRN
  Administered 2023-10-20 (×2): 0.25 mg via INTRAVENOUS
  Filled 2023-10-20: qty 0.5

## 2023-10-20 MED ORDER — ONDANSETRON HCL 4 MG/2ML IJ SOLN
INTRAMUSCULAR | Status: AC
  Filled 2023-10-20: qty 2

## 2023-10-20 MED ORDER — EPHEDRINE SULFATE-NACL 50-0.9 MG/10ML-% IV SOSY
PREFILLED_SYRINGE | INTRAVENOUS | Status: DC | PRN
  Administered 2023-10-20 (×3): 5 mg via INTRAVENOUS

## 2023-10-20 MED ORDER — SODIUM CHLORIDE 0.9 % IV SOLN
INTRAVENOUS | Status: AC
  Filled 2023-10-20: qty 2

## 2023-10-20 MED ORDER — DOCUSATE SODIUM 100 MG PO CAPS: 100.0000 mg | ORAL_CAPSULE | Freq: Two times a day (BID) | ORAL | 2 refills | Status: AC

## 2023-10-20 MED ORDER — SUGAMMADEX SODIUM 200 MG/2ML IV SOLN
INTRAVENOUS | Status: DC | PRN
  Administered 2023-10-20: 200 mg via INTRAVENOUS

## 2023-10-20 MED ORDER — SODIUM CHLORIDE 0.9 % IV SOLN
2.0000 g | INTRAVENOUS | Status: AC
  Administered 2023-10-20: 2 g via INTRAVENOUS

## 2023-10-20 MED ORDER — ROCURONIUM BROMIDE 10 MG/ML (PF) SYRINGE
PREFILLED_SYRINGE | INTRAVENOUS | Status: AC
  Filled 2023-10-20: qty 10

## 2023-10-20 MED ORDER — OXYCODONE HCL 5 MG/5ML PO SOLN: 5.0000 mg | Freq: Once | ORAL | Status: AC | PRN

## 2023-10-20 MED ORDER — LIDOCAINE HCL (PF) 2 % IJ SOLN
INTRAMUSCULAR | Status: DC | PRN
  Administered 2023-10-20: 100 mg via INTRADERMAL

## 2023-10-20 MED ORDER — ASPIRIN 81 MG PO TBEC: 81.0000 mg | DELAYED_RELEASE_TABLET | Freq: Every day | ORAL | Status: AC

## 2023-10-20 MED ORDER — LIDOCAINE HCL (PF) 2 % IJ SOLN
INTRAMUSCULAR | Status: AC
  Filled 2023-10-20: qty 5

## 2023-10-20 MED ORDER — ONDANSETRON HCL 4 MG/2ML IJ SOLN
INTRAMUSCULAR | Status: DC | PRN
  Administered 2023-10-20: 4 mg via INTRAVENOUS

## 2023-10-20 MED ORDER — PHENYLEPHRINE HCL-NACL 20-0.9 MG/250ML-% IV SOLN
INTRAVENOUS | Status: DC | PRN
  Administered 2023-10-20: 30 ug/min via INTRAVENOUS

## 2023-10-20 MED ORDER — FENTANYL CITRATE (PF) 250 MCG/5ML IJ SOLN
INTRAMUSCULAR | Status: DC | PRN
  Administered 2023-10-20 (×4): 50 ug via INTRAVENOUS

## 2023-10-20 MED ORDER — CHLORHEXIDINE GLUCONATE CLOTH 2 % EX PADS: 6.0000 | MEDICATED_PAD | Freq: Once | CUTANEOUS | Status: DC

## 2023-10-20 MED ORDER — PHENYLEPHRINE 80 MCG/ML (10ML) SYRINGE FOR IV PUSH (FOR BLOOD PRESSURE SUPPORT)
PREFILLED_SYRINGE | INTRAVENOUS | Status: DC | PRN
  Administered 2023-10-20: 80 ug via INTRAVENOUS
  Administered 2023-10-20: 160 ug via INTRAVENOUS
  Administered 2023-10-20: 80 ug via INTRAVENOUS
  Administered 2023-10-20 (×2): 160 ug via INTRAVENOUS
  Administered 2023-10-20: 80 ug via INTRAVENOUS
  Administered 2023-10-20: 160 ug via INTRAVENOUS

## 2023-10-20 MED ORDER — CHLORHEXIDINE GLUCONATE 0.12 % MT SOLN: 15.0000 mL | Freq: Once | OROMUCOSAL | Status: DC

## 2023-10-20 MED ORDER — BUPIVACAINE HCL (PF) 0.5 % IJ SOLN
INTRAMUSCULAR | Status: DC | PRN
  Administered 2023-10-20: 30 mL

## 2023-10-20 MED ORDER — ONDANSETRON HCL 4 MG/2ML IJ SOLN
4.0000 mg | Freq: Once | INTRAMUSCULAR | Status: AC | PRN
Start: 2023-10-20 — End: 2023-10-20
  Administered 2023-10-20: 4 mg via INTRAVENOUS
  Filled 2023-10-20: qty 2

## 2023-10-20 MED ORDER — PROPOFOL 10 MG/ML IV BOLUS
INTRAVENOUS | Status: DC | PRN
  Administered 2023-10-20: 20 mg via INTRAVENOUS
  Administered 2023-10-20: 140 mg via INTRAVENOUS
  Administered 2023-10-20: 20 mg via INTRAVENOUS

## 2023-10-20 MED ORDER — LACTATED RINGERS IV SOLN: INTRAVENOUS | Status: DC | PRN

## 2023-10-20 MED ORDER — ROCURONIUM BROMIDE 10 MG/ML (PF) SYRINGE
PREFILLED_SYRINGE | INTRAVENOUS | Status: DC | PRN
  Administered 2023-10-20: 10 mg via INTRAVENOUS
  Administered 2023-10-20: 50 mg via INTRAVENOUS

## 2023-10-20 MED ORDER — PROPOFOL 10 MG/ML IV BOLUS
INTRAVENOUS | Status: AC
Start: 2023-10-20 — End: ?
  Filled 2023-10-20: qty 20

## 2023-10-20 MED ORDER — ORAL CARE MOUTH RINSE: 15.0000 mL | Freq: Once | OROMUCOSAL | Status: DC

## 2023-10-20 MED ORDER — PHENYLEPHRINE 80 MCG/ML (10ML) SYRINGE FOR IV PUSH (FOR BLOOD PRESSURE SUPPORT)
PREFILLED_SYRINGE | INTRAVENOUS | Status: AC
  Filled 2023-10-20: qty 10

## 2023-10-20 SURGICAL SUPPLY — 38 items
BLADE SURG 15 STRL LF DISP TIS (BLADE) ×1 IMPLANT
BLADE SURG 15 STRL SS (BLADE) ×1
CAUTERY HOOK MNPLR 1.6 DVNC XI (INSTRUMENTS) ×1 IMPLANT
CHLORAPREP W/TINT 26 (MISCELLANEOUS) ×1 IMPLANT
CLIP LIGATING HEM O LOK PURPLE (MISCELLANEOUS) ×1 IMPLANT
DEFOGGER SCOPE WARMER CLEARIFY (MISCELLANEOUS) ×1 IMPLANT
DERMABOND ADVANCED .7 DNX12 (GAUZE/BANDAGES/DRESSINGS) ×1 IMPLANT
DRAPE ARM DVNC X/XI (DISPOSABLE) ×4 IMPLANT
DRAPE COLUMN DVNC XI (DISPOSABLE) ×1 IMPLANT
ELECT REM PT RETURN 9FT ADLT (ELECTROSURGICAL) ×1
ELECTRODE REM PT RTRN 9FT ADLT (ELECTROSURGICAL) ×1 IMPLANT
FORCEPS BPLR R/ABLATION 8 DVNC (INSTRUMENTS) ×1 IMPLANT
FORCEPS PROGRASP DVNC XI (FORCEP) ×1 IMPLANT
GLOVE BIOGEL PI IND STRL 6.5 (GLOVE) ×2 IMPLANT
GLOVE BIOGEL PI IND STRL 7.0 (GLOVE) ×3 IMPLANT
GLOVE SURG SS PI 6.5 STRL IVOR (GLOVE) ×2 IMPLANT
GOWN STRL REUS W/TWL LRG LVL3 (GOWN DISPOSABLE) ×3 IMPLANT
GRASPER SUT TROCAR 14GX15 (MISCELLANEOUS) ×1 IMPLANT
IRRIGATOR SUCT 8 DISP DVNC XI (IRRIGATION / IRRIGATOR) IMPLANT
KIT TURNOVER KIT A (KITS) ×1 IMPLANT
NDL HYPO 21X1.5 SAFETY (NEEDLE) ×1 IMPLANT
NEEDLE HYPO 21X1.5 SAFETY (NEEDLE) ×1
OBTURATOR OPTICAL STND 8 DVNC (TROCAR) ×1
OBTURATOR OPTICALSTD 8 DVNC (TROCAR) ×1 IMPLANT
PACK LAP CHOLE LZT030E (CUSTOM PROCEDURE TRAY) ×1 IMPLANT
PAD ARMBOARD 7.5X6 YLW CONV (MISCELLANEOUS) ×1 IMPLANT
PENCIL HANDSWITCHING (ELECTRODE) IMPLANT
POSITIONER HEAD 8X9X4 ADT (SOFTGOODS) ×1 IMPLANT
SEAL UNIV 5-12 XI (MISCELLANEOUS) ×3 IMPLANT
SET BASIN LINEN APH (SET/KITS/TRAYS/PACK) ×1 IMPLANT
SET TUBE DA VINCI INSUFFLATOR (TUBING) IMPLANT
SUT MNCRL AB 4-0 PS2 18 (SUTURE) ×2 IMPLANT
SUT VICRYL 0 AB UR-6 (SUTURE) IMPLANT
SYR 30ML LL (SYRINGE) ×1 IMPLANT
SYS RETRIEVAL 5MM INZII UNIV (BASKET) ×1
SYSTEM RETRIEVL 5MM INZII UNIV (BASKET) IMPLANT
WATER STERILE IRR 500ML POUR (IV SOLUTION) ×1 IMPLANT
YANKAUER SUCT BULB TIP 10FT TU (MISCELLANEOUS) IMPLANT

## 2023-10-20 NOTE — Op Note (Signed)
[facility] Operative Note  10/20/23  Preoperative Diagnosis: Symptomatic Cholelithiasis   Postoperative Diagnosis: Same   Procedure(s) Performed: Robotic Assisted Laparoscopic Cholecystectomy   Surgeon: Theophilus Kinds, DO   Assistants: No qualified resident was available    Anesthesia: General endotracheal   Anesthesiologist: Ronelle Nigh, MD    Specimens: Gallbladder   Estimated Blood Loss: Minimal   Blood Replacement: None    Complications: None   Wound Class: Clean contaminated   Operative Indications: The patient was found to have cholelithiasis on imaging and was symptomatic.  We discussed the risk of the procedure including but not limited to bleeding, infection, injury to the common bile duct, bile leak, need for further procedures, chance of subtotal cholecystectomy.   Findings:  Minimally inflamed gallbladder Critical view of safety noted All clips intact at the end of the case Adequate hemostasis   Procedure: The patient was taken to the operating room and placed supine. General endotracheal anesthesia was induced. Intravenous antibiotics were administered per protocol.  An orogastric tube positioned to decompress the stomach. The abdomen was prepared and draped in the usual sterile fashion. A time-out was completed verifying correct patient, procedure, site, positioning, and implant(s) and/or special equipment prior to beginning this procedure.  Veress needle was placed at the infraumbilical area and insufflation was started after confirming a positive saline drop test and no immediate increase in abdominal pressure.  After reaching 15 mm, the Veress needle was removed and a 8 mm port was placed via optiview technique infraumbilical, measuring 20 mm away from the suspected position of the gallbladder.  The abdomen was inspected and no abnormalities or injuries were found.  Under direct vision, ports were placed in the following locations  in a semi curvilinear position around the target of the gallbladder: Two 8 mm ports on the patient's right each having 8cm clearance to the adjacent ports and one 8 mm port placed on the patient's left 8 cm from the umbilical port. Once ports were placed, the table was placed in the reverse Trendelenburg position with the right side up. The Xi platform was brought into the operative field and docked to the ports successfully.  An endoscope was placed through the umbilical port, prograsp through the most lateral right port, fenestrated bipolar to the port just right of the umbilicus, and then a hook cautery in the left port.  The dome of the gallbladder was grasped with prograsp and retracted over the dome of the liver. Adhesions between the gallbladder and omentum, duodenum and transverse colon were lysed via hook cautery. The infundibulum was grasped with the fenestrated grasper and retracted toward the right lower quadrant. This maneuver exposed Calot's triangle.  The peritoneum overlying the gallbladder infundibulum was then dissected and the cystic duct and cystic artery identified.  Critical view of safety with the liver bed clearly visible behind the duct and artery with no additional structures noted.  The cystic duct and cystic artery were doubly clipped and divided close to the gallbladder.    The gallbladder was then dissected from its peritoneal and liver bed attachments by electrocautery. Hemostasis was checked prior to removing the hook cautery.  The [employer] was undocked and moved out of the field.  A 5mm Endo Catch bag was then placed through the umbilical port and the gallbladder was removed.  The gallbladder was passed off the table as a specimen. There was no evidence of bleeding from the gallbladder fossa or cystic artery or leakage of the bile from  the cystic duct stump. The umbilical port site closed with a 0 vicryl with a PMI needle.  The abdomen was desufflated and secondary trocars were removed  under direct vision. No bleeding was noted. Incisions were localized with marcaine.  All skin incisions were closed with subcuticular sutures of 4-0 monocryl and dermabond.   Final inspection revealed acceptable hemostasis. All counts were correct at the end of the case. The patient was awakened from anesthesia and extubated without complication. The OG tube was removed.  The patient went to the PACU in stable condition.   Theophilus Kinds, DO [facility] [address] [place] [place], [place] [address] [phone] (office)

## 2023-10-20 NOTE — Interval H&P Note (Signed)
History and Physical Interval Note:  10/20/2023 11:10 AM  Derrick [name]  has presented today for surgery, with the diagnosis of CHOLELITHIASIS.  The various methods of treatment have been discussed with the patient and family. After consideration of risks, benefits and other options for treatment, the patient has consented to  Procedure(s): XI ROBOTIC ASSISTED LAPAROSCOPIC CHOLECYSTECTOMY (N/A) as a surgical intervention.  The patient's history has been reviewed, patient examined, no change in status, stable for surgery.  I have reviewed the patient's chart and labs.  Questions were answered to the patient's satisfaction.     Keanan Melander A Luisdavid Hamblin

## 2023-10-20 NOTE — Discharge Instructions (Signed)
Ambulatory Surgery Discharge Instructions  General Anesthesia or Sedation Do not drive or operate heavy machinery for 24 hours.  Do not consume alcohol, tranquilizers, sleeping medications, or any non-prescribed medications for 24 hours. Do not make important decisions or sign any important papers in the next 24 hours. You should have someone with you tonight at home.  Activity  You are advised to go directly home from the hospital.  Restrict your activities and rest for a day.  Resume light activity tomorrow. No heavy lifting over 10 lbs or strenuous exercise.  Fluids and Diet Begin with clear liquids, bouillon, dry toast, soda crackers.  If not nauseated, you may go to a regular diet when you desire.  Greasy and spicy foods are not advised.  Medications  If you have not had a bowel movement in 24 hours, take 2 tablespoons over the counter Milk of mag.             You May resume your blood thinners in 2 days (Aspirin, coumadin, or other).  Resume your Plavix in 3 days.  Operative Site  You have a liquid bandage over your incisions, this will begin to flake off in about a week. Ok to English as a second language teacher. Keep wound clean and dry. No baths or swimming. No lifting more than 10 pounds.  Contact Information: If you have questions or concerns, please call our office, [phone], Monday- Thursday 8AM-5PM and Friday 8AM-12Noon.  If it is after hours or on the weekend, please call [facility], [phone], and ask to speak to the surgeon on call for Dr. Robyne Peers at [facility].   SPECIFIC COMPLICATIONS TO WATCH FOR: Inability to urinate Fever over 101? F by mouth Nausea and vomiting lasting longer than 24 hours. Pain not relieved by medication ordered Swelling around the operative site Increased redness, warmth, hardness, around operative area Numbness, tingling, or cold fingers or toes Blood -soaked dressing, (small amounts of oozing may be normal) Increasing and progressive  drainage from surgical area or exam site

## 2023-10-20 NOTE — Progress Notes (Signed)
[facility]  Spoke with the patient's family in the consultation room.  I explained that he tolerated the procedure without difficulty.  He has dissolvable stitches under the skin with overlying skin glue.  This will flake off in 10 to 14 days.  I also want him taking scheduled Tylenol.  If they take the narcotic pain medication, they should take a stool softener as well.  The patient will follow-up with me in 2 weeks for phone follow-up.  All questions were answered to their expressed satisfaction.  Theophilus Kinds, DO [facility] [address] [place] [place], [place] [address] [phone] (office)

## 2023-10-20 NOTE — Transfer of Care (Signed)
Immediate Anesthesia Transfer of Care Note  Patient: Derrick [name]  Procedure(s) Performed: XI ROBOTIC ASSISTED LAPAROSCOPIC CHOLECYSTECTOMY (Abdomen)  Patient Location: PACU  Anesthesia Type:General  Level of Consciousness: drowsy  Airway & Oxygen Therapy: Patient Spontanous Breathing and Patient connected to face mask oxygen  Post-op Assessment: Report given to RN and Post -op Vital signs reviewed and stable  Post vital signs: Reviewed and stable  Last Vitals:  Vitals Value Taken Time  BP 175/80 10/20/23 1445  Temp    Pulse 71 10/20/23 1446  Resp 13 10/20/23 1446  SpO2 98 % 10/20/23 1446  Vitals shown include unfiled device data.  Last Pain:  Vitals:   10/20/23 1208  TempSrc: Oral  PainSc: 0-No pain         Complications:  Encounter Notable Events  Notable Event Outcome Phase Comment  Difficult to intubate - expected  Intraprocedure Filed from anesthesia note documentation.

## 2023-10-20 NOTE — Anesthesia Procedure Notes (Signed)
Procedure Name: Intubation Date/Time: 10/20/2023 12:34 PM  Performed by: Julian Reil, CRNAPre-anesthesia Checklist: Patient identified, Emergency Drugs available, Suction available and Patient being monitored Patient Re-evaluated:Patient Re-evaluated prior to induction Oxygen Delivery Method: Circle system utilized Preoxygenation: Pre-oxygenation with 100% oxygen Induction Type: IV induction Ventilation: Mask ventilation without difficulty and Oral airway inserted - appropriate to patient size Laryngoscope Size: Glidescope and 3 Grade View: Grade I Tube type: Oral Tube size: 7.5 mm Number of attempts: 1 Airway Equipment and Method: Stylet, Oral airway and Video-laryngoscopy Placement Confirmation: ETT inserted through vocal cords under direct vision, positive ETCO2 and breath sounds checked- equal and bilateral Secured at: 22 cm Tube secured with: Tape Dental Injury: Teeth and Oropharynx as per pre-operative assessment  Difficulty Due To: Difficult Airway- due to reduced neck mobility and Difficulty was anticipated Comments: Glidescope electively used due to limited ROM of neck due to H/O cervical neck fusion.

## 2023-10-20 NOTE — Anesthesia Preprocedure Evaluation (Addendum)
Anesthesia Evaluation  Patient identified by MRN, date of birth, ID band Patient awake    Reviewed: Allergy & Precautions, NPO status , Patient's Chart, lab work & pertinent test results  History of Anesthesia Complications Negative for: history of anesthetic complications  Airway Mallampati: II  TM Distance: >3 FB Neck ROM: Full    Dental  (+) Edentulous Upper, Edentulous Lower   Pulmonary COPD, former smoker   breath sounds clear to auscultation       Cardiovascular hypertension, Pt. on medications and Pt. on home beta blockers + angina  + CAD, + Past MI, + Cardiac Stents, + CABG and + Peripheral Vascular Disease  Normal cardiovascular exam+ Valvular Problems/Murmurs MR  Rhythm:Regular Rate:Normal     Neuro/Psych  Neuromuscular disease  negative psych ROS   GI/Hepatic Neg liver ROS,GERD  Controlled,,  Endo/Other  diabetes, Poorly Controlled, Type 2, Insulin Dependent    Renal/GU Renal InsufficiencyRenal disease     Musculoskeletal   Abdominal Normal abdominal exam  (+)   Peds  Hematology  (+) Blood dyscrasia, anemia   Anesthesia Other Findings   Reproductive/Obstetrics                              Anesthesia Physical Anesthesia Plan  ASA: 3  Anesthesia Plan: General   Post-op Pain Management: Dilaudid IV   Induction: Intravenous  PONV Risk Score and Plan: Ondansetron, Dexamethasone and Midazolam  Airway Management Planned: Oral ETT  Additional Equipment: None  Intra-op Plan:   Post-operative Plan: Extubation in OR  Informed Consent: I have reviewed the patients History and Physical, chart, labs and discussed the procedure including the risks, benefits and alternatives for the proposed anesthesia with the patient or authorized representative who has indicated his/her understanding and acceptance.     Dental advisory given  Plan Discussed with: CRNA  Anesthesia Plan  Comments:         Anesthesia Quick Evaluation

## 2023-10-20 NOTE — Anesthesia Postprocedure Evaluation (Signed)
Anesthesia Post Note  Patient: Derrick [name]  Procedure(s) Performed: XI ROBOTIC ASSISTED LAPAROSCOPIC CHOLECYSTECTOMY (Abdomen)  Patient location during evaluation: PACU Anesthesia Type: General Level of consciousness: awake and alert Pain management: pain level controlled Vital Signs Assessment: post-procedure vital signs reviewed and stable Respiratory status: spontaneous breathing, nonlabored ventilation, respiratory function stable and patient connected to nasal cannula oxygen Cardiovascular status: blood pressure returned to baseline and stable Postop Assessment: no apparent nausea or vomiting Anesthetic complications: yes   Encounter Notable Events  Notable Event Outcome Phase Comment  Difficult to intubate - expected  Intraprocedure Filed from anesthesia note documentation.     Last Vitals:  Vitals:   10/20/23 1445 10/20/23 1500  BP: (!) 175/80 (!) 145/74  Pulse: 73 75  Resp: (!) 8 12  Temp: (!) 36.3 C   SpO2: 99% 100%    Last Pain:  Vitals:   10/20/23 1500  TempSrc:   PainSc: 6                  [name]

## 2023-10-24 LAB — SURGICAL PATHOLOGY

## 2023-10-25 DIAGNOSIS — E119 Type 2 diabetes mellitus without complications: Secondary | ICD-10-CM | POA: Diagnosis not present

## 2023-11-01 ENCOUNTER — Ambulatory Visit (INDEPENDENT_AMBULATORY_CARE_PROVIDER_SITE_OTHER): Payer: Medicare Other | Admitting: Surgery

## 2023-11-01 DIAGNOSIS — Z09 Encounter for follow-up examination after completed treatment for conditions other than malignant neoplasm: Secondary | ICD-10-CM

## 2023-11-01 NOTE — Progress Notes (Signed)
[facility]  I am calling the patient for post operative evaluation. This is not a billable encounter as it is under the global charges for the surgery.  The patient had a robotic assisted laparoscopic cholecystectomy on 11/15. The patient reports that he is doing well. He is tolerating a diet, having good pain control, and having regular Bms.  The incisions are healing well.  Most of the skin glue has come off.  Advised that he can use antibiotic ointment prior to showering help loosen the remaining glue to helpwith removal. The patient has no concerns.   Pathology: A. GALLBLADDER, CHOLELITHIASIS:  - Chronic cholecystitis and cholelithiasis   Will see the patient PRN.   Theophilus Kinds, DO [facility] [address] [place], [place] [address] [phone] (office)

## 2023-11-04 DIAGNOSIS — Z9181 History of falling: Secondary | ICD-10-CM | POA: Diagnosis not present

## 2023-11-04 DIAGNOSIS — G629 Polyneuropathy, unspecified: Secondary | ICD-10-CM | POA: Diagnosis not present

## 2023-11-04 DIAGNOSIS — Z6821 Body mass index (BMI) 21.0-21.9, adult: Secondary | ICD-10-CM | POA: Diagnosis not present

## 2023-11-04 DIAGNOSIS — Z79899 Other long term (current) drug therapy: Secondary | ICD-10-CM | POA: Diagnosis not present

## 2023-11-04 DIAGNOSIS — E11618 Type 2 diabetes mellitus with other diabetic arthropathy: Secondary | ICD-10-CM | POA: Diagnosis not present

## 2023-11-07 DIAGNOSIS — I2510 Atherosclerotic heart disease of native coronary artery without angina pectoris: Secondary | ICD-10-CM | POA: Diagnosis not present

## 2023-11-07 DIAGNOSIS — Z955 Presence of coronary angioplasty implant and graft: Secondary | ICD-10-CM | POA: Diagnosis not present

## 2023-11-07 DIAGNOSIS — T466X5A Adverse effect of antihyperlipidemic and antiarteriosclerotic drugs, initial encounter: Secondary | ICD-10-CM | POA: Diagnosis not present

## 2023-11-07 DIAGNOSIS — E1140 Type 2 diabetes mellitus with diabetic neuropathy, unspecified: Secondary | ICD-10-CM | POA: Diagnosis not present

## 2023-11-07 DIAGNOSIS — Z0000 Encounter for general adult medical examination without abnormal findings: Secondary | ICD-10-CM | POA: Diagnosis not present

## 2023-11-07 DIAGNOSIS — C4490 Unspecified malignant neoplasm of skin, unspecified: Secondary | ICD-10-CM | POA: Diagnosis not present

## 2023-11-07 DIAGNOSIS — I10 Essential (primary) hypertension: Secondary | ICD-10-CM | POA: Diagnosis not present

## 2023-11-07 DIAGNOSIS — J432 Centrilobular emphysema: Secondary | ICD-10-CM | POA: Diagnosis not present

## 2023-11-07 DIAGNOSIS — E7800 Pure hypercholesterolemia, unspecified: Secondary | ICD-10-CM | POA: Diagnosis not present

## 2023-11-07 DIAGNOSIS — I700 Atherosclerosis of aorta: Secondary | ICD-10-CM | POA: Diagnosis not present

## 2023-11-07 DIAGNOSIS — G720 Drug-induced myopathy: Secondary | ICD-10-CM | POA: Diagnosis not present

## 2023-11-07 DIAGNOSIS — K219 Gastro-esophageal reflux disease without esophagitis: Secondary | ICD-10-CM | POA: Diagnosis not present

## 2023-11-08 LAB — LAB REPORT - SCANNED
A1c: 8.6
EGFR: 38

## 2023-11-10 ENCOUNTER — Other Ambulatory Visit: Payer: Self-pay | Admitting: Cardiology

## 2023-11-15 ENCOUNTER — Other Ambulatory Visit: Payer: Self-pay | Admitting: Cardiovascular Disease

## 2023-11-17 ENCOUNTER — Ambulatory Visit (HOSPITAL_COMMUNITY)
Admission: RE | Admit: 2023-11-17 | Discharge: 2023-11-17 | Disposition: A | Discharge status: Home / Self Care | Payer: Medicare Other | Source: Ambulatory Visit | Attending: Cardiology | Admitting: Cardiology

## 2023-11-17 ENCOUNTER — Ambulatory Visit (HOSPITAL_BASED_OUTPATIENT_CLINIC_OR_DEPARTMENT_OTHER)
Admission: RE | Admit: 2023-11-17 | Discharge: 2023-11-17 | Disposition: A | Discharge status: Home / Self Care | Payer: Medicare Other | Source: Ambulatory Visit | Attending: Cardiology

## 2023-11-17 ENCOUNTER — Ambulatory Visit (HOSPITAL_BASED_OUTPATIENT_CLINIC_OR_DEPARTMENT_OTHER)
Admission: RE | Admit: 2023-11-17 | Discharge: 2023-11-17 | Disposition: A | Discharge status: Home / Self Care | Payer: Medicare Other | Source: Ambulatory Visit | Attending: Cardiology | Admitting: Cardiology

## 2023-11-17 DIAGNOSIS — I2510 Atherosclerotic heart disease of native coronary artery without angina pectoris: Secondary | ICD-10-CM | POA: Insufficient documentation

## 2023-11-17 DIAGNOSIS — I6521 Occlusion and stenosis of right carotid artery: Secondary | ICD-10-CM | POA: Diagnosis not present

## 2023-11-17 DIAGNOSIS — I739 Peripheral vascular disease, unspecified: Secondary | ICD-10-CM | POA: Diagnosis not present

## 2023-11-17 DIAGNOSIS — Z95820 Peripheral vascular angioplasty status with implants and grafts: Secondary | ICD-10-CM | POA: Insufficient documentation

## 2023-11-18 LAB — VAS US ABI WITH/WO TBI
Left ABI: 0.78
Right ABI: 0.58

## 2023-11-21 ENCOUNTER — Telehealth: Payer: Self-pay

## 2023-11-21 DIAGNOSIS — I739 Peripheral vascular disease, unspecified: Secondary | ICD-10-CM

## 2023-11-21 DIAGNOSIS — I2510 Atherosclerotic heart disease of native coronary artery without angina pectoris: Secondary | ICD-10-CM

## 2023-11-21 NOTE — Telephone Encounter (Signed)
-----   Message from Jonita Albee sent at 11/21/2023  1:00 PM EST ----- Please tell patient that the ultrasounds of their carotid arteries showed 60-79% stenosis (narrowing) in the right internal carotid artery and 1-39% stenosis in the left internal carotid artery. Stable when compared to study from 03/2022 without significant changes noted. Recommend repeat study in 12 months for monitoring   Thanks  KJ

## 2023-11-21 NOTE — Telephone Encounter (Signed)
-----   Message from Jonita Albee sent at 11/21/2023  1:02 PM EST ----- Please tell patient that his ABIs suggest moderate right and left lower extremity arterial disease. ABIs essentially unchanged when compared to studies from 03/2022.   Recommend repeat study in 12 months   Thanks KJ

## 2023-11-21 NOTE — Telephone Encounter (Signed)
Called patient advised of below they verbalized understanding Ordered test from below and sent message to scheduleing

## 2023-11-21 NOTE — Telephone Encounter (Signed)
-----   Message from Jonita Albee sent at 11/21/2023  1:04 PM EST ----- Please tell patient that the ultrasound of their abdominal aorta showed no changes when compared to study from 03/2022. Recommend repeating the study in 12 months.  Thanks  [employer]

## 2023-11-24 DIAGNOSIS — D0439 Carcinoma in situ of skin of other parts of face: Secondary | ICD-10-CM | POA: Diagnosis not present

## 2023-12-04 DIAGNOSIS — Z79899 Other long term (current) drug therapy: Secondary | ICD-10-CM | POA: Diagnosis not present

## 2023-12-04 DIAGNOSIS — G629 Polyneuropathy, unspecified: Secondary | ICD-10-CM | POA: Diagnosis not present

## 2023-12-04 DIAGNOSIS — Z9181 History of falling: Secondary | ICD-10-CM | POA: Diagnosis not present

## 2023-12-05 DIAGNOSIS — E1140 Type 2 diabetes mellitus with diabetic neuropathy, unspecified: Secondary | ICD-10-CM | POA: Diagnosis not present

## 2023-12-05 DIAGNOSIS — Z794 Long term (current) use of insulin: Secondary | ICD-10-CM | POA: Diagnosis not present

## 2023-12-05 DIAGNOSIS — I252 Old myocardial infarction: Secondary | ICD-10-CM | POA: Diagnosis not present

## 2023-12-05 DIAGNOSIS — I10 Essential (primary) hypertension: Secondary | ICD-10-CM | POA: Diagnosis not present

## 2023-12-05 DIAGNOSIS — E7800 Pure hypercholesterolemia, unspecified: Secondary | ICD-10-CM | POA: Diagnosis not present

## 2023-12-05 DIAGNOSIS — Z79899 Other long term (current) drug therapy: Secondary | ICD-10-CM | POA: Diagnosis not present

## 2023-12-06 DIAGNOSIS — E1140 Type 2 diabetes mellitus with diabetic neuropathy, unspecified: Secondary | ICD-10-CM | POA: Diagnosis not present

## 2023-12-22 ENCOUNTER — Telehealth: Payer: Self-pay | Admitting: Cardiovascular Disease

## 2023-12-22 MED ORDER — CLOPIDOGREL BISULFATE 75 MG PO TABS: 75.0000 mg | ORAL_TABLET | Freq: Every day | ORAL | 3 refills | Status: DC

## 2023-12-22 NOTE — Telephone Encounter (Signed)
 *  STAT* If patient is at the pharmacy, call can be transferred to refill team.   1. Which medications need to be refilled? (please list name of each medication and dose if known) clopidogrel (PLAVIX) 75 MG tablet    2. Would you like to learn more about the convenience, safety, & potential cost savings by using the [facility]?    3. Are you open to using the [facility] (Type [facility].    4. Which pharmacy/location (including street and city if local pharmacy) is medication to be sent to?  [facility] [address] [place] - [address]     5. Do they need a 30 day or 90 day supply? 90 days

## 2023-12-22 NOTE — Telephone Encounter (Signed)
Pt's medication was sent to pt's pharmacy as requested. Confirmation received.  °

## 2023-12-30 DIAGNOSIS — Z0000 Encounter for general adult medical examination without abnormal findings: Secondary | ICD-10-CM | POA: Diagnosis not present

## 2023-12-30 DIAGNOSIS — Z9181 History of falling: Secondary | ICD-10-CM | POA: Diagnosis not present

## 2023-12-30 DIAGNOSIS — G629 Polyneuropathy, unspecified: Secondary | ICD-10-CM | POA: Diagnosis not present

## 2023-12-30 DIAGNOSIS — E11618 Type 2 diabetes mellitus with other diabetic arthropathy: Secondary | ICD-10-CM | POA: Diagnosis not present

## 2023-12-30 DIAGNOSIS — Z6821 Body mass index (BMI) 21.0-21.9, adult: Secondary | ICD-10-CM | POA: Diagnosis not present

## 2023-12-30 DIAGNOSIS — Z79899 Other long term (current) drug therapy: Secondary | ICD-10-CM | POA: Diagnosis not present

## 2024-01-02 DIAGNOSIS — B351 Tinea unguium: Secondary | ICD-10-CM | POA: Diagnosis not present

## 2024-01-02 DIAGNOSIS — L84 Corns and callosities: Secondary | ICD-10-CM | POA: Diagnosis not present

## 2024-01-02 DIAGNOSIS — E1142 Type 2 diabetes mellitus with diabetic polyneuropathy: Secondary | ICD-10-CM | POA: Diagnosis not present

## 2024-01-02 DIAGNOSIS — M79676 Pain in unspecified toe(s): Secondary | ICD-10-CM | POA: Diagnosis not present

## 2024-01-05 DIAGNOSIS — Z08 Encounter for follow-up examination after completed treatment for malignant neoplasm: Secondary | ICD-10-CM | POA: Diagnosis not present

## 2024-01-05 DIAGNOSIS — X32XXXD Exposure to sunlight, subsequent encounter: Secondary | ICD-10-CM | POA: Diagnosis not present

## 2024-01-05 DIAGNOSIS — Z85828 Personal history of other malignant neoplasm of skin: Secondary | ICD-10-CM | POA: Diagnosis not present

## 2024-01-05 DIAGNOSIS — L570 Actinic keratosis: Secondary | ICD-10-CM | POA: Diagnosis not present

## 2024-01-16 DIAGNOSIS — I252 Old myocardial infarction: Secondary | ICD-10-CM | POA: Diagnosis not present

## 2024-01-16 DIAGNOSIS — T466X5A Adverse effect of antihyperlipidemic and antiarteriosclerotic drugs, initial encounter: Secondary | ICD-10-CM | POA: Diagnosis not present

## 2024-01-16 DIAGNOSIS — I10 Essential (primary) hypertension: Secondary | ICD-10-CM | POA: Diagnosis not present

## 2024-01-16 DIAGNOSIS — M791 Myalgia, unspecified site: Secondary | ICD-10-CM | POA: Diagnosis not present

## 2024-01-16 DIAGNOSIS — Z794 Long term (current) use of insulin: Secondary | ICD-10-CM | POA: Diagnosis not present

## 2024-01-16 DIAGNOSIS — E1140 Type 2 diabetes mellitus with diabetic neuropathy, unspecified: Secondary | ICD-10-CM | POA: Diagnosis not present

## 2024-01-16 DIAGNOSIS — E7800 Pure hypercholesterolemia, unspecified: Secondary | ICD-10-CM | POA: Diagnosis not present

## 2024-01-17 ENCOUNTER — Other Ambulatory Visit: Payer: Self-pay | Admitting: Cardiology

## 2024-01-25 DIAGNOSIS — E119 Type 2 diabetes mellitus without complications: Secondary | ICD-10-CM | POA: Diagnosis not present

## 2024-01-27 DIAGNOSIS — Z79899 Other long term (current) drug therapy: Secondary | ICD-10-CM | POA: Diagnosis not present

## 2024-01-27 DIAGNOSIS — E119 Type 2 diabetes mellitus without complications: Secondary | ICD-10-CM | POA: Diagnosis not present

## 2024-01-27 DIAGNOSIS — Z9181 History of falling: Secondary | ICD-10-CM | POA: Diagnosis not present

## 2024-01-27 DIAGNOSIS — Z6821 Body mass index (BMI) 21.0-21.9, adult: Secondary | ICD-10-CM | POA: Diagnosis not present

## 2024-01-27 DIAGNOSIS — G629 Polyneuropathy, unspecified: Secondary | ICD-10-CM | POA: Diagnosis not present

## 2024-02-02 DIAGNOSIS — E119 Type 2 diabetes mellitus without complications: Secondary | ICD-10-CM | POA: Diagnosis not present

## 2024-02-06 DIAGNOSIS — I252 Old myocardial infarction: Secondary | ICD-10-CM | POA: Diagnosis not present

## 2024-02-06 DIAGNOSIS — I10 Essential (primary) hypertension: Secondary | ICD-10-CM | POA: Diagnosis not present

## 2024-02-06 DIAGNOSIS — E1140 Type 2 diabetes mellitus with diabetic neuropathy, unspecified: Secondary | ICD-10-CM | POA: Diagnosis not present

## 2024-02-06 DIAGNOSIS — E7800 Pure hypercholesterolemia, unspecified: Secondary | ICD-10-CM | POA: Diagnosis not present

## 2024-02-06 DIAGNOSIS — Z794 Long term (current) use of insulin: Secondary | ICD-10-CM | POA: Diagnosis not present

## 2024-02-06 DIAGNOSIS — T466X5A Adverse effect of antihyperlipidemic and antiarteriosclerotic drugs, initial encounter: Secondary | ICD-10-CM | POA: Diagnosis not present

## 2024-02-06 DIAGNOSIS — M791 Myalgia, unspecified site: Secondary | ICD-10-CM | POA: Diagnosis not present

## 2024-02-24 DIAGNOSIS — Z9181 History of falling: Secondary | ICD-10-CM | POA: Diagnosis not present

## 2024-02-24 DIAGNOSIS — Z79899 Other long term (current) drug therapy: Secondary | ICD-10-CM | POA: Diagnosis not present

## 2024-02-24 DIAGNOSIS — R030 Elevated blood-pressure reading, without diagnosis of hypertension: Secondary | ICD-10-CM | POA: Diagnosis not present

## 2024-02-24 DIAGNOSIS — Z6821 Body mass index (BMI) 21.0-21.9, adult: Secondary | ICD-10-CM | POA: Diagnosis not present

## 2024-02-24 DIAGNOSIS — G629 Polyneuropathy, unspecified: Secondary | ICD-10-CM | POA: Diagnosis not present

## 2024-03-01 ENCOUNTER — Other Ambulatory Visit: Payer: Self-pay | Admitting: Cardiovascular Disease

## 2024-03-04 ENCOUNTER — Other Ambulatory Visit: Payer: Self-pay | Admitting: Cardiovascular Disease

## 2024-03-12 DIAGNOSIS — M79676 Pain in unspecified toe(s): Secondary | ICD-10-CM | POA: Diagnosis not present

## 2024-03-12 DIAGNOSIS — E1142 Type 2 diabetes mellitus with diabetic polyneuropathy: Secondary | ICD-10-CM | POA: Diagnosis not present

## 2024-03-12 DIAGNOSIS — L84 Corns and callosities: Secondary | ICD-10-CM | POA: Diagnosis not present

## 2024-03-12 DIAGNOSIS — B351 Tinea unguium: Secondary | ICD-10-CM | POA: Diagnosis not present

## 2024-03-19 DIAGNOSIS — M791 Myalgia, unspecified site: Secondary | ICD-10-CM | POA: Diagnosis not present

## 2024-03-19 DIAGNOSIS — T466X5A Adverse effect of antihyperlipidemic and antiarteriosclerotic drugs, initial encounter: Secondary | ICD-10-CM | POA: Diagnosis not present

## 2024-03-19 DIAGNOSIS — I10 Essential (primary) hypertension: Secondary | ICD-10-CM | POA: Diagnosis not present

## 2024-03-19 DIAGNOSIS — E1140 Type 2 diabetes mellitus with diabetic neuropathy, unspecified: Secondary | ICD-10-CM | POA: Diagnosis not present

## 2024-03-19 DIAGNOSIS — I252 Old myocardial infarction: Secondary | ICD-10-CM | POA: Diagnosis not present

## 2024-03-19 DIAGNOSIS — E7800 Pure hypercholesterolemia, unspecified: Secondary | ICD-10-CM | POA: Diagnosis not present

## 2024-03-19 DIAGNOSIS — Z794 Long term (current) use of insulin: Secondary | ICD-10-CM | POA: Diagnosis not present

## 2024-03-26 DIAGNOSIS — G629 Polyneuropathy, unspecified: Secondary | ICD-10-CM | POA: Diagnosis not present

## 2024-03-26 DIAGNOSIS — Z79899 Other long term (current) drug therapy: Secondary | ICD-10-CM | POA: Diagnosis not present

## 2024-03-26 DIAGNOSIS — Z9181 History of falling: Secondary | ICD-10-CM | POA: Diagnosis not present

## 2024-03-29 DIAGNOSIS — Z79899 Other long term (current) drug therapy: Secondary | ICD-10-CM | POA: Diagnosis not present

## 2024-04-05 DIAGNOSIS — E1140 Type 2 diabetes mellitus with diabetic neuropathy, unspecified: Secondary | ICD-10-CM | POA: Diagnosis not present

## 2024-04-12 DIAGNOSIS — Z08 Encounter for follow-up examination after completed treatment for malignant neoplasm: Secondary | ICD-10-CM | POA: Diagnosis not present

## 2024-04-12 DIAGNOSIS — L820 Inflamed seborrheic keratosis: Secondary | ICD-10-CM | POA: Diagnosis not present

## 2024-04-12 DIAGNOSIS — Z85828 Personal history of other malignant neoplasm of skin: Secondary | ICD-10-CM | POA: Diagnosis not present

## 2024-04-15 ENCOUNTER — Ambulatory Visit: Payer: Medicare Other | Attending: Cardiovascular Disease | Admitting: Cardiovascular Disease

## 2024-04-16 ENCOUNTER — Encounter: Payer: Self-pay | Admitting: Cardiovascular Disease

## 2024-04-26 DIAGNOSIS — E119 Type 2 diabetes mellitus without complications: Secondary | ICD-10-CM | POA: Diagnosis not present

## 2024-05-21 ENCOUNTER — Ambulatory Visit: Attending: Cardiovascular Disease | Admitting: Cardiovascular Disease

## 2024-05-21 ENCOUNTER — Encounter: Payer: Self-pay | Admitting: Cardiovascular Disease

## 2024-05-21 VITALS — BP 128/70 | HR 72 | Ht 66.0 in | Wt 129.0 lb

## 2024-05-21 DIAGNOSIS — E785 Hyperlipidemia, unspecified: Secondary | ICD-10-CM | POA: Insufficient documentation

## 2024-05-21 DIAGNOSIS — B351 Tinea unguium: Secondary | ICD-10-CM | POA: Diagnosis not present

## 2024-05-21 DIAGNOSIS — I2510 Atherosclerotic heart disease of native coronary artery without angina pectoris: Secondary | ICD-10-CM

## 2024-05-21 DIAGNOSIS — Z959 Presence of cardiac and vascular implant and graft, unspecified: Secondary | ICD-10-CM | POA: Diagnosis not present

## 2024-05-21 DIAGNOSIS — I739 Peripheral vascular disease, unspecified: Secondary | ICD-10-CM | POA: Diagnosis not present

## 2024-05-21 DIAGNOSIS — E782 Mixed hyperlipidemia: Secondary | ICD-10-CM

## 2024-05-21 DIAGNOSIS — L84 Corns and callosities: Secondary | ICD-10-CM | POA: Diagnosis not present

## 2024-05-21 DIAGNOSIS — I6521 Occlusion and stenosis of right carotid artery: Secondary | ICD-10-CM

## 2024-05-21 DIAGNOSIS — M79675 Pain in left toe(s): Secondary | ICD-10-CM | POA: Diagnosis not present

## 2024-05-21 DIAGNOSIS — M79674 Pain in right toe(s): Secondary | ICD-10-CM | POA: Diagnosis not present

## 2024-05-21 DIAGNOSIS — E1142 Type 2 diabetes mellitus with diabetic polyneuropathy: Secondary | ICD-10-CM | POA: Diagnosis not present

## 2024-05-21 NOTE — Patient Instructions (Signed)
 Medication Instructions:  Your physician recommends that you continue on your current medications as directed. Please refer to the Current Medication list given to you today.  *If you need a refill on your cardiac medications before your next appointment, please call your pharmacy*   Testing/Procedures: Your physician has requested that you have a carotid duplex. This test is an ultrasound of the carotid arteries in your neck. It looks at blood flow through these arteries that supply the brain with blood. Allow one hour for this exam. There are no restrictions or special instructions. This will take place at [address] 4th floor    Please note: We ask at that you not bring children with you during ultrasound (echo/ vascular) testing. Due to room size and safety concerns, children are not allowed in the ultrasound rooms during exams. Our front office staff cannot provide observation of children in our lobby area while testing is being conducted. An adult accompanying a patient to their appointment will only be allowed in the ultrasound room at the discretion of the ultrasound technician under special circumstances. We apologize for any inconvenience.   Your physician has requested that you have an Aorta/Iliac Duplex. This will take place at [address] [facility], 4th floor  No food after 11PM the night before.  Water  is OK. (Don't drink liquids if you have been instructed not to for ANOTHER test) Avoid foods that produce bowel gas, for 24 hours prior to exam (see below). No breakfast, no chewing gum, no smoking or carbonated beverages. Patient may take morning medications with water . Come in for test at least 15 minutes early to register.  Please note: We ask at that you not bring children with you during ultrasound (echo/ vascular) testing. Due to room size and safety concerns, children are not allowed in the ultrasound rooms during exams. Our front office staff cannot provide observation of  children in our lobby area while testing is being conducted. An adult accompanying a patient to their appointment will only be allowed in the ultrasound room at the discretion of the ultrasound technician under special circumstances. We apologize for any inconvenience.   Your physician has requested that you have an ankle brachial index (ABI). During this test an ultrasound and blood pressure cuff are used to evaluate the arteries that supply the arms and legs with blood. Allow thirty minutes for this exam. There are no restrictions or special instructions. This will take place at [address] 4th floor   Please note: We ask at that you not bring children with you during ultrasound (echo/ vascular) testing. Due to room size and safety concerns, children are not allowed in the ultrasound rooms during exams. Our front office staff cannot provide observation of children in our lobby area while testing is being conducted. An adult accompanying a patient to their appointment will only be allowed in the ultrasound room at the discretion of the ultrasound technician under special circumstances. We apologize for any inconvenience.   Follow-Up: At [facility], you and your health needs are our priority.  As part of our continuing mission to provide you with exceptional heart care, our providers are all part of one team.  This team includes your primary Cardiologist (physician) and Advanced Practice Providers or APPs (Physician Assistants and Nurse Practitioners) who all work together to provide you with the care you need, when you need it.  Your next appointment:   12 month(s)  Provider:   Lauro Portal, MD  We recommend signing up for the patient portal called MyChart.  Sign up information is provided on this After Visit Summary.  MyChart is used to connect with patients for Virtual Visits (Telemedicine).  Patients are able to view lab/test results, encounter notes, upcoming  appointments, etc.  Non-urgent messages can be sent to your provider as well.   To learn more about what you can do with MyChart, go to [email or web address].

## 2024-05-21 NOTE — Assessment & Plan Note (Signed)
 History of hyperlipidemia intolerant to statin therapy and Repatha  with lipid profile performed 11/07/2023 revealing a total cholesterol 219, LDL 128 and HDL 45.

## 2024-05-21 NOTE — Assessment & Plan Note (Signed)
 History of PAD status post left common iliac artery stenting by myself 08/22/2016 successfully.  I attempted to percutaneously recanalize his right common iliac CTO 10/13/2016 unsuccessfully.  I referred him to Dr. Charlotte Cookey who performed left to right common femoral crossover grafting 12/30/2016.  Currently denies claudication.  His Dopplers have remained stable although he does have moderate in-stent restenosis within the left common iliac artery stent.

## 2024-05-21 NOTE — Assessment & Plan Note (Signed)
 History of moderate right ICA stenosis by duplex ultrasound obtained 11/17/2023.  This will be repeated on an annual basis.

## 2024-05-21 NOTE — Progress Notes (Signed)
 05/21/2024 Derrick [name]   [birth year]  [id number]  Primary Physician Imelda Man, MD Primary Cardiologist: Avanell Leigh MD FACP, [place], [place], [place]  HPI:  Derrick [name] is a 78 y.o.  thin and fit-appearing married Caucasian male father of 3, grandfather to 3 grandchildren who I last saw in the office 02/16/2022.  He was referred by Dr. Schuyler Custard for cardiovascular evaluation because of a prior history of coronary stenting. Factors include treated diabetes and hyperlipidemia intolerant to statin therapy. He smoked 45 pack years and quit at the time of his myocardial infarction in 2010. He does have a family history of heart disease with a father and brother both of whom had coronary artery bypass grafting. He'll myocardial infarction back in 2010 and had stenting performed by Dr. Grandville Lax. He does get occasional atypical chest pain every other month. There also is a question of moderate mitral regurgitation. A 2-D echocardiogram showed normal LV function with mild MR. The Myoview stress test which was read as intermediate risk with inferior and lateral ischemia. Based on this, the patient was referred for cardiac catheterization to define his anatomy. It should also be noted the patient has been having right hip pain thought to be arthritic for many years.   I performed cardiac catheterization on him 03/28/16 revealing a patent proximal LAD stent, 95% ostial circumflex, 70% mid AV groove circumflex a long 70% proximal and mid dominant RCA stenosis. I thought his anatomy was most suited for coronary artery bypass grafting which was performed by Dr. Sherene Dilling on 04/22/16. The LIMA graft to his OM1, vein to OM 2 and a free RIMA to the RCA. His postoperative course was uncomplicated. He is back to work now denying chest pain or shortness of breath. His major issue now is bilateral hip and calf claudication which is lifestyle limiting. He presents today for angiography and potential right common iliac artery  PTA and stenting for chronic total occlusion. He had left common iliac artery stenting 08/22/16 successfully improving his left lower extremity . I attempted to percutaneously recanalized his right common iliac chronic total occlusion of 10/13/16 unsuccessfully. He continues to have lifestyle limiting right hip claudication. We talked about options and the patient desires to proceed with surgical revascularization. I'm referred him to Dr. Charlotte Cookey for consideration of left to right femorofemoral crossover grafting which was performed on 12/30/16 successfully. He had a postop visit with Dr. Charlotte Cookey one month later and was released back to my care after that. He says his claudication is somewhat improved. Recent Dopplers performed 03/09/2018 showed increased velocities within his left common iliac artery stent and of the proximal anastomosis of the graft although he continues to deny claudication.   Since I saw him in the office 2 years ago he remained stable.  He denies chest pain or shortness of breath.  Also denies claudication.  His lower extremity arterial Doppler studies have remained stable with moderate in-stent restenosis within the left common iliac artery stent.  He is on a statin intolerant and intolerant to Repatha  as well with an elevated lipid profile.     Current Meds  Medication Sig   aspirin  EC 81 MG tablet Take 1 tablet (81 mg total) by mouth daily.   Blood Glucose Monitoring Suppl (ONE TOUCH ULTRA SYSTEM KIT) W/DEVICE KIT Patient must check sugar TID   clopidogrel  (PLAVIX ) 75 MG tablet Take 1 tablet (75 mg total) by mouth daily.   docusate sodium  (COLACE) 100 MG capsule Take  1 capsule (100 mg total) by mouth 2 (two) times daily.   ezetimibe (ZETIA) 10 MG tablet Take 1 tablet by mouth daily.   glucose blood (ONE TOUCH ULTRA TEST) test strip USE ONE STRIP TO CHECK GLUCOSE 4 TIMES DAILY AS NEEDED   HYDROcodone -acetaminophen  (NORCO/VICODIN) 5-325 MG tablet Take 1 tablet by mouth every 6 (six)  hours as needed for moderate pain. (Patient taking differently: Take 1 tablet by mouth daily.)   insulin  lispro (HUMALOG ) 100 UNIT/ML injection up to 100 units via insulin  pump   insulin  lispro (HUMALOG ) 100 UNIT/ML injection SMARTSIG:0-100 Unit(s) SUB-Q As Directed   lisinopril  (PRINIVIL ,ZESTRIL ) 2.5 MG tablet Take 2.5 mg by mouth daily.   metoprolol  tartrate (LOPRESSOR ) 25 MG tablet Take 0.5 tablets (12.5 mg total) by mouth 2 (two) times daily.   ONETOUCH DELICA LANCETS 33G MISC USE ONE  4 TIMES DAILY AS NEEDED   pantoprazole  (PROTONIX ) 40 MG tablet Take 1 tablet by mouth once daily   REPATHA  SURECLICK 140 MG/ML SOAJ    research study medication Take 1 each by mouth daily. Study drug for cholesterol from Dr. Imelda Man     Allergies  Allergen Reactions   Atorvastatin  Other (See Comments)    MYALGIAS MUSCLE CRAMPS    Lovastatin  Other (See Comments)    MYALGIAS MUSCLE CRAMPS   Pravastatin  Other (See Comments)    MYALGIAS MUSCLE CRAMPS   Simvastatin Other (See Comments)    MYALGIAS MUSCLE CRAMPS    Social History   Socioeconomic History   Marital status: Married    Spouse name: Not on file   Number of children: Not on file   Years of education: Not on file   Highest education level: Not on file  Occupational History   Not on file  Tobacco Use   Smoking status: Former    Current packs/day: 0.00    Average packs/day: 1 pack/day for 50.0 years (50.0 ttl pk-yrs)    Types: Cigarettes    Start date: 01/06/1959    Quit date: 01/06/2009    Years since quitting: 15.3   Smokeless tobacco: Never  Vaping Use   Vaping status: Former  Substance and Sexual Activity   Alcohol use: No    Alcohol/week: 0.0 standard drinks of alcohol    Comment: drank some in high school   Drug use: No   Sexual activity: Never  Other Topics Concern   Not on file  Social History Narrative   Married, works part time at [employer] in [place], [place].   Former smoker: quit 01/2011.  No  alcohol or drugs.   No formal exercise.   Social Drivers of Corporate investment banker Strain: Not on file  Food Insecurity: Not on file  Transportation Needs: Not on file  Physical Activity: Not on file  Stress: Not on file  Social Connections: Not on file  Intimate Partner Violence: Not on file     Review of Systems: General: negative for chills, fever, night sweats or weight changes.  Cardiovascular: negative for chest pain, dyspnea on exertion, edema, orthopnea, palpitations, paroxysmal nocturnal dyspnea or shortness of breath Dermatological: negative for rash Respiratory: negative for cough or wheezing Urologic: negative for hematuria Abdominal: negative for nausea, vomiting, diarrhea, bright red blood per rectum, melena, or hematemesis Neurologic: negative for visual changes, syncope, or dizziness All other systems reviewed and are otherwise negative except as noted above.    Blood pressure 128/70, pulse 72, height 5' 6 (1.676 m), weight 129 lb (58.5  kg), SpO2 94%.  General appearance: alert and no distress Neck: no adenopathy, no carotid bruit, no JVD, supple, symmetrical, trachea midline, and thyroid  not enlarged, symmetric, no tenderness/mass/nodules Lungs: clear to auscultation bilaterally Heart: regular rate and rhythm, S1, S2 normal, no murmur, click, rub or gallop Extremities: extremities normal, atraumatic, no cyanosis or edema Pulses: 2+ and symmetric Skin: Skin color, texture, turgor normal. No rashes or lesions Neurologic: Grossly normal  EKG EKG Interpretation Date/Time:  Tuesday May 21 2024 13:53:08 EDT Ventricular Rate:  72 PR Interval:  152 QRS Duration:  92 QT Interval:  392 QTC Calculation: 429 R Axis:   82  Text Interpretation: Sinus rhythm with Premature atrial complexes Possible Left atrial enlargement Nonspecific T wave abnormality When compared with ECG of 16-Oct-2023 09:05, Premature atrial complexes are now Present Confirmed by Lauro Portal [phone]) on 05/21/2024 1:57:22 PM    ASSESSMENT AND PLAN:   Coronary atherosclerosis History of CAD status post myocardial infarction back in 2010 with stenting by Dr. Grandville Lax.  I cathed him 03/28/2016 revealing a patent proximal LAD stent, 95% ostial circumflex, 70% mid AV groove circumflex and a long 70% proximal mid RCA stenosis.  I thought his anatomy was stenosis before CABG.  Dr. Sherene Dilling performed this 04/22/2016 with a LIMA to OM1, vein to OM 2 and free RIMA to the RCA.  His postop course was unremarkable.  He is completely asymptomatic.  S/P arterial stent, 08/22/16 Lt [facility] PTA/Stent  History of PAD status post left common iliac artery stenting by myself 08/22/2016 successfully.  I attempted to percutaneously recanalize his right common iliac CTO 10/13/2016 unsuccessfully.  I referred him to Dr. Charlotte Cookey who performed left to right common femoral crossover grafting 12/30/2016.  Currently denies claudication.  His Dopplers have remained stable although he does have moderate in-stent restenosis within the left common iliac artery stent.  Right-sided carotid artery disease (HCC) History of moderate right ICA stenosis by duplex ultrasound obtained 11/17/2023.  This will be repeated on an annual basis.  Hyperlipidemia History of hyperlipidemia intolerant to statin therapy and Repatha  with lipid profile performed 11/07/2023 revealing a total cholesterol 219, LDL 128 and HDL 45.     Avanell Leigh MD FACP,FACC,FAHA, [facility] 05/21/2024 2:07 PM

## 2024-05-21 NOTE — Assessment & Plan Note (Signed)
 History of CAD status post myocardial infarction back in 2010 with stenting by Dr. Grandville Lax.  I cathed him 03/28/2016 revealing a patent proximal LAD stent, 95% ostial circumflex, 70% mid AV groove circumflex and a long 70% proximal mid RCA stenosis.  I thought his anatomy was stenosis before CABG.  Dr. Sherene Dilling performed this 04/22/2016 with a LIMA to OM1, vein to OM 2 and free RIMA to the RCA.  His postop course was unremarkable.  He is completely asymptomatic.

## 2024-06-13 DIAGNOSIS — H40033 Anatomical narrow angle, bilateral: Secondary | ICD-10-CM | POA: Diagnosis not present

## 2024-06-13 DIAGNOSIS — H2513 Age-related nuclear cataract, bilateral: Secondary | ICD-10-CM | POA: Diagnosis not present

## 2024-06-18 DIAGNOSIS — T466X5A Adverse effect of antihyperlipidemic and antiarteriosclerotic drugs, initial encounter: Secondary | ICD-10-CM | POA: Diagnosis not present

## 2024-06-18 DIAGNOSIS — I10 Essential (primary) hypertension: Secondary | ICD-10-CM | POA: Diagnosis not present

## 2024-06-18 DIAGNOSIS — I252 Old myocardial infarction: Secondary | ICD-10-CM | POA: Diagnosis not present

## 2024-06-18 DIAGNOSIS — E7800 Pure hypercholesterolemia, unspecified: Secondary | ICD-10-CM | POA: Diagnosis not present

## 2024-06-18 DIAGNOSIS — Z794 Long term (current) use of insulin: Secondary | ICD-10-CM | POA: Diagnosis not present

## 2024-06-18 DIAGNOSIS — M791 Myalgia, unspecified site: Secondary | ICD-10-CM | POA: Diagnosis not present

## 2024-06-18 DIAGNOSIS — E1140 Type 2 diabetes mellitus with diabetic neuropathy, unspecified: Secondary | ICD-10-CM | POA: Diagnosis not present

## 2024-06-26 DIAGNOSIS — E1140 Type 2 diabetes mellitus with diabetic neuropathy, unspecified: Secondary | ICD-10-CM | POA: Diagnosis not present

## 2024-07-04 DIAGNOSIS — E1140 Type 2 diabetes mellitus with diabetic neuropathy, unspecified: Secondary | ICD-10-CM | POA: Diagnosis not present

## 2024-07-18 DIAGNOSIS — E1140 Type 2 diabetes mellitus with diabetic neuropathy, unspecified: Secondary | ICD-10-CM | POA: Diagnosis not present

## 2024-07-18 DIAGNOSIS — M791 Myalgia, unspecified site: Secondary | ICD-10-CM | POA: Diagnosis not present

## 2024-07-18 DIAGNOSIS — T466X5A Adverse effect of antihyperlipidemic and antiarteriosclerotic drugs, initial encounter: Secondary | ICD-10-CM | POA: Diagnosis not present

## 2024-07-18 DIAGNOSIS — I10 Essential (primary) hypertension: Secondary | ICD-10-CM | POA: Diagnosis not present

## 2024-07-18 DIAGNOSIS — E7800 Pure hypercholesterolemia, unspecified: Secondary | ICD-10-CM | POA: Diagnosis not present

## 2024-07-18 DIAGNOSIS — Z794 Long term (current) use of insulin: Secondary | ICD-10-CM | POA: Diagnosis not present

## 2024-07-18 DIAGNOSIS — R52 Pain, unspecified: Secondary | ICD-10-CM | POA: Diagnosis not present

## 2024-07-18 DIAGNOSIS — I252 Old myocardial infarction: Secondary | ICD-10-CM | POA: Diagnosis not present

## 2024-07-22 DIAGNOSIS — H04123 Dry eye syndrome of bilateral lacrimal glands: Secondary | ICD-10-CM | POA: Diagnosis not present

## 2024-07-29 ENCOUNTER — Encounter: Payer: Self-pay | Admitting: Neurology

## 2024-07-29 ENCOUNTER — Ambulatory Visit: Admitting: Neurology

## 2024-07-29 VITALS — BP 134/66 | HR 58 | Ht 66.0 in | Wt 133.2 lb

## 2024-07-29 DIAGNOSIS — H532 Diplopia: Secondary | ICD-10-CM | POA: Diagnosis not present

## 2024-07-29 DIAGNOSIS — I214 Non-ST elevation (NSTEMI) myocardial infarction: Secondary | ICD-10-CM | POA: Diagnosis not present

## 2024-07-29 DIAGNOSIS — E118 Type 2 diabetes mellitus with unspecified complications: Secondary | ICD-10-CM

## 2024-07-29 DIAGNOSIS — I739 Peripheral vascular disease, unspecified: Secondary | ICD-10-CM | POA: Diagnosis not present

## 2024-07-29 DIAGNOSIS — Z951 Presence of aortocoronary bypass graft: Secondary | ICD-10-CM

## 2024-07-29 DIAGNOSIS — Z794 Long term (current) use of insulin: Secondary | ICD-10-CM

## 2024-07-29 DIAGNOSIS — R011 Cardiac murmur, unspecified: Secondary | ICD-10-CM | POA: Diagnosis not present

## 2024-07-29 NOTE — Patient Instructions (Addendum)
 We will do a brain scan, called MRI and also and MR angiogram of the head, and call you with the test results. We will have to schedule you for this on a separate date. This test requires authorization from your insurance, and we will take care of the insurance process. We will check blood work today and call you with the test results. I recommend you establish care with an ophthalmologist. Talk to your PCP about a referral.  Please increase your water  intake to about 6 to 8 cups of water  per day as better hydration may help your circulation. Your double vision may be a complication of diabetes or from vascular disease, or both.  Should you have any sudden onset of one-sided weakness, numbness, tingling, shortness of breath, chest pain, one-sided headache or severe headache, changes in mental state, please proceed to the ER or call 911 immediately. So long as your test results are within acceptable parameters, we can see you back as needed.

## 2024-07-29 NOTE — Progress Notes (Signed)
 Subjective:    Patient ID: Derrick [name] is a 78 y.o. male.  HPI    True Mar, MD, PhD [facility] [address] [address] [place], [place] [address]  Dear Dr. Ladora,  I saw your patient, Derrick [name], upon your kind request in my neurologic clinic today for evaluation of his double vision.  The patient is unaccompanied today.  As you know, Derrick [name] is a 78 year old male with an underlying complex medical history of COPD, coronary artery disease with history of non-STEMI, status post stent placement and three-vessel CABG, mitral regurgitation, peripheral artery disease, diabetes with peripheral neuropathy, cataracts, chronic renal insufficiency, reflux disease, hypertension, hyperlipidemia, and tremor, status post multiple surgeries, including femoropopliteal bypass, CABG, peripheral artery stent placement, neck surgeries, back surgery, carpal tunnel release, history of chronic pain with chronic narcotic pain medication treatment, who reports new onset double vision about a week ago.  Symptoms are about stable and diplopia is binocular, especially when he looks straight, no eye pain, no loss of vision, no one-sided weakness or numbness or tingling or droopy face or slurring of speech, no falls, no headache.  I reviewed your office note from 07/22/2024.  He reported a recent onset of double vision a day prior.  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  Dilated fundus examination was noted to be unremarkable bilaterally.  He did not go to the emergency room. Diplopia was improved with prism  eyeglasses. I had evaluated him for tremor in the past.   He does not drink a lot of water , maybe 2 glasses/day, occasional soda, 2 cups of coffee in the morning, no alcohol, quit smoking in 2010.  Previously:  12/31/2020: 78 year old left-handed gentleman with an underlying complex medical history of hypertension, hyperlipidemia, coronary artery disease  with history of MI, status post stent placement, diabetes, neuropathy, cataracts, reflux disease, peripheral artery disease, chronic renal insufficiency, COPD, and mitral regurgitation, who reports a several month history of bilateral hand tremors.  Tremor is bothersome when he tries to eat or hold something.  It started in both hands and gradually became worse.  Per wife, he has probably had a hand tremor close to a year, it is somewhat progressive.  She also reports that for as long as she has known him he has always had a subtle hand tremor but she has noticed it more in the past few months.  He is not sure if anybody in his family had a diagnosis of Parkinson's disease but his dad had a mouth and bilateral hand tremor.  He was not actually diagnosed with Parkinson's disease or on treatment for it as he recalls.  He has 2 brothers and 1 sister, neither 1 with tremors.  He has 3 children, oldest daughter is from his first marriage, she is 79 years old, he has 2 children from his current wife, son is 65 and daughter is 38, neither of his children have any tremor issues.  He is retired from [employer] work for nearly 30 years. I reviewed your office note from 10/15/2020.  He lives with his wife.  He denies any major issues with anxiety but his wife is worried that he has mood irritability and anxiety issues.  He denies any major problems with mood irritability.  He had blood work through your office in November 2021 and I was able to review the results.  Latest A1c in November 2021 was 7.2.  He does not drink any  alcohol, he drinks caffeine in the form of coffee, 2 cups/day and at least 2 glasses of soda per day, diet.  Of note, he is on hydrocodone  1 pill at night for his neuropathy, he goes to [facility] for pain management.  He denies any one-sided weakness or numbness or tingling or droopy face or slurring of speech.  His wife feels that his voice is sometimes hoarse and mouth is dry.  He admits that he  does not drink a whole lot of water .  He estimates that he drinks maybe a glass of water  per day on average.  His Past Medical History Is Significant For: Past Medical History:  Diagnosis Date   Anginal pain (HCC)    occ    Chronic renal insufficiency, stage III (moderate) (HCC) 2013   CrCl est high 50s (borderline stage II/III pt denies 12/27/16   COPD (chronic obstructive pulmonary disease) (HCC)    Coronary atherosclerosis of native coronary vessel    S/P NSTEMI 04/11/2009--DEL stent to LAD.  Nuclear stress test 11/2010 NEG, EF normal   Diabetic peripheral neuropathy (HCC)    Painful; 1 vicodin bid very helpful (pt resistant to alternative tx's)   Early cataracts, bilateral 04/24/2012   [name] in [place], [place]   GERD (gastroesophageal reflux disease)    Heart murmur    patient denies   Herpes zoster    Two separate outbreaks--one on left axillary/back region, one on right axillary/back region   Hyperlipidemia    Hypertension    Mitral regurgitation 08/2013   Moderate (echo)   Myocardial infarction (HCC) 2010   Peripheral arterial disease (HCC)    S/P arterial stent, 08/22/16 Lt [facility] PTA/Stent  08/23/2016   Type II diabetes mellitus (HCC)     His Past Surgical History Is Significant For: Past Surgical History:  Procedure Laterality Date   ANTERIOR CERVICAL DECOMP/DISCECTOMY FUSION  2004 X 2   1st one collapsed; had to go in 1 month later & do the front and back   BACK SURGERY     CARDIAC CATHETERIZATION N/A 03/28/2016   Procedure: Left Heart Cath and Coronary Angiography;  Surgeon: Dorn JINNY Lesches, MD;  Location: [facility] INVASIVE CV LAB;  Service: Cardiovascular;  Laterality: N/A;   CARDIOVASCULAR STRESS TEST  11/2010   No ischemia/normal EF   CARPAL TUNNEL RELEASE Left 05/28/2015   Procedure: LEFT HAND CARPAL TUNNEL RELEASE;  Surgeon: Prentice Pagan, MD;  Location: [facility];  Service: Orthopedics;  Laterality: Left;   CHOLECYSTECTOMY     2024   COLONOSCOPY   12/2010   Normal screening colonoscopy; rpt 10 yrs   CORONARY ANGIOPLASTY WITH STENT PLACEMENT  04/2009   DES to LAD   CORONARY ARTERY BYPASS GRAFT N/A 04/22/2016   Procedure: CORONARY ARTERY BYPASS GRAFTING (CABG) LIMA to OM1 SVG to OM2 FREE RIMA to RCA  ENDOSCOPIC HARVEST GREATER SAPHENOUS VEIN -Right Thigh;  Surgeon: Dorise MARLA Fellers, MD;  Location: [facility] OR;  Service: Open Heart Surgery;  Laterality: N/A;   cryotherapy to AK lesion on nose  01/2010   eye exam,diabetic  02/2011   Normal diabetic retinopathy screening exam at [facility] in [place], [place].   FEMORAL-FEMORAL BYPASS GRAFT Bilateral 12/30/2016   Procedure: BYPASS GRAFT LEFT FEMORAL-RIGHT FEMORAL ARTERY;  Surgeon: Gaile LELON New, MD;  Location: [facility] OR;  Service: Vascular;  Laterality: Bilateral;   ILIAC VEIN ANGIOPLASTY / STENTING  08/22/2016   Abdominal aortogram, bilateral iliac angiogram, bifemoral runoff   LOWER  EXTREMITY ANGIOGRAM  10/13/2016   LUMBAR FUSION  2002   put screws in   PERIPHERAL VASCULAR CATHETERIZATION N/A 03/28/2016   Procedure: Abdominal Aortogram;  Surgeon: Dorn JINNY Lesches, MD;  Location: [facility] INVASIVE CV LAB;  Service: Cardiovascular;  Laterality: N/A;   PERIPHERAL VASCULAR CATHETERIZATION Bilateral 08/22/2016   Procedure: Lower Extremity Angiography;  Surgeon: Dorn JINNY Lesches, MD;  Location: [facility] INVASIVE CV LAB;  Service: Cardiovascular;  Laterality: Bilateral;   PERIPHERAL VASCULAR CATHETERIZATION Left 08/22/2016   Procedure: Peripheral Vascular Intervention;  Surgeon: Dorn JINNY Lesches, MD;  Location: [facility] INVASIVE CV LAB;  Service: Cardiovascular;  Laterality: Left;  COMMON ILIAC   PERIPHERAL VASCULAR CATHETERIZATION Bilateral 10/13/2016   Procedure: Lower Extremity Angiography;  Surgeon: Dorn JINNY Lesches, MD;  Location: [facility] INVASIVE CV LAB;  Service: Cardiovascular;  Laterality: Bilateral;   PERIPHERAL VASCULAR CATHETERIZATION Right 10/13/2016   Procedure: Peripheral Vascular Balloon Angioplasty;  Surgeon:  Dorn JINNY Lesches, MD;  Location: [facility] INVASIVE CV LAB;  Service: Cardiovascular;  Laterality: Right;  Right Common ILIac   POSTERIOR FUSION CERVICAL SPINE  2004   TEE WITHOUT CARDIOVERSION N/A 04/22/2016   Procedure: TRANSESOPHAGEAL ECHOCARDIOGRAM (TEE);  Surgeon: Dorise MARLA Fellers, MD;  Location: [facility] OR;  Service: Open Heart Surgery;  Laterality: N/A;   TRANSTHORACIC ECHOCARDIOGRAM  08/2013   LVH, EF 55-65%, no WMA.  Moderate mitral regurg    His Family History Is Significant For: Family History  Problem Relation Age of Onset   Brain cancer Mother    Diabetes Father    Heart disease Father    Diabetes Brother    Heart disease Brother    Diabetes Son    Hyperlipidemia Son    Coronary artery disease Other        family hx of    His Social History Is Significant For: Social History   Socioeconomic History   Marital status: Married    Spouse name: Not on file   Number of children: Not on file   Years of education: Not on file   Highest education level: Not on file  Occupational History   Not on file  Tobacco Use   Smoking status: Former    Current packs/day: 0.00    Average packs/day: 1 pack/day for 50.0 years (50.0 ttl pk-yrs)    Types: Cigarettes    Start date: 01/06/1959    Quit date: 01/06/2009    Years since quitting: 15.5   Smokeless tobacco: Never  Vaping Use   Vaping status: Never Used  Substance and Sexual Activity   Alcohol use: No    Alcohol/week: 0.0 standard drinks of alcohol    Comment: drank some in high school   Drug use: No   Sexual activity: Never  Other Topics Concern   Not on file  Social History Narrative   Married, retired from [employer] improvement in [place], [place].Former smoker: quit 01/2011.  No alcohol or drugs.No formal exercise.   Caffiene 2 cups daily      Social Drivers of Corporate investment banker Strain: Not on file  Food Insecurity: Not on file  Transportation Needs: Not on file  Physical Activity: Not on file  Stress: Not on file   Social Connections: Not on file    His Allergies Are:  Allergies  Allergen Reactions   Atorvastatin  Other (See Comments)    MYALGIAS MUSCLE CRAMPS    Lovastatin  Other (See Comments)    MYALGIAS MUSCLE CRAMPS   Pravastatin  Other (See Comments)  MYALGIAS MUSCLE CRAMPS   Simvastatin Other (See Comments)    MYALGIAS MUSCLE CRAMPS  :   His Current Medications Are:  Outpatient Encounter Medications as of 07/29/2024  Medication Sig   aspirin  EC 81 MG tablet Take 1 tablet (81 mg total) by mouth daily.   Blood Glucose Monitoring Suppl (ONE TOUCH ULTRA SYSTEM KIT) W/DEVICE KIT Patient must check sugar TID   clopidogrel  (PLAVIX ) 75 MG tablet Take 1 tablet (75 mg total) by mouth daily.   docusate sodium  (COLACE) 100 MG capsule Take 1 capsule (100 mg total) by mouth 2 (two) times daily.   ezetimibe (ZETIA) 10 MG tablet Take 1 tablet by mouth daily.   glucose blood (ONE TOUCH ULTRA TEST) test strip USE ONE STRIP TO CHECK GLUCOSE 4 TIMES DAILY AS NEEDED   HYDROcodone -acetaminophen  (NORCO/VICODIN) 5-325 MG tablet Take 1 tablet by mouth every 6 (six) hours as needed for moderate pain. (Patient taking differently: Take 1 tablet by mouth daily.)   insulin  lispro (HUMALOG ) 100 UNIT/ML injection up to 100 units via insulin  pump   insulin  lispro (HUMALOG ) 100 UNIT/ML injection SMARTSIG:0-100 Unit(s) SUB-Q As Directed   lisinopril  (PRINIVIL ,ZESTRIL ) 2.5 MG tablet Take 2.5 mg by mouth daily.   metoprolol  tartrate (LOPRESSOR ) 25 MG tablet Take 0.5 tablets (12.5 mg total) by mouth 2 (two) times daily. (Patient taking differently: Take 25 mg by mouth daily.)   ONETOUCH DELICA LANCETS 33G MISC USE ONE  4 TIMES DAILY AS NEEDED   pantoprazole  (PROTONIX ) 40 MG tablet Take 1 tablet by mouth once daily   research study medication Take 1 each by mouth daily. Study drug for cholesterol from Dr. Ryan Hives   REPATHA  SURECLICK 140 MG/ML SOAJ  (Patient not taking: Reported on 07/29/2024)   No  facility-administered encounter medications on file as of 07/29/2024.  :   Review of Systems:  Out of a complete 14 point review of systems, all are reviewed and negative with the exception of these symptoms as listed below:  Review of Systems  Neurological:        Referred for diploplia started about 1 wk ago.  No other symptoms.  (From last seen 2022 for bil essential tremor.     Objective:  Neurological Exam  Physical Exam Physical Examination:   Vitals:   07/29/24 0737  BP: (!) 152/68  Pulse: (!) 58    General Examination: The patient is a very pleasant 78 y.o. male in no acute distress. He appears frail.    HEENT: Normocephalic, atraumatic, pupils are equal, round and reactive to light, extraocular tracking is well preserved.  No evidence of abducens palsy, no obvious disconjugate gaze, bilateral cataracts noted, right more than left.  He has corrective eyeglasses in place, hearing is grossly intact.  Voice is slightly hoarse at times, not particularly hypophonic, no significant dysarthria noted.  He has an intermittent lower lip and jaw tremor.  Airway examination reveals severe mouth dryness, dentures, tongue protrudes centrally and palate elevates symmetrically.    Chest: Clear to auscultation without wheezing, rhonchi or crackles noted.   Heart: S1+S2+0, regular with prominent systolic heart murmur noted.     Abdomen: Soft, non-tender and non-distended with normal bowel sounds appreciated on auscultation.   Extremities: There is no pitting edema in the distal lower extremities bilaterally.    Skin: Warm and dry without trophic changes noted.   Musculoskeletal: exam reveals arthritic changes in both hands.     Neurologically:  Mental status: The patient is awake, alert and  oriented in all 4 spheres. His immediate and remote memory, attention, language skills and fund of knowledge are appropriate. There is no evidence of aphasia, agnosia, apraxia or anomia. Speech is  clear with normal prosody and enunciation. Thought process is linear. Mood is normal and affect is normal.  Cranial nerves II - XII are as described above under HEENT exam. In addition: shoulder shrug is normal with equal shoulder height noted. Motor exam: Normal bulk, strength and tone is noted. There is no drift, resting tremor or rebound. Romberg is negative.    Reflexes are trace in the upper extremities and absent in the lower extremities.  Fine motor skills and coordination: intact with normal finger taps, normal hand movements, normal rapid alternating patting, normal foot taps and normal foot agility.  Cerebellar testing: No dysmetria or intention tremor on finger to nose testing. Heel to shin is unremarkable bilaterally. There is no truncal or gait ataxia.  Sensory exam: intact to light touch, temperature and vibration sense in the upper and lower extremities.  Gait, station and balance: He stands without difficulty, posture is age-appropriate.  He walks slightly slowly, no shuffling noted, he has preserved arm swing.  He turns slowly.                Assessment and Plan:    In summary, Derrick [name] is a 78 year old male with an underlying complex medical history of COPD, coronary artery disease with history of non-STEMI, status post stent placement and three-vessel CABG, mitral regurgitation, peripheral artery disease, diabetes with peripheral neuropathy, cataracts, chronic renal insufficiency, reflux disease, hypertension, hyperlipidemia, and tremor, status post multiple surgeries, including femoropopliteal bypass, CABG, peripheral artery stent placement, neck surgeries, back surgery, carpal tunnel release, history of chronic pain with chronic narcotic pain medication treatment, who presents for evaluation of his new onset diplopia of approximately 1 weeks duration.  He is advised that his double vision may be from vascular disease or diabetes or combination of factors.  For any sudden onset  of neurological symptoms he is advised to call 911 or go to the emergency room with someone.  We talked about the importance of maintaining a healthy lifestyle, particularly better hydration with water .   Below is a summary of my recommendations and our discussion points from today's visit, based on chart review, history and examination. They were given these instructions verbally during the visit in detail and also in writing in the MyChart after visit summary (AVS), which they can access electronically. <<>>    <<We will do a brain scan, called MRI and also and MR angiogram of the head, and call you with the test results. We will have to schedule you for this on a separate date. This test requires authorization from your insurance, and we will take care of the insurance process. We will check blood work today and call you with the test results. I recommend you establish care with an ophthalmologist. Talk to your PCP about a referral.  Please increase your water  intake to about 6 to 8 cups of water  per day as better hydration may help your circulation. Your double vision may be a complication of diabetes or from vascular disease, or both.  Should you have any sudden onset of one-sided weakness, numbness, tingling, shortness of breath, chest pain, one-sided headache or severe headache, changes in mental state, please proceed to the ER or call 911 immediately. So long as your test results are within acceptable parameters, we can see you back as  needed.  >>   This was an extended visit of over 60 minutes with copious record review involved, and considerable counseling and coordination of care.  Thank you very much for allowing me to participate in the care of this nice patient. If I can be of any further assistance to you please do not hesitate to call me at [phone].   Sincerely,     True Mar, MD, PhD

## 2024-07-30 ENCOUNTER — Telehealth: Payer: Self-pay | Admitting: Neurology

## 2024-07-30 ENCOUNTER — Ambulatory Visit: Payer: Self-pay | Admitting: Neurology

## 2024-07-30 DIAGNOSIS — M79674 Pain in right toe(s): Secondary | ICD-10-CM | POA: Diagnosis not present

## 2024-07-30 DIAGNOSIS — M79675 Pain in left toe(s): Secondary | ICD-10-CM | POA: Diagnosis not present

## 2024-07-30 DIAGNOSIS — B351 Tinea unguium: Secondary | ICD-10-CM | POA: Diagnosis not present

## 2024-07-30 DIAGNOSIS — L84 Corns and callosities: Secondary | ICD-10-CM | POA: Diagnosis not present

## 2024-07-30 DIAGNOSIS — E1142 Type 2 diabetes mellitus with diabetic polyneuropathy: Secondary | ICD-10-CM | POA: Diagnosis not present

## 2024-07-30 LAB — ANA W/REFLEX: Anti Nuclear Antibody (ANA): NEGATIVE

## 2024-07-30 LAB — HGB A1C W/O EAG: Hgb A1c MFr Bld: 8.7 % — ABNORMAL HIGH (ref 4.8–5.6)

## 2024-07-30 LAB — B12 AND FOLATE PANEL
Folate: 8.9 ng/mL (ref >3.0)
Vitamin B-12: 207 pg/mL — ABNORMAL LOW (ref 232–1245)

## 2024-07-30 LAB — TSH: TSH: 4.3 u[IU]/mL (ref 0.450–4.500)

## 2024-07-30 NOTE — Telephone Encounter (Signed)
 I called pt and relayed the results of the labs elevated HgbA1c above 8, suboptimal treatment of diabetes.  He said he saw pcp last week.  I told him will forward to Dr. Clarice.  Also his b12 was low needs b12 injections.  Also see pcp for this.  Will route to pcp, but wanted him to follow up on this as well.  He said he would.  He verbalized understanding.

## 2024-07-30 NOTE — Telephone Encounter (Signed)
-----   Message from True Mar sent at 07/30/2024 10:01 AM EDT ----- Please call patient and advise them that his hemoglobin A1c which is a marker for diabetes is elevated above 8, this indicates suboptimal diabetes control and I recommend follow-up with PCP or his  diabetes specialist if he has an endocrinologist.  Furthermore, his vitamin B12 level is low and he will likely need B12 injections, I recommend follow-up with PCP as soon as possible to discuss  vitamin B12 supplementation, including the possibility of starting injections for this. ----- Message ----- From: Interface, [facility] Lab Results In Sent: 07/30/2024   7:37 AM EDT To: True Mar, MD

## 2024-07-30 NOTE — Telephone Encounter (Signed)
 no auth required sent to GI [phone]

## 2024-08-09 ENCOUNTER — Ambulatory Visit
Admission: RE | Admit: 2024-08-09 | Discharge: 2024-08-09 | Disposition: A | Discharge status: Home / Self Care | Source: Ambulatory Visit | Attending: Neurology | Admitting: Neurology

## 2024-08-09 DIAGNOSIS — Z794 Long term (current) use of insulin: Secondary | ICD-10-CM | POA: Diagnosis not present

## 2024-08-09 DIAGNOSIS — H532 Diplopia: Secondary | ICD-10-CM

## 2024-08-09 DIAGNOSIS — R011 Cardiac murmur, unspecified: Secondary | ICD-10-CM | POA: Diagnosis not present

## 2024-08-09 DIAGNOSIS — I214 Non-ST elevation (NSTEMI) myocardial infarction: Secondary | ICD-10-CM | POA: Diagnosis not present

## 2024-08-09 DIAGNOSIS — Z951 Presence of aortocoronary bypass graft: Secondary | ICD-10-CM

## 2024-08-09 DIAGNOSIS — I739 Peripheral vascular disease, unspecified: Secondary | ICD-10-CM

## 2024-08-09 DIAGNOSIS — E118 Type 2 diabetes mellitus with unspecified complications: Secondary | ICD-10-CM | POA: Diagnosis not present

## 2024-08-09 MED ORDER — GADOPICLENOL 0.5 MMOL/ML IV SOLN
6.0000 mL | Freq: Once | INTRAVENOUS | Status: AC | PRN
  Administered 2024-08-09: 6 mL via INTRAVENOUS

## 2024-08-23 DIAGNOSIS — M7541 Impingement syndrome of right shoulder: Secondary | ICD-10-CM | POA: Diagnosis not present

## 2024-09-17 DIAGNOSIS — I252 Old myocardial infarction: Secondary | ICD-10-CM | POA: Diagnosis not present

## 2024-09-17 DIAGNOSIS — Z794 Long term (current) use of insulin: Secondary | ICD-10-CM | POA: Diagnosis not present

## 2024-09-17 DIAGNOSIS — I10 Essential (primary) hypertension: Secondary | ICD-10-CM | POA: Diagnosis not present

## 2024-09-17 DIAGNOSIS — E7800 Pure hypercholesterolemia, unspecified: Secondary | ICD-10-CM | POA: Diagnosis not present

## 2024-09-17 DIAGNOSIS — T466X5A Adverse effect of antihyperlipidemic and antiarteriosclerotic drugs, initial encounter: Secondary | ICD-10-CM | POA: Diagnosis not present

## 2024-09-17 DIAGNOSIS — M791 Myalgia, unspecified site: Secondary | ICD-10-CM | POA: Diagnosis not present

## 2024-09-17 DIAGNOSIS — E1140 Type 2 diabetes mellitus with diabetic neuropathy, unspecified: Secondary | ICD-10-CM | POA: Diagnosis not present

## 2024-11-07 LAB — LAB REPORT - SCANNED: EGFR: 42

## 2024-11-14 ENCOUNTER — Ambulatory Visit: Payer: Self-pay | Admitting: Cardiovascular Disease

## 2024-11-14 DIAGNOSIS — E782 Mixed hyperlipidemia: Secondary | ICD-10-CM

## 2024-11-14 DIAGNOSIS — Z789 Other specified health status: Secondary | ICD-10-CM

## 2024-11-18 ENCOUNTER — Ambulatory Visit (HOSPITAL_COMMUNITY): Admission: RE | Admit: 2024-11-18 | Discharge: 2024-11-18 | Payer: Medicare Other | Attending: Cardiology

## 2024-11-18 ENCOUNTER — Ambulatory Visit (HOSPITAL_COMMUNITY)
Admission: RE | Admit: 2024-11-18 | Discharge: 2024-11-18 | Disposition: A | Discharge status: Home / Self Care | Payer: Medicare Other | Source: Ambulatory Visit | Attending: Cardiology | Admitting: Cardiology

## 2024-11-18 ENCOUNTER — Ambulatory Visit (HOSPITAL_COMMUNITY)
Admission: RE | Admit: 2024-11-18 | Discharge: 2024-11-18 | Disposition: A | Payer: Medicare Other | Source: Ambulatory Visit | Attending: Cardiology

## 2024-11-18 DIAGNOSIS — I739 Peripheral vascular disease, unspecified: Secondary | ICD-10-CM | POA: Insufficient documentation

## 2024-11-18 DIAGNOSIS — I6521 Occlusion and stenosis of right carotid artery: Secondary | ICD-10-CM | POA: Insufficient documentation

## 2024-11-18 DIAGNOSIS — Z95828 Presence of other vascular implants and grafts: Secondary | ICD-10-CM | POA: Diagnosis present

## 2024-11-18 DIAGNOSIS — I2510 Atherosclerotic heart disease of native coronary artery without angina pectoris: Secondary | ICD-10-CM | POA: Diagnosis present

## 2024-11-18 LAB — VAS US ABI WITH/WO TBI
Left ABI: 0.83
Right ABI: 0.67

## 2024-11-19 ENCOUNTER — Other Ambulatory Visit: Payer: Self-pay | Admitting: Registered Nurse

## 2024-11-19 ENCOUNTER — Ambulatory Visit: Payer: Self-pay | Admitting: Cardiology

## 2024-11-19 DIAGNOSIS — Z0000 Encounter for general adult medical examination without abnormal findings: Secondary | ICD-10-CM

## 2024-12-06 ENCOUNTER — Other Ambulatory Visit: Payer: Self-pay | Admitting: Cardiology

## 2024-12-18 ENCOUNTER — Encounter (HOSPITAL_BASED_OUTPATIENT_CLINIC_OR_DEPARTMENT_OTHER): Payer: Self-pay | Admitting: Internal Medicine

## 2024-12-18 ENCOUNTER — Ambulatory Visit (HOSPITAL_BASED_OUTPATIENT_CLINIC_OR_DEPARTMENT_OTHER): Admitting: Internal Medicine

## 2024-12-18 VITALS — BP 120/66 | HR 67 | Ht 66.0 in | Wt 135.9 lb

## 2024-12-18 DIAGNOSIS — E785 Hyperlipidemia, unspecified: Secondary | ICD-10-CM

## 2024-12-18 DIAGNOSIS — Z789 Other specified health status: Secondary | ICD-10-CM

## 2024-12-18 DIAGNOSIS — I779 Disorder of arteries and arterioles, unspecified: Secondary | ICD-10-CM

## 2024-12-18 DIAGNOSIS — I2510 Atherosclerotic heart disease of native coronary artery without angina pectoris: Secondary | ICD-10-CM | POA: Diagnosis not present

## 2024-12-18 DIAGNOSIS — E782 Mixed hyperlipidemia: Secondary | ICD-10-CM

## 2024-12-18 DIAGNOSIS — Z951 Presence of aortocoronary bypass graft: Secondary | ICD-10-CM

## 2024-12-18 MED ORDER — NEXLETOL 180 MG PO TABS: 1.0000 | ORAL_TABLET | Freq: Every day | ORAL | 3 refills | Status: AC

## 2024-12-18 NOTE — Patient Instructions (Signed)
 Medication Instructions:   START Nexletol  Take 1 tablet (180 mg total) by mouth daily. - Oral   *If you need a refill on your cardiac medications before your next appointment, please call your pharmacy*  Lab Work:  Your physician recommends that you return for a FASTING lipid profile in 3 months after starting Nexletol , fasting after midnight. Patient given paperwork today.    If you have labs (blood work) drawn today and your tests are completely normal, you will receive your results only by: MyChart Message (if you have MyChart) OR A paper copy in the mail If you have any lab test that is abnormal or we need to change your treatment, we will call you to review the results.  Testing/Procedures:  None ordered.   Follow-Up: At [facility], you and your health needs are our priority.  As part of our continuing mission to provide you with exceptional heart care, our providers are all part of one team.  This team includes your primary Cardiologist (physician) and Advanced Practice Providers or APPs (Physician Assistants and Nurse Practitioners) who all work together to provide you with the care you need, when you need it.  Your next appointment:   3 month(s)  Provider:   Dorn Lesches, MD    We recommend signing up for the patient portal called MyChart.  Sign up information is provided on this After Visit Summary.  MyChart is used to connect with patients for Virtual Visits (Telemedicine).  Patients are able to view lab/test results, encounter notes, upcoming appointments, etc.  Non-urgent messages can be sent to your provider as well.   To learn more about what you can do with MyChart, go to [email or web address].   Other Instructions

## 2024-12-18 NOTE — Progress Notes (Signed)
 "   LIPID CLINIC CONSULT NOTE  Chief Complaint:  Manage dyslipidemia  Primary Care Physician: Clarice Nottingham, MD  Primary Cardiologist:  Dorn Lesches, MD  HPI:  Derrick [name] is a 79 y.o. male who is being seen today for the evaluation of dyslipidemia at the request of Lesches Dorn PARAS, MD. This is a pleasant 79 year old male with a complex history of multiple medical problems including coronary artery disease with prior MI, peripheral arterial disease with stenting, type 2 diabetes, diabetic peripheral neuropathy, COPD, chronic kidney disease and other multiple medical problems.  He has a history of dyslipidemia at very high risk but the goal LDL less than 55.  Unfortunately he has not been able to reach his target due to intolerance of statins which caused severe myalgias and cramps, including atorvastatin , lovastatin , pravastatin  and simvastatin and he has also previously tried both Repatha  and Praluent which caused muscle cramps.  Currently he is on ezetimibe.  Recent lipid showed total cholesterol 200, triglycerides 141, HDL 53 and LDL 122.  PMHx:  Past Medical History:  Diagnosis Date   Anginal pain    occ    Chronic renal insufficiency, stage III (moderate) 2013   CrCl est high 50s (borderline stage II/III pt denies 12/27/16   COPD (chronic obstructive pulmonary disease) (HCC)    Coronary atherosclerosis of native coronary vessel    S/P NSTEMI 04/11/2009--DEL stent to LAD.  Nuclear stress test 11/2010 NEG, EF normal   Diabetic peripheral neuropathy (HCC)    Painful; 1 vicodin bid very helpful (pt resistant to alternative tx's)   Early cataracts, bilateral 04/24/2012   [name] in [place], [place]   GERD (gastroesophageal reflux disease)    Heart murmur    patient denies   Herpes zoster    Two separate outbreaks--one on left axillary/back region, one on right axillary/back region   Hyperlipidemia    Hypertension    Mitral regurgitation 08/2013   Moderate (echo)    Myocardial infarction (HCC) 2010   Peripheral arterial disease    S/P arterial stent, 08/22/16 Lt CIA PTA/Stent  08/23/2016   Type II diabetes mellitus (HCC)     Past Surgical History:  Procedure Laterality Date   ANTERIOR CERVICAL DECOMP/DISCECTOMY FUSION  2004 X 2   1st one collapsed; had to go in 1 month later & do the front and back   BACK SURGERY     CARDIAC CATHETERIZATION N/A 03/28/2016   Procedure: Left Heart Cath and Coronary Angiography;  Surgeon: Dorn PARAS Lesches, MD;  Location: [facility] INVASIVE CV LAB;  Service: Cardiovascular;  Laterality: N/A;   CARDIOVASCULAR STRESS TEST  11/2010   No ischemia/normal EF   CARPAL TUNNEL RELEASE Left 05/28/2015   Procedure: LEFT HAND CARPAL TUNNEL RELEASE;  Surgeon: Prentice Pagan, MD;  Location: [facility];  Service: Orthopedics;  Laterality: Left;   CHOLECYSTECTOMY     2024   COLONOSCOPY  12/2010   Normal screening colonoscopy; rpt 10 yrs   CORONARY ANGIOPLASTY WITH STENT PLACEMENT  04/2009   DES to LAD   CORONARY ARTERY BYPASS GRAFT N/A 04/22/2016   Procedure: CORONARY ARTERY BYPASS GRAFTING (CABG) LIMA to OM1 SVG to OM2 FREE RIMA to RCA  ENDOSCOPIC HARVEST GREATER SAPHENOUS VEIN -Right Thigh;  Surgeon: Dorise MARLA Fellers, MD;  Location: [facility] OR;  Service: Open Heart Surgery;  Laterality: N/A;   cryotherapy to AK lesion on nose  01/2010   eye exam,diabetic  02/2011   Normal diabetic retinopathy screening exam at  [name] in [place], [place].   FEMORAL-FEMORAL BYPASS GRAFT Bilateral 12/30/2016   Procedure: BYPASS GRAFT LEFT FEMORAL-RIGHT FEMORAL ARTERY;  Surgeon: Gaile LELON New, MD;  Location: [facility] OR;  Service: Vascular;  Laterality: Bilateral;   ILIAC VEIN ANGIOPLASTY / STENTING  08/22/2016   Abdominal aortogram, bilateral iliac angiogram, bifemoral runoff   LOWER EXTREMITY ANGIOGRAM  10/13/2016   LUMBAR FUSION  2002   put screws in   PERIPHERAL VASCULAR CATHETERIZATION N/A 03/28/2016   Procedure: Abdominal Aortogram;  Surgeon:  Dorn JINNY Lesches, MD;  Location: [facility] INVASIVE CV LAB;  Service: Cardiovascular;  Laterality: N/A;   PERIPHERAL VASCULAR CATHETERIZATION Bilateral 08/22/2016   Procedure: Lower Extremity Angiography;  Surgeon: Dorn JINNY Lesches, MD;  Location: [facility] INVASIVE CV LAB;  Service: Cardiovascular;  Laterality: Bilateral;   PERIPHERAL VASCULAR CATHETERIZATION Left 08/22/2016   Procedure: Peripheral Vascular Intervention;  Surgeon: Dorn JINNY Lesches, MD;  Location: [facility] INVASIVE CV LAB;  Service: Cardiovascular;  Laterality: Left;  COMMON ILIAC   PERIPHERAL VASCULAR CATHETERIZATION Bilateral 10/13/2016   Procedure: Lower Extremity Angiography;  Surgeon: Dorn JINNY Lesches, MD;  Location: [facility] INVASIVE CV LAB;  Service: Cardiovascular;  Laterality: Bilateral;   PERIPHERAL VASCULAR CATHETERIZATION Right 10/13/2016   Procedure: Peripheral Vascular Balloon Angioplasty;  Surgeon: Dorn JINNY Lesches, MD;  Location: [facility] INVASIVE CV LAB;  Service: Cardiovascular;  Laterality: Right;  Right Common ILIac   POSTERIOR FUSION CERVICAL SPINE  2004   TEE WITHOUT CARDIOVERSION N/A 04/22/2016   Procedure: TRANSESOPHAGEAL ECHOCARDIOGRAM (TEE);  Surgeon: Dorise MARLA Fellers, MD;  Location: [facility] OR;  Service: Open Heart Surgery;  Laterality: N/A;   TRANSTHORACIC ECHOCARDIOGRAM  08/2013   LVH, EF 55-65%, no WMA.  Moderate mitral regurg    FAMHx:  Family History  Problem Relation Age of Onset   Brain cancer Mother    Diabetes Father    Heart disease Father    Diabetes Brother    Heart disease Brother    Diabetes Son    Hyperlipidemia Son    Coronary artery disease Other        family hx of    SOCHx:   reports that he quit smoking about 15 years ago. His smoking use included cigarettes. He started smoking about 65 years ago. He has a 50 pack-year smoking history. He has never used smokeless tobacco. He reports that he does not drink alcohol and does not use drugs.  ALLERGIES:  Allergies[1]  ROS: Pertinent items noted in HPI and  remainder of comprehensive ROS otherwise negative.  HOME MEDS: Medications Ordered Prior to Encounter[2]  LABS/IMAGING: No results found for this or any previous visit (from the past 48 hours). No results found.  LIPID PANEL:    Component Value Date/Time   CHOL 187 06/27/2019 1056   TRIG 133 06/27/2019 1056   HDL 43 06/27/2019 1056   CHOLHDL 4.3 06/27/2019 1056   CHOLHDL 5 07/02/2014 0824   VLDL 19.8 07/02/2014 0824   LDLCALC 117 (H) 06/27/2019 1056   LDLDIRECT 102.8 03/14/2012 0950    No results found for: LIPOA   WEIGHTS: Wt Readings from Last 3 Encounters:  12/18/24 135 lb 14.4 oz (61.6 kg)  07/29/24 133 lb 3.2 oz (60.4 kg)  05/21/24 129 lb (58.5 kg)    VITALS: BP 120/66   Pulse 67   Ht 5' 6 (1.676 m)   Wt 135 lb 14.4 oz (61.6 kg)   SpO2 93%   BMI 21.93 kg/m   EXAM: Deferred  EKG: Deferred  ASSESSMENT:  Dyslipidemia, goal LDL less than 55 (very high risk) CAD with prior MI PAD with prior peripheral intervention Type 2 diabetes with neuropathy Statin and antibody PCSK9 inhibitor intolerance-myalgias/muscle cramps  PLAN: 1.   Derrick [name] remains above a target LDL less than 55 given substantial cardiovascular disease.  Unfortunately cannot tolerate statins or PCSK9 inhibitors.  There are few other options including Nexletol  and Leqvio.  He is ready on ezetimibe.  After presenting the options he was interested in adding Nexletol .  Will reach out for prior authorization for this.  Ultimately if this is tolerated could be combined with his Zetia.  May not be potent enough to reach his goal and he understands this.  Plan repeat lipids on therapy in about 3 months.  Follow-up with Dr. Court afterwards.  Derrick KYM Maxcy, MD, [facility], FNLA, FACP  [place]  [facility]  Medical Director of the [facility]  Cardiovascular Risk Reduction Clinic Diplomate of the American Board of Clinical Lipidology Attending Cardiologist  Direct Dial:  [phone]  Fax: [phone]  Website:  [email or web address]  Derrick [name] 12/18/2024, 2:40 PM      [1]  Allergies Allergen Reactions   Atorvastatin  Other (See Comments)    MYALGIAS MUSCLE CRAMPS    Lovastatin  Other (See Comments)    MYALGIAS MUSCLE CRAMPS   Pravastatin  Other (See Comments)    MYALGIAS MUSCLE CRAMPS   Simvastatin Other (See Comments)    MYALGIAS MUSCLE CRAMPS   Alirocumab Other (See Comments)    Severe cramps   Evolocumab  Other (See Comments)    Severe cramps  [2]  Current Outpatient Medications on File Prior to Visit  Medication Sig Dispense Refill   aspirin  EC 81 MG tablet Take 1 tablet (81 mg total) by mouth daily.     Blood Glucose Monitoring Suppl (ONE TOUCH ULTRA SYSTEM KIT) W/DEVICE KIT Patient must check sugar TID 1 each 0   clopidogrel  (PLAVIX ) 75 MG tablet Take 1 tablet by mouth once daily 90 tablet 1   ezetimibe (ZETIA) 10 MG tablet Take 1 tablet by mouth daily.     glucose blood (ONE TOUCH ULTRA TEST) test strip USE ONE STRIP TO CHECK GLUCOSE 4 TIMES DAILY AS NEEDED 100 each 12   HYDROcodone -acetaminophen  (NORCO/VICODIN) 5-325 MG tablet Take 1 tablet by mouth every 6 (six) hours as needed for moderate pain. (Patient taking differently: Take 1 tablet by mouth daily.) 40 tablet 0   insulin  lispro (HUMALOG ) 100 UNIT/ML injection up to 100 units via insulin  pump     insulin  lispro (HUMALOG ) 100 UNIT/ML injection SMARTSIG:0-100 Unit(s) SUB-Q As Directed     lisinopril  (PRINIVIL ,ZESTRIL ) 2.5 MG tablet Take 2.5 mg by mouth daily.     metoprolol  tartrate (LOPRESSOR ) 25 MG tablet Take 0.5 tablets (12.5 mg total) by mouth 2 (two) times daily. (Patient taking differently: Take 25 mg by mouth daily.) 90 tablet 1   ONETOUCH DELICA LANCETS 33G MISC USE ONE  4 TIMES DAILY AS NEEDED 100 each 0   pantoprazole  (PROTONIX ) 40 MG tablet Take 1 tablet by mouth once daily 30 tablet 11   REPATHA  SURECLICK 140 MG/ML SOAJ  (Patient not taking: Reported on 12/18/2024)      research study medication Take 1 each by mouth daily. Study drug for cholesterol from Dr. Ryan Hives (Patient not taking: Reported on 12/18/2024)     No current facility-administered medications on file prior to visit.   "

## 2024-12-19 ENCOUNTER — Telehealth: Payer: Self-pay | Admitting: Pharmacy Technician

## 2024-12-19 ENCOUNTER — Other Ambulatory Visit (HOSPITAL_COMMUNITY): Payer: Self-pay

## 2024-12-19 NOTE — Telephone Encounter (Signed)
 Pharmacy Patient Advocate Encounter  Received notification from [employer] that Prior Authorization for nexletol  has been APPROVED from 12/19/24 to 12/04/25   PA #/Case ID/Reference #: [id number]

## 2024-12-19 NOTE — Telephone Encounter (Signed)
" ° °  Pharmacy Patient Advocate Encounter   Received notification from cc chart that prior authorization for nexletol  is required/requested.   Insurance verification completed.   The patient is insured through [facility].   Per test claim: PA required; PA submitted to above mentioned insurance via Latent Key/confirmation #/EOC [id number] Status is pending  "

## 2025-02-26 ENCOUNTER — Ambulatory Visit: Admitting: Cardiovascular Disease
